# Patient Record
Sex: Female | Born: 1940
Health system: Southern US, Community
[De-identification: ages and names within clinical notes are randomized; demographics above are authoritative.]

## PROBLEM LIST (undated history)

## (undated) DIAGNOSIS — F419 Anxiety disorder, unspecified: Secondary | ICD-10-CM

## (undated) DIAGNOSIS — M8580 Other specified disorders of bone density and structure, unspecified site: Secondary | ICD-10-CM

## (undated) DIAGNOSIS — E785 Hyperlipidemia, unspecified: Secondary | ICD-10-CM

## (undated) DIAGNOSIS — G8929 Other chronic pain: Secondary | ICD-10-CM

## (undated) DIAGNOSIS — K219 Gastro-esophageal reflux disease without esophagitis: Secondary | ICD-10-CM

## (undated) DIAGNOSIS — I219 Acute myocardial infarction, unspecified: Secondary | ICD-10-CM

## (undated) DIAGNOSIS — F32A Depression, unspecified: Secondary | ICD-10-CM

## (undated) DIAGNOSIS — M549 Dorsalgia, unspecified: Secondary | ICD-10-CM

## (undated) DIAGNOSIS — M1990 Unspecified osteoarthritis, unspecified site: Secondary | ICD-10-CM

## (undated) DIAGNOSIS — I10 Essential (primary) hypertension: Secondary | ICD-10-CM

## (undated) DIAGNOSIS — F329 Major depressive disorder, single episode, unspecified: Secondary | ICD-10-CM

## (undated) DIAGNOSIS — I2510 Atherosclerotic heart disease of native coronary artery without angina pectoris: Secondary | ICD-10-CM

## (undated) HISTORY — DX: Atherosclerotic heart disease of native coronary artery without angina pectoris: I25.10

## (undated) HISTORY — DX: Other chronic pain: G89.29

## (undated) HISTORY — DX: Dorsalgia, unspecified: M54.9

## (undated) HISTORY — DX: Gastro-esophageal reflux disease without esophagitis: K21.9

## (undated) HISTORY — DX: Unspecified osteoarthritis, unspecified site: M19.90

## (undated) HISTORY — PX: ECTOPIC PREGNANCY SURGERY: SHX613

## (undated) HISTORY — DX: Essential (primary) hypertension: I10

## (undated) HISTORY — PX: OTHER SURGICAL HISTORY: SHX169

## (undated) HISTORY — DX: Acute myocardial infarction, unspecified: I21.9

## (undated) HISTORY — DX: Hyperlipidemia, unspecified: E78.5

## (undated) HISTORY — DX: Other specified disorders of bone density and structure, unspecified site: M85.80

---
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
## 1998-08-04 ENCOUNTER — Other Ambulatory Visit: Admission: RE | Admit: 1998-08-04 | Discharge: 1998-08-04 | Payer: Self-pay

## 1999-09-07 ENCOUNTER — Other Ambulatory Visit: Admission: RE | Admit: 1999-09-07 | Discharge: 1999-09-07 | Payer: Self-pay | Admitting: Family Medicine

## 2001-07-14 ENCOUNTER — Other Ambulatory Visit: Admission: RE | Admit: 2001-07-14 | Discharge: 2001-07-14 | Payer: Self-pay | Admitting: Family Medicine

## 2003-01-28 ENCOUNTER — Encounter: Payer: Self-pay | Admitting: Family Medicine

## 2003-01-28 ENCOUNTER — Ambulatory Visit (HOSPITAL_COMMUNITY): Admission: RE | Admit: 2003-01-28 | Discharge: 2003-01-28 | Payer: Self-pay | Admitting: Family Medicine

## 2006-03-20 ENCOUNTER — Other Ambulatory Visit: Admission: RE | Admit: 2006-03-20 | Discharge: 2006-03-20 | Payer: Self-pay | Admitting: Family Medicine

## 2010-06-06 ENCOUNTER — Emergency Department (HOSPITAL_COMMUNITY)
Admission: EM | Admit: 2010-06-06 | Discharge: 2010-06-06 | Disposition: A | Discharge status: Home / Self Care | Payer: Medicare Other | Attending: Emergency Medicine | Admitting: Emergency Medicine

## 2010-06-06 DIAGNOSIS — Z203 Contact with and (suspected) exposure to rabies: Secondary | ICD-10-CM | POA: Insufficient documentation

## 2010-06-06 DIAGNOSIS — Z23 Encounter for immunization: Secondary | ICD-10-CM | POA: Insufficient documentation

## 2010-06-06 DIAGNOSIS — Z79899 Other long term (current) drug therapy: Secondary | ICD-10-CM | POA: Insufficient documentation

## 2010-06-06 DIAGNOSIS — I252 Old myocardial infarction: Secondary | ICD-10-CM | POA: Insufficient documentation

## 2010-06-06 DIAGNOSIS — I10 Essential (primary) hypertension: Secondary | ICD-10-CM | POA: Insufficient documentation

## 2010-06-06 DIAGNOSIS — S51809A Unspecified open wound of unspecified forearm, initial encounter: Secondary | ICD-10-CM | POA: Insufficient documentation

## 2010-06-06 DIAGNOSIS — W540XXA Bitten by dog, initial encounter: Secondary | ICD-10-CM | POA: Insufficient documentation

## 2010-06-09 ENCOUNTER — Ambulatory Visit (HOSPITAL_COMMUNITY): Payer: PRIVATE HEALTH INSURANCE

## 2010-06-09 ENCOUNTER — Encounter (HOSPITAL_COMMUNITY): Payer: Medicare Other | Attending: Oncology

## 2010-06-09 DIAGNOSIS — Z23 Encounter for immunization: Secondary | ICD-10-CM | POA: Insufficient documentation

## 2010-06-09 DIAGNOSIS — Z203 Contact with and (suspected) exposure to rabies: Secondary | ICD-10-CM | POA: Insufficient documentation

## 2010-06-13 ENCOUNTER — Ambulatory Visit (HOSPITAL_COMMUNITY): Payer: Medicare Other

## 2010-06-20 ENCOUNTER — Ambulatory Visit (HOSPITAL_COMMUNITY): Payer: Medicare Other

## 2011-05-23 DIAGNOSIS — Z1212 Encounter for screening for malignant neoplasm of rectum: Secondary | ICD-10-CM | POA: Diagnosis not present

## 2011-06-20 DIAGNOSIS — I10 Essential (primary) hypertension: Secondary | ICD-10-CM | POA: Diagnosis not present

## 2011-06-20 DIAGNOSIS — Z5189 Encounter for other specified aftercare: Secondary | ICD-10-CM | POA: Diagnosis not present

## 2011-06-20 DIAGNOSIS — Z9889 Other specified postprocedural states: Secondary | ICD-10-CM | POA: Diagnosis not present

## 2011-06-20 DIAGNOSIS — I252 Old myocardial infarction: Secondary | ICD-10-CM | POA: Diagnosis not present

## 2011-06-20 DIAGNOSIS — I2510 Atherosclerotic heart disease of native coronary artery without angina pectoris: Secondary | ICD-10-CM | POA: Diagnosis not present

## 2011-06-20 DIAGNOSIS — E785 Hyperlipidemia, unspecified: Secondary | ICD-10-CM | POA: Diagnosis not present

## 2011-07-04 DIAGNOSIS — M76899 Other specified enthesopathies of unspecified lower limb, excluding foot: Secondary | ICD-10-CM | POA: Diagnosis not present

## 2011-07-24 DIAGNOSIS — E785 Hyperlipidemia, unspecified: Secondary | ICD-10-CM | POA: Diagnosis not present

## 2011-07-24 DIAGNOSIS — E559 Vitamin D deficiency, unspecified: Secondary | ICD-10-CM | POA: Diagnosis not present

## 2011-07-24 DIAGNOSIS — I10 Essential (primary) hypertension: Secondary | ICD-10-CM | POA: Diagnosis not present

## 2011-08-01 DIAGNOSIS — M76899 Other specified enthesopathies of unspecified lower limb, excluding foot: Secondary | ICD-10-CM | POA: Diagnosis not present

## 2011-09-12 DIAGNOSIS — M76899 Other specified enthesopathies of unspecified lower limb, excluding foot: Secondary | ICD-10-CM | POA: Diagnosis not present

## 2011-10-28 DIAGNOSIS — E785 Hyperlipidemia, unspecified: Secondary | ICD-10-CM | POA: Diagnosis not present

## 2011-10-28 DIAGNOSIS — I10 Essential (primary) hypertension: Secondary | ICD-10-CM | POA: Diagnosis not present

## 2011-10-28 DIAGNOSIS — E559 Vitamin D deficiency, unspecified: Secondary | ICD-10-CM | POA: Diagnosis not present

## 2012-01-06 DIAGNOSIS — K219 Gastro-esophageal reflux disease without esophagitis: Secondary | ICD-10-CM | POA: Diagnosis not present

## 2012-01-06 DIAGNOSIS — R17 Unspecified jaundice: Secondary | ICD-10-CM | POA: Diagnosis not present

## 2012-01-06 DIAGNOSIS — J029 Acute pharyngitis, unspecified: Secondary | ICD-10-CM | POA: Diagnosis not present

## 2012-01-22 DIAGNOSIS — J342 Deviated nasal septum: Secondary | ICD-10-CM | POA: Diagnosis not present

## 2012-01-22 DIAGNOSIS — R1310 Dysphagia, unspecified: Secondary | ICD-10-CM | POA: Diagnosis not present

## 2012-01-22 DIAGNOSIS — K219 Gastro-esophageal reflux disease without esophagitis: Secondary | ICD-10-CM | POA: Diagnosis not present

## 2012-01-22 DIAGNOSIS — R040 Epistaxis: Secondary | ICD-10-CM | POA: Diagnosis not present

## 2012-01-31 DIAGNOSIS — I10 Essential (primary) hypertension: Secondary | ICD-10-CM | POA: Diagnosis not present

## 2012-01-31 DIAGNOSIS — I2510 Atherosclerotic heart disease of native coronary artery without angina pectoris: Secondary | ICD-10-CM | POA: Diagnosis not present

## 2012-01-31 DIAGNOSIS — E785 Hyperlipidemia, unspecified: Secondary | ICD-10-CM | POA: Diagnosis not present

## 2012-01-31 DIAGNOSIS — E559 Vitamin D deficiency, unspecified: Secondary | ICD-10-CM | POA: Diagnosis not present

## 2012-05-05 DIAGNOSIS — I2510 Atherosclerotic heart disease of native coronary artery without angina pectoris: Secondary | ICD-10-CM | POA: Diagnosis not present

## 2012-05-05 DIAGNOSIS — E785 Hyperlipidemia, unspecified: Secondary | ICD-10-CM | POA: Diagnosis not present

## 2012-05-05 DIAGNOSIS — I10 Essential (primary) hypertension: Secondary | ICD-10-CM | POA: Diagnosis not present

## 2012-05-25 DIAGNOSIS — J342 Deviated nasal septum: Secondary | ICD-10-CM | POA: Diagnosis not present

## 2012-05-25 DIAGNOSIS — R040 Epistaxis: Secondary | ICD-10-CM | POA: Diagnosis not present

## 2012-05-27 DIAGNOSIS — Z5320 Procedure and treatment not carried out because of patient's decision for unspecified reasons: Secondary | ICD-10-CM | POA: Diagnosis not present

## 2012-05-27 DIAGNOSIS — R040 Epistaxis: Secondary | ICD-10-CM | POA: Diagnosis not present

## 2012-06-09 DIAGNOSIS — Z1212 Encounter for screening for malignant neoplasm of rectum: Secondary | ICD-10-CM | POA: Diagnosis not present

## 2012-07-05 ENCOUNTER — Other Ambulatory Visit: Payer: Self-pay | Admitting: *Deleted

## 2012-07-05 DIAGNOSIS — M8580 Other specified disorders of bone density and structure, unspecified site: Secondary | ICD-10-CM

## 2012-07-22 DIAGNOSIS — I498 Other specified cardiac arrhythmias: Secondary | ICD-10-CM | POA: Diagnosis not present

## 2012-07-22 DIAGNOSIS — I252 Old myocardial infarction: Secondary | ICD-10-CM | POA: Diagnosis not present

## 2012-07-22 DIAGNOSIS — E785 Hyperlipidemia, unspecified: Secondary | ICD-10-CM | POA: Diagnosis not present

## 2012-07-22 DIAGNOSIS — I2510 Atherosclerotic heart disease of native coronary artery without angina pectoris: Secondary | ICD-10-CM | POA: Diagnosis not present

## 2012-07-22 DIAGNOSIS — I119 Hypertensive heart disease without heart failure: Secondary | ICD-10-CM | POA: Diagnosis not present

## 2012-07-22 DIAGNOSIS — I10 Essential (primary) hypertension: Secondary | ICD-10-CM | POA: Diagnosis not present

## 2012-07-31 ENCOUNTER — Other Ambulatory Visit: Payer: Self-pay | Admitting: *Deleted

## 2012-07-31 MED ORDER — HYDROCODONE-ACETAMINOPHEN 5-325 MG PO TABS: ORAL_TABLET | ORAL | Status: DC

## 2012-07-31 NOTE — Telephone Encounter (Signed)
RX ready for pick up but cannot be filled till 08/07/12

## 2012-07-31 NOTE — Telephone Encounter (Signed)
PT AWARE RX UP FRONT

## 2012-07-31 NOTE — Telephone Encounter (Signed)
Patient last seen on 05-05-12. Rx last filled on 07-08-12. Please advise. If approved please print and have pt pick up. Pts number is [phone]. Thank you

## 2012-08-04 ENCOUNTER — Ambulatory Visit: Payer: Self-pay | Admitting: Nurse Practitioner

## 2012-08-10 ENCOUNTER — Encounter: Payer: Self-pay | Admitting: Nurse Practitioner

## 2012-08-10 ENCOUNTER — Ambulatory Visit (INDEPENDENT_AMBULATORY_CARE_PROVIDER_SITE_OTHER): Payer: Medicare Other | Admitting: Nurse Practitioner

## 2012-08-10 ENCOUNTER — Encounter: Payer: Self-pay | Admitting: *Deleted

## 2012-08-10 VITALS — BP 141/81 | HR 57 | Temp 98.8°F | Ht 65.0 in | Wt 165.5 lb

## 2012-08-10 DIAGNOSIS — F329 Major depressive disorder, single episode, unspecified: Secondary | ICD-10-CM | POA: Diagnosis not present

## 2012-08-10 DIAGNOSIS — E785 Hyperlipidemia, unspecified: Secondary | ICD-10-CM | POA: Diagnosis not present

## 2012-08-10 DIAGNOSIS — K219 Gastro-esophageal reflux disease without esophagitis: Secondary | ICD-10-CM | POA: Diagnosis not present

## 2012-08-10 DIAGNOSIS — I10 Essential (primary) hypertension: Secondary | ICD-10-CM

## 2012-08-10 LAB — COMPLETE METABOLIC PANEL WITH GFR
Albumin: 4 g/dL (ref 3.5–5.2)
BUN: 13 mg/dL (ref 6–23)
Calcium: 9.5 mg/dL (ref 8.4–10.5)
Chloride: 105 mEq/L (ref 96–112)
Creat: 0.76 mg/dL (ref 0.50–1.10)
GFR, Est Non African American: 80 mL/min
Glucose, Bld: 95 mg/dL (ref 70–99)
Potassium: 4.1 mEq/L (ref 3.5–5.3)

## 2012-08-10 NOTE — Progress Notes (Signed)
Subjective:    Patient ID: Teresa Holloway, female    DOB: Dec 16, 1940, 72 y.o.   MRN: [id number]  Hypertension This is a chronic problem. The current episode started more than 1 year ago. The problem is unchanged. The problem is controlled. Pertinent negatives include no blurred vision, chest pain, headaches, palpitations, peripheral edema or shortness of breath. There are no associated agents to hypertension. Risk factors for coronary artery disease include dyslipidemia, family history and post-menopausal state. Past treatments include calcium channel blockers, ACE inhibitors and beta blockers. The current treatment provides moderate improvement. Compliance problems include exercise.  Hypertensive end-organ damage includes CAD/MI (2004 and 2007).  Hyperlipidemia This is a chronic problem. The current episode started more than 1 year ago. The problem is controlled. Recent lipid tests were reviewed and are normal. Pertinent negatives include no chest pain, focal weakness, leg pain, myalgias or shortness of breath. Current antihyperlipidemic treatment includes statins. The current treatment provides moderate improvement of lipids. Compliance problems include adherence to exercise.  Risk factors for coronary artery disease include hypertension and post-menopausal.  Gastrophageal Reflux She reports no chest pain, no coughing, no early satiety, no heartburn, no nausea or no sore throat. This is a chronic problem. The current episode started more than 1 year ago. The problem occurs rarely. The problem has been resolved. The symptoms are aggravated by caffeine. Pertinent negatives include no fatigue, orthopnea or weight loss. There are no known risk factors. She has tried a PPI for the symptoms. The treatment provided no relief.  Depression Currently doing well on Paxil. Stays active and on the go all the time. Denies any crying episodes. Hx CAD/MI On plavix- denies any bleeding problems. See Cardiologist  yearly   Review of Systems  Constitutional: Negative for weight loss and fatigue.  HENT: Negative for sore throat.   Eyes: Negative for blurred vision.  Respiratory: Negative for cough and shortness of breath.   Cardiovascular: Negative for chest pain and palpitations.  Gastrointestinal: Negative for heartburn and nausea.  Musculoskeletal: Negative for myalgias.  Neurological: Negative for focal weakness and headaches.       Objective:   Physical Exam  Constitutional: She is oriented to person, place, and time. She appears well-developed and well-nourished.  HENT:  Nose: Nose normal.  Mouth/Throat: Oropharynx is clear and moist.  Eyes: EOM are normal.  Neck: Trachea normal, normal range of motion and full passive range of motion without pain. Neck supple. No JVD present. Carotid bruit is not present. No thyromegaly present.  Cardiovascular: Normal rate, regular rhythm, normal heart sounds and intact distal pulses.  Exam reveals no gallop and no friction rub.   No murmur heard. Pulmonary/Chest: Effort normal and breath sounds normal.  Abdominal: Soft. Bowel sounds are normal. She exhibits no distension and no mass. There is no tenderness.  Musculoskeletal: Normal range of motion.  Lymphadenopathy:    She has no cervical adenopathy.  Neurological: She is alert and oriented to person, place, and time. She has normal reflexes.  Skin: Skin is warm and dry.  Psychiatric: She has a normal mood and affect. Her behavior is normal. Judgment and thought content normal.   BP 141/81  Pulse 57  Temp(Src) 98.8 F (37.1 C) (Oral)  Ht 5\' 5"  (1.651 m)  Wt 165 lb 8 oz (75.07 kg)  BMI 27.54 kg/m2        Assessment & Plan:  1. Essential hypertension, benign Low NA+ diet  COMPLETE METABOLIC PANEL WITH GFR  2. Hyperlipidemia with  target LDL less than 100 Low fat diet and exercise - NMR Lipoprofile with Lipids  3. GERD (gastroesophageal reflux disease) Avoid spicy and fatty foods when  symptomatic  4. Depression Stress management discuessed  Continue all meds Will call with lab results Mary-Margaret Daphine Deutscher, FNP

## 2012-08-10 NOTE — Patient Instructions (Signed)

## 2012-08-11 LAB — NMR LIPOPROFILE WITH LIPIDS
HDL Size: 9.8 nm (ref >=9.2)
LDL (calc): 60 mg/dL (ref <100)
LDL Particle Number: 688 nmol/L (ref <1000)
LDL Size: 20.4 nm — ABNORMAL LOW (ref >20.5)
LP-IR Score: <25 (ref <=45)
Large VLDL-P: <0.8 nmol/L (ref <=2.7)
Small LDL Particle Number: 259 nmol/L (ref <=527)
VLDL Size: 44.7 nm (ref <=46.6)

## 2012-08-19 ENCOUNTER — Ambulatory Visit (INDEPENDENT_AMBULATORY_CARE_PROVIDER_SITE_OTHER): Payer: Medicare Other

## 2012-08-19 ENCOUNTER — Ambulatory Visit (INDEPENDENT_AMBULATORY_CARE_PROVIDER_SITE_OTHER): Payer: Medicare Other | Admitting: Pharmacist

## 2012-08-19 VITALS — Ht 64.5 in | Wt 169.0 lb

## 2012-08-19 DIAGNOSIS — I2510 Atherosclerotic heart disease of native coronary artery without angina pectoris: Secondary | ICD-10-CM | POA: Diagnosis not present

## 2012-08-19 DIAGNOSIS — M949 Disorder of cartilage, unspecified: Secondary | ICD-10-CM

## 2012-08-19 DIAGNOSIS — I252 Old myocardial infarction: Secondary | ICD-10-CM | POA: Diagnosis not present

## 2012-08-19 DIAGNOSIS — M899 Disorder of bone, unspecified: Secondary | ICD-10-CM | POA: Diagnosis not present

## 2012-08-19 DIAGNOSIS — M1610 Unilateral primary osteoarthritis, unspecified hip: Secondary | ICD-10-CM

## 2012-08-19 DIAGNOSIS — M170 Bilateral primary osteoarthritis of knee: Secondary | ICD-10-CM | POA: Insufficient documentation

## 2012-08-19 DIAGNOSIS — M8580 Other specified disorders of bone density and structure, unspecified site: Secondary | ICD-10-CM

## 2012-08-19 DIAGNOSIS — E785 Hyperlipidemia, unspecified: Secondary | ICD-10-CM

## 2012-08-19 NOTE — Progress Notes (Signed)
Patient ID: Teresa Holloway, female   DOB: 1941-01-30, 72 y.o.   MRN: [id number]  [facility] Current Height: Height: 5' 4.5" (163.8 cm)      Max Lifetime Height:  5\' 6"  Current Weight: Weight: 169 lb (76.658 kg)       Ethnicity:Caucasian     HPI: Does pt already have a diagnosis of:  Osteopenia?  Yes Osteoporosis?  No  Back Pain?  Yes       Kyphosis?  No Prior fracture?  Yes - wrist 2-3 years ago Med(s) for Osteoporosis/Osteopenia:  none Med(s) previously tried for Osteoporosis/Osteopenia:  actonel - patient didn't continue to take because of concern for ONJ risk.  Patient also reports difficulty swallowing her glucosamine/chondroitin and has discontinued.                                                             PMH: Age at menopause:  72 years old Hysterectomy?  No Oophorectomy?  No HRT? Yes - Former.  Type/duration: premarin - for 5 to 6 years Steroid Use?  No Thyroid med?  No History of cancer?  No History of digestive disorders (ie Crohn's)?  No Current or previous eating disorders?  Yes - takes zantac 75mg  daily for reflux Last Vitamin D Result:  44 (01/31/2012) Last GFR Result:  80 (08/10/2012)   FH/SH: Family history of osteoporosis?  Yes  - brother Parent with history of hip fracture?  No Family history of breast cancer?  No Exercise?  Yes - walking daily  Smoking?  No Alcohol?  No    Calcium Assessment Calcium Intake  # of servings/day  Calcium mg  Milk (8 oz) 1  x  300  = 300mg   Yogurt (4 oz) 1 x  200 = 200mg   Cheese (1 oz) 0 x  200 = 0  Other Calcium sources   250mg   Ca supplement none = 0   Estimated calcium intake per day 750mg     DEXA Results Date of Test T-Score for AP Spine L1-L4 T-Score for Total Left Hip T-Score for Total Right Hip  08/09/3012 -1.3 -2.4 -2.3  07/06/2008 -1.0 -2.1 -1.8       08/09/2012** Neck of L hip T-Score = -2.8     Assessment: Osteoporosis with worsening BMD and non compliance with previous recommended  therapy and also has low calcium intake.  Recommendations: 1.  Start  Prolia 60mg  SQ Q 6 months - looking into insurance coverage.  Ins determination sent today - pending 2.  recommend calcium 1200mg  daily either through supplementation   or diet.   3.  recommend weight bearing exercise - 30 minutes at least 4 days   per week.   4.  Counseled and educated about fall risk and prevention.  Recheck DEXA:  2 years  Time spent counseling patient:  30 minutes

## 2012-08-19 NOTE — Patient Instructions (Addendum)

## 2012-08-25 ENCOUNTER — Other Ambulatory Visit: Payer: Self-pay | Admitting: Pharmacist

## 2012-08-25 ENCOUNTER — Telehealth: Payer: Self-pay | Admitting: Pharmacist

## 2012-08-25 NOTE — Telephone Encounter (Signed)
Yes I will order it today and let you know when it arrives

## 2012-08-25 NOTE — Progress Notes (Signed)
Patient ID: Teresa Holloway, female   DOB: 04/27/1940, 72 y.o.   MRN: [id number]  Received Summary of Benefits from [employer] - Prolia would be covered at 100% with no out of pocket for patient.  Will notify patient, order medication and set up appointment for administration of Prolia.

## 2012-08-25 NOTE — Telephone Encounter (Signed)
Patient notified that Prolia is covered 100% by insurance per [employer].  Appt schedule for administration 09/09/12. Will get Ardine Eng to order.

## 2012-08-27 NOTE — Telephone Encounter (Signed)
Prolia has arrived and in the lab refridgerator

## 2012-09-02 ENCOUNTER — Other Ambulatory Visit: Payer: Self-pay | Admitting: Nurse Practitioner

## 2012-09-03 NOTE — Telephone Encounter (Signed)
rx ready for pickup 

## 2012-09-03 NOTE — Telephone Encounter (Signed)
Last seen 08/10/12, last filled 08/07/12. call [phone] for pickup

## 2012-09-04 ENCOUNTER — Telehealth: Payer: Self-pay | Admitting: Nurse Practitioner

## 2012-09-04 NOTE — Telephone Encounter (Signed)
Up front  - pt aware  

## 2012-09-04 NOTE — Telephone Encounter (Signed)
LMOM at [facility] and with pt number, RX here ready for pk up.

## 2012-09-09 ENCOUNTER — Ambulatory Visit (INDEPENDENT_AMBULATORY_CARE_PROVIDER_SITE_OTHER): Payer: Medicare Other | Admitting: Pharmacist

## 2012-09-09 DIAGNOSIS — E559 Vitamin D deficiency, unspecified: Secondary | ICD-10-CM

## 2012-09-09 DIAGNOSIS — M810 Age-related osteoporosis without current pathological fracture: Secondary | ICD-10-CM | POA: Diagnosis not present

## 2012-09-09 MED ORDER — DENOSUMAB 60 MG/ML ~~LOC~~ SOLN
60.0000 mg | Freq: Once | SUBCUTANEOUS | Status: AC
  Administered 2012-09-09: 60 mg via SUBCUTANEOUS

## 2012-09-09 NOTE — Progress Notes (Signed)
Patient is here for first Prolia injection.      HPI: Last DEXA showed decreased BMD which now indicated osteoporosis. H/O wrist fracture 2-3 years ago  Med(s) previously tried  actonel but patient didn't continue to take because of concern for ONJ risk. Patient also reports difficulty swallowing her glucosamine/chondroitin and has discontinued.                                                                   DEXA Results Date of Test T-Score for AP Spine L1-L4 T-Score for Total Left Hip T-Score for Total Right Hip  08/09/3012 -1.3 -2.4 -2.3  07/06/2008 -1.0 -2.1 -1.8       08/09/2012** Neck of L hip T-Score = -2.8     Assessment: Osteoporosis   Recommendations: 1.  Prolia 60mg /mL injection given SQ today 2. Continue calcium 1200mg  daily either through supplementation   or diet.   3.  recommend weight bearing exercise - 30 minutes at least 4 days   per week.   4.  Counseled and educated about fall risk and prevention.  Recheck DEXA:  2 years  Time spent counseling patient:  15 minutes

## 2012-10-01 ENCOUNTER — Other Ambulatory Visit: Payer: Self-pay | Admitting: Nurse Practitioner

## 2012-10-05 NOTE — Telephone Encounter (Signed)
Aware. 

## 2012-10-05 NOTE — Telephone Encounter (Signed)
Last seen 08/10/12, last filled 09/02/12. Call pt to pickup rx

## 2012-10-05 NOTE — Telephone Encounter (Signed)
rx ready for pickup 

## 2012-11-02 ENCOUNTER — Telehealth: Payer: Self-pay | Admitting: Nurse Practitioner

## 2012-11-02 ENCOUNTER — Other Ambulatory Visit: Payer: Self-pay | Admitting: Nurse Practitioner

## 2012-11-02 MED ORDER — HYDROCODONE-ACETAMINOPHEN 5-325 MG PO TABS: 1.0000 | ORAL_TABLET | Freq: Four times a day (QID) | ORAL | Status: DC | PRN

## 2012-11-02 NOTE — Telephone Encounter (Signed)
rx ready for pickup 

## 2012-11-02 NOTE — Telephone Encounter (Signed)
Please advise 

## 2012-11-02 NOTE — Telephone Encounter (Signed)
Pt aware - rx up front 

## 2012-11-03 ENCOUNTER — Other Ambulatory Visit: Payer: Self-pay | Admitting: Nurse Practitioner

## 2012-11-17 ENCOUNTER — Ambulatory Visit (INDEPENDENT_AMBULATORY_CARE_PROVIDER_SITE_OTHER): Payer: Medicare Other | Admitting: Nurse Practitioner

## 2012-11-17 ENCOUNTER — Encounter: Payer: Self-pay | Admitting: Nurse Practitioner

## 2012-11-17 VITALS — BP 117/71 | HR 56 | Temp 97.2°F | Ht 64.0 in | Wt 169.0 lb

## 2012-11-17 DIAGNOSIS — E559 Vitamin D deficiency, unspecified: Secondary | ICD-10-CM | POA: Diagnosis not present

## 2012-11-17 DIAGNOSIS — I10 Essential (primary) hypertension: Secondary | ICD-10-CM

## 2012-11-17 DIAGNOSIS — I2510 Atherosclerotic heart disease of native coronary artery without angina pectoris: Secondary | ICD-10-CM

## 2012-11-17 DIAGNOSIS — I252 Old myocardial infarction: Secondary | ICD-10-CM | POA: Diagnosis not present

## 2012-11-17 DIAGNOSIS — M170 Bilateral primary osteoarthritis of knee: Secondary | ICD-10-CM

## 2012-11-17 DIAGNOSIS — M810 Age-related osteoporosis without current pathological fracture: Secondary | ICD-10-CM

## 2012-11-17 DIAGNOSIS — M1610 Unilateral primary osteoarthritis, unspecified hip: Secondary | ICD-10-CM | POA: Diagnosis not present

## 2012-11-17 DIAGNOSIS — E785 Hyperlipidemia, unspecified: Secondary | ICD-10-CM

## 2012-11-17 DIAGNOSIS — K219 Gastro-esophageal reflux disease without esophagitis: Secondary | ICD-10-CM

## 2012-11-17 DIAGNOSIS — F329 Major depressive disorder, single episode, unspecified: Secondary | ICD-10-CM

## 2012-11-17 NOTE — Patient Instructions (Signed)

## 2012-11-17 NOTE — Progress Notes (Signed)
Subjective:    Patient ID: Teresa Holloway, female    DOB: [birth year], 72 y.o.   MRN: [id number]  Hypertension This is a chronic problem. The current episode started more than 1 year ago. The problem has been resolved since onset. The problem is controlled. Associated symptoms include palpitations and peripheral edema. Pertinent negatives include no blurred vision, chest pain, headaches, neck pain or shortness of breath. There are no associated agents to hypertension. Risk factors for coronary artery disease include dyslipidemia and post-menopausal state. Past treatments include calcium channel blockers, ACE inhibitors and beta blockers. The current treatment provides moderate improvement. Compliance problems include diet.  Hypertensive end-organ damage includes CAD/MI.  Hyperlipidemia This is a chronic problem. The current episode started more than 1 year ago. The problem is uncontrolled. Recent lipid tests were reviewed and are high. She has no history of diabetes, hypothyroidism, obesity or nephrotic syndrome. There are no known factors aggravating her hyperlipidemia. Pertinent negatives include no chest pain or shortness of breath. Current antihyperlipidemic treatment includes statins. The current treatment provides moderate improvement of lipids. Compliance problems include adherence to diet.  Risk factors for coronary artery disease include hypertension.  CAD/MI Currently on Imdurr - no C/O chest pain or SOB or Dyspnea- Sees Dr. Dorena Cookey sees yearly and just had follow-up in April. Depression Paxil daily  Which is working well for her- no c/o side effects Vitamin D deficiency  Vitamin D OTC- No side effects Osteoarthritis bil knees and hips- takes hydrocodone BID and says that can't walk without it due to the pain. GERD Zantac working well to keep symptoms under control.   Review of Systems  HENT: Negative for neck pain.   Eyes: Negative for blurred vision.  Respiratory: Negative for  shortness of breath.   Cardiovascular: Positive for palpitations. Negative for chest pain.  Neurological: Negative for headaches.  All other systems reviewed and are negative.       Objective:   Physical Exam  Constitutional: She is oriented to person, place, and time. She appears well-developed and well-nourished.  HENT:  Nose: Nose normal.  Mouth/Throat: Oropharynx is clear and moist.  Eyes: EOM are normal.  Neck: Trachea normal, normal range of motion and full passive range of motion without pain. Neck supple. No JVD present. Carotid bruit is not present. No thyromegaly present.  Cardiovascular: Normal rate, regular rhythm, normal heart sounds and intact distal pulses.  Exam reveals no gallop and no friction rub.   No murmur heard. Pulmonary/Chest: Effort normal and breath sounds normal.  Abdominal: Soft. Bowel sounds are normal. She exhibits no distension and no mass. There is no tenderness.  Musculoskeletal: Normal range of motion.  Lymphadenopathy:    She has no cervical adenopathy.  Neurological: She is alert and oriented to person, place, and time. She has normal reflexes.  Skin: Skin is warm and dry.  Psychiatric: She has a normal mood and affect. Her behavior is normal. Judgment and thought content normal.     BP 117/71  Pulse 56  Temp(Src) 97.2 F (36.2 C) (Oral)  Ht 5\' 4"  (1.626 m)  Wt 169 lb (76.658 kg)  BMI 28.99 kg/m2      Assessment & Plan:   1. Unspecified vitamin D deficiency   2. CAD (coronary artery disease)   3. History of MI (myocardial infarction)   4. Arthritis of hip   5. Arthritis of both knees   6. Osteoporosis   7. Hyperlipidemia with target LDL less than 100   8.  Essential hypertension, benign   9. GERD (gastroesophageal reflux disease)   10. Depression    Orders Placed This Encounter  Procedures  . CMP14+EGFR  . NMR, lipoprofile   Continue all meds Labs pending Diet and exercsie encouraged Reviewed health maintenance- refused  mammo, pneumovax and zostavax. Follow up in 3 months  Mary-Margaret Daphine Deutscher, FNP

## 2012-11-19 LAB — CMP14+EGFR
AST: 9 IU/L (ref 0–40)
Albumin/Globulin Ratio: 1.6 (ref 1.1–2.5)
Albumin: 4 g/dL (ref 3.5–4.8)
Alkaline Phosphatase: 80 IU/L (ref 39–117)
BUN/Creatinine Ratio: 20 (ref 11–26)
BUN: 14 mg/dL (ref 8–27)
CO2: 28 mmol/L (ref 18–29)
Calcium: 8.8 mg/dL (ref 8.6–10.2)
Creatinine, Ser: 0.7 mg/dL (ref 0.57–1.00)
GFR calc non Af Amer: 88 mL/min/{1.73_m2} (ref >59)
Globulin, Total: 2.5 g/dL (ref 1.5–4.5)
Sodium: 141 mmol/L (ref 134–144)

## 2012-11-19 LAB — NMR, LIPOPROFILE
Cholesterol: 135 mg/dL (ref <200)
HDL Cholesterol by NMR: 60 mg/dL (ref >=40)
HDL Particle Number: 37.3 umol/L (ref >=30.5)
LDLC SERPL CALC-MCNC: 56 mg/dL (ref <100)
Small LDL Particle Number: 398 nmol/L (ref <=527)

## 2012-12-02 ENCOUNTER — Other Ambulatory Visit: Payer: Self-pay | Admitting: Nurse Practitioner

## 2012-12-03 DIAGNOSIS — H2510 Age-related nuclear cataract, unspecified eye: Secondary | ICD-10-CM | POA: Diagnosis not present

## 2012-12-03 DIAGNOSIS — H40019 Open angle with borderline findings, low risk, unspecified eye: Secondary | ICD-10-CM | POA: Diagnosis not present

## 2012-12-03 DIAGNOSIS — H268 Other specified cataract: Secondary | ICD-10-CM | POA: Diagnosis not present

## 2012-12-03 DIAGNOSIS — H5210 Myopia, unspecified eye: Secondary | ICD-10-CM | POA: Diagnosis not present

## 2012-12-04 NOTE — Telephone Encounter (Signed)
Last seen 11/17/12, last filled 11/02/12. Will print, call pt to pickup

## 2012-12-04 NOTE — Telephone Encounter (Signed)
rx ready for pickup 

## 2012-12-04 NOTE — Telephone Encounter (Signed)
Up front 

## 2013-01-05 ENCOUNTER — Other Ambulatory Visit: Payer: Self-pay | Admitting: Nurse Practitioner

## 2013-01-06 MED ORDER — HYDROCODONE-ACETAMINOPHEN 5-325 MG PO TABS: ORAL_TABLET | ORAL | Status: DC

## 2013-01-06 NOTE — Telephone Encounter (Signed)
Up front ready to pick up  

## 2013-01-06 NOTE — Telephone Encounter (Signed)
Last seen 11/17/12, last filled 12/02/12

## 2013-01-06 NOTE — Telephone Encounter (Signed)
rx ready for pickup 

## 2013-01-15 ENCOUNTER — Encounter: Payer: Self-pay | Admitting: General Practice

## 2013-01-15 ENCOUNTER — Ambulatory Visit (INDEPENDENT_AMBULATORY_CARE_PROVIDER_SITE_OTHER): Payer: Medicare Other | Admitting: General Practice

## 2013-01-15 VITALS — BP 151/80 | HR 55 | Temp 98.7°F | Ht 64.0 in | Wt 162.0 lb

## 2013-01-15 DIAGNOSIS — N390 Urinary tract infection, site not specified: Secondary | ICD-10-CM

## 2013-01-15 DIAGNOSIS — R350 Frequency of micturition: Secondary | ICD-10-CM

## 2013-01-15 DIAGNOSIS — IMO0001 Reserved for inherently not codable concepts without codable children: Secondary | ICD-10-CM

## 2013-01-15 LAB — POCT UA - MICROSCOPIC ONLY
Bacteria, U Microscopic: NEGATIVE
Casts, Ur, LPF, POC: NEGATIVE
Crystals, Ur, HPF, POC: NEGATIVE
Yeast, UA: NEGATIVE

## 2013-01-15 LAB — POCT URINALYSIS DIPSTICK
Bilirubin, UA: NEGATIVE
Ketones, UA: NEGATIVE
Protein, UA: 100
Spec Grav, UA: 1.01
pH, UA: 7

## 2013-01-15 MED ORDER — CIPROFLOXACIN HCL 500 MG PO TABS: 500.0000 mg | ORAL_TABLET | Freq: Two times a day (BID) | ORAL | Status: DC

## 2013-01-15 NOTE — Patient Instructions (Addendum)
Urinary Tract Infection  Urinary tract infections (UTIs) can develop anywhere along your urinary tract. Your urinary tract is your body's drainage system for removing wastes and extra water. Your urinary tract includes two kidneys, two ureters, a bladder, and a urethra. Your kidneys are a pair of bean-shaped organs. Each kidney is about the size of your fist. They are located below your ribs, one on each side of your spine.  CAUSES  Infections are caused by microbes, which are microscopic organisms, including fungi, viruses, and bacteria. These organisms are so small that they can only be seen through a microscope. Bacteria are the microbes that most commonly cause UTIs.  SYMPTOMS   Symptoms of UTIs may vary by age and gender of the patient and by the location of the infection. Symptoms in young women typically include a frequent and intense urge to urinate and a painful, burning feeling in the bladder or urethra during urination. Older women and men are more likely to be tired, shaky, and weak and have muscle aches and abdominal pain. A fever may mean the infection is in your kidneys. Other symptoms of a kidney infection include pain in your back or sides below the ribs, nausea, and vomiting.  DIAGNOSIS  To diagnose a UTI, your caregiver will ask you about your symptoms. Your caregiver also will ask to provide a urine sample. The urine sample will be tested for bacteria and white blood cells. White blood cells are made by your body to help fight infection.  TREATMENT   Typically, UTIs can be treated with medication. Because most UTIs are caused by a bacterial infection, they usually can be treated with the use of antibiotics. The choice of antibiotic and length of treatment depend on your symptoms and the type of bacteria causing your infection.  HOME CARE INSTRUCTIONS   If you were prescribed antibiotics, take them exactly as your caregiver instructs you. Finish the medication even if you feel better after you  have only taken some of the medication.   Drink enough water and fluids to keep your urine clear or pale yellow.   Avoid caffeine, tea, and carbonated beverages. They tend to irritate your bladder.   Empty your bladder often. Avoid holding urine for long periods of time.   Empty your bladder before and after sexual intercourse.   After a bowel movement, women should cleanse from front to back. Use each tissue only once.  SEEK MEDICAL CARE IF:    You have back pain.   You develop a fever.   Your symptoms do not begin to resolve within 3 days.  SEEK IMMEDIATE MEDICAL CARE IF:    You have severe back pain or lower abdominal pain.   You develop chills.   You have nausea or vomiting.   You have continued burning or discomfort with urination.  MAKE SURE YOU:    Understand these instructions.   Will watch your condition.   Will get help right away if you are not doing well or get worse.  Document Released: 01/16/2005 Document Revised: 10/08/2011 Document Reviewed: 05/17/2011  [employer] Patient Information 2014 [employer]

## 2013-01-15 NOTE — Progress Notes (Signed)
Subjective:    Patient ID: Teresa Holloway, female    DOB: 1940/11/19, 72 y.o.   MRN: [id number]  Urinary Tract Infection  This is a new problem. The current episode started in the past 7 days. The problem occurs every urination. The problem has been gradually worsening. The quality of the pain is described as aching and burning. There has been no fever. She is sexually active. There is no history of pyelonephritis. Associated symptoms include flank pain, frequency and urgency. Pertinent negatives include no chills, hematuria, nausea or vomiting. She has tried nothing for the symptoms. There is no history of kidney stones or recurrent UTIs.      Review of Systems  Constitutional: Negative for fever and chills.  Cardiovascular: Negative for chest pain and palpitations.  Gastrointestinal: Positive for abdominal pain. Negative for nausea, vomiting and blood in stool.  Genitourinary: Positive for dysuria, urgency, frequency and flank pain. Negative for hematuria and difficulty urinating.  Skin: Negative for rash.  Neurological: Negative for dizziness, weakness and headaches.       Objective:   Physical Exam  Constitutional: She is oriented to person, place, and time. She appears well-developed and well-nourished.  Cardiovascular: Normal rate, regular rhythm and normal heart sounds.   Pulmonary/Chest: Effort normal and breath sounds normal.  Abdominal: Soft. Bowel sounds are normal. She exhibits no distension. There is tenderness in the suprapubic area. There is no CVA tenderness.  Neurological: She is alert and oriented to person, place, and time.  Skin: Skin is warm and dry.  Psychiatric: She has a normal mood and affect.    Results for orders placed in visit on 01/15/13  POCT URINALYSIS DIPSTICK      Result Value Range   Color, UA yellow     Clarity, UA cloudy     Glucose, UA neg     Bilirubin, UA neg     Ketones, UA neg     Spec Grav, UA 1.010     Blood, UA large     pH, UA  7.0     Protein, UA 100     Urobilinogen, UA negative     Nitrite, UA neg     Leukocytes, UA large (3+)    POCT UA - MICROSCOPIC ONLY      Result Value Range   WBC, Ur, HPF, POC 75-100     RBC, urine, microscopic 20-25     Bacteria, U Microscopic neg     Mucus, UA occ     Epithelial cells, urine per micros occ     Crystals, Ur, HPF, POC neg     Casts, Ur, LPF, POC neg     Yeast, UA neg           Assessment & Plan:  1. Frequency - POCT urinalysis dipstick - POCT UA - Microscopic Only  2. UTI (urinary tract infection) - ciprofloxacin (CIPRO) 500 MG tablet; Take 1 tablet (500 mg total) by mouth 2 (two) times daily.  Dispense: 20 tablet; Refill: 0 -Increase fluid intake AZO over the counter X2 days Frequent voiding Proper perineal hygiene RTO prn Patient verbalized understanding Coralie Keens, FNP-C

## 2013-01-17 LAB — URINE CULTURE

## 2013-01-18 ENCOUNTER — Other Ambulatory Visit: Payer: Self-pay | Admitting: Nurse Practitioner

## 2013-02-03 ENCOUNTER — Other Ambulatory Visit: Payer: Self-pay | Admitting: Nurse Practitioner

## 2013-02-04 MED ORDER — HYDROCODONE-ACETAMINOPHEN 5-325 MG PO TABS: ORAL_TABLET | ORAL | Status: DC

## 2013-02-04 NOTE — Telephone Encounter (Signed)
Last filled 01/06/13, last seen 11/17/12. Rx will print call her when ready

## 2013-02-04 NOTE — Telephone Encounter (Signed)
rx ready for pickup 

## 2013-02-05 NOTE — Telephone Encounter (Signed)
Up front patient aware 

## 2013-02-17 DIAGNOSIS — H401490 Capsular glaucoma with pseudoexfoliation of lens, unspecified eye, stage unspecified: Secondary | ICD-10-CM | POA: Diagnosis not present

## 2013-02-17 DIAGNOSIS — H409 Unspecified glaucoma: Secondary | ICD-10-CM | POA: Diagnosis not present

## 2013-02-23 ENCOUNTER — Ambulatory Visit: Payer: PRIVATE HEALTH INSURANCE | Admitting: Nurse Practitioner

## 2013-03-08 ENCOUNTER — Telehealth: Payer: Self-pay | Admitting: Nurse Practitioner

## 2013-03-08 ENCOUNTER — Telehealth: Payer: Self-pay | Admitting: Pharmacist

## 2013-03-08 MED ORDER — HYDROCODONE-ACETAMINOPHEN 5-325 MG PO TABS: ORAL_TABLET | ORAL | Status: DC

## 2013-03-08 NOTE — Telephone Encounter (Signed)
Patient aware rx ready to pick up 

## 2013-03-08 NOTE — Telephone Encounter (Signed)
rx ready for pickup 

## 2013-03-15 NOTE — Telephone Encounter (Signed)
Patient is concerned about ONJ with Prolia.  She has appt with her dentist 12/15 /14 and would like to discuss with him.  Will follow up with patient after that appt.

## 2013-04-05 ENCOUNTER — Telehealth: Payer: Self-pay | Admitting: Nurse Practitioner

## 2013-04-05 MED ORDER — HYDROCODONE-ACETAMINOPHEN 5-325 MG PO TABS: ORAL_TABLET | ORAL | Status: DC

## 2013-04-05 NOTE — Telephone Encounter (Signed)
rx ready for pickup 

## 2013-04-06 NOTE — Telephone Encounter (Signed)
Patient aware to come pick up 

## 2013-04-19 ENCOUNTER — Other Ambulatory Visit: Payer: Self-pay | Admitting: Nurse Practitioner

## 2013-05-05 ENCOUNTER — Telehealth: Payer: Self-pay | Admitting: Nurse Practitioner

## 2013-05-05 MED ORDER — HYDROCODONE-ACETAMINOPHEN 5-325 MG PO TABS: ORAL_TABLET | ORAL | Status: DC

## 2013-05-05 NOTE — Telephone Encounter (Signed)
rx ready for pickup 

## 2013-05-05 NOTE — Telephone Encounter (Signed)
Pt notified rx ready for pick up.

## 2013-05-20 DIAGNOSIS — H401490 Capsular glaucoma with pseudoexfoliation of lens, unspecified eye, stage unspecified: Secondary | ICD-10-CM | POA: Diagnosis not present

## 2013-05-20 DIAGNOSIS — H409 Unspecified glaucoma: Secondary | ICD-10-CM | POA: Diagnosis not present

## 2013-05-26 ENCOUNTER — Ambulatory Visit (INDEPENDENT_AMBULATORY_CARE_PROVIDER_SITE_OTHER): Payer: Medicare Other | Admitting: Nurse Practitioner

## 2013-05-26 ENCOUNTER — Encounter: Payer: Self-pay | Admitting: Nurse Practitioner

## 2013-05-26 VITALS — BP 126/56 | HR 55 | Temp 99.0°F | Ht 64.0 in | Wt 174.0 lb

## 2013-05-26 DIAGNOSIS — I10 Essential (primary) hypertension: Secondary | ICD-10-CM

## 2013-05-26 DIAGNOSIS — M899 Disorder of bone, unspecified: Secondary | ICD-10-CM

## 2013-05-26 DIAGNOSIS — K219 Gastro-esophageal reflux disease without esophagitis: Secondary | ICD-10-CM

## 2013-05-26 DIAGNOSIS — R350 Frequency of micturition: Secondary | ICD-10-CM

## 2013-05-26 DIAGNOSIS — I2510 Atherosclerotic heart disease of native coronary artery without angina pectoris: Secondary | ICD-10-CM

## 2013-05-26 DIAGNOSIS — E785 Hyperlipidemia, unspecified: Secondary | ICD-10-CM | POA: Diagnosis not present

## 2013-05-26 DIAGNOSIS — M8580 Other specified disorders of bone density and structure, unspecified site: Secondary | ICD-10-CM

## 2013-05-26 DIAGNOSIS — M949 Disorder of cartilage, unspecified: Secondary | ICD-10-CM

## 2013-05-26 LAB — POCT URINALYSIS DIPSTICK
BILIRUBIN UA: NEGATIVE
Glucose, UA: NEGATIVE
KETONES UA: NEGATIVE
Nitrite, UA: NEGATIVE
Spec Grav, UA: 1.01
Urobilinogen, UA: NEGATIVE
pH, UA: 8

## 2013-05-26 LAB — POCT UA - MICROSCOPIC ONLY
Casts, Ur, LPF, POC: NEGATIVE
Crystals, Ur, HPF, POC: NEGATIVE
MUCUS UA: NEGATIVE
YEAST UA: NEGATIVE

## 2013-05-26 MED ORDER — CIPROFLOXACIN HCL 500 MG PO TABS: 500.0000 mg | ORAL_TABLET | Freq: Two times a day (BID) | ORAL | Status: DC

## 2013-05-26 MED ORDER — PAROXETINE HCL 40 MG PO TABS: ORAL_TABLET | ORAL | Status: DC

## 2013-05-26 NOTE — Patient Instructions (Signed)
Health Maintenance, Female A healthy lifestyle and preventative care can promote health and wellness.  Maintain regular health, dental, and eye exams.  Eat a healthy diet. Foods like vegetables, fruits, whole grains, low-fat dairy products, and lean protein foods contain the nutrients you need without too many calories. Decrease your intake of foods high in solid fats, added sugars, and salt. Get information about a proper diet from your caregiver, if necessary.  Regular physical exercise is one of the most important things you can do for your health. Most adults should get at least 150 minutes of moderate-intensity exercise (any activity that increases your heart rate and causes you to sweat) each week. In addition, most adults need muscle-strengthening exercises on 2 or more days a week.   Maintain a healthy weight. The body mass index (BMI) is a screening tool to identify possible weight problems. It provides an estimate of body fat based on height and weight. Your caregiver can help determine your BMI, and can help you achieve or maintain a healthy weight. For adults 20 years and older:  A BMI below 18.5 is considered underweight.  A BMI of 18.5 to 24.9 is normal.  A BMI of 25 to 29.9 is considered overweight.  A BMI of 30 and above is considered obese.  Maintain normal blood lipids and cholesterol by exercising and minimizing your intake of saturated fat. Eat a balanced diet with plenty of fruits and vegetables. Blood tests for lipids and cholesterol should begin at age 20 and be repeated every 5 years. If your lipid or cholesterol levels are high, you are over 50, or you are a high risk for heart disease, you may need your cholesterol levels checked more frequently.Ongoing high lipid and cholesterol levels should be treated with medicines if diet and exercise are not effective.  If you smoke, find out from your caregiver how to quit. If you do not use tobacco, do not start.  Lung  cancer screening is recommended for adults aged 55 80 years who are at high risk for developing lung cancer because of a history of smoking. Yearly low-dose computed tomography (CT) is recommended for people who have at least a 30-pack-year history of smoking and are a current smoker or have quit within the past 15 years. A pack year of smoking is smoking an average of 1 pack of cigarettes a day for 1 year (for example: 1 pack a day for 30 years or 2 packs a day for 15 years). Yearly screening should continue until the smoker has stopped smoking for at least 15 years. Yearly screening should also be stopped for people who develop a health problem that would prevent them from having lung cancer treatment.  If you are pregnant, do not drink alcohol. If you are breastfeeding, be very cautious about drinking alcohol. If you are not pregnant and choose to drink alcohol, do not exceed 1 drink per day. One drink is considered to be 12 ounces (355 mL) of beer, 5 ounces (148 mL) of wine, or 1.5 ounces (44 mL) of liquor.  Avoid use of street drugs. Do not share needles with anyone. Ask for help if you need support or instructions about stopping the use of drugs.  High blood pressure causes heart disease and increases the risk of stroke. Blood pressure should be checked at least every 1 to 2 years. Ongoing high blood pressure should be treated with medicines, if weight loss and exercise are not effective.  If you are 55 to   72 years old, ask your caregiver if you should take aspirin to prevent strokes.  Diabetes screening involves taking a blood sample to check your fasting blood sugar level. This should be done once every 3 years, after age 76, if you are within normal weight and without risk factors for diabetes. Testing should be considered at a younger age or be carried out more frequently if you are overweight and have at least 1 risk factor for diabetes.  Breast cancer screening is essential preventative care  for women. You should practice "breast self-awareness." This means understanding the normal appearance and feel of your breasts and may include breast self-examination. Any changes detected, no matter how small, should be reported to a caregiver. Women in their 62s and 30s should have a clinical breast exam (CBE) by a caregiver as part of a regular health exam every 1 to 3 years. After age 1, women should have a CBE every year. Starting at age 60, women should consider having a mammogram (breast X-ray) every year. Women who have a family history of breast cancer should talk to their caregiver about genetic screening. Women at a high risk of breast cancer should talk to their caregiver about having an MRI and a mammogram every year.  Breast cancer gene (BRCA)-related cancer risk assessment is recommended for women who have family members with BRCA-related cancers. BRCA-related cancers include breast, ovarian, tubal, and peritoneal cancers. Having family members with these cancers may be associated with an increased risk for harmful changes (mutations) in the breast cancer genes BRCA1 and BRCA2. Results of the assessment will determine the need for genetic counseling and BRCA1 and BRCA2 testing.  The Pap test is a screening test for cervical cancer. Women should have a Pap test starting at age 3. Between ages 68 and 64, Pap tests should be repeated every 2 years. Beginning at age 6, you should have a Pap test every 3 years as long as the past 3 Pap tests have been normal. If you had a hysterectomy for a problem that was not cancer or a condition that could lead to cancer, then you no longer need Pap tests. If you are between ages 38 and 14, and you have had normal Pap tests going back 10 years, you no longer need Pap tests. If you have had past treatment for cervical cancer or a condition that could lead to cancer, you need Pap tests and screening for cancer for at least 20 years after your treatment. If Pap  tests have been discontinued, risk factors (such as a new sexual partner) need to be reassessed to determine if screening should be resumed. Some women have medical problems that increase the chance of getting cervical cancer. In these cases, your caregiver may recommend more frequent screening and Pap tests.  The human papillomavirus (HPV) test is an additional test that may be used for cervical cancer screening. The HPV test looks for the virus that can cause the cell changes on the cervix. The cells collected during the Pap test can be tested for HPV. The HPV test could be used to screen women aged 38 years and older, and should be used in women of any age who have unclear Pap test results. After the age of 32, women should have HPV testing at the same frequency as a Pap test.  Colorectal cancer can be detected and often prevented. Most routine colorectal cancer screening begins at the age of 27 and continues through age 40. However, your caregiver  may recommend screening at an earlier age if you have risk factors for colon cancer. On a yearly basis, your caregiver may provide home test kits to check for hidden blood in the stool. Use of a small camera at the end of a tube, to directly examine the colon (sigmoidoscopy or colonoscopy), can detect the earliest forms of colorectal cancer. Talk to your caregiver about this at age 57, when routine screening begins. Direct examination of the colon should be repeated every 5 to 10 years through age 78, unless early forms of pre-cancerous polyps or small growths are found.  Hepatitis C blood testing is recommended for all people born from [birth year] through 1964 and any individual with known risks for hepatitis C.  Practice safe sex. Use condoms and avoid high-risk sexual practices to reduce the spread of sexually transmitted infections (STIs). Sexually active women aged 73 and younger should be checked for Chlamydia, which is a common sexually transmitted infection.  Older women with new or multiple partners should also be tested for Chlamydia. Testing for other STIs is recommended if you are sexually active and at increased risk.  Osteoporosis is a disease in which the bones lose minerals and strength with aging. This can result in serious bone fractures. The risk of osteoporosis can be identified using a bone density scan. Women ages 62 and over and women at risk for fractures or osteoporosis should discuss screening with their caregivers. Ask your caregiver whether you should be taking a calcium supplement or vitamin D to reduce the rate of osteoporosis.  Menopause can be associated with physical symptoms and risks. Hormone replacement therapy is available to decrease symptoms and risks. You should talk to your caregiver about whether hormone replacement therapy is right for you.  Use sunscreen. Apply sunscreen liberally and repeatedly throughout the day. You should seek shade when your shadow is shorter than you. Protect yourself by wearing long sleeves, pants, a wide-brimmed hat, and sunglasses year round, whenever you are outdoors.  Notify your caregiver of new moles or changes in moles, especially if there is a change in shape or color. Also notify your caregiver if a mole is larger than the size of a pencil eraser.  Stay current with your immunizations. Document Released: 10/22/2010 Document Revised: 08/03/2012 Document Reviewed: 10/22/2010 [facility] Patient Information 2014 [employer].

## 2013-05-26 NOTE — Progress Notes (Signed)
Subjective:    Patient ID: Teresa Holloway, female    DOB: [birth year], 73 y.o.   MRN: [id number]  Patient in today for follow up- she is c/o of dysuria and frequency tat started 2 days ago. Has tried no OTC meds.  Hypertension This is a chronic problem. The current episode started more than 1 year ago. The problem is unchanged. The problem is controlled. Pertinent negatives include no blurred vision, chest pain, headaches, palpitations, peripheral edema or shortness of breath. There are no associated agents to hypertension. Risk factors for coronary artery disease include dyslipidemia, family history and post-menopausal state. Past treatments include calcium channel blockers, ACE inhibitors and beta blockers. The current treatment provides moderate improvement. Compliance problems include exercise.  Hypertensive end-organ damage includes CAD/MI (2004 and 2007).  Hyperlipidemia This is a chronic problem. The current episode started more than 1 year ago. The problem is controlled. Recent lipid tests were reviewed and are normal. Pertinent negatives include no chest pain, focal weakness, leg pain, myalgias or shortness of breath. Current antihyperlipidemic treatment includes statins. The current treatment provides moderate improvement of lipids. Compliance problems include adherence to exercise.  Risk factors for coronary artery disease include hypertension and post-menopausal.  Gastrophageal Reflux She reports no chest pain, no coughing, no early satiety, no heartburn, no nausea or no sore throat. This is a chronic problem. The current episode started more than 1 year ago. The problem occurs rarely. The problem has been resolved. The symptoms are aggravated by caffeine. Pertinent negatives include no fatigue, orthopnea or weight loss. There are no known risk factors. She has tried a PPI for the symptoms. The treatment provided no relief.  Depression Currently doing well on Paxil. Stays active and on the  go all the time. Denies any crying episodes. Hx CAD/MI On plavix- denies any bleeding problems. See Cardiologist yearly osteopenia Patient on no meds- was walking daily but hasn't been for the last 2 months.  Review of Systems  Constitutional: Negative for weight loss and fatigue.  HENT: Negative for sore throat.   Eyes: Negative for blurred vision.  Respiratory: Negative for cough and shortness of breath.   Cardiovascular: Negative for chest pain and palpitations.  Gastrointestinal: Negative for heartburn and nausea.  Musculoskeletal: Negative for myalgias.  Neurological: Negative for focal weakness and headaches.       Objective:   Physical Exam  Constitutional: She is oriented to person, place, and time. She appears well-developed and well-nourished.  HENT:  Nose: Nose normal.  Mouth/Throat: Oropharynx is clear and moist.  Eyes: EOM are normal.  Neck: Trachea normal, normal range of motion and full passive range of motion without pain. Neck supple. No JVD present. Carotid bruit is not present. No thyromegaly present.  Cardiovascular: Normal rate, regular rhythm, normal heart sounds and intact distal pulses.  Exam reveals no gallop and no friction rub.   No murmur heard. Pulmonary/Chest: Effort normal and breath sounds normal.  Abdominal: Soft. Bowel sounds are normal. She exhibits no distension and no mass. There is no tenderness.  Musculoskeletal: Normal range of motion.  Lymphadenopathy:    She has no cervical adenopathy.  Neurological: She is alert and oriented to person, place, and time. She has normal reflexes.  Skin: Skin is warm and dry.  Psychiatric: She has a normal mood and affect. Her behavior is normal. Judgment and thought content normal.   BP 126/56  Pulse 55  Temp(Src) 99 F (37.2 C) (Oral)  Ht 5\' 4"  (1.626 m)  Wt 174 lb (78.926 kg)  BMI 29.85 kg/m2   Results for orders placed in visit on 05/26/13  POCT URINALYSIS DIPSTICK      Result Value Range    Color, UA gold     Clarity, UA clear     Glucose, UA negative     Bilirubin, UA negative     Ketones, UA negative     Spec Grav, UA 1.010     Blood, UA large     pH, UA 8.0     Protein, UA 4+     Urobilinogen, UA negative     Nitrite, UA negative     Leukocytes, UA large (3+)    POCT UA - MICROSCOPIC ONLY      Result Value Range   WBC, Ur, HPF, POC tntc     RBC, urine, microscopic tntc     Bacteria, U Microscopic many     Mucus, UA negative     Epithelial cells, urine per micros few     Crystals, Ur, HPF, POC negative     Casts, Ur, LPF, POC negative     Yeast, UA negative          Assessment & Plan:   1. Frequent urination   2. Hyperlipidemia with target LDL less than 100   3. GERD (gastroesophageal reflux disease)   4. Essential hypertension, benign   5. CAD (coronary artery disease)   6. Osteopenia    Orders Placed This Encounter  Procedures  . Urine culture  . DG Bone Density    Standing Status: Future     Number of Occurrences:      Standing Expiration Date: 07/26/2014    Order Specific Question:  Reason for Exam (SYMPTOM  OR DIAGNOSIS REQUIRED)    Answer:  osteopenia    Order Specific Question:  Preferred imaging location?    Answer:  Internal  . CMP14+EGFR  . NMR, lipoprofile  . POCT urinalysis dipstick  . POCT UA - Microscopic Only   Meds ordered this encounter  Medications  . ciprofloxacin (CIPRO) 500 MG tablet    Sig: Take 1 tablet (500 mg total) by mouth 2 (two) times daily.    Dispense:  14 tablet    Refill:  0    Order Specific Question:  Supervising Provider    Answer:  Ernestina Penna [[id number]]  . PARoxetine (PAXIL) 40 MG tablet    Sig: TAKE 1 TABLET EVERY DAY    Dispense:  90 tablet    Refill:  1    Order Specific Question:  Supervising Provider    Answer:  Christell Constant, DONALD W [[id number]]   Refuses pneumovax and shingles vaccine Will schedule mammogram on way out Labs pending Health maintenance reviewed Diet and exercise encouraged Continue all  meds Follow up  In 3 months   Mary-Margaret Daphine Deutscher, FNP

## 2013-05-27 LAB — SPECIMEN STATUS REPORT

## 2013-05-28 LAB — URINE CULTURE

## 2013-05-29 LAB — SPECIMEN STATUS REPORT

## 2013-05-29 LAB — LIPID PANEL
CHOLESTEROL TOTAL: 136 mg/dL (ref 100–199)
Chol/HDL Ratio: 1.9 ratio units (ref 0.0–4.4)
HDL: 72 mg/dL (ref >39)
LDL CALC: 47 mg/dL (ref 0–99)
TRIGLYCERIDES: 87 mg/dL (ref 0–149)
VLDL CHOLESTEROL CAL: 17 mg/dL (ref 5–40)

## 2013-05-31 LAB — NMR, LIPOPROFILE

## 2013-05-31 LAB — CMP14+EGFR
ALT: 9 IU/L (ref 0–32)
AST: 11 IU/L (ref 0–40)
Albumin/Globulin Ratio: 2.2 (ref 1.1–2.5)
Albumin: 4.2 g/dL (ref 3.5–4.8)
Alkaline Phosphatase: 87 IU/L (ref 39–117)
BUN/Creatinine Ratio: 12 (ref 11–26)
BUN: 9 mg/dL (ref 8–27)
CO2: 27 mmol/L (ref 18–29)
Calcium: 9.3 mg/dL (ref 8.7–10.3)
Chloride: 102 mmol/L (ref 97–108)
Creatinine, Ser: 0.78 mg/dL (ref 0.57–1.00)
GFR calc Af Amer: 88 mL/min/1.73 (ref >59)
GFR calc non Af Amer: 77 mL/min/1.73 (ref >59)
Globulin, Total: 1.9 g/dL (ref 1.5–4.5)
Glucose: 90 mg/dL (ref 65–99)
Potassium: 4.3 mmol/L (ref 3.5–5.2)
Sodium: 143 mmol/L (ref 134–144)
Total Bilirubin: 0.5 mg/dL (ref 0.0–1.2)
Total Protein: 6.1 g/dL (ref 6.0–8.5)

## 2013-06-02 ENCOUNTER — Telehealth: Payer: Self-pay | Admitting: Pharmacist

## 2013-06-02 ENCOUNTER — Telehealth: Payer: Self-pay | Admitting: Nurse Practitioner

## 2013-06-02 MED ORDER — HYDROCODONE-ACETAMINOPHEN 5-325 MG PO TABS: ORAL_TABLET | ORAL | Status: DC

## 2013-06-02 NOTE — Telephone Encounter (Signed)
Called patient to see if she received that information on Evista that I mailed her and if she had any questions.  She is still considering Evista and has a DEXA scheduled for the end of Feb.  Will discuss then.

## 2013-06-02 NOTE — Telephone Encounter (Signed)
rx ready for pickup 

## 2013-06-02 NOTE — Telephone Encounter (Signed)
Patient aware pick up is ready

## 2013-06-09 ENCOUNTER — Other Ambulatory Visit: Payer: Self-pay | Admitting: Nurse Practitioner

## 2013-06-10 NOTE — Telephone Encounter (Signed)
We do not do refills on antibiotics

## 2013-06-23 ENCOUNTER — Ambulatory Visit (INDEPENDENT_AMBULATORY_CARE_PROVIDER_SITE_OTHER): Payer: Medicare Other | Admitting: Pharmacist

## 2013-06-23 ENCOUNTER — Encounter: Payer: Self-pay | Admitting: Pharmacist

## 2013-06-23 ENCOUNTER — Ambulatory Visit (INDEPENDENT_AMBULATORY_CARE_PROVIDER_SITE_OTHER): Payer: Medicare Other

## 2013-06-23 VITALS — BP 128/62 | HR 64 | Ht 64.0 in | Wt 170.0 lb

## 2013-06-23 DIAGNOSIS — H409 Unspecified glaucoma: Secondary | ICD-10-CM | POA: Insufficient documentation

## 2013-06-23 DIAGNOSIS — M8580 Other specified disorders of bone density and structure, unspecified site: Secondary | ICD-10-CM

## 2013-06-23 DIAGNOSIS — M810 Age-related osteoporosis without current pathological fracture: Secondary | ICD-10-CM

## 2013-06-23 DIAGNOSIS — M899 Disorder of bone, unspecified: Secondary | ICD-10-CM

## 2013-06-23 DIAGNOSIS — M949 Disorder of cartilage, unspecified: Secondary | ICD-10-CM | POA: Diagnosis not present

## 2013-06-23 NOTE — Progress Notes (Signed)
Patient ID: Garrison Columbus, female   DOB: April 10, 1941, 73 y.o.   MRN: [id number]  [facility] Current Height: Height: 5\' 4"  (162.6 cm)      Max Lifetime Height:  5\' 6"  Current Weight: Weight: 170 lb (77.111 kg)       Ethnicity:Caucasian  BP: BP: 128/62 mmHg     HR:  Pulse Rate: 64      HPI: Does pt already have a diagnosis of:  Osteopenia? No Osteoporosis?  Yes  Back Pain?  Yes       Kyphosis?  No Prior fracture?  Yes - wrist 2-3 years ago Med(s) for Osteoporosis/Osteopenia:  none Med(s) previously tried for Osteoporosis/Osteopenia:  prolia - stopped because she was concerned about side effects of ONJ.                                                               PMH: Age at menopause:  73 yo Hysterectomy?  No Oophorectomy?  No HRT? Yes - Former.  Type/duration: premarin for 5 to 6 years Steroid Use?  No Thyroid med?  No History of cancer?  No History of digestive disorders (ie Crohn's)?  Yes - takes zantac for GERD Current or previous eating disorders?  No Last Vitamin D Result:  Needed last was 44 on 01/31/2012 Last GFR Result:  77 (2015)   FH/SH: Family history of osteoporosis?  Yes - brother Parent with history of hip fracture?  No Family history of breast cancer?  No Exercise?  Yes - walking regularly Smoking?  No Alcohol?  No    Calcium Assessment Calcium Intake  # of servings/day  Calcium mg  Milk (8 oz) 1  x  300  = 300mg   Yogurt (4 oz) 1 x  200 = 200mg   Cheese (1 oz) 0 x  200 = 0  Other Calcium sources   250mg   Ca supplement 0 = 0   Estimated calcium intake per day 750mg     DEXA Results Date of Test T-Score for AP Spine L1-L4 T-Score for Total Left Hip T-Score for Total Right Hip  06/23/2013 -0.9 -2.1 -2.1  08/19/2012 -1.3 -2.4 -2.3  07/06/2008 -1.0 -2.1 -1.8       **T-Score at neck of right hip was -2.5 06/23/2013** **T-Score at neck of right hip was -2.8 08/19/2013**  Assessment: Osteoporosis - improved BMD but patient has stopped Prolia  due to fear of ONJ  Recommendations: 1.  Discussed incidence of osteonecrosis of the jaw which is why patient stopped Prolia.  She is not good candidate for Evista because of cardiac history and she reports having a blood clot while in the hospital about 10 years ago.  Gave information about how rare ONJ is.  Patient to consider benefits vs risks os Prolia and let me know if she wants to restart. 2.  recommend calcium 1200mg  daily through supplementation or diet.  3.  continue weight bearing exercise - 30 minutes at least 4 days per week.   4.  Counseled and educated about fall risk and prevention.  Recheck DEXA:  1 year  Time spent counseling patient:  20 minutes  Cherre Robins, PharmD, CPP

## 2013-07-01 ENCOUNTER — Telehealth: Payer: Self-pay | Admitting: Nurse Practitioner

## 2013-07-01 MED ORDER — HYDROCODONE-ACETAMINOPHEN 5-325 MG PO TABS: ORAL_TABLET | ORAL | Status: DC

## 2013-07-01 NOTE — Telephone Encounter (Signed)
Patient aware rx ready to be picked up 

## 2013-07-01 NOTE — Telephone Encounter (Signed)
rx ready for pickup 

## 2013-07-01 NOTE — Telephone Encounter (Signed)
Patient has a few left but will run out on sunday

## 2013-07-01 NOTE — Telephone Encounter (Signed)
Was done 06/02/13

## 2013-08-04 ENCOUNTER — Telehealth: Payer: Self-pay | Admitting: Nurse Practitioner

## 2013-08-05 MED ORDER — HYDROCODONE-ACETAMINOPHEN 5-325 MG PO TABS: ORAL_TABLET | ORAL | Status: DC

## 2013-08-05 NOTE — Telephone Encounter (Signed)
rx ready for pickup 

## 2013-08-05 NOTE — Telephone Encounter (Signed)
Left message on home voicemail that script is ready to be picked up with photo ID.

## 2013-08-18 DIAGNOSIS — I252 Old myocardial infarction: Secondary | ICD-10-CM | POA: Diagnosis not present

## 2013-08-18 DIAGNOSIS — I209 Angina pectoris, unspecified: Secondary | ICD-10-CM | POA: Diagnosis not present

## 2013-08-18 DIAGNOSIS — I498 Other specified cardiac arrhythmias: Secondary | ICD-10-CM | POA: Diagnosis not present

## 2013-08-18 DIAGNOSIS — E7800 Pure hypercholesterolemia, unspecified: Secondary | ICD-10-CM | POA: Diagnosis not present

## 2013-08-18 DIAGNOSIS — I2510 Atherosclerotic heart disease of native coronary artery without angina pectoris: Secondary | ICD-10-CM | POA: Diagnosis not present

## 2013-08-18 DIAGNOSIS — I10 Essential (primary) hypertension: Secondary | ICD-10-CM | POA: Diagnosis not present

## 2013-08-23 ENCOUNTER — Encounter: Payer: Self-pay | Admitting: Nurse Practitioner

## 2013-08-23 ENCOUNTER — Ambulatory Visit (INDEPENDENT_AMBULATORY_CARE_PROVIDER_SITE_OTHER): Payer: Medicare Other | Admitting: Nurse Practitioner

## 2013-08-23 VITALS — BP 144/81 | HR 57 | Temp 97.5°F | Ht 64.0 in | Wt 176.0 lb

## 2013-08-23 DIAGNOSIS — K219 Gastro-esophageal reflux disease without esophagitis: Secondary | ICD-10-CM

## 2013-08-23 DIAGNOSIS — E785 Hyperlipidemia, unspecified: Secondary | ICD-10-CM | POA: Diagnosis not present

## 2013-08-23 DIAGNOSIS — F3289 Other specified depressive episodes: Secondary | ICD-10-CM

## 2013-08-23 DIAGNOSIS — M810 Age-related osteoporosis without current pathological fracture: Secondary | ICD-10-CM | POA: Diagnosis not present

## 2013-08-23 DIAGNOSIS — F329 Major depressive disorder, single episode, unspecified: Secondary | ICD-10-CM | POA: Insufficient documentation

## 2013-08-23 DIAGNOSIS — M169 Osteoarthritis of hip, unspecified: Secondary | ICD-10-CM

## 2013-08-23 DIAGNOSIS — I2510 Atherosclerotic heart disease of native coronary artery without angina pectoris: Secondary | ICD-10-CM | POA: Diagnosis not present

## 2013-08-23 DIAGNOSIS — I10 Essential (primary) hypertension: Secondary | ICD-10-CM

## 2013-08-23 DIAGNOSIS — M1612 Unilateral primary osteoarthritis, left hip: Secondary | ICD-10-CM | POA: Insufficient documentation

## 2013-08-23 DIAGNOSIS — M1610 Unilateral primary osteoarthritis, unspecified hip: Secondary | ICD-10-CM

## 2013-08-23 DIAGNOSIS — F32A Depression, unspecified: Secondary | ICD-10-CM

## 2013-08-23 MED ORDER — PAROXETINE HCL 40 MG PO TABS: ORAL_TABLET | ORAL | Status: DC

## 2013-08-23 MED ORDER — HYDROCODONE-ACETAMINOPHEN 5-325 MG PO TABS: ORAL_TABLET | ORAL | Status: DC

## 2013-08-23 NOTE — Progress Notes (Signed)
Subjective:    Patient ID: Teresa Holloway, female    DOB: [birth year], 73 y.o.   MRN: [id number]  Patient here today for follow up of chronic medical problems. NO changes since last visit- no complaints today.  Hypertension This is a chronic problem. The current episode started more than 1 year ago. The problem has been resolved since onset. The problem is controlled. Associated symptoms include palpitations and peripheral edema. Pertinent negatives include no blurred vision, chest pain, headaches, neck pain or shortness of breath. There are no associated agents to hypertension. Risk factors for coronary artery disease include dyslipidemia and post-menopausal state. Past treatments include calcium channel blockers, ACE inhibitors and beta blockers. The current treatment provides moderate improvement. Compliance problems include diet.  Hypertensive end-organ damage includes CAD/MI.  Hyperlipidemia This is a chronic problem. The current episode started more than 1 year ago. The problem is uncontrolled. Recent lipid tests were reviewed and are high. She has no history of diabetes, hypothyroidism, obesity or nephrotic syndrome. There are no known factors aggravating her hyperlipidemia. Pertinent negatives include no chest pain or shortness of breath. Current antihyperlipidemic treatment includes statins. The current treatment provides moderate improvement of lipids. Compliance problems include adherence to diet.  Risk factors for coronary artery disease include hypertension.  CAD/MI Currently on Imdurr - no C/O chest pain or SOB or Dyspnea- Sees Dr. Dorena Cookey sees yearly and just had follow-up 1 week ago. Depression Paxil daily  Which is working well for her- no c/o side effects Vitamin D deficiency  Vitamin D OTC- No side effects Osteoarthritis bil knees and hips- takes hydrocodone BID and says that can't walk without it due to the pain. GERD Zantac working well to keep symptoms under  control.   Review of Systems  Eyes: Negative for blurred vision.  Respiratory: Negative for shortness of breath.   Cardiovascular: Positive for palpitations. Negative for chest pain.  Musculoskeletal: Negative for neck pain.  Neurological: Negative for headaches.  All other systems reviewed and are negative.      Objective:   Physical Exam  Constitutional: She is oriented to person, place, and time. She appears well-developed and well-nourished.  HENT:  Nose: Nose normal.  Mouth/Throat: Oropharynx is clear and moist.  Eyes: EOM are normal.  Neck: Trachea normal, normal range of motion and full passive range of motion without pain. Neck supple. No JVD present. Carotid bruit is not present. No thyromegaly present.  Cardiovascular: Normal rate, regular rhythm, normal heart sounds and intact distal pulses.  Exam reveals no gallop and no friction rub.   No murmur heard. Pulmonary/Chest: Effort normal and breath sounds normal.  Abdominal: Soft. Bowel sounds are normal. She exhibits no distension and no mass. There is no tenderness.  Musculoskeletal: Normal range of motion.  Lymphadenopathy:    She has no cervical adenopathy.  Neurological: She is alert and oriented to person, place, and time. She has normal reflexes.  Skin: Skin is warm and dry.  Psychiatric: She has a normal mood and affect. Her behavior is normal. Judgment and thought content normal.     BP 144/81  Pulse 57  Temp(Src) 97.5 F (36.4 C) (Oral)  Ht 5\' 4"  (1.626 m)  Wt 176 lb (79.833 kg)  BMI 30.20 kg/m2      Assessment & Plan:   1. Osteoporosis   2. Hyperlipidemia with target LDL less than 100   3. GERD (gastroesophageal reflux disease)   4. CAD (coronary artery disease)   5. Essential hypertension, benign  6. Osteoarthritis of left hip   7. Depression    Orders Placed This Encounter  Procedures  . CMP14+EGFR  . NMR, lipoprofile   Meds ordered this encounter  Medications  .  HYDROcodone-acetaminophen (NORCO/VICODIN) 5-325 MG per tablet    Sig: TAKE 1 TABLET BY MOUTH EVERY 6 HOURS AS NEEDED FOR PAIN    Dispense:  60 tablet    Refill:  0    DO NOT FILL TILL 09/03/13    Order Specific Question:  Supervising Provider    Answer:  Ernestina Penna [[id number]]  . PARoxetine (PAXIL) 40 MG tablet    Sig: TAKE 1 TABLET EVERY DAY    Dispense:  90 tablet    Refill:  1    Order Specific Question:  Supervising Provider    Answer:  Ernestina Penna [[id number]]  refuses immunizations hemoccult cards given to patient with directions Labs pending Health maintenance reviewed Diet and exercise encouraged Continue all meds Follow up  In 3 months   Mary-Margaret Daphine Deutscher, FNP

## 2013-08-23 NOTE — Patient Instructions (Signed)
Health Maintenance, Female A healthy lifestyle and preventative care can promote health and wellness.  Maintain regular health, dental, and eye exams.  Eat a healthy diet. Foods like vegetables, fruits, whole grains, low-fat dairy products, and lean protein foods contain the nutrients you need without too many calories. Decrease your intake of foods high in solid fats, added sugars, and salt. Get information about a proper diet from your caregiver, if necessary.  Regular physical exercise is one of the most important things you can do for your health. Most adults should get at least 150 minutes of moderate-intensity exercise (any activity that increases your heart rate and causes you to sweat) each week. In addition, most adults need muscle-strengthening exercises on 2 or more days a week.   Maintain a healthy weight. The body mass index (BMI) is a screening tool to identify possible weight problems. It provides an estimate of body fat based on height and weight. Your caregiver can help determine your BMI, and can help you achieve or maintain a healthy weight. For adults 20 years and older:  A BMI below 18.5 is considered underweight.  A BMI of 18.5 to 24.9 is normal.  A BMI of 25 to 29.9 is considered overweight.  A BMI of 30 and above is considered obese.  Maintain normal blood lipids and cholesterol by exercising and minimizing your intake of saturated fat. Eat a balanced diet with plenty of fruits and vegetables. Blood tests for lipids and cholesterol should begin at age 6 and be repeated every 5 years. If your lipid or cholesterol levels are high, you are over 50, or you are a high risk for heart disease, you may need your cholesterol levels checked more frequently.Ongoing high lipid and cholesterol levels should be treated with medicines if diet and exercise are not effective.  If you smoke, find out from your caregiver how to quit. If you do not use tobacco, do not start.  Lung  cancer screening is recommended for adults aged 49 72 years who are at high risk for developing lung cancer because of a history of smoking. Yearly low-dose computed tomography (CT) is recommended for people who have at least a 30-pack-year history of smoking and are a current smoker or have quit within the past 15 years. A pack year of smoking is smoking an average of 1 pack of cigarettes a day for 1 year (for example: 1 pack a day for 30 years or 2 packs a day for 15 years). Yearly screening should continue until the smoker has stopped smoking for at least 15 years. Yearly screening should also be stopped for people who develop a health problem that would prevent them from having lung cancer treatment.  If you are pregnant, do not drink alcohol. If you are breastfeeding, be very cautious about drinking alcohol. If you are not pregnant and choose to drink alcohol, do not exceed 1 drink per day. One drink is considered to be 12 ounces (355 mL) of beer, 5 ounces (148 mL) of wine, or 1.5 ounces (44 mL) of liquor.  Avoid use of street drugs. Do not share needles with anyone. Ask for help if you need support or instructions about stopping the use of drugs.  High blood pressure causes heart disease and increases the risk of stroke. Blood pressure should be checked at least every 1 to 2 years. Ongoing high blood pressure should be treated with medicines, if weight loss and exercise are not effective.  If you are 55 to  72 years old, ask your caregiver if you should take aspirin to prevent strokes.  Diabetes screening involves taking a blood sample to check your fasting blood sugar level. This should be done once every 3 years, after age 72, if you are within normal weight and without risk factors for diabetes. Testing should be considered at a younger age or be carried out more frequently if you are overweight and have at least 1 risk factor for diabetes.  Breast cancer screening is essential preventative care  for women. You should practice "breast self-awareness." This means understanding the normal appearance and feel of your breasts and may include breast self-examination. Any changes detected, no matter how small, should be reported to a caregiver. Women in their 72s and 30s should have a clinical breast exam (CBE) by a caregiver as part of a regular health exam every 1 to 3 years. After age 39, women should have a CBE every year. Starting at age 20, women should consider having a mammogram (breast X-ray) every year. Women who have a family history of breast cancer should talk to their caregiver about genetic screening. Women at a high risk of breast cancer should talk to their caregiver about having an MRI and a mammogram every year.  Breast cancer gene (BRCA)-related cancer risk assessment is recommended for women who have family members with BRCA-related cancers. BRCA-related cancers include breast, ovarian, tubal, and peritoneal cancers. Having family members with these cancers may be associated with an increased risk for harmful changes (mutations) in the breast cancer genes BRCA1 and BRCA2. Results of the assessment will determine the need for genetic counseling and BRCA1 and BRCA2 testing.  The Pap test is a screening test for cervical cancer. Women should have a Pap test starting at age 72. Between ages 1 and 15, Pap tests should be repeated every 2 years. Beginning at age 65, you should have a Pap test every 3 years as long as the past 3 Pap tests have been normal. If you had a hysterectomy for a problem that was not cancer or a condition that could lead to cancer, then you no longer need Pap tests. If you are between ages 44 and 45, and you have had normal Pap tests going back 10 years, you no longer need Pap tests. If you have had past treatment for cervical cancer or a condition that could lead to cancer, you need Pap tests and screening for cancer for at least 20 years after your treatment. If Pap  tests have been discontinued, risk factors (such as a new sexual partner) need to be reassessed to determine if screening should be resumed. Some women have medical problems that increase the chance of getting cervical cancer. In these cases, your caregiver may recommend more frequent screening and Pap tests.  The human papillomavirus (HPV) test is an additional test that may be used for cervical cancer screening. The HPV test looks for the virus that can cause the cell changes on the cervix. The cells collected during the Pap test can be tested for HPV. The HPV test could be used to screen women aged 68 years and older, and should be used in women of any age who have unclear Pap test results. After the age of 49, women should have HPV testing at the same frequency as a Pap test.  Colorectal cancer can be detected and often prevented. Most routine colorectal cancer screening begins at the age of 45 and continues through age 78. However, your caregiver  may recommend screening at an earlier age if you have risk factors for colon cancer. On a yearly basis, your caregiver may provide home test kits to check for hidden blood in the stool. Use of a small camera at the end of a tube, to directly examine the colon (sigmoidoscopy or colonoscopy), can detect the earliest forms of colorectal cancer. Talk to your caregiver about this at age 34, when routine screening begins. Direct examination of the colon should be repeated every 5 to 10 years through age 60, unless early forms of pre-cancerous polyps or small growths are found.  Hepatitis C blood testing is recommended for all people born from [birth year] through 1964 and any individual with known risks for hepatitis C.  Practice safe sex. Use condoms and avoid high-risk sexual practices to reduce the spread of sexually transmitted infections (STIs). Sexually active women aged 38 and younger should be checked for Chlamydia, which is a common sexually transmitted infection.  Older women with new or multiple partners should also be tested for Chlamydia. Testing for other STIs is recommended if you are sexually active and at increased risk.  Osteoporosis is a disease in which the bones lose minerals and strength with aging. This can result in serious bone fractures. The risk of osteoporosis can be identified using a bone density scan. Women ages 60 and over and women at risk for fractures or osteoporosis should discuss screening with their caregivers. Ask your caregiver whether you should be taking a calcium supplement or vitamin D to reduce the rate of osteoporosis.  Menopause can be associated with physical symptoms and risks. Hormone replacement therapy is available to decrease symptoms and risks. You should talk to your caregiver about whether hormone replacement therapy is right for you.  Use sunscreen. Apply sunscreen liberally and repeatedly throughout the day. You should seek shade when your shadow is shorter than you. Protect yourself by wearing long sleeves, pants, a wide-brimmed hat, and sunglasses year round, whenever you are outdoors.  Notify your caregiver of new moles or changes in moles, especially if there is a change in shape or color. Also notify your caregiver if a mole is larger than the size of a pencil eraser.  Stay current with your immunizations. Document Released: 10/22/2010 Document Revised: 08/03/2012 Document Reviewed: 10/22/2010 [facility] Patient Information 2014 [place].

## 2013-08-24 LAB — CMP14+EGFR
ALT: 7 IU/L (ref 0–32)
AST: 11 IU/L (ref 0–40)
Albumin/Globulin Ratio: 2.2 (ref 1.1–2.5)
Albumin: 4.1 g/dL (ref 3.5–4.8)
Alkaline Phosphatase: 88 IU/L (ref 39–117)
BILIRUBIN TOTAL: 0.4 mg/dL (ref 0.0–1.2)
BUN/Creatinine Ratio: 24 (ref 11–26)
BUN: 16 mg/dL (ref 8–27)
CHLORIDE: 104 mmol/L (ref 97–108)
CO2: 27 mmol/L (ref 18–29)
Calcium: 9.2 mg/dL (ref 8.7–10.3)
Creatinine, Ser: 0.67 mg/dL (ref 0.57–1.00)
GFR calc Af Amer: 102 mL/min/{1.73_m2} (ref >59)
GFR calc non Af Amer: 89 mL/min/{1.73_m2} (ref >59)
Globulin, Total: 1.9 g/dL (ref 1.5–4.5)
Glucose: 93 mg/dL (ref 65–99)
Potassium: 4.2 mmol/L (ref 3.5–5.2)
SODIUM: 143 mmol/L (ref 134–144)
Total Protein: 6 g/dL (ref 6.0–8.5)

## 2013-08-24 LAB — NMR, LIPOPROFILE
CHOLESTEROL: 130 mg/dL (ref <200)
HDL Cholesterol by NMR: 66 mg/dL (ref >=40)
HDL Particle Number: 35.7 umol/L (ref >=30.5)
LDL PARTICLE NUMBER: 666 nmol/L (ref <1000)
LDL SIZE: 20.4 nm (ref >20.5)
LDLC SERPL CALC-MCNC: 47 mg/dL (ref <100)
LP-IR Score: <25 (ref <=45)
Small LDL Particle Number: 255 nmol/L (ref <=527)
Triglycerides by NMR: 83 mg/dL (ref <150)

## 2013-08-26 DIAGNOSIS — H401490 Capsular glaucoma with pseudoexfoliation of lens, unspecified eye, stage unspecified: Secondary | ICD-10-CM | POA: Diagnosis not present

## 2013-08-26 DIAGNOSIS — H409 Unspecified glaucoma: Secondary | ICD-10-CM | POA: Diagnosis not present

## 2013-09-18 ENCOUNTER — Ambulatory Visit (INDEPENDENT_AMBULATORY_CARE_PROVIDER_SITE_OTHER): Payer: Medicare Other | Admitting: Family Medicine

## 2013-09-18 ENCOUNTER — Other Ambulatory Visit: Payer: Self-pay | Admitting: Family Medicine

## 2013-09-18 VITALS — BP 152/82 | HR 61 | Temp 98.7°F | Ht 64.0 in | Wt 177.0 lb

## 2013-09-18 DIAGNOSIS — R300 Dysuria: Secondary | ICD-10-CM

## 2013-09-18 DIAGNOSIS — N390 Urinary tract infection, site not specified: Secondary | ICD-10-CM | POA: Diagnosis not present

## 2013-09-18 DIAGNOSIS — I10 Essential (primary) hypertension: Secondary | ICD-10-CM

## 2013-09-18 DIAGNOSIS — I2510 Atherosclerotic heart disease of native coronary artery without angina pectoris: Secondary | ICD-10-CM | POA: Diagnosis not present

## 2013-09-18 LAB — POCT UA - MICROSCOPIC ONLY
CRYSTALS, UR, HPF, POC: NEGATIVE
Casts, Ur, LPF, POC: NEGATIVE
Mucus, UA: NEGATIVE
Yeast, UA: NEGATIVE

## 2013-09-18 LAB — POCT URINALYSIS DIPSTICK
Glucose, UA: NEGATIVE
Ketones, UA: NEGATIVE
NITRITE UA: NEGATIVE
Spec Grav, UA: 1.01
UROBILINOGEN UA: NEGATIVE
pH, UA: 8

## 2013-09-18 MED ORDER — CIPROFLOXACIN HCL 500 MG PO TABS: 500.0000 mg | ORAL_TABLET | Freq: Two times a day (BID) | ORAL | Status: DC

## 2013-09-18 MED ORDER — PHENAZOPYRIDINE HCL 200 MG PO TABS: 200.0000 mg | ORAL_TABLET | Freq: Three times a day (TID) | ORAL | Status: DC | PRN

## 2013-09-18 NOTE — Patient Instructions (Addendum)
Drink plenty of fluids Take medication as directed the pyridium will turn your water orange-colored that can help the discomfort with voiding We will call you with the results of the culture and sensitivity once those results are returned You'll need to recheck a urinalysis in 10 days. Continue to monitor blood pressures at home and bring readings to your next visit

## 2013-09-18 NOTE — Progress Notes (Signed)
Subjective:    Patient ID: Teresa Holloway, female    DOB: 08/28/1940, 73 y.o.   MRN: [id number]  Urinary Tract Infection  Associated symptoms include flank pain and urgency.   Patient complains of dysuria, lower abd pain, and back pain for 2 days. States that this feels like previous UTIs.    Review of Systems  Constitutional: Negative.   HENT: Negative.   Eyes: Negative.   Respiratory: Negative.   Cardiovascular: Negative.   Gastrointestinal: Negative.   Endocrine: Negative.   Genitourinary: Positive for dysuria, urgency and flank pain.  Musculoskeletal: Negative.   Skin: Negative.   Allergic/Immunologic: Negative.   Neurological: Negative.   Hematological: Negative.   Psychiatric/Behavioral: Negative.        Objective:   Physical Exam  Nursing note and vitals reviewed. Constitutional: She is oriented to person, place, and time. She appears well-developed and well-nourished. No distress.  HENT:  Head: Normocephalic.  Nose: Nose normal.  Eyes: Conjunctivae and EOM are normal. Pupils are equal, round, and reactive to light. Right eye exhibits no discharge. Left eye exhibits no discharge. No scleral icterus.  Neck: Normal range of motion. Neck supple. No thyromegaly present.  Cardiovascular: Normal rate, regular rhythm, normal heart sounds and intact distal pulses.   No murmur heard. At 84 per minute  Pulmonary/Chest: Effort normal and breath sounds normal. No respiratory distress. She has no wheezes. She has no rales. She exhibits no tenderness.  Abdominal: Soft. Bowel sounds are normal. She exhibits no mass. There is tenderness (suprapubic and left and right lower quadrant tenderness). There is no rebound and no guarding.  No flank tenderness  Musculoskeletal: Normal range of motion. She exhibits no edema.  Lymphadenopathy:    She has no cervical adenopathy.  Neurological: She is alert and oriented to person, place, and time.  Skin: Skin is warm and dry. No rash  noted.  Psychiatric: She has a normal mood and affect. Her behavior is normal. Judgment and thought content normal.   BP 152/82  Pulse 61  Temp(Src) 98.7 F (37.1 C) (Oral)  Ht 5\' 4"  (1.626 m)  Wt 177 lb (80.287 kg)  BMI 30.37 kg/m2 She indicates that her home blood pressures usually run between 1:30 and 140 over the 7 days.       Assessment & Plan:   1. Dysuria - POCT urinalysis dipstick - POCT UA - Microscopic Only - Urine culture - phenazopyridine (PYRIDIUM) 200 MG tablet; Take 1 tablet (200 mg total) by mouth 3 (three) times daily as needed for pain.  Dispense: 10 tablet; Refill: 0  2. CAD (coronary artery disease)  3. Essential hypertension, benign  4. UTI (urinary tract infection) - ciprofloxacin (CIPRO) 500 MG tablet; Take 1 tablet (500 mg total) by mouth 2 (two) times daily.  Dispense: 14 tablet; Refill: 0  Patient Instructions  Drink plenty of fluids Take medication as directed the pyridium will turn your water orange-colored that can help the discomfort with voiding We will call you with the results of the culture and sensitivity once those results are returned You'll need to recheck a urinalysis in 10 days. Continue to monitor blood pressures at home and bring readings to your next visit   Nyra Capes MD

## 2013-09-20 DIAGNOSIS — R300 Dysuria: Secondary | ICD-10-CM | POA: Diagnosis not present

## 2013-09-22 LAB — URINE CULTURE

## 2013-09-24 DIAGNOSIS — Z1231 Encounter for screening mammogram for malignant neoplasm of breast: Secondary | ICD-10-CM | POA: Diagnosis not present

## 2013-09-27 ENCOUNTER — Other Ambulatory Visit: Payer: Medicare Other

## 2013-09-27 DIAGNOSIS — N390 Urinary tract infection, site not specified: Secondary | ICD-10-CM | POA: Diagnosis not present

## 2013-09-27 NOTE — Progress Notes (Signed)
Patient dropped off fobt 

## 2013-09-29 LAB — URINE CULTURE

## 2013-10-01 ENCOUNTER — Other Ambulatory Visit: Payer: Self-pay | Admitting: Family Medicine

## 2013-10-01 ENCOUNTER — Telehealth: Payer: Self-pay | Admitting: Nurse Practitioner

## 2013-10-01 DIAGNOSIS — M1612 Unilateral primary osteoarthritis, left hip: Secondary | ICD-10-CM

## 2013-10-01 MED ORDER — HYDROCODONE-ACETAMINOPHEN 5-325 MG PO TABS: ORAL_TABLET | ORAL | Status: DC

## 2013-10-01 NOTE — Telephone Encounter (Signed)
PAtient aware to pick up monday

## 2013-10-01 NOTE — Telephone Encounter (Signed)
Can pick rx up on monday when I return to sign

## 2013-10-05 ENCOUNTER — Other Ambulatory Visit: Payer: Medicare Other

## 2013-10-05 DIAGNOSIS — Z1212 Encounter for screening for malignant neoplasm of rectum: Secondary | ICD-10-CM | POA: Diagnosis not present

## 2013-10-05 NOTE — Addendum Note (Signed)
Addended by: Pollyann Kennedy F on: 10/05/2013 10:36 AM   Modules accepted: Orders

## 2013-10-07 LAB — FECAL OCCULT BLOOD, IMMUNOCHEMICAL: Fecal Occult Bld: NEGATIVE

## 2013-10-29 ENCOUNTER — Telehealth: Payer: Self-pay | Admitting: Nurse Practitioner

## 2013-10-29 DIAGNOSIS — M1612 Unilateral primary osteoarthritis, left hip: Secondary | ICD-10-CM

## 2013-10-29 MED ORDER — HYDROCODONE-ACETAMINOPHEN 5-325 MG PO TABS: ORAL_TABLET | ORAL | Status: DC

## 2013-10-29 NOTE — Telephone Encounter (Signed)
Patient aware.

## 2013-10-29 NOTE — Telephone Encounter (Signed)
rx ready for pickup 

## 2013-11-08 DIAGNOSIS — D235 Other benign neoplasm of skin of trunk: Secondary | ICD-10-CM | POA: Diagnosis not present

## 2013-11-08 DIAGNOSIS — L570 Actinic keratosis: Secondary | ICD-10-CM | POA: Diagnosis not present

## 2013-11-08 DIAGNOSIS — D045 Carcinoma in situ of skin of trunk: Secondary | ICD-10-CM | POA: Diagnosis not present

## 2013-11-08 DIAGNOSIS — L821 Other seborrheic keratosis: Secondary | ICD-10-CM | POA: Diagnosis not present

## 2013-11-29 ENCOUNTER — Telehealth: Payer: Self-pay | Admitting: Nurse Practitioner

## 2013-11-29 DIAGNOSIS — M1612 Unilateral primary osteoarthritis, left hip: Secondary | ICD-10-CM

## 2013-11-29 MED ORDER — HYDROCODONE-ACETAMINOPHEN 5-325 MG PO TABS
ORAL_TABLET | ORAL | Status: DC
Start: 2013-11-29 — End: 2013-12-30

## 2013-11-29 NOTE — Telephone Encounter (Signed)
rx ready for pickup 

## 2013-11-30 NOTE — Telephone Encounter (Signed)
Patient aware to pick up 

## 2013-12-01 ENCOUNTER — Ambulatory Visit: Payer: Medicare Other | Admitting: Nurse Practitioner

## 2013-12-08 DIAGNOSIS — H409 Unspecified glaucoma: Secondary | ICD-10-CM | POA: Diagnosis not present

## 2013-12-08 DIAGNOSIS — H401490 Capsular glaucoma with pseudoexfoliation of lens, unspecified eye, stage unspecified: Secondary | ICD-10-CM | POA: Diagnosis not present

## 2013-12-30 ENCOUNTER — Ambulatory Visit (INDEPENDENT_AMBULATORY_CARE_PROVIDER_SITE_OTHER): Payer: Medicare Other | Admitting: Nurse Practitioner

## 2013-12-30 ENCOUNTER — Encounter: Payer: Self-pay | Admitting: Nurse Practitioner

## 2013-12-30 VITALS — BP 182/101 | HR 54 | Temp 98.6°F | Ht 64.0 in | Wt 177.0 lb

## 2013-12-30 DIAGNOSIS — M810 Age-related osteoporosis without current pathological fracture: Secondary | ICD-10-CM

## 2013-12-30 DIAGNOSIS — E785 Hyperlipidemia, unspecified: Secondary | ICD-10-CM | POA: Diagnosis not present

## 2013-12-30 DIAGNOSIS — I25111 Atherosclerotic heart disease of native coronary artery with angina pectoris with documented spasm: Secondary | ICD-10-CM

## 2013-12-30 DIAGNOSIS — I252 Old myocardial infarction: Secondary | ICD-10-CM | POA: Diagnosis not present

## 2013-12-30 DIAGNOSIS — Z713 Dietary counseling and surveillance: Secondary | ICD-10-CM

## 2013-12-30 DIAGNOSIS — Z2911 Encounter for prophylactic immunotherapy for respiratory syncytial virus (RSV): Secondary | ICD-10-CM

## 2013-12-30 DIAGNOSIS — K219 Gastro-esophageal reflux disease without esophagitis: Secondary | ICD-10-CM | POA: Diagnosis not present

## 2013-12-30 DIAGNOSIS — F3289 Other specified depressive episodes: Secondary | ICD-10-CM

## 2013-12-30 DIAGNOSIS — M1610 Unilateral primary osteoarthritis, unspecified hip: Secondary | ICD-10-CM

## 2013-12-30 DIAGNOSIS — F32A Depression, unspecified: Secondary | ICD-10-CM

## 2013-12-30 DIAGNOSIS — I10 Essential (primary) hypertension: Secondary | ICD-10-CM

## 2013-12-30 DIAGNOSIS — M1612 Unilateral primary osteoarthritis, left hip: Secondary | ICD-10-CM

## 2013-12-30 DIAGNOSIS — I2510 Atherosclerotic heart disease of native coronary artery without angina pectoris: Secondary | ICD-10-CM | POA: Diagnosis not present

## 2013-12-30 DIAGNOSIS — I209 Angina pectoris, unspecified: Secondary | ICD-10-CM

## 2013-12-30 DIAGNOSIS — M169 Osteoarthritis of hip, unspecified: Secondary | ICD-10-CM

## 2013-12-30 DIAGNOSIS — R300 Dysuria: Secondary | ICD-10-CM | POA: Diagnosis not present

## 2013-12-30 DIAGNOSIS — F329 Major depressive disorder, single episode, unspecified: Secondary | ICD-10-CM

## 2013-12-30 DIAGNOSIS — Z6830 Body mass index (BMI) 30.0-30.9, adult: Secondary | ICD-10-CM

## 2013-12-30 LAB — POCT URINALYSIS DIPSTICK
BILIRUBIN UA: NEGATIVE
GLUCOSE UA: NEGATIVE
KETONES UA: NEGATIVE
LEUKOCYTES UA: NEGATIVE
Nitrite, UA: NEGATIVE
Protein, UA: NEGATIVE
Spec Grav, UA: <=1.005
Urobilinogen, UA: NEGATIVE
pH, UA: 7.5

## 2013-12-30 LAB — POCT UA - MICROSCOPIC ONLY
BACTERIA, U MICROSCOPIC: NEGATIVE
CASTS, UR, LPF, POC: NEGATIVE
Crystals, Ur, HPF, POC: NEGATIVE
MUCUS UA: NEGATIVE
WBC, UR, HPF, POC: NEGATIVE

## 2013-12-30 MED ORDER — HYDROCODONE-ACETAMINOPHEN 5-325 MG PO TABS: ORAL_TABLET | ORAL | Status: DC

## 2013-12-30 MED ORDER — HYDROCHLOROTHIAZIDE 25 MG PO TABS: 25.0000 mg | ORAL_TABLET | Freq: Every day | ORAL | Status: DC

## 2013-12-30 MED ORDER — AMLODIPINE BESYLATE 10 MG PO TABS: 10.0000 mg | ORAL_TABLET | Freq: Every day | ORAL | Status: DC

## 2013-12-30 NOTE — Patient Instructions (Signed)

## 2013-12-30 NOTE — Progress Notes (Signed)
Subjective:    Patient ID: Teresa Holloway, female    DOB: 1940/08/04, 73 y.o.   MRN: [id number]  Patient here today for follow up of chronic medical problems. NO changes since last visit- no complaints today.  Hypertension This is a chronic problem. The current episode started more than 1 year ago. The problem has been resolved since onset. The problem is uncontrolled (blood pressure has been running high at home.). Associated symptoms include palpitations and peripheral edema. Pertinent negatives include no blurred vision, chest pain, headaches, neck pain or shortness of breath. There are no associated agents to hypertension. Risk factors for coronary artery disease include dyslipidemia and post-menopausal state. Past treatments include calcium channel blockers, ACE inhibitors and beta blockers. The current treatment provides moderate improvement. Compliance problems include diet.  Hypertensive end-organ damage includes CAD/MI.  Hyperlipidemia This is a chronic problem. The current episode started more than 1 year ago. The problem is uncontrolled. Recent lipid tests were reviewed and are high. She has no history of diabetes, hypothyroidism, obesity or nephrotic syndrome. There are no known factors aggravating her hyperlipidemia. Pertinent negatives include no chest pain or shortness of breath. Current antihyperlipidemic treatment includes statins. The current treatment provides moderate improvement of lipids. Compliance problems include adherence to diet.  Risk factors for coronary artery disease include hypertension.  CAD/MI Currently on Imdurr - no C/O chest pain or SOB or Dyspnea- Sees Dr. Dorena Cookey sees yearly and just had follow-up in April. Depression Paxil daily  Which is working well for her- no c/o side effects Vitamin D deficiency  Vitamin D OTC- No side effects Osteoarthritis bil knees and hips- takes hydrocodone BID and says that can't walk without it due to the pain. GERD Zantac  working well to keep symptoms under control. Osteoporosis Patient had  Bone density test in March of this year. Currently on  No meds.   Review of Systems  Eyes: Negative for blurred vision.  Respiratory: Negative for shortness of breath.   Cardiovascular: Positive for palpitations. Negative for chest pain.  Musculoskeletal: Negative for neck pain.  Neurological: Negative for headaches.  All other systems reviewed and are negative.      Objective:   Physical Exam  Constitutional: She is oriented to person, place, and time. She appears well-developed and well-nourished.  HENT:  Nose: Nose normal.  Mouth/Throat: Oropharynx is clear and moist.  Eyes: EOM are normal.  Neck: Trachea normal, normal range of motion and full passive range of motion without pain. Neck supple. No JVD present. Carotid bruit is not present. No thyromegaly present.  Cardiovascular: Normal rate, regular rhythm, normal heart sounds and intact distal pulses.  Exam reveals no gallop and no friction rub.   No murmur heard. Pulmonary/Chest: Effort normal and breath sounds normal.  Abdominal: Soft. Bowel sounds are normal. She exhibits no distension and no mass. There is no tenderness.  Musculoskeletal: Normal range of motion.  Lymphadenopathy:    She has no cervical adenopathy.  Neurological: She is alert and oriented to person, place, and time. She has normal reflexes.  Skin: Skin is warm and dry.  Psychiatric: She has a normal mood and affect. Her behavior is normal. Judgment and thought content normal.     BP 182/101  Pulse 54  Temp(Src) 98.6 F (37 C) (Oral)  Ht 5\' 4"  (1.626 m)  Wt 177 lb (80.287 kg)  BMI 30.37 kg/m2      Assessment & Plan:   1. Hyperlipidemia with target LDL less than 100  2. History of MI (myocardial infarction)   3. Gastroesophageal reflux disease without esophagitis   4. Essential hypertension, benign   5. Depression   6. Coronary artery disease involving native coronary  artery of native heart with angina pectoris with documented spasm   7. Osteoporosis   8. Osteoarthritis of left hip, unspecified osteoarthritis type   9. BMI 30.0-30.9,adult   10. Weight loss counseling, encounter for     Orders Placed This Encounter  Procedures  . CMP14+EGFR  . NMR, lipoprofile   Meds ordered this encounter  Medications  . amLODipine (NORVASC) 10 MG tablet    Sig: Take 1 tablet (10 mg total) by mouth daily.    Dispense:  90 tablet    Refill:  1    Order Specific Question:  Supervising Provider    Answer:  Ernestina Penna [[id number]]  . hydrochlorothiazide (HYDRODIURIL) 25 MG tablet    Sig: Take 1 tablet (25 mg total) by mouth daily.    Dispense:  90 tablet    Refill:  1    Order Specific Question:  Supervising Provider    Answer:  Ernestina Penna [[id number]]  . HYDROcodone-acetaminophen (NORCO/VICODIN) 5-325 MG per tablet    Sig: TAKE 1 TABLET BY MOUTH EVERY 6 HOURS AS NEEDED FOR PAIN    Dispense:  60 tablet    Refill:  0    Order Specific Question:  Supervising Provider    Answer:  Christell Constant, DONALD W [[id number]]  increased amlodipine from 5mg  to 10 mg today and added HCTZ- keep diary of blood pressure zostavax today Discussed weight management for patient with BMI> 25 Labs pending Health maintenance reviewed Diet and exercise encouraged Continue all meds Follow up  In 3months   Mary-Margaret Daphine Deutscher, FNP

## 2013-12-31 LAB — NMR, LIPOPROFILE
Cholesterol: 155 mg/dL (ref 100–199)
HDL CHOLESTEROL BY NMR: 69 mg/dL (ref >39)
HDL PARTICLE NUMBER: 37.7 umol/L (ref >=30.5)
LDL Particle Number: 554 nmol/L (ref <1000)
LDL Size: 21.9 nm (ref >20.5)
LDLC SERPL CALC-MCNC: 65 mg/dL (ref 0–99)
LP-IR Score: <25 (ref <=45)
Small LDL Particle Number: <90 nmol/L (ref <=527)
Triglycerides by NMR: 105 mg/dL (ref 0–149)

## 2013-12-31 LAB — CMP14+EGFR
ALT: 7 IU/L (ref 0–32)
AST: 11 IU/L (ref 0–40)
Albumin/Globulin Ratio: 1.8 (ref 1.1–2.5)
Albumin: 4.2 g/dL (ref 3.5–4.8)
Alkaline Phosphatase: 102 IU/L (ref 39–117)
BUN/Creatinine Ratio: 16 (ref 11–26)
BUN: 12 mg/dL (ref 8–27)
CALCIUM: 9.6 mg/dL (ref 8.7–10.3)
CO2: 24 mmol/L (ref 18–29)
CREATININE: 0.74 mg/dL (ref 0.57–1.00)
Chloride: 100 mmol/L (ref 97–108)
GFR calc Af Amer: 94 mL/min/{1.73_m2} (ref >59)
GFR calc non Af Amer: 82 mL/min/{1.73_m2} (ref >59)
GLOBULIN, TOTAL: 2.4 g/dL (ref 1.5–4.5)
Glucose: 91 mg/dL (ref 65–99)
Potassium: 4.5 mmol/L (ref 3.5–5.2)
Sodium: 141 mmol/L (ref 134–144)
Total Bilirubin: 0.4 mg/dL (ref 0.0–1.2)
Total Protein: 6.6 g/dL (ref 6.0–8.5)

## 2014-01-04 ENCOUNTER — Telehealth: Payer: Self-pay | Admitting: Nurse Practitioner

## 2014-02-01 ENCOUNTER — Telehealth: Payer: Self-pay | Admitting: Nurse Practitioner

## 2014-02-01 DIAGNOSIS — M1612 Unilateral primary osteoarthritis, left hip: Secondary | ICD-10-CM

## 2014-02-01 MED ORDER — HYDROCODONE-ACETAMINOPHEN 5-325 MG PO TABS: ORAL_TABLET | ORAL | Status: DC

## 2014-02-01 NOTE — Telephone Encounter (Signed)
rx ready for pickup 

## 2014-02-02 NOTE — Telephone Encounter (Signed)
Left message up front

## 2014-03-02 ENCOUNTER — Other Ambulatory Visit: Payer: Self-pay | Admitting: Nurse Practitioner

## 2014-03-02 DIAGNOSIS — M1612 Unilateral primary osteoarthritis, left hip: Secondary | ICD-10-CM

## 2014-03-02 MED ORDER — HYDROCODONE-ACETAMINOPHEN 5-325 MG PO TABS: ORAL_TABLET | ORAL | Status: DC

## 2014-03-02 NOTE — Telephone Encounter (Signed)
Aware ,script ready. 

## 2014-03-02 NOTE — Telephone Encounter (Signed)
Hydrocodone rx ready for pick up 

## 2014-04-01 ENCOUNTER — Encounter: Payer: Self-pay | Admitting: Nurse Practitioner

## 2014-04-01 ENCOUNTER — Encounter (INDEPENDENT_AMBULATORY_CARE_PROVIDER_SITE_OTHER): Payer: Self-pay

## 2014-04-01 ENCOUNTER — Ambulatory Visit (INDEPENDENT_AMBULATORY_CARE_PROVIDER_SITE_OTHER): Payer: Medicare Other | Admitting: Nurse Practitioner

## 2014-04-01 ENCOUNTER — Ambulatory Visit (INDEPENDENT_AMBULATORY_CARE_PROVIDER_SITE_OTHER): Payer: Medicare Other

## 2014-04-01 VITALS — BP 122/73 | HR 57 | Temp 98.6°F | Ht 64.0 in | Wt 175.0 lb

## 2014-04-01 DIAGNOSIS — F329 Major depressive disorder, single episode, unspecified: Secondary | ICD-10-CM | POA: Diagnosis not present

## 2014-04-01 DIAGNOSIS — R300 Dysuria: Secondary | ICD-10-CM | POA: Diagnosis not present

## 2014-04-01 DIAGNOSIS — E785 Hyperlipidemia, unspecified: Secondary | ICD-10-CM

## 2014-04-01 DIAGNOSIS — I10 Essential (primary) hypertension: Secondary | ICD-10-CM

## 2014-04-01 DIAGNOSIS — M1612 Unilateral primary osteoarthritis, left hip: Secondary | ICD-10-CM

## 2014-04-01 DIAGNOSIS — F32A Depression, unspecified: Secondary | ICD-10-CM

## 2014-04-01 DIAGNOSIS — I25111 Atherosclerotic heart disease of native coronary artery with angina pectoris with documented spasm: Secondary | ICD-10-CM

## 2014-04-01 DIAGNOSIS — I2510 Atherosclerotic heart disease of native coronary artery without angina pectoris: Secondary | ICD-10-CM | POA: Diagnosis not present

## 2014-04-01 DIAGNOSIS — K219 Gastro-esophageal reflux disease without esophagitis: Secondary | ICD-10-CM

## 2014-04-01 LAB — POCT UA - MICROSCOPIC ONLY
Bacteria, U Microscopic: NEGATIVE
Casts, Ur, LPF, POC: NEGATIVE
Crystals, Ur, HPF, POC: NEGATIVE
Yeast, UA: NEGATIVE

## 2014-04-01 LAB — POCT URINALYSIS DIPSTICK
BILIRUBIN UA: NEGATIVE
Glucose, UA: NEGATIVE
Ketones, UA: NEGATIVE
LEUKOCYTES UA: NEGATIVE
Nitrite, UA: NEGATIVE
PH UA: 7.5
Spec Grav, UA: <=1.005
Urobilinogen, UA: NEGATIVE

## 2014-04-01 MED ORDER — HYDROCODONE-ACETAMINOPHEN 5-325 MG PO TABS: ORAL_TABLET | ORAL | Status: DC

## 2014-04-01 NOTE — Progress Notes (Signed)
Subjective:    Patient ID: Teresa Holloway, female    DOB: [birth year], 73 y.o.   MRN: [id number]  Patient here today for follow up of chronic medical problems. NO changes since last visit- Patient c/o left mid back pain with urinary urgency and frequency.  Hypertension This is a chronic problem. The current episode started more than 1 year ago. The problem is controlled. Associated symptoms include palpitations. Pertinent negatives include no chest pain, headaches, neck pain or shortness of breath. Risk factors for coronary artery disease include dyslipidemia and post-menopausal state. Past treatments include ACE inhibitors and diuretics. The current treatment provides moderate improvement. Compliance problems include diet and exercise.   Hyperlipidemia This is a chronic problem. The current episode started more than 1 year ago. The problem is uncontrolled. Recent lipid tests were reviewed and are high. She has no history of diabetes or hypothyroidism. Pertinent negatives include no chest pain or shortness of breath. Current antihyperlipidemic treatment includes statins. The current treatment provides moderate improvement of lipids. Compliance problems include adherence to diet and adherence to exercise.  Risk factors for coronary artery disease include dyslipidemia and hypertension.  CAD/MI Currently on Imdurr - no C/O chest pain or SOB or Dyspnea- Sees Dr. Dorena Cookey sees yearly and just had follow-up in April. Depression Paxil daily  Which is working well for her- no c/o side effects Vitamin D deficiency  Vitamin D OTC- No side effects Osteoarthritis bil knees and hips- takes hydrocodone BID and says that can't walk without it due to the pain. GERD Zantac working well to keep symptoms under control. Osteoporosis Patient had  Bone density test in March of this year. Currently on  No meds.   Review of Systems  Constitutional: Negative.   HENT: Negative.   Respiratory: Negative for shortness  of breath.   Cardiovascular: Positive for palpitations. Negative for chest pain.  Genitourinary: Positive for dysuria, urgency and frequency.  Musculoskeletal: Negative for neck pain.  Neurological: Negative for headaches.  Psychiatric/Behavioral: Negative.   All other systems reviewed and are negative.      Objective:   Physical Exam  Constitutional: She is oriented to person, place, and time. She appears well-developed and well-nourished.  HENT:  Nose: Nose normal.  Mouth/Throat: Oropharynx is clear and moist.  Eyes: EOM are normal.  Neck: Trachea normal, normal range of motion and full passive range of motion without pain. Neck supple. No JVD present. Carotid bruit is not present. No thyromegaly present.  Cardiovascular: Normal rate, regular rhythm, normal heart sounds and intact distal pulses.  Exam reveals no gallop and no friction rub.   No murmur heard. Pulmonary/Chest: Effort normal and breath sounds normal.  Abdominal: Soft. Bowel sounds are normal. She exhibits no distension and no mass. There is no tenderness.  Musculoskeletal: Normal range of motion.  Lymphadenopathy:    She has no cervical adenopathy.  Neurological: She is alert and oriented to person, place, and time. She has normal reflexes.  Skin: Skin is warm and dry.  Psychiatric: She has a normal mood and affect. Her behavior is normal. Judgment and thought content normal.     BP 122/73 mmHg  Pulse 57  Temp(Src) 98.6 F (37 C) (Oral)  Ht 5\' 4"  (1.626 m)  Wt 175 lb (79.379 kg)  BMI 30.02 kg/m2 Results for orders placed or performed in visit on 04/01/14  POCT UA - Microscopic Only  Result Value Ref Range   WBC, Ur, HPF, POC RARE    RBC, urine, microscopic  10-12    Bacteria, U Microscopic NEG    Mucus, UA OCC    Epithelial cells, urine per micros OCC    Crystals, Ur, HPF, POC NEG    Casts, Ur, LPF, POC NEG    Yeast, UA NEG   POCT urinalysis dipstick  Result Value Ref Range   Color, UA YELLOW     Clarity, UA CLEAR    Glucose, UA NEG    Bilirubin, UA NEG    Ketones, UA NEG    Spec Grav, UA <=1.005    Blood, UA MOD    pH, UA 7.5    Protein, UA 3++    Urobilinogen, UA negative    Nitrite, UA NEG    Leukocytes, UA Negative    Chest x ray- no acute findings- Preliminary reading by Paulene Floor, FNP  [facility]      Assessment & Plan:   1. Dysuria Force fluids - POCT UA - Microscopic Only - POCT urinalysis dipstick  2. Osteoarthritis of left hip, unspecified osteoarthritis type - HYDROcodone-acetaminophen (NORCO/VICODIN) 5-325 MG per tablet; TAKE 1 TABLET BY MOUTH EVERY 6 HOURS AS NEEDED FOR PAIN  Dispense: 60 tablet; Refill: 0  3. Essential hypertension, benign Do not add salt to diet - CMP14+EGFR - DG Chest 2 View; Future  4. Coronary artery disease involving native coronary artery of native heart with angina pectoris with documented spasm Keep follow up appointment with cardiologist He does her EKG  5. Gastroesophageal reflux disease without esophagitis Avoid spicy and fatty foods  6. Hyperlipidemia with target LDL less than 100 Low fat diet - NMR, lipoprofile  7. Depression Stress management    Labs pending Health maintenance reviewed Diet and exercise encouraged Continue all meds Follow up  In 3 months   Mary-Margaret Daphine Deutscher, FNP

## 2014-04-01 NOTE — Patient Instructions (Signed)

## 2014-04-02 LAB — CMP14+EGFR
ALT: 11 IU/L (ref 0–32)
AST: 12 IU/L (ref 0–40)
Albumin/Globulin Ratio: 1.8 (ref 1.1–2.5)
Albumin: 4.4 g/dL (ref 3.5–4.8)
Alkaline Phosphatase: 89 IU/L (ref 39–117)
BUN/Creatinine Ratio: 20 (ref 11–26)
BUN: 16 mg/dL (ref 8–27)
CALCIUM: 9.9 mg/dL (ref 8.7–10.3)
CO2: 28 mmol/L (ref 18–29)
Chloride: 97 mmol/L (ref 97–108)
Creatinine, Ser: 0.79 mg/dL (ref 0.57–1.00)
GFR calc Af Amer: 86 mL/min/{1.73_m2} (ref >59)
GFR, EST NON AFRICAN AMERICAN: 75 mL/min/{1.73_m2} (ref >59)
Globulin, Total: 2.4 g/dL (ref 1.5–4.5)
Glucose: 95 mg/dL (ref 65–99)
Potassium: 4 mmol/L (ref 3.5–5.2)
SODIUM: 139 mmol/L (ref 134–144)
Total Bilirubin: 0.7 mg/dL (ref 0.0–1.2)
Total Protein: 6.8 g/dL (ref 6.0–8.5)

## 2014-04-02 LAB — NMR, LIPOPROFILE
Cholesterol: 159 mg/dL (ref 100–199)
HDL Cholesterol by NMR: 62 mg/dL (ref >39)
HDL PARTICLE NUMBER: 36 umol/L (ref >=30.5)
LDL Particle Number: 819 nmol/L (ref <1000)
LDL Size: 21.3 nm (ref >20.5)
LDL-C: 73 mg/dL (ref 0–99)
LP-IR Score: 30 (ref <=45)
SMALL LDL PARTICLE NUMBER: 249 nmol/L (ref <=527)
TRIGLYCERIDES BY NMR: 121 mg/dL (ref 0–149)

## 2014-04-29 ENCOUNTER — Telehealth: Payer: Self-pay | Admitting: Nurse Practitioner

## 2014-04-29 DIAGNOSIS — M1612 Unilateral primary osteoarthritis, left hip: Secondary | ICD-10-CM

## 2014-04-30 MED ORDER — HYDROCODONE-ACETAMINOPHEN 5-325 MG PO TABS: ORAL_TABLET | ORAL | Status: DC

## 2014-04-30 NOTE — Telephone Encounter (Signed)
norco rx ready for pick up  

## 2014-05-02 NOTE — Telephone Encounter (Signed)
Pt aware.

## 2014-05-30 ENCOUNTER — Other Ambulatory Visit: Payer: Self-pay | Admitting: Nurse Practitioner

## 2014-05-30 DIAGNOSIS — M1612 Unilateral primary osteoarthritis, left hip: Secondary | ICD-10-CM

## 2014-05-30 MED ORDER — HYDROCODONE-ACETAMINOPHEN 5-325 MG PO TABS: ORAL_TABLET | ORAL | Status: DC

## 2014-05-30 NOTE — Telephone Encounter (Signed)
Patient notified that rx up front ready to pick up

## 2014-05-30 NOTE — Telephone Encounter (Signed)
norco rx ready for pick up  

## 2014-06-21 ENCOUNTER — Other Ambulatory Visit: Payer: Self-pay | Admitting: Nurse Practitioner

## 2014-06-30 ENCOUNTER — Telehealth: Payer: Self-pay | Admitting: Nurse Practitioner

## 2014-06-30 DIAGNOSIS — M1612 Unilateral primary osteoarthritis, left hip: Secondary | ICD-10-CM

## 2014-06-30 MED ORDER — HYDROCODONE-ACETAMINOPHEN 5-325 MG PO TABS: ORAL_TABLET | ORAL | Status: DC

## 2014-06-30 NOTE — Telephone Encounter (Signed)
Hydrocodone rx ready for pick up 

## 2014-07-01 ENCOUNTER — Encounter: Payer: Self-pay | Admitting: Nurse Practitioner

## 2014-07-01 ENCOUNTER — Ambulatory Visit (INDEPENDENT_AMBULATORY_CARE_PROVIDER_SITE_OTHER): Payer: Medicare Other | Admitting: Nurse Practitioner

## 2014-07-01 VITALS — BP 116/66 | HR 56 | Temp 98.5°F | Ht 64.0 in | Wt 173.0 lb

## 2014-07-01 DIAGNOSIS — I25111 Atherosclerotic heart disease of native coronary artery with angina pectoris with documented spasm: Secondary | ICD-10-CM

## 2014-07-01 DIAGNOSIS — Z23 Encounter for immunization: Secondary | ICD-10-CM | POA: Diagnosis not present

## 2014-07-01 DIAGNOSIS — F32A Depression, unspecified: Secondary | ICD-10-CM

## 2014-07-01 DIAGNOSIS — I10 Essential (primary) hypertension: Secondary | ICD-10-CM | POA: Diagnosis not present

## 2014-07-01 DIAGNOSIS — F329 Major depressive disorder, single episode, unspecified: Secondary | ICD-10-CM

## 2014-07-01 DIAGNOSIS — K219 Gastro-esophageal reflux disease without esophagitis: Secondary | ICD-10-CM | POA: Diagnosis not present

## 2014-07-01 DIAGNOSIS — E785 Hyperlipidemia, unspecified: Secondary | ICD-10-CM

## 2014-07-01 MED ORDER — RANITIDINE HCL 75 MG PO TABS: 75.0000 mg | ORAL_TABLET | Freq: Every day | ORAL | Status: DC

## 2014-07-01 MED ORDER — CLOPIDOGREL BISULFATE 75 MG PO TABS: 75.0000 mg | ORAL_TABLET | Freq: Every day | ORAL | Status: DC

## 2014-07-01 MED ORDER — HYDROCHLOROTHIAZIDE 25 MG PO TABS
ORAL_TABLET | ORAL | Status: DC
Start: 2014-07-01 — End: 2015-01-02

## 2014-07-01 MED ORDER — BENAZEPRIL HCL 40 MG PO TABS: 40.0000 mg | ORAL_TABLET | Freq: Every day | ORAL | Status: DC

## 2014-07-01 MED ORDER — METOPROLOL SUCCINATE ER 100 MG PO TB24: 100.0000 mg | ORAL_TABLET | Freq: Every day | ORAL | Status: DC

## 2014-07-01 MED ORDER — ISOSORBIDE MONONITRATE ER 60 MG PO TB24
60.0000 mg | ORAL_TABLET | Freq: Every day | ORAL | Status: DC
Start: 2014-07-01 — End: 2015-01-02

## 2014-07-01 MED ORDER — AMLODIPINE BESYLATE 10 MG PO TABS: 10.0000 mg | ORAL_TABLET | Freq: Every day | ORAL | Status: DC

## 2014-07-01 MED ORDER — ATORVASTATIN CALCIUM 40 MG PO TABS: 40.0000 mg | ORAL_TABLET | Freq: Every day | ORAL | Status: DC

## 2014-07-01 MED ORDER — PAROXETINE HCL 40 MG PO TABS: ORAL_TABLET | ORAL | Status: DC

## 2014-07-01 NOTE — Addendum Note (Signed)
Addended by: Rolena Infante on: 07/01/2014 11:21 AM   Modules accepted: Orders

## 2014-07-01 NOTE — Progress Notes (Signed)
Subjective:    Patient ID: Teresa Holloway, female    DOB: 09-13-1940, 74 y.o.   MRN: [id number]  Patient here today for follow up of chronic medical problems. NO changes since last visit-   Hypertension This is a chronic problem. The current episode started more than 1 year ago. The problem is controlled. Associated symptoms include palpitations. Pertinent negatives include no chest pain, headaches, neck pain or shortness of breath. Risk factors for coronary artery disease include dyslipidemia and post-menopausal state. Past treatments include ACE inhibitors and diuretics. The current treatment provides moderate improvement. Compliance problems include diet and exercise.   Hyperlipidemia This is a chronic problem. The current episode started more than 1 year ago. The problem is uncontrolled. Recent lipid tests were reviewed and are high. She has no history of diabetes or hypothyroidism. Pertinent negatives include no chest pain or shortness of breath. Current antihyperlipidemic treatment includes statins. The current treatment provides moderate improvement of lipids. Compliance problems include adherence to diet and adherence to exercise.  Risk factors for coronary artery disease include dyslipidemia and hypertension.  CAD/MI Currently on Imdurr - no C/O chest pain or SOB or Dyspnea- Sees Dr. Dorena Cookey sees yearly and just had follow-up in April. Depression Paxil daily  Which is working well for her- no c/o side effects Vitamin D deficiency  Vitamin D OTC- No side effects Osteoarthritis bil knees and hips- takes hydrocodone BID and says that can't walk without it due to the pain. GERD Zantac working well to keep symptoms under control. Osteoporosis Patient had  Bone density test in March of this year. Currently on  No meds.   Review of Systems  Constitutional: Negative.   HENT: Negative.   Respiratory: Negative for shortness of breath.   Cardiovascular: Positive for palpitations.  Negative for chest pain.  Genitourinary: Positive for dysuria, urgency and frequency.  Musculoskeletal: Negative for neck pain.  Neurological: Negative for headaches.  Psychiatric/Behavioral: Negative.   All other systems reviewed and are negative.      Objective:   Physical Exam  Constitutional: She is oriented to person, place, and time. She appears well-developed and well-nourished.  HENT:  Nose: Nose normal.  Mouth/Throat: Oropharynx is clear and moist.  Eyes: EOM are normal.  Neck: Trachea normal, normal range of motion and full passive range of motion without pain. Neck supple. No JVD present. Carotid bruit is not present. No thyromegaly present.  Cardiovascular: Normal rate, regular rhythm, normal heart sounds and intact distal pulses.  Exam reveals no gallop and no friction rub.   No murmur heard. Pulmonary/Chest: Effort normal and breath sounds normal.  Abdominal: Soft. Bowel sounds are normal. She exhibits no distension and no mass. There is no tenderness.  Musculoskeletal: Normal range of motion.  Lymphadenopathy:    She has no cervical adenopathy.  Neurological: She is alert and oriented to person, place, and time. She has normal reflexes.  Skin: Skin is warm and dry.  Psychiatric: She has a normal mood and affect. Her behavior is normal. Judgment and thought content normal.     BP 116/66 mmHg  Pulse 56  Temp(Src) 98.5 F (36.9 C) (Oral)  Ht 5\' 4"  (1.626 m)  Wt 173 lb (78.472 kg)  BMI 29.68 kg/m2  EKG- sinus Leron Croak, FNP        Assessment & Plan:   1. Essential hypertension, benign Do not add slat to diet - EKG 12-Lead - CMP14+EGFR - amLODipine (NORVASC) 10 MG tablet; Take 1 tablet (10 mg  total) by mouth daily.  Dispense: 90 tablet; Refill: 1 - benazepril (LOTENSIN) 40 MG tablet; Take 1 tablet (40 mg total) by mouth daily.  Dispense: 90 tablet; Refill: 1 - hydrochlorothiazide (HYDRODIURIL) 25 MG tablet; TAKE 1 TABLET (25 MG  TOTAL) BY MOUTH DAILY.  Dispense: 90 tablet; Refill: 1  2. Coronary artery disease involving native coronary artery of native heart with angina pectoris with documented spasm  - clopidogrel (PLAVIX) 75 MG tablet; Take 1 tablet (75 mg total) by mouth daily.  Dispense: 90 tablet; Refill: 1 - isosorbide mononitrate (IMDUR) 60 MG 24 hr tablet; Take 1 tablet (60 mg total) by mouth daily.  Dispense: 90 tablet; Refill: 1 - metoprolol succinate (TOPROL-XL) 100 MG 24 hr tablet; Take 1 tablet (100 mg total) by mouth daily.  Dispense: 90 tablet; Refill: 1  3. Gastroesophageal reflux disease without esophagitis Avoid spicy foods Do not eat 2 hours prior to bedtime - ranitidine (ZANTAC) 75 MG tablet; Take 1 tablet (75 mg total) by mouth daily.  Dispense: 90 tablet; Refill: 1  4. Hyperlipidemia with target LDL less than 100 Low fta diet - NMR, lipoprofile - atorvastatin (LIPITOR) 40 MG tablet; Take 1 tablet (40 mg total) by mouth daily.  Dispense: 90 tablet; Refill: 1  5. Depression Stress management - PARoxetine (PAXIL) 40 MG tablet; TAKE 1 TABLET EVERY DAY  Dispense: 90 tablet; Refill: 1  prevnar 13 Labs pending Health maintenance reviewed Diet and exercise encouraged Continue all meds Follow up  In 3 months   Mary-Margaret Daphine Deutscher, FNP

## 2014-07-02 LAB — NMR, LIPOPROFILE
CHOLESTEROL: 138 mg/dL (ref 100–199)
HDL CHOLESTEROL BY NMR: 68 mg/dL (ref >39)
HDL Particle Number: 33.4 umol/L (ref >=30.5)
LDL Particle Number: 674 nmol/L (ref <1000)
LDL SIZE: 21.3 nm (ref >20.5)
LDL-C: 50 mg/dL (ref 0–99)
LP-IR Score: <25 (ref <=45)
SMALL LDL PARTICLE NUMBER: 182 nmol/L (ref <=527)
TRIGLYCERIDES BY NMR: 102 mg/dL (ref 0–149)

## 2014-07-02 LAB — CMP14+EGFR
A/G RATIO: 1.8 (ref 1.1–2.5)
ALBUMIN: 4.2 g/dL (ref 3.5–4.8)
ALT: 8 IU/L (ref 0–32)
AST: 15 IU/L (ref 0–40)
Alkaline Phosphatase: 92 IU/L (ref 39–117)
BILIRUBIN TOTAL: 0.3 mg/dL (ref 0.0–1.2)
BUN/Creatinine Ratio: 16 (ref 11–26)
BUN: 14 mg/dL (ref 8–27)
CALCIUM: 9.4 mg/dL (ref 8.7–10.3)
CO2: 29 mmol/L (ref 18–29)
CREATININE: 0.89 mg/dL (ref 0.57–1.00)
Chloride: 99 mmol/L (ref 97–108)
GFR calc Af Amer: 75 mL/min/{1.73_m2} (ref >59)
GFR calc non Af Amer: 65 mL/min/{1.73_m2} (ref >59)
GLUCOSE: 109 mg/dL — AB (ref 65–99)
Globulin, Total: 2.3 g/dL (ref 1.5–4.5)
POTASSIUM: 3.8 mmol/L (ref 3.5–5.2)
SODIUM: 142 mmol/L (ref 134–144)
TOTAL PROTEIN: 6.5 g/dL (ref 6.0–8.5)

## 2014-07-04 ENCOUNTER — Encounter: Payer: Self-pay | Admitting: Nurse Practitioner

## 2014-07-21 ENCOUNTER — Encounter: Payer: Self-pay | Admitting: *Deleted

## 2014-07-28 ENCOUNTER — Other Ambulatory Visit: Payer: Self-pay | Admitting: Nurse Practitioner

## 2014-07-28 DIAGNOSIS — M1612 Unilateral primary osteoarthritis, left hip: Secondary | ICD-10-CM

## 2014-07-28 MED ORDER — HYDROCODONE-ACETAMINOPHEN 5-325 MG PO TABS: ORAL_TABLET | ORAL | Status: DC

## 2014-07-28 NOTE — Telephone Encounter (Signed)
Hydrocodone rx ready for pick up 

## 2014-08-07 ENCOUNTER — Other Ambulatory Visit: Payer: Self-pay | Admitting: Nurse Practitioner

## 2014-08-11 NOTE — Telephone Encounter (Signed)
RX done. 

## 2014-08-15 NOTE — Telephone Encounter (Signed)
RX done. 

## 2014-08-24 DIAGNOSIS — R001 Bradycardia, unspecified: Secondary | ICD-10-CM | POA: Diagnosis not present

## 2014-08-24 DIAGNOSIS — I2510 Atherosclerotic heart disease of native coronary artery without angina pectoris: Secondary | ICD-10-CM | POA: Diagnosis not present

## 2014-08-24 DIAGNOSIS — I209 Angina pectoris, unspecified: Secondary | ICD-10-CM | POA: Diagnosis not present

## 2014-08-24 DIAGNOSIS — R079 Chest pain, unspecified: Secondary | ICD-10-CM | POA: Diagnosis not present

## 2014-08-24 DIAGNOSIS — I119 Hypertensive heart disease without heart failure: Secondary | ICD-10-CM | POA: Diagnosis not present

## 2014-08-24 DIAGNOSIS — I252 Old myocardial infarction: Secondary | ICD-10-CM | POA: Diagnosis not present

## 2014-08-25 ENCOUNTER — Other Ambulatory Visit: Payer: Self-pay | Admitting: Nurse Practitioner

## 2014-08-25 DIAGNOSIS — M1612 Unilateral primary osteoarthritis, left hip: Secondary | ICD-10-CM

## 2014-08-25 MED ORDER — HYDROCODONE-ACETAMINOPHEN 5-325 MG PO TABS: ORAL_TABLET | ORAL | Status: DC

## 2014-08-25 NOTE — Telephone Encounter (Signed)
norco rx ready for pick up  

## 2014-08-25 NOTE — Telephone Encounter (Signed)
Pt aware written Rx at front desk ready for pickup

## 2014-08-30 NOTE — Telephone Encounter (Signed)
RX done 08-15-14

## 2014-09-22 ENCOUNTER — Telehealth: Payer: Self-pay | Admitting: Nurse Practitioner

## 2014-09-22 DIAGNOSIS — M1612 Unilateral primary osteoarthritis, left hip: Secondary | ICD-10-CM

## 2014-09-22 MED ORDER — HYDROCODONE-ACETAMINOPHEN 5-325 MG PO TABS: ORAL_TABLET | ORAL | Status: DC

## 2014-09-22 NOTE — Telephone Encounter (Signed)
Pt aware.

## 2014-09-22 NOTE — Telephone Encounter (Signed)
Pain rx ready for pick up  

## 2014-10-19 ENCOUNTER — Other Ambulatory Visit: Payer: Self-pay | Admitting: Nurse Practitioner

## 2014-10-19 DIAGNOSIS — M1612 Unilateral primary osteoarthritis, left hip: Secondary | ICD-10-CM

## 2014-10-19 MED ORDER — HYDROCODONE-ACETAMINOPHEN 5-325 MG PO TABS: ORAL_TABLET | ORAL | Status: DC

## 2014-10-19 NOTE — Telephone Encounter (Signed)
Pt notified Rx is ready for pick up.

## 2014-10-19 NOTE — Telephone Encounter (Signed)
norco rx ready for pick up  

## 2014-11-18 ENCOUNTER — Telehealth: Payer: Self-pay | Admitting: Nurse Practitioner

## 2014-11-18 DIAGNOSIS — M1612 Unilateral primary osteoarthritis, left hip: Secondary | ICD-10-CM

## 2014-11-18 MED ORDER — HYDROCODONE-ACETAMINOPHEN 5-325 MG PO TABS: ORAL_TABLET | ORAL | Status: DC

## 2014-11-18 NOTE — Telephone Encounter (Signed)
lmovm that written Rx is at front desk ready for pickup 

## 2014-11-18 NOTE — Telephone Encounter (Signed)
Pian rx ready for pick up

## 2014-12-20 ENCOUNTER — Telehealth: Payer: Self-pay | Admitting: Nurse Practitioner

## 2014-12-20 DIAGNOSIS — M1612 Unilateral primary osteoarthritis, left hip: Secondary | ICD-10-CM

## 2014-12-20 MED ORDER — HYDROCODONE-ACETAMINOPHEN 5-325 MG PO TABS: ORAL_TABLET | ORAL | Status: DC

## 2014-12-20 NOTE — Telephone Encounter (Signed)
norco rx ready for pick up  

## 2015-01-02 ENCOUNTER — Encounter: Payer: Self-pay | Admitting: Nurse Practitioner

## 2015-01-02 ENCOUNTER — Ambulatory Visit (INDEPENDENT_AMBULATORY_CARE_PROVIDER_SITE_OTHER): Payer: Medicare Other | Admitting: Nurse Practitioner

## 2015-01-02 VITALS — BP 119/68 | HR 53 | Temp 97.9°F | Ht 64.0 in | Wt 176.0 lb

## 2015-01-02 DIAGNOSIS — F32A Depression, unspecified: Secondary | ICD-10-CM

## 2015-01-02 DIAGNOSIS — M810 Age-related osteoporosis without current pathological fracture: Secondary | ICD-10-CM | POA: Diagnosis not present

## 2015-01-02 DIAGNOSIS — E785 Hyperlipidemia, unspecified: Secondary | ICD-10-CM | POA: Diagnosis not present

## 2015-01-02 DIAGNOSIS — K219 Gastro-esophageal reflux disease without esophagitis: Secondary | ICD-10-CM | POA: Diagnosis not present

## 2015-01-02 DIAGNOSIS — F329 Major depressive disorder, single episode, unspecified: Secondary | ICD-10-CM

## 2015-01-02 DIAGNOSIS — R300 Dysuria: Secondary | ICD-10-CM

## 2015-01-02 DIAGNOSIS — I25111 Atherosclerotic heart disease of native coronary artery with angina pectoris with documented spasm: Secondary | ICD-10-CM | POA: Diagnosis not present

## 2015-01-02 DIAGNOSIS — Z6830 Body mass index (BMI) 30.0-30.9, adult: Secondary | ICD-10-CM | POA: Diagnosis not present

## 2015-01-02 DIAGNOSIS — Z1212 Encounter for screening for malignant neoplasm of rectum: Secondary | ICD-10-CM

## 2015-01-02 DIAGNOSIS — I10 Essential (primary) hypertension: Secondary | ICD-10-CM

## 2015-01-02 LAB — POCT UA - MICROSCOPIC ONLY
CASTS, UR, LPF, POC: NEGATIVE
Crystals, Ur, HPF, POC: NEGATIVE
MUCUS UA: NEGATIVE
YEAST UA: NEGATIVE

## 2015-01-02 LAB — POCT URINALYSIS DIPSTICK
Bilirubin, UA: NEGATIVE
GLUCOSE UA: NEGATIVE
KETONES UA: NEGATIVE
Leukocytes, UA: NEGATIVE
Nitrite, UA: NEGATIVE
PH UA: 8
Spec Grav, UA: <=1.005
Urobilinogen, UA: NEGATIVE

## 2015-01-02 MED ORDER — AMLODIPINE BESYLATE 10 MG PO TABS: 10.0000 mg | ORAL_TABLET | Freq: Every day | ORAL | Status: DC

## 2015-01-02 MED ORDER — ATORVASTATIN CALCIUM 40 MG PO TABS: 40.0000 mg | ORAL_TABLET | Freq: Every day | ORAL | Status: DC

## 2015-01-02 MED ORDER — RANITIDINE HCL 75 MG PO TABS: 75.0000 mg | ORAL_TABLET | Freq: Every day | ORAL | Status: DC

## 2015-01-02 MED ORDER — METOPROLOL SUCCINATE ER 100 MG PO TB24: 100.0000 mg | ORAL_TABLET | Freq: Every day | ORAL | Status: DC

## 2015-01-02 MED ORDER — CLOPIDOGREL BISULFATE 75 MG PO TABS: 75.0000 mg | ORAL_TABLET | Freq: Every day | ORAL | Status: DC

## 2015-01-02 MED ORDER — HYDROCHLOROTHIAZIDE 25 MG PO TABS: ORAL_TABLET | ORAL | Status: DC

## 2015-01-02 MED ORDER — BENAZEPRIL HCL 40 MG PO TABS: 40.0000 mg | ORAL_TABLET | Freq: Every day | ORAL | Status: DC

## 2015-01-02 MED ORDER — ISOSORBIDE MONONITRATE ER 60 MG PO TB24: 60.0000 mg | ORAL_TABLET | Freq: Every day | ORAL | Status: DC

## 2015-01-02 MED ORDER — PAROXETINE HCL 40 MG PO TABS: ORAL_TABLET | ORAL | Status: DC

## 2015-01-02 NOTE — Patient Instructions (Signed)

## 2015-01-02 NOTE — Progress Notes (Signed)
Subjective:    Patient ID: Teresa Holloway, female    DOB: August 12, 1940, 74 y.o.   MRN: [id number]  Patient here today for follow up of chronic medical problems. Pt reports dysuria for about 2 weeks. Symptoms have gotten worse recently, no hematuria. Mo OTC medications tried at home. No pain today just reports burning.    Hypertension This is a chronic problem. The current episode started more than 1 year ago. The problem is controlled. Pertinent negatives include no chest pain, headaches, neck pain or shortness of breath. Risk factors for coronary artery disease include dyslipidemia and post-menopausal state. Past treatments include ACE inhibitors, diuretics and beta blockers. The current treatment provides moderate improvement. Compliance problems include diet and exercise.   Hyperlipidemia This is a chronic problem. The current episode started more than 1 year ago. The problem is uncontrolled. Recent lipid tests were reviewed and are high. She has no history of diabetes or hypothyroidism. Pertinent negatives include no chest pain or shortness of breath. Current antihyperlipidemic treatment includes statins. The current treatment provides moderate improvement of lipids. Compliance problems include adherence to diet and adherence to exercise.  Risk factors for coronary artery disease include dyslipidemia and hypertension.  CAD/MI Currently on Imdurr - no C/O chest pain or SOB or Dyspnea- Reports she saw her cardiologist back in Feb and had a good report Depression Paxil daily  Which is working well for her- no c/o side effects Vitamin D deficiency  Vitamin D OTC- No side effects Osteoarthritis bil knees and hips- takes hydrocodone BID and says that can't walk without it due to the pain. GERD Zantac working well to keep symptoms under control. Osteoporosis Patient had  Bone density test in March of this year.     Review of Systems  Constitutional: Negative.   HENT: Negative.   Respiratory:  Negative for shortness of breath.   Cardiovascular: Negative for chest pain.  Genitourinary: Positive for dysuria, urgency and frequency.  Musculoskeletal: Negative for neck pain.  Neurological: Negative for headaches.  Psychiatric/Behavioral: Negative.   All other systems reviewed and are negative.      Objective:   Physical Exam  Constitutional: She is oriented to person, place, and time. She appears well-developed and well-nourished.  HENT:  Nose: Nose normal.  Mouth/Throat: Oropharynx is clear and moist.  Eyes: EOM are normal.  Neck: Trachea normal, normal range of motion and full passive range of motion without pain. Neck supple. No JVD present. Carotid bruit is not present. No thyromegaly present.  Cardiovascular: Normal rate, regular rhythm, normal heart sounds and intact distal pulses.  Exam reveals no gallop and no friction rub.   No murmur heard. Pulmonary/Chest: Effort normal and breath sounds normal.  Abdominal: Soft. Bowel sounds are normal. She exhibits no distension and no mass. There is no tenderness.  Musculoskeletal: Normal range of motion.  Lymphadenopathy:    She has no cervical adenopathy.  Neurological: She is alert and oriented to person, place, and time. She has normal reflexes.  Skin: Skin is warm and dry.  Psychiatric: She has a normal mood and affect. Her behavior is normal. Judgment and thought content normal.     BP 119/68 mmHg  Pulse 53  Temp(Src) 97.9 F (36.6 C) (Oral)  Ht 5' 4" (1.626 m)  Wt 176 lb (79.833 kg)  BMI 30.20 kg/m2   Results for orders placed or performed in visit on 01/02/15  POCT UA - Microscopic Only  Result Value Ref Range   WBC, Ur, HPF,  Subjective:    Patient ID: Teresa Holloway, female    DOB: August 12, 1940, 74 y.o.   MRN: [id number]  Patient here today for follow up of chronic medical problems. Pt reports dysuria for about 2 weeks. Symptoms have gotten worse recently, no hematuria. Mo OTC medications tried at home. No pain today just reports burning.    Hypertension This is a chronic problem. The current episode started more than 1 year ago. The problem is controlled. Pertinent negatives include no chest pain, headaches, neck pain or shortness of breath. Risk factors for coronary artery disease include dyslipidemia and post-menopausal state. Past treatments include ACE inhibitors, diuretics and beta blockers. The current treatment provides moderate improvement. Compliance problems include diet and exercise.   Hyperlipidemia This is a chronic problem. The current episode started more than 1 year ago. The problem is uncontrolled. Recent lipid tests were reviewed and are high. She has no history of diabetes or hypothyroidism. Pertinent negatives include no chest pain or shortness of breath. Current antihyperlipidemic treatment includes statins. The current treatment provides moderate improvement of lipids. Compliance problems include adherence to diet and adherence to exercise.  Risk factors for coronary artery disease include dyslipidemia and hypertension.  CAD/MI Currently on Imdurr - no C/O chest pain or SOB or Dyspnea- Reports she saw her cardiologist back in Feb and had a good report Depression Paxil daily  Which is working well for her- no c/o side effects Vitamin D deficiency  Vitamin D OTC- No side effects Osteoarthritis bil knees and hips- takes hydrocodone BID and says that can't walk without it due to the pain. GERD Zantac working well to keep symptoms under control. Osteoporosis Patient had  Bone density test in March of this year.     Review of Systems  Constitutional: Negative.   HENT: Negative.   Respiratory:  Negative for shortness of breath.   Cardiovascular: Negative for chest pain.  Genitourinary: Positive for dysuria, urgency and frequency.  Musculoskeletal: Negative for neck pain.  Neurological: Negative for headaches.  Psychiatric/Behavioral: Negative.   All other systems reviewed and are negative.      Objective:   Physical Exam  Constitutional: She is oriented to person, place, and time. She appears well-developed and well-nourished.  HENT:  Nose: Nose normal.  Mouth/Throat: Oropharynx is clear and moist.  Eyes: EOM are normal.  Neck: Trachea normal, normal range of motion and full passive range of motion without pain. Neck supple. No JVD present. Carotid bruit is not present. No thyromegaly present.  Cardiovascular: Normal rate, regular rhythm, normal heart sounds and intact distal pulses.  Exam reveals no gallop and no friction rub.   No murmur heard. Pulmonary/Chest: Effort normal and breath sounds normal.  Abdominal: Soft. Bowel sounds are normal. She exhibits no distension and no mass. There is no tenderness.  Musculoskeletal: Normal range of motion.  Lymphadenopathy:    She has no cervical adenopathy.  Neurological: She is alert and oriented to person, place, and time. She has normal reflexes.  Skin: Skin is warm and dry.  Psychiatric: She has a normal mood and affect. Her behavior is normal. Judgment and thought content normal.     BP 119/68 mmHg  Pulse 53  Temp(Src) 97.9 F (36.6 C) (Oral)  Ht 5' 4" (1.626 m)  Wt 176 lb (79.833 kg)  BMI 30.20 kg/m2   Results for orders placed or performed in visit on 01/02/15  POCT UA - Microscopic Only  Result Value Ref Range   WBC, Ur, HPF,

## 2015-01-03 LAB — CMP14+EGFR
A/G RATIO: 1.7 (ref 1.1–2.5)
ALT: 11 IU/L (ref 0–32)
AST: 9 IU/L (ref 0–40)
Albumin: 4.1 g/dL (ref 3.5–4.8)
Alkaline Phosphatase: 97 IU/L (ref 39–117)
BILIRUBIN TOTAL: 0.5 mg/dL (ref 0.0–1.2)
BUN/Creatinine Ratio: 23 (ref 11–26)
BUN: 19 mg/dL (ref 8–27)
CALCIUM: 9.4 mg/dL (ref 8.7–10.3)
CHLORIDE: 98 mmol/L (ref 97–108)
CO2: 28 mmol/L (ref 18–29)
Creatinine, Ser: 0.83 mg/dL (ref 0.57–1.00)
GFR, EST AFRICAN AMERICAN: 81 mL/min/{1.73_m2} (ref >59)
GFR, EST NON AFRICAN AMERICAN: 71 mL/min/{1.73_m2} (ref >59)
GLOBULIN, TOTAL: 2.4 g/dL (ref 1.5–4.5)
Glucose: 98 mg/dL (ref 65–99)
POTASSIUM: 3.9 mmol/L (ref 3.5–5.2)
SODIUM: 139 mmol/L (ref 134–144)
TOTAL PROTEIN: 6.5 g/dL (ref 6.0–8.5)

## 2015-01-03 LAB — LIPID PANEL
CHOL/HDL RATIO: 2.1 ratio (ref 0.0–4.4)
Cholesterol, Total: 134 mg/dL (ref 100–199)
HDL: 63 mg/dL (ref >39)
LDL Calculated: 49 mg/dL (ref 0–99)
Triglycerides: 108 mg/dL (ref 0–149)
VLDL Cholesterol Cal: 22 mg/dL (ref 5–40)

## 2015-01-12 DIAGNOSIS — L570 Actinic keratosis: Secondary | ICD-10-CM | POA: Diagnosis not present

## 2015-01-12 DIAGNOSIS — D045 Carcinoma in situ of skin of trunk: Secondary | ICD-10-CM | POA: Diagnosis not present

## 2015-01-12 DIAGNOSIS — L820 Inflamed seborrheic keratosis: Secondary | ICD-10-CM | POA: Diagnosis not present

## 2015-01-12 DIAGNOSIS — X32XXXD Exposure to sunlight, subsequent encounter: Secondary | ICD-10-CM | POA: Diagnosis not present

## 2015-01-19 ENCOUNTER — Other Ambulatory Visit: Payer: Self-pay | Admitting: Nurse Practitioner

## 2015-01-19 DIAGNOSIS — M1612 Unilateral primary osteoarthritis, left hip: Secondary | ICD-10-CM

## 2015-01-19 MED ORDER — HYDROCODONE-ACETAMINOPHEN 5-325 MG PO TABS: ORAL_TABLET | ORAL | Status: DC

## 2015-01-19 NOTE — Telephone Encounter (Signed)
#  60 tabs for 30 days prescription I am printing, pt can pick up.

## 2015-01-19 NOTE — Telephone Encounter (Signed)
RX done and put up front 11-18-14

## 2015-01-19 NOTE — Telephone Encounter (Signed)
MMM pt. Last filled 12/20/14, last seen 01/02/15. Rx will print

## 2015-02-20 ENCOUNTER — Ambulatory Visit (INDEPENDENT_AMBULATORY_CARE_PROVIDER_SITE_OTHER): Payer: Medicare Other

## 2015-02-20 ENCOUNTER — Telehealth: Payer: Self-pay | Admitting: Nurse Practitioner

## 2015-02-20 DIAGNOSIS — Z23 Encounter for immunization: Secondary | ICD-10-CM | POA: Diagnosis not present

## 2015-02-20 DIAGNOSIS — M1612 Unilateral primary osteoarthritis, left hip: Secondary | ICD-10-CM

## 2015-02-20 MED ORDER — HYDROCODONE-ACETAMINOPHEN 5-325 MG PO TABS: ORAL_TABLET | ORAL | Status: DC

## 2015-02-20 NOTE — Telephone Encounter (Signed)
Pt aware written Rx is at front desk ready for pickup  

## 2015-02-20 NOTE — Telephone Encounter (Signed)
Pain rx ready for pick up  

## 2015-03-21 ENCOUNTER — Telehealth: Payer: Self-pay | Admitting: Nurse Practitioner

## 2015-03-21 ENCOUNTER — Encounter: Payer: Self-pay | Admitting: Family

## 2015-03-21 ENCOUNTER — Ambulatory Visit (INDEPENDENT_AMBULATORY_CARE_PROVIDER_SITE_OTHER): Payer: Medicare Other | Admitting: Family

## 2015-03-21 VITALS — BP 133/77 | HR 64 | Temp 97.9°F | Ht 64.0 in | Wt 175.0 lb

## 2015-03-21 DIAGNOSIS — N3000 Acute cystitis without hematuria: Secondary | ICD-10-CM

## 2015-03-21 DIAGNOSIS — R300 Dysuria: Secondary | ICD-10-CM

## 2015-03-21 DIAGNOSIS — I25111 Atherosclerotic heart disease of native coronary artery with angina pectoris with documented spasm: Secondary | ICD-10-CM

## 2015-03-21 DIAGNOSIS — M1612 Unilateral primary osteoarthritis, left hip: Secondary | ICD-10-CM

## 2015-03-21 LAB — POCT URINALYSIS DIPSTICK
BILIRUBIN UA: NEGATIVE
GLUCOSE UA: NEGATIVE
Ketones, UA: NEGATIVE
NITRITE UA: POSITIVE
Protein, UA: NEGATIVE
Spec Grav, UA: 1.025
Urobilinogen, UA: NEGATIVE
pH, UA: 6.5

## 2015-03-21 LAB — POCT UA - MICROSCOPIC ONLY
CRYSTALS, UR, HPF, POC: NEGATIVE
Casts, Ur, LPF, POC: NEGATIVE
MUCUS UA: NEGATIVE
Yeast, UA: NEGATIVE

## 2015-03-21 MED ORDER — HYDROCODONE-ACETAMINOPHEN 5-325 MG PO TABS: ORAL_TABLET | ORAL | Status: DC

## 2015-03-21 MED ORDER — SULFAMETHOXAZOLE-TRIMETHOPRIM 800-160 MG PO TABS: 1.0000 | ORAL_TABLET | Freq: Two times a day (BID) | ORAL | Status: DC

## 2015-03-21 NOTE — Patient Instructions (Signed)

## 2015-03-21 NOTE — Progress Notes (Signed)
Subjective:    Patient ID: Teresa Holloway, female    DOB: 03/08/1941, 74 y.o.   MRN: [id number]  Dysuria  This is a new problem. The current episode started yesterday. The problem occurs intermittently. The problem has been gradually worsening. The quality of the pain is described as aching. The pain is at a severity of 6/10. The pain is mild. There has been no fever. Associated symptoms include chills, flank pain, frequency, hesitancy and urgency. Pertinent negatives include no discharge, hematuria, nausea or vomiting. She has tried increased fluids (AZO) for the symptoms. The treatment provided mild relief.      Review of Systems  Constitutional: Positive for chills.  HENT: Negative.   Eyes: Negative.   Respiratory: Negative.  Negative for shortness of breath.   Cardiovascular: Negative.  Negative for palpitations.  Gastrointestinal: Negative.  Negative for nausea and vomiting.  Endocrine: Negative.   Genitourinary: Positive for dysuria, hesitancy, urgency, frequency and flank pain. Negative for hematuria.  Musculoskeletal: Negative.   Neurological: Negative.  Negative for headaches.  Hematological: Negative.   Psychiatric/Behavioral: Negative.   All other systems reviewed and are negative.      Objective:   Physical Exam  Constitutional: She is oriented to person, place, and time. She appears well-developed and well-nourished. No distress.  HENT:  Head: Normocephalic and atraumatic.  Eyes: Pupils are equal, round, and reactive to light.  Neck: Normal range of motion. Neck supple. No thyromegaly present.  Cardiovascular: Normal rate, regular rhythm, normal heart sounds and intact distal pulses.   No murmur heard. Pulmonary/Chest: Effort normal and breath sounds normal. No respiratory distress. She has no wheezes.  Abdominal: Soft. Bowel sounds are normal. She exhibits no distension. There is no tenderness.  Musculoskeletal: Normal range of motion. She exhibits no edema or  tenderness.  Negative for CVA tenderness   Neurological: She is alert and oriented to person, place, and time. She has normal reflexes. No cranial nerve deficit.  Skin: Skin is warm and dry.  Psychiatric: She has a normal mood and affect. Her behavior is normal. Judgment and thought content normal.  Vitals reviewed.     BP 133/77 mmHg  Pulse 64  Temp(Src) 97.9 F (36.6 C) (Oral)  Ht 5\' 4"  (1.626 m)  Wt 175 lb (79.379 kg)  BMI 30.02 kg/m2     Assessment & Plan:  1. Dysuria - POCT urinalysis dipstick - POCT UA - Microscopic Only  2. Acute cystitis without hematuria -Force fluids AZO over the counter X2 days RTO prn Culture pending - sulfamethoxazole-trimethoprim (BACTRIM DS,SEPTRA DS) 800-160 MG tablet; Take 1 tablet by mouth 2 (two) times daily.  Dispense: 10 tablet; Refill: 0 - Urine culture  Jannifer Rodney, FNP

## 2015-03-21 NOTE — Telephone Encounter (Signed)
Patient aware, script placed at front desk

## 2015-03-21 NOTE — Telephone Encounter (Signed)
Pain rx ready for pick up- we are making changes to our pain policy and she will need to be seen for next refill to discuss.

## 2015-03-22 ENCOUNTER — Telehealth: Payer: Self-pay | Admitting: Nurse Practitioner

## 2015-03-23 ENCOUNTER — Other Ambulatory Visit: Payer: Self-pay | Admitting: Nurse Practitioner

## 2015-03-23 MED ORDER — CIPROFLOXACIN HCL 500 MG PO TABS: 500.0000 mg | ORAL_TABLET | Freq: Two times a day (BID) | ORAL | Status: DC

## 2015-03-23 NOTE — Telephone Encounter (Signed)
Stop bactrim rx for cipro sent to pahrmacy

## 2015-03-23 NOTE — Telephone Encounter (Signed)
Patient states that the Bactrim is making her sick to her stomach. She is eating when she takes. Please advise

## 2015-03-23 NOTE — Telephone Encounter (Signed)
Patient aware and verbalizes understanding. 

## 2015-03-24 LAB — URINE CULTURE

## 2015-04-13 ENCOUNTER — Encounter: Payer: Self-pay | Admitting: Nurse Practitioner

## 2015-04-13 ENCOUNTER — Ambulatory Visit (INDEPENDENT_AMBULATORY_CARE_PROVIDER_SITE_OTHER): Payer: Medicare Other | Admitting: Nurse Practitioner

## 2015-04-13 VITALS — BP 125/67 | HR 55 | Temp 98.5°F | Ht 64.0 in | Wt 173.0 lb

## 2015-04-13 DIAGNOSIS — I252 Old myocardial infarction: Secondary | ICD-10-CM | POA: Diagnosis not present

## 2015-04-13 DIAGNOSIS — M1612 Unilateral primary osteoarthritis, left hip: Secondary | ICD-10-CM | POA: Diagnosis not present

## 2015-04-13 DIAGNOSIS — M810 Age-related osteoporosis without current pathological fracture: Secondary | ICD-10-CM | POA: Diagnosis not present

## 2015-04-13 DIAGNOSIS — I25111 Atherosclerotic heart disease of native coronary artery with angina pectoris with documented spasm: Secondary | ICD-10-CM | POA: Diagnosis not present

## 2015-04-13 DIAGNOSIS — E785 Hyperlipidemia, unspecified: Secondary | ICD-10-CM

## 2015-04-13 DIAGNOSIS — K219 Gastro-esophageal reflux disease without esophagitis: Secondary | ICD-10-CM | POA: Diagnosis not present

## 2015-04-13 DIAGNOSIS — Z6829 Body mass index (BMI) 29.0-29.9, adult: Secondary | ICD-10-CM | POA: Diagnosis not present

## 2015-04-13 DIAGNOSIS — F329 Major depressive disorder, single episode, unspecified: Secondary | ICD-10-CM

## 2015-04-13 DIAGNOSIS — I10 Essential (primary) hypertension: Secondary | ICD-10-CM

## 2015-04-13 DIAGNOSIS — F32A Depression, unspecified: Secondary | ICD-10-CM

## 2015-04-13 MED ORDER — HYDROCODONE-ACETAMINOPHEN 5-325 MG PO TABS: 1.0000 | ORAL_TABLET | Freq: Four times a day (QID) | ORAL | Status: DC | PRN

## 2015-04-13 MED ORDER — HYDROCODONE-ACETAMINOPHEN 5-325 MG PO TABS: ORAL_TABLET | ORAL | Status: DC

## 2015-04-13 NOTE — Patient Instructions (Signed)
Bone Health Bones protect organs, store calcium, and anchor muscles. Good health habits, such as eating nutritious foods and exercising regularly, are important for maintaining healthy bones. They can also help to prevent a condition that causes bones to lose density and become weak and brittle (osteoporosis). WHY IS BONE MASS IMPORTANT? Bone mass refers to the amount of bone tissue that you have. The higher your bone mass, the stronger your bones. An important step toward having healthy bones throughout life is to have strong and dense bones during childhood. A young adult who has a high bone mass is more likely to have a high bone mass later in life. Bone mass at its greatest it is called peak bone mass. A large decline in bone mass occurs in older adults. In women, it occurs about the time of menopause. During this time, it is important to practice good health habits, because if more bone is lost than what is replaced, the bones will become less healthy and more likely to break (fracture). If you find that you have a low bone mass, you may be able to prevent osteoporosis or further bone loss by changing your diet and lifestyle. HOW CAN I FIND OUT IF MY BONE MASS IS LOW? Bone mass can be measured with an X-ray test that is called a bone mineral density (BMD) test. This test is recommended for all women who are age 65 or older. It may also be recommended for men who are age 70 or older, or for people who are more likely to develop osteoporosis due to:  Having bones that break easily.  Having a long-term disease that weakens bones, such as kidney disease or rheumatoid arthritis.  Having menopause earlier than normal.  Taking medicine that weakens bones, such as steroids, thyroid hormones, or hormone treatment for breast cancer or prostate cancer.  Smoking.  Drinking three or more alcoholic drinks each day. WHAT ARE THE NUTRITIONAL RECOMMENDATIONS FOR HEALTHY BONES? To have healthy bones, you need  to get enough of the right minerals and vitamins. Most nutrition experts recommend getting these nutrients from the foods that you eat. Nutritional recommendations vary from person to person. Ask your health care provider what is healthy for you. Here are some general guidelines. Calcium Recommendations Calcium is the most important (essential) mineral for bone health. Most people can get enough calcium from their diet, but supplements may be recommended for people who are at risk for osteoporosis. Good sources of calcium include:  Dairy products, such as low-fat or nonfat milk, cheese, and yogurt.  Dark green leafy vegetables, such as bok choy and broccoli.  Calcium-fortified foods, such as orange juice, cereal, bread, soy beverages, and tofu products.  Nuts, such as almonds. Follow these recommended amounts for daily calcium intake:  Children, age 1-3: 700 mg.  Children, age 4-8: 1,000 mg.  Children, age 9-13: 1,300 mg.  Teens, age 14-18: 1,300 mg.  Adults, age 19-50: 1,000 mg.  Adults, age 51-70:  Men: 1,000 mg.  Women: 1,200 mg.  Adults, age 71 or older: 1,200 mg.  Pregnant and breastfeeding females:  Teens: 1,300 mg.  Adults: 1,000 mg. Vitamin D Recommendations Vitamin D is the most essential vitamin for bone health. It helps the body to absorb calcium. Sunlight stimulates the skin to make vitamin D, so be sure to get enough sunlight. If you live in a cold climate or you do not get outside often, your health care provider may recommend that you take vitamin D supplements. Good   sources of vitamin D in your diet include:  Egg yolks.  Saltwater fish.  Milk and cereal fortified with vitamin D. Follow these recommended amounts for daily vitamin D intake:  Children and teens, age 1-18: 600 international units.  Adults, age 50 or younger: 400-800 international units.  Adults, age 51 or older: 800-1,000 international units. Other Nutrients Other nutrients for bone  health include:  Phosphorus. This mineral is found in meat, poultry, dairy foods, nuts, and legumes. The recommended daily intake for adult men and adult women is 700 mg.  Magnesium. This mineral is found in seeds, nuts, dark green vegetables, and legumes. The recommended daily intake for adult men is 400-420 mg. For adult women, it is 310-320 mg.  Vitamin K. This vitamin is found in green leafy vegetables. The recommended daily intake is 120 mg for adult men and 90 mg for adult women. WHAT TYPE OF PHYSICAL ACTIVITY IS BEST FOR BUILDING AND MAINTAINING HEALTHY BONES? Weight-bearing and strength-building activities are important for building and maintaining peak bone mass. Weight-bearing activities cause muscles and bones to work against gravity. Strength-building activities increases muscle strength that supports bones. Weight-bearing and muscle-building activities include:  Walking and hiking.  Jogging and running.  Dancing.  Gym exercises.  Lifting weights.  Tennis and racquetball.  Climbing stairs.  Aerobics. Adults should get at least 30 minutes of moderate physical activity on most days. Children should get at least 60 minutes of moderate physical activity on most days. Ask your health care provide what type of exercise is best for you. WHERE CAN I FIND MORE INFORMATION? For more information, check out the following websites:  National Osteoporosis Foundation: http://nof.org/learn/basics  National Institutes of Health: http://www.niams.nih.gov/Health_Info/Bone/Bone_Health/bone_health_for_life.asp   This information is not intended to replace advice given to you by your health care provider. Make sure you discuss any questions you have with your health care provider.   Document Released: 06/29/2003 Document Revised: 08/23/2014 Document Reviewed: 04/13/2014 Elsevier Interactive Patient Education 2016 [employer] Inc.  

## 2015-04-13 NOTE — Addendum Note (Signed)
Addended by: Rolena Infante on: 04/13/2015 09:19 AM   Modules accepted: Orders

## 2015-04-13 NOTE — Progress Notes (Signed)
Subjective:    Patient ID: Teresa Holloway, female    DOB: July 27, 1940, 74 y.o.   MRN: [id number]  Patient here today for follow up of chronic medical problems. NO changes since last visit-   Hypertension This is a chronic problem. The current episode started more than 1 year ago. The problem is controlled. Associated symptoms include palpitations. Pertinent negatives include no chest pain, headaches, neck pain or shortness of breath. Risk factors for coronary artery disease include dyslipidemia and post-menopausal state. Past treatments include ACE inhibitors and diuretics. The current treatment provides moderate improvement. Compliance problems include diet and exercise.   Hyperlipidemia This is a chronic problem. The current episode started more than 1 year ago. The problem is uncontrolled. Recent lipid tests were reviewed and are high. She has no history of diabetes or hypothyroidism. Pertinent negatives include no chest pain or shortness of breath. Current antihyperlipidemic treatment includes statins. The current treatment provides moderate improvement of lipids. Compliance problems include adherence to diet and adherence to exercise.  Risk factors for coronary artery disease include dyslipidemia and hypertension.  CAD/MI Currently on Imdurr - no C/O chest pain or SOB or Dyspnea- Sees Dr. Dorena Cookey sees yearly and just had follow-up in April. Depression Paxil daily  Which is working well for her- no c/o side effects Vitamin D deficiency  Vitamin D OTC- No side effects Osteoarthritis bil knees and hips- takes hydrocodone BID and says that can't walk without it due to the pain. GERD Zantac working well to keep symptoms under control. Osteoporosis Patient had  Bone density test in March of this year. Currently on  No meds.   Review of Systems  Constitutional: Negative.   HENT: Negative.   Respiratory: Negative for shortness of breath.   Cardiovascular: Positive for palpitations.  Negative for chest pain.  Genitourinary: Positive for dysuria, urgency and frequency.  Musculoskeletal: Negative for neck pain.  Neurological: Negative for headaches.  Psychiatric/Behavioral: Negative.   All other systems reviewed and are negative.      Objective:   Physical Exam  Constitutional: She is oriented to person, place, and time. She appears well-developed and well-nourished.  HENT:  Nose: Nose normal.  Mouth/Throat: Oropharynx is clear and moist.  Eyes: EOM are normal.  Neck: Trachea normal, normal range of motion and full passive range of motion without pain. Neck supple. No JVD present. Carotid bruit is not present. No thyromegaly present.  Cardiovascular: Normal rate, regular rhythm, normal heart sounds and intact distal pulses.  Exam reveals no gallop and no friction rub.   No murmur heard. Pulmonary/Chest: Effort normal and breath sounds normal.  Abdominal: Soft. Bowel sounds are normal. She exhibits no distension and no mass. There is no tenderness.  Musculoskeletal: Normal range of motion.  Lymphadenopathy:    She has no cervical adenopathy.  Neurological: She is alert and oriented to person, place, and time. She has normal reflexes.  Skin: Skin is warm and dry.  Psychiatric: She has a normal mood and affect. Her behavior is normal. Judgment and thought content normal.   BP 125/67 mmHg  Pulse 55  Temp(Src) 98.5 F (36.9 C) (Oral)  Ht 5\' 4"  (1.626 m)  Wt 173 lb (78.472 kg)  BMI 29.68 kg/m2        Assessment & Plan:  1. Essential hypertension, benign Do not add salt o diet  2. Gastroesophageal reflux disease without esophagitis Avoid spicy foods Do not eat 2 hours prior to bedtime  3. Osteoporosis Weight bearing exercises as  can tolerate  4. Primary osteoarthritis of left hip Continue pain meds Discussed new pain medicine protocol that is suppose to start later in year  5. Hyperlipidemia with target LDL less than 100 Low fat diet  6.  Depression Stress management  7. BMI 29.0-29.9,adult Discussed diet and exercise for person with BMI >25 Will recheck weight in 3-6 months  8. History of MI (myocardial infarction) Keep follow up with cardiologist  9. Osteoarthritis of left hip, unspecified osteoarthritis type - HYDROcodone-acetaminophen (NORCO/VICODIN) 5-325 MG tablet; TAKE 1 TABLET BY MOUTH EVERY 6 HOURS AS NEEDED FOR PAIN  Dispense: 60 tablet; Refill: 0 - HYDROcodone-acetaminophen (NORCO/VICODIN) 5-325 MG tablet; Take 1 tablet by mouth every 6 (six) hours as needed for moderate pain.  Dispense: 30 tablet; Refill: 0 - HYDROcodone-acetaminophen (NORCO) 5-325 MG tablet; Take 1 tablet by mouth every 6 (six) hours as needed for moderate pain.  Dispense: 30 tablet; Refill: 0    Labs pending Health maintenance reviewed Diet and exercise encouraged Continue all meds Follow up  In 3 months    Mary-Margaret Daphine Deutscher, FNP

## 2015-04-14 LAB — CMP14+EGFR
ALK PHOS: 89 IU/L (ref 39–117)
ALT: 9 IU/L (ref 0–32)
AST: 13 IU/L (ref 0–40)
Albumin/Globulin Ratio: 1.9 (ref 1.1–2.5)
Albumin: 4 g/dL (ref 3.5–4.8)
BUN/Creatinine Ratio: 20 (ref 11–26)
BUN: 15 mg/dL (ref 8–27)
Bilirubin Total: 0.4 mg/dL (ref 0.0–1.2)
CO2: 26 mmol/L (ref 18–29)
CREATININE: 0.76 mg/dL (ref 0.57–1.00)
Calcium: 9.5 mg/dL (ref 8.7–10.3)
Chloride: 99 mmol/L (ref 96–106)
GFR calc Af Amer: 90 mL/min/{1.73_m2} (ref >59)
GFR calc non Af Amer: 78 mL/min/{1.73_m2} (ref >59)
GLUCOSE: 85 mg/dL (ref 65–99)
Globulin, Total: 2.1 g/dL (ref 1.5–4.5)
Potassium: 3.8 mmol/L (ref 3.5–5.2)
Sodium: 141 mmol/L (ref 134–144)
Total Protein: 6.1 g/dL (ref 6.0–8.5)

## 2015-04-14 LAB — LIPID PANEL
CHOLESTEROL TOTAL: 127 mg/dL (ref 100–199)
Chol/HDL Ratio: 2 ratio units (ref 0.0–4.4)
HDL: 64 mg/dL (ref >39)
LDL CALC: 44 mg/dL (ref 0–99)
TRIGLYCERIDES: 93 mg/dL (ref 0–149)
VLDL CHOLESTEROL CAL: 19 mg/dL (ref 5–40)

## 2015-05-19 ENCOUNTER — Other Ambulatory Visit: Payer: Self-pay | Admitting: Nurse Practitioner

## 2015-05-19 ENCOUNTER — Telehealth: Payer: Self-pay | Admitting: Nurse Practitioner

## 2015-05-19 DIAGNOSIS — M1612 Unilateral primary osteoarthritis, left hip: Secondary | ICD-10-CM

## 2015-05-19 MED ORDER — HYDROCODONE-ACETAMINOPHEN 5-325 MG PO TABS: 1.0000 | ORAL_TABLET | Freq: Four times a day (QID) | ORAL | Status: DC | PRN

## 2015-06-05 ENCOUNTER — Other Ambulatory Visit: Payer: Self-pay | Admitting: Nurse Practitioner

## 2015-06-29 ENCOUNTER — Other Ambulatory Visit: Payer: Self-pay | Admitting: Nurse Practitioner

## 2015-07-07 ENCOUNTER — Other Ambulatory Visit: Payer: Self-pay | Admitting: Nurse Practitioner

## 2015-07-11 DIAGNOSIS — H40013 Open angle with borderline findings, low risk, bilateral: Secondary | ICD-10-CM | POA: Diagnosis not present

## 2015-07-11 DIAGNOSIS — H2513 Age-related nuclear cataract, bilateral: Secondary | ICD-10-CM | POA: Diagnosis not present

## 2015-07-14 ENCOUNTER — Encounter: Payer: Self-pay | Admitting: Nurse Practitioner

## 2015-07-14 ENCOUNTER — Ambulatory Visit (INDEPENDENT_AMBULATORY_CARE_PROVIDER_SITE_OTHER): Payer: Medicare Other | Admitting: Nurse Practitioner

## 2015-07-14 VITALS — BP 113/68 | HR 53 | Temp 97.6°F | Ht 64.0 in | Wt 178.4 lb

## 2015-07-14 DIAGNOSIS — F329 Major depressive disorder, single episode, unspecified: Secondary | ICD-10-CM

## 2015-07-14 DIAGNOSIS — K219 Gastro-esophageal reflux disease without esophagitis: Secondary | ICD-10-CM | POA: Diagnosis not present

## 2015-07-14 DIAGNOSIS — M129 Arthropathy, unspecified: Secondary | ICD-10-CM | POA: Diagnosis not present

## 2015-07-14 DIAGNOSIS — M1990 Unspecified osteoarthritis, unspecified site: Secondary | ICD-10-CM

## 2015-07-14 DIAGNOSIS — I10 Essential (primary) hypertension: Secondary | ICD-10-CM

## 2015-07-14 DIAGNOSIS — M1612 Unilateral primary osteoarthritis, left hip: Secondary | ICD-10-CM

## 2015-07-14 DIAGNOSIS — M170 Bilateral primary osteoarthritis of knee: Secondary | ICD-10-CM

## 2015-07-14 DIAGNOSIS — Z6829 Body mass index (BMI) 29.0-29.9, adult: Secondary | ICD-10-CM

## 2015-07-14 DIAGNOSIS — I25111 Atherosclerotic heart disease of native coronary artery with angina pectoris with documented spasm: Secondary | ICD-10-CM | POA: Diagnosis not present

## 2015-07-14 DIAGNOSIS — E785 Hyperlipidemia, unspecified: Secondary | ICD-10-CM | POA: Diagnosis not present

## 2015-07-14 DIAGNOSIS — Z23 Encounter for immunization: Secondary | ICD-10-CM

## 2015-07-14 DIAGNOSIS — M1610 Unilateral primary osteoarthritis, unspecified hip: Secondary | ICD-10-CM

## 2015-07-14 DIAGNOSIS — F32A Depression, unspecified: Secondary | ICD-10-CM

## 2015-07-14 DIAGNOSIS — Z1212 Encounter for screening for malignant neoplasm of rectum: Secondary | ICD-10-CM | POA: Diagnosis not present

## 2015-07-14 MED ORDER — METOPROLOL SUCCINATE ER 100 MG PO TB24: 100.0000 mg | ORAL_TABLET | Freq: Every day | ORAL | Status: DC

## 2015-07-14 MED ORDER — HYDROCODONE-ACETAMINOPHEN 5-325 MG PO TABS: ORAL_TABLET | ORAL | Status: DC

## 2015-07-14 MED ORDER — HYDROCODONE-ACETAMINOPHEN 5-325 MG PO TABS: 1.0000 | ORAL_TABLET | Freq: Four times a day (QID) | ORAL | Status: DC | PRN

## 2015-07-14 MED ORDER — HYDROCHLOROTHIAZIDE 25 MG PO TABS: ORAL_TABLET | ORAL | Status: DC

## 2015-07-14 MED ORDER — BENAZEPRIL HCL 40 MG PO TABS: 40.0000 mg | ORAL_TABLET | Freq: Every day | ORAL | Status: DC

## 2015-07-14 NOTE — Patient Instructions (Signed)
Fall Prevention in the Home  Falls can cause injuries and can affect people from all age groups. There are many simple things that you can do to make your home safe and to help prevent falls. WHAT CAN I DO ON THE OUTSIDE OF MY HOME?  Regularly repair the edges of walkways and driveways and fix any cracks.  Remove high doorway thresholds.  Trim any shrubbery on the main path into your home.  Use bright outdoor lighting.  Clear walkways of debris and clutter, including tools and rocks.  Regularly check that handrails are securely fastened and in good repair. Both sides of any steps should have handrails.  Install guardrails along the edges of any raised decks or porches.  Have leaves, snow, and ice cleared regularly.  Use sand or salt on walkways during winter months.  In the garage, clean up any spills right away, including grease or oil spills. WHAT CAN I DO IN THE BATHROOM?  Use night lights.  Install grab bars by the toilet and in the tub and shower. Do not use towel bars as grab bars.  Use non-skid mats or decals on the floor of the tub or shower.  If you need to sit down while you are in the shower, use a plastic, non-slip stool..  Keep the floor dry. Immediately clean up any water that spills on the floor.  Remove soap buildup in the tub or shower on a regular basis.  Attach bath mats securely with double-sided non-slip rug tape.  Remove throw rugs and other tripping hazards from the floor. WHAT CAN I DO IN THE BEDROOM?  Use night lights.  Make sure that a bedside light is easy to reach.  Do not use oversized bedding that drapes onto the floor.  Have a firm chair that has side arms to use for getting dressed.  Remove throw rugs and other tripping hazards from the floor. WHAT CAN I DO IN THE KITCHEN?   Clean up any spills right away.  Avoid walking on wet floors.  Place frequently used items in easy-to-reach places.  If you need to reach for something  above you, use a sturdy step stool that has a grab bar.  Keep electrical cables out of the way.  Do not use floor polish or wax that makes floors slippery. If you have to use wax, make sure that it is non-skid floor wax.  Remove throw rugs and other tripping hazards from the floor. WHAT CAN I DO IN THE STAIRWAYS?  Do not leave any items on the stairs.  Make sure that there are handrails on both sides of the stairs. Fix handrails that are broken or loose. Make sure that handrails are as long as the stairways.  Check any carpeting to make sure that it is firmly attached to the stairs. Fix any carpet that is loose or worn.  Avoid having throw rugs at the top or bottom of stairways, or secure the rugs with carpet tape to prevent them from moving.  Make sure that you have a light switch at the top of the stairs and the bottom of the stairs. If you do not have them, have them installed. WHAT ARE SOME OTHER FALL PREVENTION TIPS?  Wear closed-toe shoes that fit well and support your feet. Wear shoes that have rubber soles or low heels.  When you use a stepladder, make sure that it is completely opened and that the sides are firmly locked. Have someone hold the ladder while you   are using it. Do not climb a closed stepladder.  Add color or contrast paint or tape to grab bars and handrails in your home. Place contrasting color strips on the first and last steps.  Use mobility aids as needed, such as canes, walkers, scooters, and crutches.  Turn on lights if it is dark. Replace any light bulbs that burn out.  Set up furniture so that there are clear paths. Keep the furniture in the same spot.  Fix any uneven floor surfaces.  Choose a carpet design that does not hide the edge of steps of a stairway.  Be aware of any and all pets.  Review your medicines with your healthcare provider. Some medicines can cause dizziness or changes in blood pressure, which increase your risk of falling. Talk  with your health care provider about other ways that you can decrease your risk of falls. This may include working with a physical therapist or trainer to improve your strength, balance, and endurance.   This information is not intended to replace advice given to you by your health care provider. Make sure you discuss any questions you have with your health care provider.   Document Released: 03/29/2002 Document Revised: 08/23/2014 Document Reviewed: 05/13/2014 Elsevier Interactive Patient Education 2016 Elsevier Inc.  

## 2015-07-14 NOTE — Progress Notes (Signed)
Subjective:    Patient ID: Teresa Holloway, female    DOB: 01/19/1941, 75 y.o.   MRN: [id number]  Patient here today for follow up of chronic medical problems. No changes since her last visit on 04/13/15.   Hypertension This is a chronic problem. The current episode started more than 1 year ago. The problem is controlled. Pertinent negatives include no chest pain, headaches, neck pain or shortness of breath. Risk factors for coronary artery disease include dyslipidemia and post-menopausal state. Past treatments include ACE inhibitors and diuretics. The current treatment provides moderate improvement. Compliance problems include diet and exercise.   Hyperlipidemia This is a chronic problem. The current episode started more than 1 year ago. The problem is uncontrolled. Recent lipid tests were reviewed and are high. She has no history of diabetes or hypothyroidism. Pertinent negatives include no chest pain or shortness of breath. Current antihyperlipidemic treatment includes statins. The current treatment provides moderate improvement of lipids. Compliance problems include adherence to diet and adherence to exercise.  Risk factors for coronary artery disease include dyslipidemia and hypertension.  CAD/MI Currently on Imdurr - no C/O chest pain or SOB or Dyspnea- Sees Dr. Dorena Cookey sees yearly and her next appointment is April next month. Depression Paxil daily  Which is working well for her- no c/o side effects Vitamin D deficiency  Vitamin D OTC- No side effects Osteoarthritis bil knees and hips- takes hydrocodone BID and says she can't walk without it due to the pain, she is considering surgery as a remedy GERD Zantac working well to keep symptoms under control. Osteoporosis Patient had  Bone density test in March of this year. Currently on  No meds.   Review of Systems  Constitutional: Negative.   HENT: Negative.   Respiratory: Negative for shortness of breath.   Cardiovascular: Negative  for chest pain.  Genitourinary: Positive for urgency. Negative for dysuria and frequency.  Musculoskeletal: Negative for neck pain.  Neurological: Negative for headaches.  Psychiatric/Behavioral: Negative.   All other systems reviewed and are negative.      Objective:   Physical Exam  Constitutional: She is oriented to person, place, and time. She appears well-developed and well-nourished.  HENT:  Nose: Nose normal.  Mouth/Throat: Oropharynx is clear and moist.  Eyes: EOM are normal.  Neck: Trachea normal, normal range of motion and full passive range of motion without pain. Neck supple. No JVD present. Carotid bruit is not present. No thyromegaly present.  Cardiovascular: Normal rate, regular rhythm, normal heart sounds and intact distal pulses.  Exam reveals no gallop and no friction rub.   No murmur heard. Pulmonary/Chest: Effort normal and breath sounds normal.  Abdominal: Soft. Bowel sounds are normal. She exhibits no distension and no mass. There is no tenderness.  Musculoskeletal: Normal range of motion.  Lymphadenopathy:    She has no cervical adenopathy.  Neurological: She is alert and oriented to person, place, and time. She has normal reflexes.  Skin: Skin is warm and dry.  Psychiatric: She has a normal mood and affect. Her behavior is normal. Judgment and thought content normal.   BP 113/68 mmHg  Pulse 53  Temp(Src) 97.6 F (36.4 C) (Oral)  Ht 5\' 4"  (1.626 m)  Wt 178 lb 6.4 oz (80.922 kg)  BMI 30.61 kg/m2        Assessment & Plan:  1. Screening for malignant neoplasm of the rectum  2. Essential hypertension, benign Do not add salt to diet - benazepril (LOTENSIN) 40 MG tablet; Take  1 tablet (40 mg total) by mouth daily.  Dispense: 90 tablet; Refill: 1 - hydrochlorothiazide (HYDRODIURIL) 25 MG tablet; TAKE 1 TABLET (25 MG TOTAL) BY MOUTH DAILY.  Dispense: 90 tablet; Refill: 1 - CMP14+EGFR  3. Coronary artery disease involving native coronary artery of native  heart with angina pectoris with documented spasm (HCC) Keep app.t with cardiologist - metoprolol succinate (TOPROL-XL) 100 MG 24 hr tablet; Take 1 tablet (100 mg total) by mouth daily.  Dispense: 90 tablet; Refill: 1  4. Gastroesophageal reflux disease without esophagitis Avoid spicy foods  5. Arthritis of hip Will let us know when she is ready to see ortho  6. Arthritis of both knees  7. Primary osteoarthritis of left hip HYDROcodone-acetaminophen (NORCO/VICODIN) 5-325 MG tablet; TAKE 1 TABLET BY MOUTH EVERY 6 HOURS AS NEEDED FOR PAIN Dispense: 60 tablet; Refill: 0 - HYDROcodone-acetaminophen (NORCO/VICODIN) 5-325 MG tablet; Take 1 tablet by mouth every 6 (six) hours as needed for moderate pain. Dispense: 30 tablet; Refill: 0 - HYDROcodone-acetaminophen (NORCO) 5-325 MG tablet; Take 1 tablet by mouth every 6 (six) hours as needed for moderate pain. Dispense: 30 tablet; Refill: 0   8. Hyperlipidemia with target LDL less than 100 Low fat diet - Lipid panel  9. Depression Stress management  10. BMI 29.0-29.9,adult Discussed diet and exercise for person with BMI >25 Will recheck weight in 3-6 months  11. Osteoarthritis of left hip, unspecified osteoarthritis type Patient declined referral, will let us know when she want the referal - HYDROcodone-acetaminophen (NORCO/VICODIN) 5-325 MG tablet; Take 1 tablet by mouth every 6 (six) hours as needed for moderate pain.  Dispense: 60 tablet; Refill: 0 - HYDROcodone-acetaminophen (NORCO) 5-325 MG tablet; Take 1 tablet by mouth every 6 (six) hours as needed for moderate pain.  Dispense: 60 tablet; Refill: 0 - HYDROcodone-acetaminophen (NORCO/VICODIN) 5-325 MG tablet; TAKE 1 TABLET BY MOUTH EVERY 6 HOURS AS NEEDED FOR PAIN  Dispense: 60 tablet; Refill: 0   Stool card result pending Labs pending Health maintenance reviewed Diet and exercise encouraged Continue all meds Follow up  In 3 months   Mary-Margaret Daphine Deutscher, FNP

## 2015-07-15 LAB — CMP14+EGFR
A/G RATIO: 1.9 (ref 1.2–2.2)
ALT: 13 IU/L (ref 0–32)
AST: 10 IU/L (ref 0–40)
Albumin: 4.2 g/dL (ref 3.5–4.8)
Alkaline Phosphatase: 88 IU/L (ref 39–117)
BUN/Creatinine Ratio: 18 (ref 11–26)
BUN: 12 mg/dL (ref 8–27)
Bilirubin Total: 0.5 mg/dL (ref 0.0–1.2)
CALCIUM: 9.5 mg/dL (ref 8.7–10.3)
CO2: 28 mmol/L (ref 18–29)
CREATININE: 0.66 mg/dL (ref 0.57–1.00)
Chloride: 99 mmol/L (ref 96–106)
GFR calc Af Amer: 101 mL/min/{1.73_m2} (ref >59)
GFR, EST NON AFRICAN AMERICAN: 88 mL/min/{1.73_m2} (ref >59)
GLUCOSE: 90 mg/dL (ref 65–99)
Globulin, Total: 2.2 g/dL (ref 1.5–4.5)
POTASSIUM: 4 mmol/L (ref 3.5–5.2)
Sodium: 141 mmol/L (ref 134–144)
Total Protein: 6.4 g/dL (ref 6.0–8.5)

## 2015-07-15 LAB — LIPID PANEL
CHOL/HDL RATIO: 2.1 ratio (ref 0.0–4.4)
Cholesterol, Total: 131 mg/dL (ref 100–199)
HDL: 61 mg/dL (ref >39)
LDL Calculated: 48 mg/dL (ref 0–99)
TRIGLYCERIDES: 111 mg/dL (ref 0–149)
VLDL Cholesterol Cal: 22 mg/dL (ref 5–40)

## 2015-07-16 LAB — FECAL OCCULT BLOOD, IMMUNOCHEMICAL: FECAL OCCULT BLD: NEGATIVE

## 2015-07-20 ENCOUNTER — Telehealth: Payer: Self-pay | Admitting: Nurse Practitioner

## 2015-07-20 DIAGNOSIS — M25559 Pain in unspecified hip: Secondary | ICD-10-CM

## 2015-07-20 NOTE — Telephone Encounter (Signed)
Referral made 

## 2015-08-18 ENCOUNTER — Telehealth: Payer: Self-pay | Admitting: Nurse Practitioner

## 2015-08-18 NOTE — Telephone Encounter (Signed)
Please address

## 2015-08-21 ENCOUNTER — Other Ambulatory Visit: Payer: Self-pay | Admitting: *Deleted

## 2015-08-21 DIAGNOSIS — M1612 Unilateral primary osteoarthritis, left hip: Secondary | ICD-10-CM

## 2015-08-21 DIAGNOSIS — M170 Bilateral primary osteoarthritis of knee: Secondary | ICD-10-CM

## 2015-08-21 NOTE — Telephone Encounter (Signed)
For what?

## 2015-08-23 NOTE — Telephone Encounter (Signed)
Patient says she already has an appt with ortho but changed providers to see the one   her sister has seen.

## 2015-08-31 DIAGNOSIS — M7061 Trochanteric bursitis, right hip: Secondary | ICD-10-CM | POA: Diagnosis not present

## 2015-08-31 DIAGNOSIS — M1990 Unspecified osteoarthritis, unspecified site: Secondary | ICD-10-CM | POA: Diagnosis not present

## 2015-09-06 DIAGNOSIS — Z7982 Long term (current) use of aspirin: Secondary | ICD-10-CM | POA: Diagnosis not present

## 2015-09-06 DIAGNOSIS — Z95818 Presence of other cardiac implants and grafts: Secondary | ICD-10-CM | POA: Diagnosis not present

## 2015-09-06 DIAGNOSIS — I252 Old myocardial infarction: Secondary | ICD-10-CM | POA: Diagnosis not present

## 2015-09-06 DIAGNOSIS — I119 Hypertensive heart disease without heart failure: Secondary | ICD-10-CM | POA: Diagnosis not present

## 2015-09-06 DIAGNOSIS — Z79899 Other long term (current) drug therapy: Secondary | ICD-10-CM | POA: Diagnosis not present

## 2015-09-06 DIAGNOSIS — I200 Unstable angina: Secondary | ICD-10-CM | POA: Diagnosis not present

## 2015-09-06 DIAGNOSIS — R001 Bradycardia, unspecified: Secondary | ICD-10-CM | POA: Diagnosis not present

## 2015-09-06 DIAGNOSIS — I2510 Atherosclerotic heart disease of native coronary artery without angina pectoris: Secondary | ICD-10-CM | POA: Diagnosis not present

## 2015-09-11 ENCOUNTER — Other Ambulatory Visit: Payer: Self-pay | Admitting: Nurse Practitioner

## 2015-09-12 NOTE — Telephone Encounter (Signed)
According to chart has one more refill to be filled on 09/09/15- was given 3 rx last tinme she was seen

## 2015-09-20 NOTE — Telephone Encounter (Signed)
Aware,  she has a refill on pain medication.

## 2015-09-25 ENCOUNTER — Telehealth: Payer: Self-pay | Admitting: Nurse Practitioner

## 2015-09-25 DIAGNOSIS — M170 Bilateral primary osteoarthritis of knee: Secondary | ICD-10-CM

## 2015-09-25 DIAGNOSIS — M1612 Unilateral primary osteoarthritis, left hip: Secondary | ICD-10-CM

## 2015-09-25 NOTE — Telephone Encounter (Signed)
Patient aware that referral has been made.  

## 2015-09-28 DIAGNOSIS — M1611 Unilateral primary osteoarthritis, right hip: Secondary | ICD-10-CM | POA: Diagnosis not present

## 2015-10-02 ENCOUNTER — Other Ambulatory Visit: Payer: Self-pay | Admitting: Nurse Practitioner

## 2015-10-10 ENCOUNTER — Other Ambulatory Visit: Payer: Self-pay | Admitting: Nurse Practitioner

## 2015-10-10 DIAGNOSIS — M1612 Unilateral primary osteoarthritis, left hip: Secondary | ICD-10-CM

## 2015-10-10 MED ORDER — HYDROCODONE-ACETAMINOPHEN 5-325 MG PO TABS: 1.0000 | ORAL_TABLET | Freq: Four times a day (QID) | ORAL | Status: DC | PRN

## 2015-10-10 NOTE — Telephone Encounter (Signed)
Pt notified to pick up rx -left up front

## 2015-10-10 NOTE — Telephone Encounter (Signed)
Last filled 09/11/15, last seen 07/14/15

## 2015-10-10 NOTE — Telephone Encounter (Signed)
RX ready for pick up 

## 2015-10-12 DIAGNOSIS — M25551 Pain in right hip: Secondary | ICD-10-CM | POA: Diagnosis not present

## 2015-10-16 ENCOUNTER — Other Ambulatory Visit: Payer: Self-pay | Admitting: Nurse Practitioner

## 2015-10-30 ENCOUNTER — Encounter: Payer: Self-pay | Admitting: Nurse Practitioner

## 2015-10-30 ENCOUNTER — Ambulatory Visit (INDEPENDENT_AMBULATORY_CARE_PROVIDER_SITE_OTHER): Payer: Medicare Other | Admitting: Nurse Practitioner

## 2015-10-30 VITALS — BP 120/71 | HR 76 | Temp 97.2°F | Ht 64.0 in | Wt 178.0 lb

## 2015-10-30 DIAGNOSIS — I25111 Atherosclerotic heart disease of native coronary artery with angina pectoris with documented spasm: Secondary | ICD-10-CM | POA: Diagnosis not present

## 2015-10-30 DIAGNOSIS — Z6829 Body mass index (BMI) 29.0-29.9, adult: Secondary | ICD-10-CM | POA: Diagnosis not present

## 2015-10-30 DIAGNOSIS — I252 Old myocardial infarction: Secondary | ICD-10-CM | POA: Diagnosis not present

## 2015-10-30 DIAGNOSIS — F32A Depression, unspecified: Secondary | ICD-10-CM

## 2015-10-30 DIAGNOSIS — E785 Hyperlipidemia, unspecified: Secondary | ICD-10-CM

## 2015-10-30 DIAGNOSIS — K219 Gastro-esophageal reflux disease without esophagitis: Secondary | ICD-10-CM | POA: Diagnosis not present

## 2015-10-30 DIAGNOSIS — M810 Age-related osteoporosis without current pathological fracture: Secondary | ICD-10-CM

## 2015-10-30 DIAGNOSIS — F329 Major depressive disorder, single episode, unspecified: Secondary | ICD-10-CM

## 2015-10-30 DIAGNOSIS — I10 Essential (primary) hypertension: Secondary | ICD-10-CM | POA: Diagnosis not present

## 2015-10-30 DIAGNOSIS — M1612 Unilateral primary osteoarthritis, left hip: Secondary | ICD-10-CM | POA: Diagnosis not present

## 2015-10-30 LAB — CMP14+EGFR
A/G RATIO: 1.7 (ref 1.2–2.2)
ALBUMIN: 4.1 g/dL (ref 3.5–4.8)
ALT: 10 IU/L (ref 0–32)
AST: 14 IU/L (ref 0–40)
Alkaline Phosphatase: 87 IU/L (ref 39–117)
BILIRUBIN TOTAL: 0.5 mg/dL (ref 0.0–1.2)
BUN / CREAT RATIO: 16 (ref 12–28)
BUN: 13 mg/dL (ref 8–27)
CALCIUM: 9.7 mg/dL (ref 8.7–10.3)
CO2: 27 mmol/L (ref 18–29)
Chloride: 99 mmol/L (ref 96–106)
Creatinine, Ser: 0.81 mg/dL (ref 0.57–1.00)
GFR, EST AFRICAN AMERICAN: 83 mL/min/{1.73_m2} (ref >59)
GFR, EST NON AFRICAN AMERICAN: 72 mL/min/{1.73_m2} (ref >59)
GLOBULIN, TOTAL: 2.4 g/dL (ref 1.5–4.5)
Glucose: 99 mg/dL (ref 65–99)
POTASSIUM: 3.9 mmol/L (ref 3.5–5.2)
Sodium: 141 mmol/L (ref 134–144)
TOTAL PROTEIN: 6.5 g/dL (ref 6.0–8.5)

## 2015-10-30 LAB — LIPID PANEL
CHOL/HDL RATIO: 1.9 ratio (ref 0.0–4.4)
Cholesterol, Total: 131 mg/dL (ref 100–199)
HDL: 69 mg/dL (ref >39)
LDL Calculated: 40 mg/dL (ref 0–99)
Triglycerides: 110 mg/dL (ref 0–149)
VLDL CHOLESTEROL CAL: 22 mg/dL (ref 5–40)

## 2015-10-30 MED ORDER — METOPROLOL SUCCINATE ER 100 MG PO TB24: 100.0000 mg | ORAL_TABLET | Freq: Every day | ORAL | Status: DC

## 2015-10-30 MED ORDER — CLOPIDOGREL BISULFATE 75 MG PO TABS: 75.0000 mg | ORAL_TABLET | Freq: Every day | ORAL | Status: DC

## 2015-10-30 MED ORDER — PAROXETINE HCL 40 MG PO TABS: 40.0000 mg | ORAL_TABLET | Freq: Every day | ORAL | Status: DC

## 2015-10-30 MED ORDER — HYDROCODONE-ACETAMINOPHEN 5-325 MG PO TABS: ORAL_TABLET | ORAL | Status: DC

## 2015-10-30 MED ORDER — HYDROCODONE-ACETAMINOPHEN 5-325 MG PO TABS: 1.0000 | ORAL_TABLET | Freq: Four times a day (QID) | ORAL | Status: DC | PRN

## 2015-10-30 MED ORDER — BENAZEPRIL HCL 40 MG PO TABS: 40.0000 mg | ORAL_TABLET | Freq: Every day | ORAL | Status: DC

## 2015-10-30 MED ORDER — ATORVASTATIN CALCIUM 40 MG PO TABS: 40.0000 mg | ORAL_TABLET | Freq: Every day | ORAL | Status: DC

## 2015-10-30 MED ORDER — HYDROCHLOROTHIAZIDE 25 MG PO TABS: ORAL_TABLET | ORAL | Status: DC

## 2015-10-30 MED ORDER — AMLODIPINE BESYLATE 10 MG PO TABS: 10.0000 mg | ORAL_TABLET | Freq: Every day | ORAL | Status: DC

## 2015-10-30 NOTE — Progress Notes (Signed)
Subjective:    Patient ID: Teresa Holloway, female    DOB: Oct 18, 1940, 75 y.o.   MRN: [id number]  Patient here today for follow up of chronic medical problems.    Hypertension This is a chronic problem. The current episode started more than 1 year ago. The problem is controlled. Pertinent negatives include no chest pain, headaches, neck pain or shortness of breath. Risk factors for coronary artery disease include dyslipidemia and post-menopausal state. Past treatments include ACE inhibitors and diuretics. The current treatment provides moderate improvement. Compliance problems include diet and exercise.   Hyperlipidemia This is a chronic problem. The current episode started more than 1 year ago. The problem is uncontrolled. Recent lipid tests were reviewed and are high. She has no history of diabetes or hypothyroidism. Pertinent negatives include no chest pain or shortness of breath. Current antihyperlipidemic treatment includes statins. The current treatment provides moderate improvement of lipids. Compliance problems include adherence to diet and adherence to exercise.  Risk factors for coronary artery disease include dyslipidemia and hypertension.  CAD/MI Currently on Imdurr - no C/O chest pain or SOB or Dyspnea- Sees Teresa Holloway sees yearly and her next appointment is April next month. Depression Paxil daily  Which is working well for her- no c/o side effects Vitamin D deficiency  Vitamin D OTC- No side effects Osteoarthritis bil knees and hips- takes hydrocodone BID and says she can't walk without it due to the pain, she is considering surgery as a remedy. Takes norco daily for pain.Ortho is setting her up for physical therapy Pain assessment: Cause of pain-  Osteo arthritis Pain location- knees and hips Pain on scale of 1-10-  7-8/10 Frequency- daily What increases pain-walking and standing What makes pain Better-resting Effects on ADL - just takes her longer to do ADL's then use  to Any change in general medical condition-none  Current medications- norco Effectiveness of current meds-drops pain to 3-4/10 Adverse reactions form pain meds- none  Pill count performed-No Urine drug screen- No GERD Zantac working well to keep symptoms under control. Osteoporosis Patient had  Bone density test in March of this year. Currently on  No meds.   Review of Systems  Constitutional: Negative.   HENT: Negative.   Respiratory: Negative for shortness of breath.   Cardiovascular: Negative for chest pain.  Genitourinary: Positive for urgency. Negative for dysuria and frequency.  Musculoskeletal: Negative for neck pain.  Neurological: Negative for headaches.  Psychiatric/Behavioral: Negative.   All other systems reviewed and are negative.      Objective:   Physical Exam  Constitutional: She is oriented to person, place, and time. She appears well-developed and well-nourished.  HENT:  Nose: Nose normal.  Mouth/Throat: Oropharynx is clear and moist.  Eyes: EOM are normal.  Neck: Trachea normal, normal range of motion and full passive range of motion without pain. Neck supple. No JVD present. Carotid bruit is not present. No thyromegaly present.  Cardiovascular: Normal rate, regular rhythm, normal heart sounds and intact distal pulses.  Exam reveals no gallop and no friction rub.   No murmur heard. Pulmonary/Chest: Effort normal and breath sounds normal.  Abdominal: Soft. Bowel sounds are normal. She exhibits no distension and no mass. There is no tenderness.  Musculoskeletal: Normal range of motion.  Lymphadenopathy:    She has no cervical adenopathy.  Neurological: She is alert and oriented to person, place, and time. She has normal reflexes.  Skin: Skin is warm and dry.  Psychiatric: She has a normal mood  and affect. Her behavior is normal. Judgment and thought content normal.   BP 120/71 mmHg  Pulse 76  Temp(Src) 97.2 F (36.2 C) (Oral)  Ht 5\' 4"  (1.626 m)   Wt 178 lb (80.74 kg)  BMI 30.54 kg/m2        Assessment & Plan:  1. Essential hypertension, benign Do not add salt to deit - benazepril (LOTENSIN) 40 MG tablet; Take 1 tablet (40 mg total) by mouth daily.  Dispense: 90 tablet; Refill: 1 - hydrochlorothiazide (HYDRODIURIL) 25 MG tablet; TAKE 1 TABLET (25 MG TOTAL) BY MOUTH DAILY.  Dispense: 90 tablet; Refill: 1 - amLODipine (NORVASC) 10 MG tablet; Take 1 tablet (10 mg total) by mouth daily.  Dispense: 90 tablet; Refill: 1 - CMP14+EGFR  2. Coronary artery disease involving native coronary artery of native heart with angina pectoris with documented spasm (HCC) Keep follow up with cardiologist - metoprolol succinate (TOPROL-XL) 100 MG 24 hr tablet; Take 1 tablet (100 mg total) by mouth daily.  Dispense: 90 tablet; Refill: 1 - clopidogrel (PLAVIX) 75 MG tablet; Take 1 tablet (75 mg total) by mouth daily.  Dispense: 90 tablet; Refill: 1  3. Gastroesophageal reflux disease without esophagitis Avoid spicy foods Do not eat 2 hours prior to bedtime  4. Osteoporosis Weight bearing exercises if can tolerate  5. Primary osteoarthritis of left hip Keep follow up with ortho Patient will  Go by PT in [place] to see if can get scheduled to start  6. Hyperlipidemia with target LDL less than 100 Low fat diet - atorvastatin (LIPITOR) 40 MG tablet; Take 1 tablet (40 mg total) by mouth daily at 6 PM.  Dispense: 90 tablet; Refill: 1 - Lipid panel  7. History of MI (myocardial infarction)  8. Depression Stress management - PARoxetine (PAXIL) 40 MG tablet; Take 1 tablet (40 mg total) by mouth daily.  Dispense: 90 tablet; Refill: 1  9. BMI 29.0-29.9,adult Discussed diet and exercise for person with BMI >25 Will recheck weight in 3-6 months  10. Osteoarthritis of left hip, unspecified osteoarthritis type - HYDROcodone-acetaminophen (NORCO/VICODIN) 5-325 MG tablet; Take 1 tablet by mouth every 6 (six) hours as needed for moderate pain.  Dispense:  60 tablet; Refill: 0 - HYDROcodone-acetaminophen (NORCO) 5-325 MG tablet; Take 1 tablet by mouth every 6 (six) hours as needed for moderate pain.  Dispense: 60 tablet; Refill: 0 - HYDROcodone-acetaminophen (NORCO/VICODIN) 5-325 MG tablet; TAKE 1 TABLET BY MOUTH EVERY 6 HOURS AS NEEDED FOR PAIN  Dispense: 60 tablet; Refill: 0    Labs pending Health maintenance reviewed Diet and exercise encouraged Continue all meds Follow up  In 3 months   Mary-Margaret Daphine Deutscher, FNP

## 2015-11-04 ENCOUNTER — Other Ambulatory Visit: Payer: Self-pay | Admitting: Nurse Practitioner

## 2015-11-06 ENCOUNTER — Ambulatory Visit: Payer: Medicare Other | Attending: Orthopedic Surgery | Admitting: Physical Therapy

## 2015-11-06 DIAGNOSIS — M25551 Pain in right hip: Secondary | ICD-10-CM | POA: Diagnosis not present

## 2015-11-06 DIAGNOSIS — M6281 Muscle weakness (generalized): Secondary | ICD-10-CM

## 2015-11-06 DIAGNOSIS — R2689 Other abnormalities of gait and mobility: Secondary | ICD-10-CM | POA: Diagnosis not present

## 2015-11-06 NOTE — Therapy (Signed)
[facility] [address] [place], [place], [address] Phone: [phone]   Fax:  [phone]  Physical Therapy Evaluation  Patient Details  Name: Teresa Holloway MRN: [id number] Date of Birth: [birth year] Referring Provider: Gean Birchwood MD.  Encounter Date: 11/06/2015      PT End of Session - 11/06/15 1435    Visit Number 1   Number of Visits 16   Date for PT Re-Evaluation 01/05/16   PT Start Time 0145   PT Stop Time 0233   PT Time Calculation (min) 48 min   Activity Tolerance Patient limited by pain   Behavior During Therapy [facility] for tasks assessed/performed      Past Medical History  Diagnosis Date  . CAD (coronary artery disease)   . MI (myocardial infarction) (HCC)   . Hypertension   . Arthritis   . Osteopenia   . Hyperlipidemia   . GERD (gastroesophageal reflux disease)   . Chronic back pain     Past Surgical History  Procedure Laterality Date  . Left wrist fracture    . Left breast biospy    . Ectopic pregnancy surgery      There were no vitals filed for this visit.       Subjective Assessment - 11/06/15 1437    Subjective This hip pain has been going on her 3-4 years.  I can't walk good because of pain.   Limitations Walking   How long can you walk comfortably? Short distances.   Patient Stated Goals Get out of pain.   Currently in Pain? Yes   Pain Score 8             [facility] PT Assessment - 11/06/15 0001    Assessment   Medical Diagnosis Right hip bursitis.   Referring Provider Gean Birchwood MD.   Onset Date/Surgical Date --  3 to 4 years.   Precautions   Precautions None   Restrictions   Weight Bearing Restrictions No   Balance Screen   Has the patient fallen in the past 6 months Yes   How many times? --  1   Has the patient had a decrease in activity level because of a fear of falling?  No   Is the patient reluctant to leave their home because of a fear of falling?  No   Home Environment    Living Environment Private residence   Prior Function   Level of Independence Independent   Posture/Postural Control   Posture Comments Left knee malalignment.   ROM / Strength   AROM / PROM / Strength AROM;Strength   AROM   Overall AROM Comments Normal right LE ROM.   Strength   Overall Strength Comments Right hip abduction= 3-/5.   Palpation   Palpation comment Tender diffusely around the patient's right greater trochanter.   Ambulation/Gait   Gait Comments Trendelenburg gait pattern.                   [facility] Adult PT Treatment/Exercise - 11/06/15 0001    Modalities   Modalities Electrical Stimulation   Electrical Stimulation   Electrical Stimulation Location Right hip.   Electrical Stimulation Action IFC   Electrical Stimulation Parameters 80-150 Hz at 100% scan x 20 minutes.   Electrical Stimulation Goals Pain                  PT Short Term Goals - 11/06/15 1510    PT SHORT TERM GOAL #1   Title  Ind with a HEP.   Time 2   Period Weeks   Status New           PT Long Term Goals - 11-22-2015 1510    PT LONG TERM GOAL #1   Title Perform ADL's with pain not > 3-4/10.   Time 8   Period Weeks   Status New   PT LONG TERM GOAL #2   Title Right hip strength to 4+/5 to help normalize gait pattern.   Time 8   Period Weeks   Status New   PT LONG TERM GOAL #3   Title Average daily right hip pain-level not to exceed 4/10.   Time 8   Period Weeks   Status New               Plan - November 22, 2015 1450    Clinical Impression Statement The patient has had ongoing right hip pain over the last 3 to 4 years.  She had injections in the past but recent injections have not proven helpful.  Her pain-level today is a 7-8/10.  She is unable to sleep on her right side due to pain.  Her right hip is weak into abduction producing a Trendelenburg gait pattern.   Rehab Potential Good   PT Frequency 2x / week   PT Duration 8 weeks   PT Treatment/Interventions  ADLs/Self Care Home Management;Cryotherapy;Electrical Stimulation;Moist Heat;Therapeutic exercise;Therapeutic activities;Ultrasound;Patient/family education;Manual techniques   PT Next Visit Plan IFC to right hip; then perform (left sdly position with pillows between knees) Combo e'stim/U/S and STW/M to right greater trochanteris region.  Right hip abduction strengthening.   Consulted and Agree with Plan of Care Patient      Patient will benefit from skilled therapeutic intervention in order to improve the following deficits and impairments:  Pain, Decreased activity tolerance, Decreased strength, Abnormal gait  Visit Diagnosis: Pain in right hip - Plan: PT plan of care cert/re-cert  Muscle weakness (generalized) - Plan: PT plan of care cert/re-cert  Other abnormalities of gait and mobility - Plan: PT plan of care cert/re-cert      G-Codes - November 22, 2015 1435    Functional Assessment Tool Used FOTO...57% limitation.   Functional Limitation Mobility: Walking and moving around   Mobility: Walking and Moving Around Current Status [phone]) At least 40 percent but less than 60 percent impaired, limited or restricted   Mobility: Walking and Moving Around Goal Status [phone]) At least 20 percent but less than 40 percent impaired, limited or restricted       Problem List Patient Active Problem List   Diagnosis Date Noted  . BMI 29.0-29.9,adult 01/02/2015  . Osteoarthritis of left hip 08/23/2013  . Depression 08/23/2013  . Osteoporosis 06/23/2013  . Glaucoma of right eye 06/23/2013  . CAD (coronary artery disease) 08/19/2012  . History of MI (myocardial infarction) 08/19/2012  . Arthritis of both knees 08/19/2012  . Essential hypertension, benign 08/10/2012  . Hyperlipidemia with target LDL less than 100 08/10/2012  . GERD (gastroesophageal reflux disease) 08/10/2012    Humbert Morozov, [place] MPT 11-22-15, 3:14 PM  [facility] [address] [place], [place], [address] Phone: [phone]   Fax:  [phone]  Name: Teresa Holloway MRN: [id number] Date of Birth: [birth year]

## 2015-11-09 ENCOUNTER — Ambulatory Visit: Payer: Medicare Other | Admitting: Physical Therapy

## 2015-11-09 DIAGNOSIS — M25551 Pain in right hip: Secondary | ICD-10-CM

## 2015-11-09 DIAGNOSIS — M6281 Muscle weakness (generalized): Secondary | ICD-10-CM | POA: Diagnosis not present

## 2015-11-09 DIAGNOSIS — R2689 Other abnormalities of gait and mobility: Secondary | ICD-10-CM | POA: Diagnosis not present

## 2015-11-09 NOTE — Therapy (Signed)
[facility] [address] [place], [place], [address] Phone: [phone]   Fax:  [phone]  Physical Therapy Treatment  Patient Details  Name: Teresa Holloway MRN: [id number] Date of Birth: 1940-08-14 Referring Provider: Gean Birchwood MD.  Encounter Date: 11/09/2015      PT End of Session - 11/09/15 1832    Visit Number 2   Number of Visits 16   Date for PT Re-Evaluation 01/05/16   PT Start Time 0315   PT Stop Time 0404   PT Time Calculation (min) 49 min      Past Medical History  Diagnosis Date  . CAD (coronary artery disease)   . MI (myocardial infarction) (HCC)   . Hypertension   . Arthritis   . Osteopenia   . Hyperlipidemia   . GERD (gastroesophageal reflux disease)   . Chronic back pain     Past Surgical History  Procedure Laterality Date  . Left wrist fracture    . Left breast biospy    . Ectopic pregnancy surgery      There were no vitals filed for this visit.      Subjective Assessment - 11/09/15 1830    Subjective That treatment helped my hip a lot.   Limitations Walking   How long can you walk comfortably? Short distances.   Patient Stated Goals Get out of pain.   Currently in Pain? Yes   Pain Score 5    Pain Location Hip   Pain Orientation Left   Pain Descriptors / Indicators Aching   Pain Type Chronic pain   Pain Onset More than a month ago   Pain Frequency Constant     Treatment:  Combo E'stim/U/S at 1.50 W/CM2 x 12 minutes to affected right hip area f/b STW/M x 12 minutes (there was an area of her right TFL that was very tender) f/b HMP and IFC x 20 minutes.  Patient tolerated treatment well.                              PT Short Term Goals - 11/06/15 1510    PT SHORT TERM GOAL #1   Title Ind with a HEP.   Time 2   Period Weeks   Status New           PT Long Term Goals - 11/06/15 1510    PT LONG TERM GOAL #1   Title Perform ADL's with pain not > 3-4/10.    Time 8   Period Weeks   Status New   PT LONG TERM GOAL #2   Title Right hip strength to 4+/5 to help normalize gait pattern.   Time 8   Period Weeks   Status New   PT LONG TERM GOAL #3   Title Average daily right hip pain-level not to exceed 4/10.   Time 8   Period Weeks   Status New             Patient will benefit from skilled therapeutic intervention in order to improve the following deficits and impairments:     Visit Diagnosis: Pain in right hip  Muscle weakness (generalized)  Other abnormalities of gait and mobility     Problem List Patient Active Problem List   Diagnosis Date Noted  . BMI 29.0-29.9,adult 01/02/2015  . Osteoarthritis of left hip 08/23/2013  . Depression 08/23/2013  . Osteoporosis 06/23/2013  . Glaucoma of right eye 06/23/2013  .  CAD (coronary artery disease) 08/19/2012  . History of MI (myocardial infarction) 08/19/2012  . Arthritis of both knees 08/19/2012  . Essential hypertension, benign 08/10/2012  . Hyperlipidemia with target LDL less than 100 08/10/2012  . GERD (gastroesophageal reflux disease) 08/10/2012    [place], [place] MPT 11/09/2015, 6:41 PM  [facility] [address] [place], [place], [address] Phone: [phone]   Fax:  [phone]  Name: JOHANA FEUSTEL MRN: [id number] Date of Birth: Dec 16, 1940

## 2015-11-14 DIAGNOSIS — M1712 Unilateral primary osteoarthritis, left knee: Secondary | ICD-10-CM | POA: Diagnosis not present

## 2015-11-14 DIAGNOSIS — M7061 Trochanteric bursitis, right hip: Secondary | ICD-10-CM | POA: Diagnosis not present

## 2015-11-15 ENCOUNTER — Encounter: Payer: Self-pay | Admitting: Physical Therapy

## 2015-11-15 ENCOUNTER — Ambulatory Visit: Payer: Medicare Other | Admitting: Physical Therapy

## 2015-11-15 DIAGNOSIS — M25551 Pain in right hip: Secondary | ICD-10-CM

## 2015-11-15 DIAGNOSIS — M6281 Muscle weakness (generalized): Secondary | ICD-10-CM | POA: Diagnosis not present

## 2015-11-15 DIAGNOSIS — R2689 Other abnormalities of gait and mobility: Secondary | ICD-10-CM

## 2015-11-15 NOTE — Therapy (Signed)
[facility] [place], [place], [address] Phone: [phone]   Fax:  [phone]  Physical Therapy Treatment  Patient Details  Name: Teresa Holloway MRN: [id number] Date of Birth: Mar 27, 1941 Referring Provider: Frederik Pear MD.  Encounter Date: 11/15/2015      PT End of Session - 11/15/15 1328    Visit Number 3   Number of Visits 16   Date for PT Re-Evaluation 01/05/16   PT Start Time [id number]   PT Stop Time 1408   PT Time Calculation (min) 52 min   Activity Tolerance Patient limited by pain   Behavior During Therapy [facility] for tasks assessed/performed      Past Medical History:  Diagnosis Date  . Arthritis   . CAD (coronary artery disease)   . Chronic back pain   . GERD (gastroesophageal reflux disease)   . Hyperlipidemia   . Hypertension   . MI (myocardial infarction) ([facility])   . Osteopenia     Past Surgical History:  Procedure Laterality Date  . ECTOPIC PREGNANCY SURGERY    . left breast biospy    . left wrist fracture      There were no vitals filed for this visit.      Subjective Assessment - 11/15/15 1320    Subjective Patient reported last treatment helped, and would like to continue with same treatment   Limitations Walking   How long can you walk comfortably? Short distances.   Patient Stated Goals Get out of pain.   Currently in Pain? Yes   Pain Score 4    Pain Location Hip   Pain Orientation Left   Pain Descriptors / Indicators Aching   Pain Type Chronic pain   Pain Onset More than a month ago   Pain Frequency Constant   Aggravating Factors  walking   Pain Relieving Factors at rest                         [facility] Adult PT Treatment/Exercise - 11/15/15 0001      Modalities   Modalities Electrical Stimulation;Ultrasound;Moist Heat     Moist Heat Therapy   Number Minutes Moist Heat 15 Minutes   Moist Heat Location Hip     Electrical Stimulation   Electrical Stimulation  Location Right hip.   Electrical Stimulation Action IFC   Electrical Stimulation Parameters 1-10hz    Electrical Stimulation Goals Pain     Ultrasound   Ultrasound Location right hip   Ultrasound Parameters 1.5w/cm2/50%/60mhx x92min   Ultrasound Goals Pain     Manual Therapy   Manual Therapy Soft tissue mobilization;Myofascial release   Soft tissue mobilization manual and IASTM to right hip                  PT Short Term Goals - 11/06/15 1510      PT SHORT TERM GOAL #1   Title Ind with a HEP.   Time 2   Period Weeks   Status New           PT Long Term Goals - 11/06/15 1510      PT LONG TERM GOAL #1   Title Perform ADL's with pain not > 3-4/10.   Time 8   Period Weeks   Status New     PT LONG TERM GOAL #2   Title Right hip strength to 4+/5 to help normalize gait pattern.   Time 8   Period Weeks   Status  New     PT LONG TERM GOAL #3   Title Average daily right hip pain-level not to exceed 4/10.   Time 8   Period Weeks   Status New               Plan - 11/15/15 1333    Clinical Impression Statement Patient tolerated treatment well today and has responded to treatments well overall. Patient reports less pain today and is able to feel less pain throughout day. patient has most pain with walking or laying on right side. Goals ongoing due to pain deficits.    Rehab Potential Good   PT Frequency 2x / week   PT Duration 8 weeks   PT Treatment/Interventions ADLs/Self Care Home Management;Cryotherapy;Electrical Stimulation;Moist Heat;Therapeutic exercise;Therapeutic activities;Ultrasound;Patient/family education;Manual techniques   PT Next Visit Plan IFC to right hip; then perform (left sdly position with pillows between knees) Combo e'stim/U/S and STW/M to right greater trochanteris region.  Right hip abduction strengthening.   Consulted and Agree with Plan of Care Patient      Patient will benefit from skilled therapeutic intervention in order to  improve the following deficits and impairments:  Pain, Decreased activity tolerance, Decreased strength, Abnormal gait  Visit Diagnosis: Pain in right hip  Muscle weakness (generalized)  Other abnormalities of gait and mobility     Problem List Patient Active Problem List   Diagnosis Date Noted  . BMI 29.0-29.9,adult 01/02/2015  . Osteoarthritis of left hip 08/23/2013  . Depression 08/23/2013  . Osteoporosis 06/23/2013  . Glaucoma of right eye 06/23/2013  . CAD (coronary artery disease) 08/19/2012  . History of MI (myocardial infarction) 08/19/2012  . Arthritis of both knees 08/19/2012  . Essential hypertension, benign 08/10/2012  . Hyperlipidemia with target LDL less than 100 08/10/2012  . GERD (gastroesophageal reflux disease) 08/10/2012    Etoile Looman P, PTA 11/15/2015, 2:14 PM  [facility] [place], [place], [address] Phone: [phone]   Fax:  [phone]  Name: ALYXANDREA OGRODNIK MRN: [id number] Date of Birth: Jul 06, 1940

## 2015-11-22 ENCOUNTER — Ambulatory Visit: Payer: Medicare Other | Attending: Orthopedic Surgery | Admitting: Physical Therapy

## 2015-11-22 ENCOUNTER — Encounter: Payer: Self-pay | Admitting: Physical Therapy

## 2015-11-22 DIAGNOSIS — M6281 Muscle weakness (generalized): Secondary | ICD-10-CM | POA: Diagnosis not present

## 2015-11-22 DIAGNOSIS — M25551 Pain in right hip: Secondary | ICD-10-CM | POA: Insufficient documentation

## 2015-11-22 DIAGNOSIS — R2689 Other abnormalities of gait and mobility: Secondary | ICD-10-CM | POA: Diagnosis not present

## 2015-11-22 NOTE — Therapy (Signed)
[facility] [place], [place], [address] Phone: [phone]   Fax:  [phone]  Physical Therapy Treatment  Patient Details  Name: Teresa Holloway MRN: [id number] Date of Birth: 1940-06-21 Referring Provider: Frederik Pear MD.  Encounter Date: 11/22/2015      PT End of Session - 11/22/15 1325    Visit Number 4   Number of Visits 16   Date for PT Re-Evaluation 01/05/16   PT Start Time [id number]   PT Stop Time 1403   PT Time Calculation (min) 50 min   Activity Tolerance Patient tolerated treatment well   Behavior During Therapy [facility] for tasks assessed/performed      Past Medical History:  Diagnosis Date  . Arthritis   . CAD (coronary artery disease)   . Chronic back pain   . GERD (gastroesophageal reflux disease)   . Hyperlipidemia   . Hypertension   . MI (myocardial infarction) ([place])   . Osteopenia     Past Surgical History:  Procedure Laterality Date  . ECTOPIC PREGNANCY SURGERY    . left breast biospy    . left wrist fracture      There were no vitals filed for this visit.      Subjective Assessment - 11/22/15 1318    Subjective Patient reported last treatment helped, and would like to continue with same treatment, some soreness afer walking dog today   Limitations Walking   How long can you walk comfortably? Short distances.   Patient Stated Goals Get out of pain.   Currently in Pain? Yes   Pain Score 5    Pain Location Hip   Pain Orientation Left   Pain Descriptors / Indicators Aching   Pain Type Chronic pain   Pain Onset More than a month ago   Pain Frequency Constant   Aggravating Factors  walking   Pain Relieving Factors at rest                         [facility] Adult PT Treatment/Exercise - 11/22/15 0001      Moist Heat Therapy   Number Minutes Moist Heat 15 Minutes   Moist Heat Location Hip     Electrical Stimulation   Electrical Stimulation Location Right hip.   Electrical Stimulation Action IFC   Electrical Stimulation Parameters 1-10hz    Electrical Stimulation Goals Pain     Ultrasound   Ultrasound Location right right hip   Ultrasound Parameters 1.5w/cm2/50%/70mhz x28min   Ultrasound Goals Pain     Manual Therapy   Manual Therapy Soft tissue mobilization;Myofascial release   Soft tissue mobilization manual and IASTM to right hip                  PT Short Term Goals - 11/06/15 1510      PT SHORT TERM GOAL #1   Title Ind with a HEP.   Time 2   Period Weeks   Status New           PT Long Term Goals - 11/06/15 1510      PT LONG TERM GOAL #1   Title Perform ADL's with pain not > 3-4/10.   Time 8   Period Weeks   Status New     PT LONG TERM GOAL #2   Title Right hip strength to 4+/5 to help normalize gait pattern.   Time 8   Period Weeks   Status New  PT LONG TERM GOAL #3   Title Average daily right hip pain-level not to exceed 4/10.   Time 8   Period Weeks   Status New               Plan - 11/22/15 1347    Clinical Impression Statement Patient tolerated treatment well today and continues to respond with relief after visits. Patient had some increased pain after walking her dog earlier today. Patient has reported increased pain with any prolong walking. Patient goals ongoing due to pain defifits.   Rehab Potential Good   PT Frequency 2x / week   PT Duration 8 weeks   PT Treatment/Interventions ADLs/Self Care Home Management;Cryotherapy;Electrical Stimulation;Moist Heat;Therapeutic exercise;Therapeutic activities;Ultrasound;Patient/family education;Manual techniques   PT Next Visit Plan IFC to right hip; then perform (left sdly position with pillows between knees) Combo e'stim/U/S and STW/M to right greater trochanteris region.  Right hip abduction strengthening.   Consulted and Agree with Plan of Care Patient      Patient will benefit from skilled therapeutic intervention in order to improve the  following deficits and impairments:  Pain, Decreased activity tolerance, Decreased strength, Abnormal gait  Visit Diagnosis: Pain in right hip  Muscle weakness (generalized)  Other abnormalities of gait and mobility     Problem List Patient Active Problem List   Diagnosis Date Noted  . BMI 29.0-29.9,adult 01/02/2015  . Osteoarthritis of left hip 08/23/2013  . Depression 08/23/2013  . Osteoporosis 06/23/2013  . Glaucoma of right eye 06/23/2013  . CAD (coronary artery disease) 08/19/2012  . History of MI (myocardial infarction) 08/19/2012  . Arthritis of both knees 08/19/2012  . Essential hypertension, benign 08/10/2012  . Hyperlipidemia with target LDL less than 100 08/10/2012  . GERD (gastroesophageal reflux disease) 08/10/2012    Woods Gangemi P, PTA 11/22/2015, 2:06 PM  [facility] [place], [place], [address] Phone: [phone]   Fax:  [phone]  Name: Teresa Holloway MRN: [id number] Date of Birth: Nov 19, 1940

## 2015-11-29 ENCOUNTER — Ambulatory Visit: Payer: Medicare Other

## 2015-11-30 ENCOUNTER — Encounter: Payer: Self-pay | Admitting: Physical Therapy

## 2015-11-30 ENCOUNTER — Ambulatory Visit: Payer: Medicare Other | Admitting: Physical Therapy

## 2015-11-30 DIAGNOSIS — R2689 Other abnormalities of gait and mobility: Secondary | ICD-10-CM

## 2015-11-30 DIAGNOSIS — M6281 Muscle weakness (generalized): Secondary | ICD-10-CM

## 2015-11-30 DIAGNOSIS — M25551 Pain in right hip: Secondary | ICD-10-CM | POA: Diagnosis not present

## 2015-11-30 NOTE — Patient Instructions (Signed)
  Piriformis (Supine)   Cross legs, right on top. Gently pull other knee toward chest until stretch is felt in buttock/hip of top leg. Hold _20___ seconds. Repeat _3-5___ times per set. Do __1-2__ sets per session. Do _2-3___ sessions per day.  http://orth.exer.us/676   Copyright  VHI. All rights reserved.  Stretching: Tensor   Cross right leg over the other, then lean to same side until stretch is felt on other hip. Hold __20__ seconds. Repeat _2-5___ times per set. Do __1-2__ sets per session. Do _2-3___ sessions per day.   ABDUCTION: Standing (Active)   Stand, feet flat. Lift right leg out to side. Use _0__ lbs. Complete __10-30_ repetitions. Perform __2_ sessions per day.

## 2015-11-30 NOTE — Therapy (Signed)
PT LONG TERM GOAL #1   Title Perform ADL's with pain not > 3-4/10.   Time 8   Period Weeks   Status On-going  up to 10/10 11/30/15     PT LONG TERM GOAL #2   Title Right hip strength to 4+/5 to help normalize gait pattern.   Time 8   Period Weeks   Status On-going     PT LONG TERM GOAL #3   Title Average daily right hip pain-level not to exceed 4/10.   Time 8   Period Weeks   Status On-going  8/10 11/30/15               Plan - 11/30/15 1418    Clinical Impression Statement Patient continues to progress and respond well to treatments. Patient has reported 50% improvement overall. Patient has continued increase of pain with prolong standing or ADL's. Patient able to sleep and lay on side with decreased pain and has noted less palpable pain. Patient was given HEP today for gentle stretching and strengthening. Patient met STG #1 and LTG's are ongoing due to pain deficits.    Rehab Potential Good    PT Frequency 2x / week   PT Duration 8 weeks   PT Treatment/Interventions ADLs/Self Care Home Management;Cryotherapy;Electrical Stimulation;Moist Heat;Therapeutic exercise;Therapeutic activities;Ultrasound;Patient/family education;Manual techniques   PT Next Visit Plan cont with POC and cont per MD (MD. Frederik Pear 12/05/15)   Consulted and Agree with Plan of Care Patient      Patient will benefit from skilled therapeutic intervention in order to improve the following deficits and impairments:  Pain, Decreased activity tolerance, Decreased strength, Abnormal gait  Visit Diagnosis: Pain in right hip  Muscle weakness (generalized)  Other abnormalities of gait and mobility     Problem List Patient Active Problem List   Diagnosis Date Noted  . BMI 29.0-29.9,adult 01/02/2015  . Osteoarthritis of left hip 08/23/2013  . Depression 08/23/2013  . Osteoporosis 06/23/2013  . Glaucoma of right eye 06/23/2013  . CAD (coronary artery disease) 08/19/2012  . History of MI (myocardial infarction) 08/19/2012  . Arthritis of both knees 08/19/2012  . Essential hypertension, benign 08/10/2012  . Hyperlipidemia with target LDL less than 100 08/10/2012  . GERD (gastroesophageal reflux disease) 08/10/2012    Thorsten Climer P, PTA 11/30/2015, 2:37 PM   Ladean Raya, PTA 11/30/15 2:37 PM   Laureen Abrahams, PT, DPT 11/30/15 2:45 PM   [facility] [address] [place], [place], [address] Phone: [phone]   Fax:  [phone]  Name: Teresa Holloway MRN: [id number] Date of Birth: April 15, 1941  [facility] [place], [place], [address] Phone: [phone]   Fax:  [phone]  Physical Therapy Treatment  Patient Details  Name: Teresa Holloway MRN: [id number] Date of Birth: 08-Apr-1941 Referring Provider: Frederik Pear MD.  Encounter Date: 11/30/2015      PT End of Session - 11/30/15 1357    Visit Number 5   Number of Visits 16   Date for PT Re-Evaluation 01/05/16   PT Start Time 2500   PT Stop Time 3704   PT Time Calculation (min) 41 min   Activity Tolerance Patient tolerated treatment well   Behavior During Therapy [facility] for tasks assessed/performed      Past Medical History:  Diagnosis Date  . Arthritis   . CAD (coronary artery disease)   . Chronic back pain   . GERD (gastroesophageal reflux disease)   . Hyperlipidemia   . Hypertension   . MI (myocardial infarction) ([place])   . Osteopenia     Past Surgical History:  Procedure Laterality Date  . ECTOPIC PREGNANCY SURGERY    . left breast biospy    . left wrist fracture      There were no vitals filed for this visit.      Subjective Assessment - 11/30/15 1352    Subjective Patient reported 50% improvement overall   Limitations Walking   How long can you walk comfortably? Short distances.   Patient Stated Goals Get out of pain.   Currently in Pain? Yes   Pain Score 5    Pain Location Hip   Pain Orientation Left   Pain Descriptors / Indicators Aching   Pain Type Chronic pain   Pain Onset More than a month ago   Pain Frequency Constant   Aggravating Factors  prolong walking   Pain Relieving Factors at rest                         [facility] Adult PT Treatment/Exercise - 11/30/15 0001      Exercises   Exercises Knee/Hip     Knee/Hip Exercises: Stretches   ITB Stretch Right;3 reps;30 seconds   Piriformis Stretch Right;30 seconds;3 reps     Knee/Hip Exercises: Standing   Hip ADduction Right;10 reps;Strengthening;AROM     Moist Heat Therapy   Number Minutes Moist Heat 15 Minutes   Moist Heat Location Hip     Electrical Stimulation   Electrical Stimulation Location Right hip.   Electrical Stimulation Action IFC   Electrical Stimulation Parameters 1-'10hz'    Electrical Stimulation Goals Pain     Ultrasound   Ultrasound Location right hip   Ultrasound Parameters 1.5w/cm2/50%/54mz x13m   Ultrasound Goals Pain     Manual Therapy   Manual Therapy Soft tissue mobilization;Myofascial release   Soft tissue mobilization manual and IASTM to right hip                PT Education - 11/30/15 1403    Education provided Yes   Education Details HEP   Person(s) Educated Patient   Methods Explanation;Demonstration;Handout   Comprehension Verbalized understanding;Returned demonstration          PT Short Term Goals - 11/30/15 1400      PT SHORT TERM GOAL #1   Title Ind with a HEP.   Time 2   Period Weeks   Status Achieved  11/30/15           PT Long Term Goals - 11/30/15 1401

## 2015-12-05 DIAGNOSIS — M1712 Unilateral primary osteoarthritis, left knee: Secondary | ICD-10-CM | POA: Diagnosis not present

## 2015-12-06 ENCOUNTER — Encounter: Payer: Self-pay | Admitting: Physical Therapy

## 2015-12-06 ENCOUNTER — Ambulatory Visit: Payer: Medicare Other | Admitting: Physical Therapy

## 2015-12-06 DIAGNOSIS — M6281 Muscle weakness (generalized): Secondary | ICD-10-CM

## 2015-12-06 DIAGNOSIS — R2689 Other abnormalities of gait and mobility: Secondary | ICD-10-CM | POA: Diagnosis not present

## 2015-12-06 DIAGNOSIS — M25551 Pain in right hip: Secondary | ICD-10-CM

## 2015-12-06 NOTE — Therapy (Signed)
[facility] [address] [place], [place], [address] Phone: [phone]   Fax:  [phone]  Physical Therapy Treatment  Patient Details  Name: Teresa Holloway MRN: [id number] Date of Birth: [birth year] Referring Provider: Gean Birchwood MD.  Encounter Date: 12/06/2015      PT End of Session - 12/06/15 1328    Visit Number 6   Number of Visits 16   Date for PT Re-Evaluation 01/05/16   PT Start Time 1316   PT Stop Time 1358   PT Time Calculation (min) 42 min   Activity Tolerance Patient tolerated treatment well   Behavior During Therapy [facility] for tasks assessed/performed      Past Medical History:  Diagnosis Date  . Arthritis   . CAD (coronary artery disease)   . Chronic back pain   . GERD (gastroesophageal reflux disease)   . Hyperlipidemia   . Hypertension   . MI (myocardial infarction) ([facility])   . Osteopenia     Past Surgical History:  Procedure Laterality Date  . ECTOPIC PREGNANCY SURGERY    . left breast biospy    . left wrist fracture      There were no vitals filed for this visit.      Subjective Assessment - 12/06/15 1320    Subjective Patient has reported less pain overall/patient went to MD and is to continue therapy as needed or until goals met, she is also having a series of left knee injections to help pain in her knee   Limitations Walking   How long can you walk comfortably? Short distances.   Patient Stated Goals Get out of pain.   Currently in Pain? Yes   Pain Score 4    Pain Location Hip   Pain Orientation Left   Pain Descriptors / Indicators Aching   Pain Type Chronic pain   Pain Onset More than a month ago   Pain Frequency Constant   Aggravating Factors  prolong walking   Pain Relieving Factors at rest                         [facility] Adult PT Treatment/Exercise - 12/06/15 0001      Moist Heat Therapy   Number Minutes Moist Heat 15 Minutes   Moist Heat Location Hip     Electrical Stimulation   Electrical Stimulation Location Right hip.   Electrical Stimulation Action IFC   Electrical Stimulation Parameters 1-10hz    Electrical Stimulation Goals Pain     Ultrasound   Ultrasound Location right hip   Ultrasound Parameters 1.5w/cm2/50%/76mhz x65min   Ultrasound Goals Pain     Manual Therapy   Manual Therapy Soft tissue mobilization;Myofascial release   Soft tissue mobilization manual and IASTM to right hip                  PT Short Term Goals - 11/30/15 1400      PT SHORT TERM GOAL #1   Title Ind with a HEP.   Time 2   Period Weeks   Status Achieved  11/30/15           PT Long Term Goals - 12/06/15 1329      PT LONG TERM GOAL #1   Title Perform ADL's with pain not > 3-4/10.   Period Weeks   Status Achieved  3-4/10 pain with all ADL's 12/06/15     PT LONG TERM GOAL #2   Title Right hip strength to  4+/5 to help normalize gait pattern.   Time 8   Period Weeks   Status On-going     PT LONG TERM GOAL #3   Title Average daily right hip pain-level not to exceed 4/10.   Time 8   Period Weeks   Status On-going  5-6/10 pain 12/06/15               Plan - 12/06/15 1331    Clinical Impression Statement Patient progressing overall with decreased pain reports today and decreased pain with ADL's. Patient able to complete all ADL's with pain no more than 3-4/10pain. Patient has more pain with prolong standing and at end of day. Patient met LTG#1 today and others ongoing due to pain deficits.   Rehab Potential Good   PT Frequency 2x / week   PT Duration 8 weeks   PT Treatment/Interventions ADLs/Self Care Home Management;Cryotherapy;Electrical Stimulation;Moist Heat;Therapeutic exercise;Therapeutic activities;Ultrasound;Patient/family education;Manual techniques   PT Next Visit Plan cont with POC and cont per MD    Consulted and Agree with Plan of Care Patient      Patient will benefit from skilled therapeutic intervention in  order to improve the following deficits and impairments:  Pain, Decreased activity tolerance, Decreased strength, Abnormal gait  Visit Diagnosis: Pain in right hip  Muscle weakness (generalized)  Other abnormalities of gait and mobility     Problem List Patient Active Problem List   Diagnosis Date Noted  . BMI 29.0-29.9,adult 01/02/2015  . Osteoarthritis of left hip 08/23/2013  . Depression 08/23/2013  . Osteoporosis 06/23/2013  . Glaucoma of right eye 06/23/2013  . CAD (coronary artery disease) 08/19/2012  . History of MI (myocardial infarction) 08/19/2012  . Arthritis of both knees 08/19/2012  . Essential hypertension, benign 08/10/2012  . Hyperlipidemia with target LDL less than 100 08/10/2012  . GERD (gastroesophageal reflux disease) 08/10/2012    Graycen Sadlon P, PTA 12/06/2015, 1:58 PM  [facility] [address] [place], [place], [address] Phone: [phone]   Fax:  [phone]  Name: Teresa Holloway MRN: [id number] Date of Birth: 1940/05/14

## 2015-12-12 DIAGNOSIS — M1712 Unilateral primary osteoarthritis, left knee: Secondary | ICD-10-CM | POA: Diagnosis not present

## 2015-12-14 ENCOUNTER — Ambulatory Visit: Payer: Medicare Other | Admitting: *Deleted

## 2015-12-14 DIAGNOSIS — M25551 Pain in right hip: Secondary | ICD-10-CM | POA: Diagnosis not present

## 2015-12-14 DIAGNOSIS — M6281 Muscle weakness (generalized): Secondary | ICD-10-CM | POA: Diagnosis not present

## 2015-12-14 DIAGNOSIS — R2689 Other abnormalities of gait and mobility: Secondary | ICD-10-CM

## 2015-12-14 NOTE — Therapy (Addendum)
Teresa Holloway, [place], [address] Phone: [phone]   Fax:  [phone]  Physical Therapy Treatment  Patient Details  Name: Teresa Holloway MRN: [id number] Date of Birth: 12-Dec-1940 Referring Provider: Frederik Pear MD.  Encounter Date: 12/14/2015      PT End of Session - 12/14/15 1431    Visit Number 7   Number of Visits 16   Date for PT Re-Evaluation 01/05/16   PT Start Time 1324   PT Stop Time 1435   PT Time Calculation (min) 50 min   Activity Tolerance Patient tolerated treatment well   Behavior During Therapy [facility] for tasks assessed/performed      Past Medical History:  Diagnosis Date  . Arthritis   . CAD (coronary artery disease)   . Chronic back pain   . GERD (gastroesophageal reflux disease)   . Hyperlipidemia   . Hypertension   . MI (myocardial infarction) ([place])   . Osteopenia     Past Surgical History:  Procedure Laterality Date  . ECTOPIC PREGNANCY SURGERY    . left breast biospy    . left wrist fracture      There were no vitals filed for this visit.      Subjective Assessment - 12/14/15 1403    Subjective Patient has reported less pain overall/patient went to MD and is to continue therapy as needed or until goals met, she is also having a series of left knee injections to help pain in her knee   Limitations Walking   How long can you walk comfortably? Short distances.   Patient Stated Goals Get out of pain.   Currently in Pain? Yes   Pain Score 5    Pain Location Hip   Pain Orientation Right   Pain Descriptors / Indicators Aching   Pain Type Chronic pain   Pain Onset More than a month ago   Pain Frequency Constant                         [facility] Adult PT Treatment/Exercise - 12/14/15 0001      Moist Heat Therapy   Number Minutes Moist Heat 15 Minutes   Moist Heat Location Hip     Electrical Stimulation   Electrical Stimulation Location Right hip.   Electrical Stimulation Action IFC   Electrical Stimulation Parameters 1-_0  x 15 ins   Electrical Stimulation Goals Pain     Ultrasound   Ultrasound Location Rt hip   Ultrasound Parameters 1.5 w/cm2 x 10 mins LT sidelying   Ultrasound Goals Pain     Manual Therapy   Manual Therapy Soft tissue mobilization;Myofascial release   Soft tissue mobilization manual and IASTM around  right hip and into piriformis LT sidelying                  PT Short Term Goals - 11/30/15 1400      PT SHORT TERM GOAL #1   Title Ind with a HEP.   Time 2   Period Weeks   Status Achieved  11/30/15           PT Long Term Goals - 12/06/15 1329      PT LONG TERM GOAL #1   Title Perform ADL's with pain not > 3-4/10.   Period Weeks   Status Achieved  3-4/10 pain with all ADL's 12/06/15     PT LONG TERM GOAL #2   Title Right hip strength to  4+/5 to help normalize gait pattern.   Time 8   Period Weeks   Status On-going     PT LONG TERM GOAL #3   Title Average daily right hip pain-level not to exceed 4/10.   Time 8   Period Weeks   Status On-going  5-6/10 pain 12/06/15               Plan - 12/14/15 1440    Clinical Impression Statement Pt not doing as well today due to increased pain in RT hip. She was experiencing more pain during WB today. She did have increased tightness and soreness in RT glute and piriformis today found during STW Goals ongoing due to pain   Rehab Potential Good   PT Frequency 2x / week   PT Duration 8 weeks   PT Treatment/Interventions ADLs/Self Care Home Management;Cryotherapy;Electrical Stimulation;Moist Heat;Therapeutic exercise;Therapeutic activities;Ultrasound;Patient/family education;Manual techniques   PT Next Visit Plan cont with POC and cont per MD    Consulted and Agree with Plan of Care Patient      Patient will benefit from skilled therapeutic intervention in order to improve the following deficits and impairments:  Pain, Decreased activity  tolerance, Decreased strength, Abnormal gait  Visit Diagnosis: Pain in right hip  Muscle weakness (generalized)  Other abnormalities of gait and mobility     Problem List Patient Active Problem List   Diagnosis Date Noted  . BMI 29.0-29.9,adult 01/02/2015  . Osteoarthritis of left hip 08/23/2013  . Depression 08/23/2013  . Osteoporosis 06/23/2013  . Glaucoma of right eye 06/23/2013  . CAD (coronary artery disease) 08/19/2012  . History of MI (myocardial infarction) 08/19/2012  . Arthritis of both knees 08/19/2012  . Essential hypertension, benign 08/10/2012  . Hyperlipidemia with target LDL less than 100 08/10/2012  . GERD (gastroesophageal reflux disease) 08/10/2012    Emma-Lee Oddo,CHRIS, PTA 12/14/2015, 2:57 PM  [facility] [place], [place], [address] Phone: [phone]   Fax:  [phone]  Name: LOREA Holloway MRN: [id number] Date of Birth: 1941-02-22  PHYSICAL THERAPY DISCHARGE SUMMARY  Visits from Start of Care: 7.  Current functional level related to goals / functional outcomes: See above.   Remaining deficits: Continued right hip pain.   Education / Equipment: HEP. Plan: Patient agrees to discharge.  Patient goals were partially met. Patient is being discharged due to not returning since the last visit.  ?????         Mali Applegate MPT

## 2015-12-19 DIAGNOSIS — M1712 Unilateral primary osteoarthritis, left knee: Secondary | ICD-10-CM | POA: Diagnosis not present

## 2015-12-21 ENCOUNTER — Encounter: Payer: PRIVATE HEALTH INSURANCE | Admitting: *Deleted

## 2015-12-22 DIAGNOSIS — H40013 Open angle with borderline findings, low risk, bilateral: Secondary | ICD-10-CM | POA: Diagnosis not present

## 2015-12-22 DIAGNOSIS — H2513 Age-related nuclear cataract, bilateral: Secondary | ICD-10-CM | POA: Diagnosis not present

## 2015-12-22 DIAGNOSIS — H2589 Other age-related cataract: Secondary | ICD-10-CM | POA: Diagnosis not present

## 2015-12-22 DIAGNOSIS — H268 Other specified cataract: Secondary | ICD-10-CM | POA: Diagnosis not present

## 2015-12-26 DIAGNOSIS — M1712 Unilateral primary osteoarthritis, left knee: Secondary | ICD-10-CM | POA: Diagnosis not present

## 2016-01-02 DIAGNOSIS — M1712 Unilateral primary osteoarthritis, left knee: Secondary | ICD-10-CM | POA: Diagnosis not present

## 2016-01-14 ENCOUNTER — Other Ambulatory Visit: Payer: Self-pay | Admitting: Nurse Practitioner

## 2016-01-15 ENCOUNTER — Encounter: Payer: Medicare Other | Admitting: *Deleted

## 2016-01-23 ENCOUNTER — Telehealth: Payer: Self-pay | Admitting: Nurse Practitioner

## 2016-01-23 ENCOUNTER — Other Ambulatory Visit: Payer: Self-pay | Admitting: Nurse Practitioner

## 2016-01-23 DIAGNOSIS — I10 Essential (primary) hypertension: Secondary | ICD-10-CM

## 2016-01-23 DIAGNOSIS — M1612 Unilateral primary osteoarthritis, left hip: Secondary | ICD-10-CM

## 2016-01-23 MED ORDER — HYDROCHLOROTHIAZIDE 25 MG PO TABS: ORAL_TABLET | ORAL | 1 refills | Status: DC

## 2016-01-23 MED ORDER — HYDROCODONE-ACETAMINOPHEN 5-325 MG PO TABS: 1.0000 | ORAL_TABLET | Freq: Four times a day (QID) | ORAL | 0 refills | Status: DC | PRN

## 2016-01-23 NOTE — Telephone Encounter (Signed)
Patient aware, prescription placed at front desk

## 2016-01-23 NOTE — Telephone Encounter (Signed)
Hydrocodone rx ready for pick up- have to be een every 3 months to get pain meds- ccnnot call in in future to have filled.

## 2016-02-01 DIAGNOSIS — M1712 Unilateral primary osteoarthritis, left knee: Secondary | ICD-10-CM | POA: Diagnosis not present

## 2016-02-15 ENCOUNTER — Encounter: Payer: Self-pay | Admitting: Nurse Practitioner

## 2016-02-15 ENCOUNTER — Ambulatory Visit (INDEPENDENT_AMBULATORY_CARE_PROVIDER_SITE_OTHER): Payer: Medicare Other | Admitting: Nurse Practitioner

## 2016-02-15 VITALS — BP 111/64 | HR 58 | Temp 97.6°F | Ht 64.0 in | Wt 180.0 lb

## 2016-02-15 DIAGNOSIS — M1612 Unilateral primary osteoarthritis, left hip: Secondary | ICD-10-CM

## 2016-02-15 DIAGNOSIS — I25111 Atherosclerotic heart disease of native coronary artery with angina pectoris with documented spasm: Secondary | ICD-10-CM | POA: Diagnosis not present

## 2016-02-15 DIAGNOSIS — I10 Essential (primary) hypertension: Secondary | ICD-10-CM | POA: Diagnosis not present

## 2016-02-15 DIAGNOSIS — Z6830 Body mass index (BMI) 30.0-30.9, adult: Secondary | ICD-10-CM | POA: Diagnosis not present

## 2016-02-15 DIAGNOSIS — K219 Gastro-esophageal reflux disease without esophagitis: Secondary | ICD-10-CM | POA: Diagnosis not present

## 2016-02-15 DIAGNOSIS — M170 Bilateral primary osteoarthritis of knee: Secondary | ICD-10-CM

## 2016-02-15 DIAGNOSIS — F3342 Major depressive disorder, recurrent, in full remission: Secondary | ICD-10-CM

## 2016-02-15 DIAGNOSIS — E785 Hyperlipidemia, unspecified: Secondary | ICD-10-CM | POA: Diagnosis not present

## 2016-02-15 MED ORDER — HYDROCODONE-ACETAMINOPHEN 5-325 MG PO TABS: 1.0000 | ORAL_TABLET | Freq: Four times a day (QID) | ORAL | 0 refills | Status: DC | PRN

## 2016-02-15 NOTE — Patient Instructions (Signed)

## 2016-02-15 NOTE — Progress Notes (Signed)
Subjective:    Patient ID: Teresa Holloway, female    DOB: [birth year], 75 y.o.   MRN: [id number]  Patient here today for follow up of chronic medical problems. NP changes since last visit. No complaints today.  Outpatient Encounter Prescriptions as of 02/15/2016  Medication Sig  . amLODipine (NORVASC) 10 MG tablet Take 1 tablet (10 mg total) by mouth daily.  Marland Kitchen aspirin 81 MG tablet Take 81 mg by mouth daily.  Marland Kitchen atorvastatin (LIPITOR) 40 MG tablet Take 1 tablet (40 mg total) by mouth daily at 6 PM.  . benazepril (LOTENSIN) 40 MG tablet Take 1 tablet (40 mg total) by mouth daily.  . cholecalciferol (VITAMIN D) 1000 UNITS tablet Take 1,000 Units by mouth daily.  . clopidogrel (PLAVIX) 75 MG tablet Take 1 tablet (75 mg total) by mouth daily.  . [facility] RANITIDINE 75 MG tablet TAKE 1 TABLET (75 MG TOTAL) BY MOUTH DAILY.  . hydrochlorothiazide (HYDRODIURIL) 25 MG tablet TAKE 1 TABLET (25 MG TOTAL) BY MOUTH DAILY.  Marland Kitchen HYDROcodone-acetaminophen (NORCO) 5-325 MG tablet Take 1 tablet by mouth every 6 (six) hours as needed for moderate pain.  Marland Kitchen HYDROcodone-acetaminophen (NORCO/VICODIN) 5-325 MG tablet TAKE 1 TABLET BY MOUTH EVERY 6 HOURS AS NEEDED FOR PAIN  . HYDROcodone-acetaminophen (NORCO/VICODIN) 5-325 MG tablet Take 1 tablet by mouth every 6 (six) hours as needed for moderate pain.  . isosorbide mononitrate (IMDUR) 60 MG 24 hr tablet TAKE 1 TABLET (60 MG TOTAL) BY MOUTH DAILY.  Marland Kitchen latanoprost (XALATAN) 0.005 % ophthalmic solution Place 1 drop into both eyes at bedtime.  Marland Kitchen LUMIGAN 0.01 % SOLN USE 1 DROP AT BEDTIME INTO BOTH EYES  . metoprolol succinate (TOPROL-XL) 100 MG 24 hr tablet Take 1 tablet (100 mg total) by mouth daily.  Marland Kitchen NITROSTAT 0.4 MG SL tablet Place 0.4 mg under the tongue every 5 (five) minutes as needed for chest pain.   Marland Kitchen PARoxetine (PAXIL) 40 MG tablet Take 1 tablet (40 mg total) by mouth daily.    Hypertension  This is a chronic problem. The current episode started more than 1 year  ago. The problem is controlled. Pertinent negatives include no chest pain, headaches, neck pain or shortness of breath. Risk factors for coronary artery disease include dyslipidemia and post-menopausal state. Past treatments include ACE inhibitors and diuretics. The current treatment provides moderate improvement. Compliance problems include diet and exercise.   Hyperlipidemia  This is a chronic problem. The current episode started more than 1 year ago. The problem is uncontrolled. Recent lipid tests were reviewed and are high. She has no history of diabetes or hypothyroidism. Pertinent negatives include no chest pain or shortness of breath. Current antihyperlipidemic treatment includes statins. The current treatment provides moderate improvement of lipids. Compliance problems include adherence to diet and adherence to exercise.  Risk factors for coronary artery disease include dyslipidemia and hypertension.  CAD/MI Currently on Imdurr - no C/O chest pain or SOB or Dyspnea- Sees Dr. Dorena Cookey sees yearly and her next appointment is April next month. Depression Paxil daily  Which is working well for her- no c/o side effects Vitamin D deficiency  Vitamin D OTC- No side effects Osteoarthritis bil knees and hips- takes hydrocodone BID and says she can't walk without it due to the pain, she is considering surgery as a remedy. Takes norco daily for pain.Ortho is setting her up for physical therapy Pain assessment: Cause of pain-  Osteo arthritis Pain location- knees and hips Pain on scale of 1-10-  7-8/10 Frequency- daily What increases pain-walking and standing What makes pain Better-resting Effects on ADL - just takes her longer to do ADL's then use to Any change in general medical condition-none  Current medications- norco Effectiveness of current meds-drops pain to 3-4/10 Adverse reactions form pain meds- none  Pill count performed-No Urine drug screen- No GERD Zantac working well to keep  symptoms under control. Osteoporosis Patient had  Bone density test in March of this year. Currently on  No meds.   Review of Systems  Constitutional: Negative.   HENT: Negative.   Respiratory: Negative for shortness of breath.   Cardiovascular: Negative for chest pain.  Genitourinary: Positive for urgency. Negative for dysuria and frequency.  Musculoskeletal: Negative for neck pain.  Neurological: Negative for headaches.  Psychiatric/Behavioral: Negative.   All other systems reviewed and are negative.      Objective:   Physical Exam  Constitutional: She is oriented to person, place, and time. She appears well-developed and well-nourished.  HENT:  Nose: Nose normal.  Mouth/Throat: Oropharynx is clear and moist.  Eyes: EOM are normal.  Neck: Trachea normal, normal range of motion and full passive range of motion without pain. Neck supple. No JVD present. Carotid bruit is not present. No thyromegaly present.  Cardiovascular: Normal rate, regular rhythm, normal heart sounds and intact distal pulses.  Exam reveals no gallop and no friction rub.   No murmur heard. Pulmonary/Chest: Effort normal and breath sounds normal.  Abdominal: Soft. Bowel sounds are normal. She exhibits no distension and no mass. There is no tenderness.  Musculoskeletal: Normal range of motion.  Lymphadenopathy:    She has no cervical adenopathy.  Neurological: She is alert and oriented to person, place, and time. She has normal reflexes.  Skin: Skin is warm and dry.  Psychiatric: She has a normal mood and affect. Her behavior is normal. Judgment and thought content normal.   BP 111/64   Pulse (!) 58   Temp 97.6 F (36.4 C) (Oral)   Ht 5\' 4"  (1.626 m)   Wt 180 lb (81.6 kg)   BMI 30.90 kg/m         Assessment & Plan:  1. Coronary artery disease involving native coronary artery of native heart with angina pectoris with documented spasm (HCC)  2. Essential hypertension, benign Do not add salt to  diet - CMP14+EGFR  3. Gastroesophageal reflux disease without esophagitis Avoid spicy foods Do not eat 2 hours prior to bedtime  4. Arthritis of both knees  5. Primary osteoarthritis of left hip  6. BMI 30.0-30.9,adult Discussed diet and exercise for person with BMI >25 Will recheck weight in 3-6 months  7. Recurrent major depressive disorder, in full remission (HCC) Stress management  8. Hyperlipidemia with target LDL less than 100 Low fat diet - Lipid panel  9. Osteoarthritis of left hip, unspecified osteoarthritis type Discussed pain management protocol at office Patient will RTO for pain management within the next month - HYDROcodone-acetaminophen (NORCO/VICODIN) 5-325 MG tablet; Take 1 tablet by mouth every 6 (six) hours as needed for moderate pain.  Dispense: 60 tablet; Refill: 0  * pateint wanted rx for phentermine to stop weight gain- told her I did noy feel comfortable rx this due to age and medical history. Patient understood but says that her sister is getting it and she has the same peoblems. Again I told patient that I did not feel it was safe for her to take this medication.  Labs pending Health maintenance reviewed Diet  and exercise encouraged Continue all meds Follow up  In 3 months   Mary-Margaret Daphine Deutscher, FNP

## 2016-02-16 LAB — LIPID PANEL
CHOLESTEROL TOTAL: 132 mg/dL (ref 100–199)
Chol/HDL Ratio: 2.2 ratio units (ref 0.0–4.4)
HDL: 61 mg/dL (ref >39)
LDL Calculated: 50 mg/dL (ref 0–99)
Triglycerides: 104 mg/dL (ref 0–149)
VLDL CHOLESTEROL CAL: 21 mg/dL (ref 5–40)

## 2016-02-16 LAB — CMP14+EGFR
A/G RATIO: 1.9 (ref 1.2–2.2)
ALK PHOS: 88 IU/L (ref 39–117)
ALT: 10 IU/L (ref 0–32)
AST: 13 IU/L (ref 0–40)
Albumin: 4.2 g/dL (ref 3.5–4.8)
BILIRUBIN TOTAL: 0.5 mg/dL (ref 0.0–1.2)
BUN/Creatinine Ratio: 19 (ref 12–28)
BUN: 16 mg/dL (ref 8–27)
CHLORIDE: 101 mmol/L (ref 96–106)
CO2: 29 mmol/L (ref 18–29)
Calcium: 10.1 mg/dL (ref 8.7–10.3)
Creatinine, Ser: 0.86 mg/dL (ref 0.57–1.00)
GFR calc Af Amer: 77 mL/min/{1.73_m2} (ref >59)
GFR calc non Af Amer: 67 mL/min/{1.73_m2} (ref >59)
GLUCOSE: 93 mg/dL (ref 65–99)
Globulin, Total: 2.2 g/dL (ref 1.5–4.5)
POTASSIUM: 4.1 mmol/L (ref 3.5–5.2)
Sodium: 141 mmol/L (ref 134–144)
TOTAL PROTEIN: 6.4 g/dL (ref 6.0–8.5)

## 2016-02-22 ENCOUNTER — Ambulatory Visit (INDEPENDENT_AMBULATORY_CARE_PROVIDER_SITE_OTHER): Payer: Medicare Other | Admitting: Nurse Practitioner

## 2016-02-22 ENCOUNTER — Encounter: Payer: Self-pay | Admitting: Nurse Practitioner

## 2016-02-22 VITALS — BP 102/58 | HR 59 | Temp 97.6°F | Ht 64.0 in | Wt 179.0 lb

## 2016-02-22 DIAGNOSIS — I25111 Atherosclerotic heart disease of native coronary artery with angina pectoris with documented spasm: Secondary | ICD-10-CM

## 2016-02-22 DIAGNOSIS — Z79891 Long term (current) use of opiate analgesic: Secondary | ICD-10-CM | POA: Diagnosis not present

## 2016-02-22 DIAGNOSIS — M1612 Unilateral primary osteoarthritis, left hip: Secondary | ICD-10-CM | POA: Diagnosis not present

## 2016-02-22 DIAGNOSIS — Z0289 Encounter for other administrative examinations: Secondary | ICD-10-CM | POA: Diagnosis not present

## 2016-02-22 DIAGNOSIS — Z23 Encounter for immunization: Secondary | ICD-10-CM | POA: Diagnosis not present

## 2016-02-22 DIAGNOSIS — F1120 Opioid dependence, uncomplicated: Secondary | ICD-10-CM | POA: Diagnosis not present

## 2016-02-22 MED ORDER — HYDROCODONE-ACETAMINOPHEN 5-325 MG PO TABS: 1.0000 | ORAL_TABLET | Freq: Four times a day (QID) | ORAL | 0 refills | Status: DC | PRN

## 2016-02-22 MED ORDER — HYDROCODONE-ACETAMINOPHEN 5-325 MG PO TABS: ORAL_TABLET | ORAL | 0 refills | Status: DC

## 2016-02-22 NOTE — Progress Notes (Signed)
Subjective:    Patient ID: Teresa Holloway, female    DOB: [birth year], 75 y.o.   MRN: [id number]  HPI Patient comes in today for pain management and initial assessment. [place] Controlled Substance Abuse database reviewed- Yes If yes- were their any concerning findings : no suspicious activity Depression screen [facility] 2/9 02/22/2016 02/15/2016 10/30/2015 07/14/2015 04/13/2015  Decreased Interest 0 0 0 0 0  Down, Depressed, Hopeless 0 0 0 0 0  PHQ - 2 Score 0 0 0 0 0    GAD 7 : Generalized Anxiety Score 02/22/2016  Nervous, Anxious, on Edge 0  Control/stop worrying 0  Worry too much - different things 0  Trouble relaxing 0  Restless 0  Easily annoyed or irritable 0  Afraid - awful might happen 0  Total GAD 7 Score 0   [place] drug screen performed- Yes  SOAPP  0= never  1= seldom  2=sometimes  3= often  4= very often  How often do you have mood swings? 0 How often do you smoke a cigarette within an hour after waling up? 0 How often have you taken medication other than the way that it was prescribed?0 How often have you used illegal drugs in the past 5 years? 0 How often, in your lifetime, have you had legal problems or been arrested? 0  Score 0  Alcohol Audit - How often during the last year have found that you: 0-Never   1- Less than monthly   2- Monthly     3-Weekly     4-daily or almost daily  - found that you were not able to stop drinking once you started- 0 -failed to do what was normally expected of you because of drinking- 0 -needed a first drink in the morning- 0 -had a feeling of guilt or remorse after drinking- 0 -are/were unable to remember what happened the night before because of your drinking- 0  0- NO   2- yes but not in last year  4- yes during last year -Have you or someone else been injured because of your drinking- 0 - Has anyone been concerned about your drinking or suggested you cut down- 0        TOTAL- 0  ( 0-7- alcohol education, 8-15-  simple advice, 16-19 simple advice plus counseling, 20-40 referral for evaluation and treatment 0   Designated Pharmacy- [facility]  Pain assessment: Cause of pain- osteoarthritis Pain location- right hip and bil knees Pain on scale of 1-10- hip 3/10 currentlly  Knees 5/10 currently Frequency- daily What increases pain- walking and standing increases pain- What makes pain Better- sitting or laying help some with pain Effects on ADL - some days hurts so much that she is not able to do much  Prior treatments tried and failed- gets cortisone shots every 3 months- needs TKR Current treatments- norco BID Morphine mg equivalent- 10  Pain management agreement reviewed and signed- Yes     Review of Systems  Constitutional: Negative.   HENT: Negative.   Respiratory: Negative.   Cardiovascular: Negative.   Gastrointestinal: Negative.   Genitourinary: Negative.   Musculoskeletal: Negative.   Neurological: Negative.   Psychiatric/Behavioral: Negative.   All other systems reviewed and are negative.      Objective:   Physical Exam  No physical assessment done today      Assessment & Plan:  1. Pain management contract agreement  2. Opioid type dependence, continuous (HCC)  3. Long-term current use of  opiate analgesic - ToxASSURE Select 13 (MW), Urine  4. Osteoarthritis of left hip, unspecified osteoarthritis type Reviewed pain contract - HYDROcodone-acetaminophen (NORCO) 5-325 MG tablet; Take 1 tablet by mouth every 6 (six) hours as needed for moderate pain.  Dispense: 60 tablet; Refill: 0 - HYDROcodone-acetaminophen (NORCO/VICODIN) 5-325 MG tablet; TAKE 1 TABLET BY MOUTH EVERY 6 HOURS AS NEEDED FOR PAIN  Dispense: 60 tablet; Refill: 0 - HYDROcodone-acetaminophen (NORCO/VICODIN) 5-325 MG tablet; Take 1 tablet by mouth every 6 (six) hours as needed for moderate pain.  Dispense: 60 tablet; Refill: 0  Mary-Margaret Daphine Deutscher, FNP

## 2016-02-29 LAB — TOXASSURE SELECT 13 (MW), URINE

## 2016-03-19 DIAGNOSIS — M1712 Unilateral primary osteoarthritis, left knee: Secondary | ICD-10-CM | POA: Diagnosis not present

## 2016-04-03 ENCOUNTER — Other Ambulatory Visit: Payer: Self-pay | Admitting: Nurse Practitioner

## 2016-04-20 ENCOUNTER — Other Ambulatory Visit: Payer: Self-pay | Admitting: Nurse Practitioner

## 2016-04-20 DIAGNOSIS — E785 Hyperlipidemia, unspecified: Secondary | ICD-10-CM

## 2016-05-20 ENCOUNTER — Ambulatory Visit (INDEPENDENT_AMBULATORY_CARE_PROVIDER_SITE_OTHER): Payer: Medicare Other | Admitting: Nurse Practitioner

## 2016-05-20 ENCOUNTER — Encounter: Payer: Self-pay | Admitting: Nurse Practitioner

## 2016-05-20 VITALS — BP 112/65 | HR 56 | Temp 98.4°F | Ht 64.0 in | Wt 171.0 lb

## 2016-05-20 DIAGNOSIS — F1120 Opioid dependence, uncomplicated: Secondary | ICD-10-CM

## 2016-05-20 DIAGNOSIS — Z6830 Body mass index (BMI) 30.0-30.9, adult: Secondary | ICD-10-CM

## 2016-05-20 DIAGNOSIS — I25111 Atherosclerotic heart disease of native coronary artery with angina pectoris with documented spasm: Secondary | ICD-10-CM | POA: Diagnosis not present

## 2016-05-20 DIAGNOSIS — M1612 Unilateral primary osteoarthritis, left hip: Secondary | ICD-10-CM

## 2016-05-20 DIAGNOSIS — E785 Hyperlipidemia, unspecified: Secondary | ICD-10-CM

## 2016-05-20 DIAGNOSIS — I10 Essential (primary) hypertension: Secondary | ICD-10-CM

## 2016-05-20 DIAGNOSIS — K219 Gastro-esophageal reflux disease without esophagitis: Secondary | ICD-10-CM | POA: Diagnosis not present

## 2016-05-20 DIAGNOSIS — F3342 Major depressive disorder, recurrent, in full remission: Secondary | ICD-10-CM

## 2016-05-20 DIAGNOSIS — L989 Disorder of the skin and subcutaneous tissue, unspecified: Secondary | ICD-10-CM

## 2016-05-20 MED ORDER — PAROXETINE HCL 40 MG PO TABS: 40.0000 mg | ORAL_TABLET | Freq: Every day | ORAL | 1 refills | Status: DC

## 2016-05-20 MED ORDER — ISOSORBIDE MONONITRATE ER 60 MG PO TB24: ORAL_TABLET | ORAL | 1 refills | Status: DC

## 2016-05-20 MED ORDER — HYDROCODONE-ACETAMINOPHEN 5-325 MG PO TABS: 1.0000 | ORAL_TABLET | Freq: Four times a day (QID) | ORAL | 0 refills | Status: DC | PRN

## 2016-05-20 MED ORDER — ATORVASTATIN CALCIUM 40 MG PO TABS: 40.0000 mg | ORAL_TABLET | Freq: Every day | ORAL | 0 refills | Status: DC

## 2016-05-20 MED ORDER — AMLODIPINE BESYLATE 10 MG PO TABS: 10.0000 mg | ORAL_TABLET | Freq: Every day | ORAL | 1 refills | Status: DC

## 2016-05-20 MED ORDER — BENAZEPRIL HCL 40 MG PO TABS: 40.0000 mg | ORAL_TABLET | Freq: Every day | ORAL | 1 refills | Status: DC

## 2016-05-20 MED ORDER — HYDROCHLOROTHIAZIDE 25 MG PO TABS: ORAL_TABLET | ORAL | 1 refills | Status: DC

## 2016-05-20 MED ORDER — HYDROCODONE-ACETAMINOPHEN 5-325 MG PO TABS: ORAL_TABLET | ORAL | 0 refills | Status: DC

## 2016-05-20 MED ORDER — CLOPIDOGREL BISULFATE 75 MG PO TABS: 75.0000 mg | ORAL_TABLET | Freq: Every day | ORAL | 1 refills | Status: DC

## 2016-05-20 MED ORDER — METOPROLOL SUCCINATE ER 100 MG PO TB24: 100.0000 mg | ORAL_TABLET | Freq: Every day | ORAL | 1 refills | Status: DC

## 2016-05-20 NOTE — Progress Notes (Signed)
Subjective:    Patient ID: Teresa Holloway, female    DOB: 1940-08-29, 76 y.o.   MRN: [id number]  Patient here today for follow up of chronic medical problems.NO changes since last visit.  Outpatient Encounter Prescriptions as of 05/20/2016  Medication Sig  . amLODipine (NORVASC) 10 MG tablet Take 1 tablet (10 mg total) by mouth daily.  Marland Kitchen aspirin 81 MG tablet Take 81 mg by mouth daily.  Marland Kitchen atorvastatin (LIPITOR) 40 MG tablet TAKE 1 TABLET (40 MG TOTAL) BY MOUTH DAILY AT 6 PM.  . benazepril (LOTENSIN) 40 MG tablet Take 1 tablet (40 mg total) by mouth daily.  . cholecalciferol (VITAMIN D) 1000 UNITS tablet Take 1,000 Units by mouth daily.  . clopidogrel (PLAVIX) 75 MG tablet Take 1 tablet (75 mg total) by mouth daily.  . [facility] RANITIDINE 75 MG tablet TAKE 1 TABLET (75 MG TOTAL) BY MOUTH DAILY.  . hydrochlorothiazide (HYDRODIURIL) 25 MG tablet TAKE 1 TABLET (25 MG TOTAL) BY MOUTH DAILY.  Marland Kitchen HYDROcodone-acetaminophen (NORCO) 5-325 MG tablet Take 1 tablet by mouth every 6 (six) hours as needed for moderate pain.  Marland Kitchen HYDROcodone-acetaminophen (NORCO/VICODIN) 5-325 MG tablet TAKE 1 TABLET BY MOUTH EVERY 6 HOURS AS NEEDED FOR PAIN  . HYDROcodone-acetaminophen (NORCO/VICODIN) 5-325 MG tablet Take 1 tablet by mouth every 6 (six) hours as needed for moderate pain.  . isosorbide mononitrate (IMDUR) 60 MG 24 hr tablet TAKE 1 TABLET (60 MG TOTAL) BY MOUTH DAILY.  Marland Kitchen latanoprost (XALATAN) 0.005 % ophthalmic solution Place 1 drop into both eyes at bedtime.  Marland Kitchen LUMIGAN 0.01 % SOLN USE 1 DROP AT BEDTIME INTO BOTH EYES  . metoprolol succinate (TOPROL-XL) 100 MG 24 hr tablet Take 1 tablet (100 mg total) by mouth daily.  Marland Kitchen NITROSTAT 0.4 MG SL tablet Place 0.4 mg under the tongue every 5 (five) minutes as needed for chest pain.   Marland Kitchen PARoxetine (PAXIL) 40 MG tablet Take 1 tablet (40 mg total) by mouth daily.   No facility-administered encounter medications on file as of 05/20/2016.       Hypertension  This is a  chronic problem. The current episode started more than 1 year ago. The problem is controlled. Pertinent negatives include no chest pain, headaches, neck pain or shortness of breath. Risk factors for coronary artery disease include dyslipidemia and post-menopausal state. Past treatments include ACE inhibitors and diuretics. The current treatment provides moderate improvement. Compliance problems include diet and exercise.   Hyperlipidemia  This is a chronic problem. The current episode started more than 1 year ago. The problem is uncontrolled. Recent lipid tests were reviewed and are high. She has no history of diabetes or hypothyroidism. Pertinent negatives include no chest pain or shortness of breath. Current antihyperlipidemic treatment includes statins. The current treatment provides moderate improvement of lipids. Compliance problems include adherence to diet and adherence to exercise.  Risk factors for coronary artery disease include dyslipidemia and hypertension.  CAD/MI Currently on Imdurr - no C/O chest pain or SOB or Dyspnea- Sees Dr. Dorena Cookey sees yearly and her next appointment is April next month. Depression Paxil daily  Which is working well for her- no c/o side effects Vitamin D deficiency  Vitamin D OTC- No side effects Osteoarthritis bil knees and hips- takes hydrocodone BID and says she can't walk without it due to the pain, she is considering surgery as a remedy. Takes norco daily for pain.Ortho is setting her up for physical therapy Pain assessment: Cause of pain-  Osteo arthritis Pain  location- knees and hips Pain on scale of 1-10-  7-8/10 Frequency- daily What increases pain-walking and standing What makes pain Better-resting Effects on ADL - just takes her longer to do ADL's then use to Any change in general medical condition-none  Current medications- norco Effectiveness of current meds-drops pain to 3-4/10 Adverse reactions form pain meds- none  Pill count  performed-No Urine drug screen- No GERD Zantac working well to keep symptoms under control. Osteoporosis Patient had  Bone density test in March of this year. Currently on  No meds.   Review of Systems  Constitutional: Negative.   HENT: Negative.   Respiratory: Negative for shortness of breath.   Cardiovascular: Negative for chest pain.  Genitourinary: Positive for urgency. Negative for dysuria and frequency.  Musculoskeletal: Negative for neck pain.  Neurological: Negative for headaches.  Psychiatric/Behavioral: Negative.   All other systems reviewed and are negative.      Objective:   Physical Exam  Constitutional: She is oriented to person, place, and time. She appears well-developed and well-nourished.  HENT:  Nose: Nose normal.  Mouth/Throat: Oropharynx is clear and moist.  Eyes: EOM are normal.  Neck: Trachea normal, normal range of motion and full passive range of motion without pain. Neck supple. No JVD present. Carotid bruit is not present. No thyromegaly present.  Cardiovascular: Normal rate, regular rhythm, normal heart sounds and intact distal pulses.  Exam reveals no gallop and no friction rub.   No murmur heard. Pulmonary/Chest: Effort normal and breath sounds normal.  Abdominal: Soft. Bowel sounds are normal. She exhibits no distension and no mass. There is no tenderness.  Musculoskeletal: Normal range of motion.  Lymphadenopathy:    She has no cervical adenopathy.  Neurological: She is alert and oriented to person, place, and time. She has normal reflexes.  Skin: Skin is warm and dry.  2cm raised erythematous hard skin lesion right shin   Psychiatric: She has a normal mood and affect. Her behavior is normal. Judgment and thought content normal.   BP 112/65   Pulse (!) 56   Temp 98.4 F (36.9 C) (Oral)   Ht 5\' 4"  (1.626 m)   Wt 171 lb (77.6 kg)   BMI 29.35 kg/m      Assessment & Plan:  1. Coronary artery disease involving native coronary artery of  native heart with angina pectoris with documented spasm (HCC) - clopidogrel (PLAVIX) 75 MG tablet; Take 1 tablet (75 mg total) by mouth daily.  Dispense: 90 tablet; Refill: 1 - metoprolol succinate (TOPROL-XL) 100 MG 24 hr tablet; Take 1 tablet (100 mg total) by mouth daily.  Dispense: 90 tablet; Refill: 1  2. Essential hypertension, benign Low sodium diet - benazepril (LOTENSIN) 40 MG tablet; Take 1 tablet (40 mg total) by mouth daily.  Dispense: 90 tablet; Refill: 1 - amLODipine (NORVASC) 10 MG tablet; Take 1 tablet (10 mg total) by mouth daily.  Dispense: 90 tablet; Refill: 1 - hydrochlorothiazide (HYDRODIURIL) 25 MG tablet; TAKE 1 TABLET (25 MG TOTAL) BY MOUTH DAILY.  Dispense: 90 tablet; Refill: 1 - isosorbide mononitrate (IMDUR) 60 MG 24 hr tablet; TAKE 1 TABLET (60 MG TOTAL) BY MOUTH DAILY.  Dispense: 90 tablet; Refill: 1  3. Gastroesophageal reflux disease without esophagitis Avoid spicy foods Do not eat 2 hours prior to bedtime  4. BMI 30.0-30.9,adult Discussed diet and exercise for person with BMI >25 Will recheck weight in 3-6 months  5. Recurrent major depressive disorder, in full remission (HCC) Stress management - PARoxetine (  PAXIL) 40 MG tablet; Take 1 tablet (40 mg total) by mouth daily.  Dispense: 90 tablet; Refill: 1  6. Hyperlipidemia with target LDL less than 100 Low fat diet - atorvastatin (LIPITOR) 40 MG tablet; Take 1 tablet (40 mg total) by mouth daily at 6 PM.  Dispense: 90 tablet; Refill: 0  7. Opioid type dependence, continuous (HCC)  8. Osteoarthritis of left hip, unspecified osteoarthritis type - HYDROcodone-acetaminophen (NORCO/VICODIN) 5-325 MG tablet; Take 1 tablet by mouth every 6 (six) hours as needed for moderate pain.  Dispense: 60 tablet; Refill: 0 - HYDROcodone-acetaminophen (NORCO) 5-325 MG tablet; Take 1 tablet by mouth every 6 (six) hours as needed for moderate pain.  Dispense: 60 tablet; Refill: 0 - HYDROcodone-acetaminophen (NORCO/VICODIN)  5-325 MG tablet; TAKE 1 TABLET BY MOUTH EVERY 6 HOURS AS NEEDED FOR PAIN  Dispense: 60 tablet; Refill: 0  9. Skin lesion of right lower limb Patient will make appointment for skin lesion removal    Labs pending Health maintenance reviewed Diet and exercise encouraged Continue all meds Follow up  In 3 months    Mary-Margaret Daphine Deutscher, FNP

## 2016-05-20 NOTE — Patient Instructions (Signed)
Stress and Stress Management Stress is a normal reaction to life events. It is what you feel when life demands more than you are used to or more than you can handle. Some stress can be useful. For example, the stress reaction can help you catch the last bus of the day, study for a test, or meet a deadline at work. But stress that occurs too often or for too long can cause problems. It can affect your emotional health and interfere with relationships and normal daily activities. Too much stress can weaken your immune system and increase your risk for physical illness. If you already have a medical problem, stress can make it worse. What are the causes? All sorts of life events may cause stress. An event that causes stress for one person may not be stressful for another person. Major life events commonly cause stress. These may be positive or negative. Examples include losing your job, moving into a new home, getting married, having a baby, or losing a loved one. Less obvious life events may also cause stress, especially if they occur day after day or in combination. Examples include working long hours, driving in traffic, caring for children, being in debt, or being in a difficult relationship. What are the signs or symptoms? Stress may cause emotional symptoms including, the following:  Anxiety. This is feeling worried, afraid, on edge, overwhelmed, or out of control.  Anger. This is feeling irritated or impatient.  Depression. This is feeling sad, down, helpless, or guilty.  Difficulty focusing, remembering, or making decisions. Stress may cause physical symptoms, including the following:  Aches and pains. These may affect your head, neck, back, stomach, or other areas of your body.  Tight muscles or clenched jaw.  Low energy or trouble sleeping. Stress may cause unhealthy behaviors, including the following:  Eating to feel better (overeating) or skipping meals.  Sleeping too little, too  much, or both.  Working too much or putting off tasks (procrastination).  Smoking, drinking alcohol, or using drugs to feel better. How is this diagnosed? Stress is diagnosed through an assessment by your health care provider. Your health care provider will ask questions about your symptoms and any stressful life events.Your health care provider will also ask about your medical history and may order blood tests or other tests. Certain medical conditions and medicine can cause physical symptoms similar to stress. Mental illness can cause emotional symptoms and unhealthy behaviors similar to stress. Your health care provider may refer you to a mental health professional for further evaluation. How is this treated? Stress management is the recommended treatment for stress.The goals of stress management are reducing stressful life events and coping with stress in healthy ways. Techniques for reducing stressful life events include the following:  Stress identification. Self-monitor for stress and identify what causes stress for you. These skills may help you to avoid some stressful events.  Time management. Set your priorities, keep a calendar of events, and learn to say "no." These tools can help you avoid making too many commitments. Techniques for coping with stress include the following:  Rethinking the problem. Try to think realistically about stressful events rather than ignoring them or overreacting. Try to find the positives in a stressful situation rather than focusing on the negatives.  Exercise. Physical exercise can release both physical and emotional tension. The key is to find a form of exercise you enjoy and do it regularly.  Relaxation techniques. These relax the body and mind. Examples include yoga,  meditation, tai chi, biofeedback, deep breathing, progressive muscle relaxation, listening to music, being out in nature, journaling, and other hobbies. Again, the key is to find one or  more that you enjoy and can do regularly.  Healthy lifestyle. Eat a balanced diet, get plenty of sleep, and do not smoke. Avoid using alcohol or drugs to relax.  Strong support network. Spend time with family, friends, or other people you enjoy being around.Express your feelings and talk things over with someone you trust. Counseling or talktherapy with a mental health professional may be helpful if you are having difficulty managing stress on your own. Medicine is typically not recommended for the treatment of stress.Talk to your health care provider if you think you need medicine for symptoms of stress. Follow these instructions at home:  Keep all follow-up visits as directed by your health care provider.  Take all medicines as directed by your health care provider. Contact a health care provider if:  Your symptoms get worse or you start having new symptoms.  You feel overwhelmed by your problems and can no longer manage them on your own. Get help right away if:  You feel like hurting yourself or someone else. This information is not intended to replace advice given to you by your health care provider. Make sure you discuss any questions you have with your health care provider. Document Released: 10/02/2000 Document Revised: 09/14/2015 Document Reviewed: 12/01/2012 Elsevier Interactive Patient Education  2017 [employer].

## 2016-05-20 NOTE — Addendum Note (Signed)
Addended by: Chevis Pretty on: 05/20/2016 01:55 PM   Modules accepted: Orders

## 2016-05-21 LAB — CMP14+EGFR
ALBUMIN: 4.2 g/dL (ref 3.5–4.8)
ALT: 8 IU/L (ref 0–32)
AST: 10 IU/L (ref 0–40)
Albumin/Globulin Ratio: 2.2 (ref 1.2–2.2)
Alkaline Phosphatase: 78 IU/L (ref 39–117)
BUN / CREAT RATIO: 22 (ref 12–28)
BUN: 16 mg/dL (ref 8–27)
Bilirubin Total: 0.4 mg/dL (ref 0.0–1.2)
CALCIUM: 9.5 mg/dL (ref 8.7–10.3)
CO2: 27 mmol/L (ref 18–29)
CREATININE: 0.73 mg/dL (ref 0.57–1.00)
Chloride: 97 mmol/L (ref 96–106)
GFR calc Af Amer: 94 mL/min/{1.73_m2} (ref >59)
GFR, EST NON AFRICAN AMERICAN: 81 mL/min/{1.73_m2} (ref >59)
GLOBULIN, TOTAL: 1.9 g/dL (ref 1.5–4.5)
Glucose: 103 mg/dL — ABNORMAL HIGH (ref 65–99)
POTASSIUM: 3.6 mmol/L (ref 3.5–5.2)
SODIUM: 141 mmol/L (ref 134–144)
Total Protein: 6.1 g/dL (ref 6.0–8.5)

## 2016-05-21 LAB — LIPID PANEL
CHOL/HDL RATIO: 1.9 ratio (ref 0.0–4.4)
Cholesterol, Total: 129 mg/dL (ref 100–199)
HDL: 68 mg/dL (ref >39)
LDL CALC: 44 mg/dL (ref 0–99)
TRIGLYCERIDES: 85 mg/dL (ref 0–149)
VLDL Cholesterol Cal: 17 mg/dL (ref 5–40)

## 2016-05-30 ENCOUNTER — Encounter: Payer: Self-pay | Admitting: Nurse Practitioner

## 2016-05-30 ENCOUNTER — Ambulatory Visit (INDEPENDENT_AMBULATORY_CARE_PROVIDER_SITE_OTHER): Payer: Medicare Other | Admitting: Nurse Practitioner

## 2016-05-30 VITALS — BP 112/64 | HR 56 | Temp 97.2°F | Ht 64.0 in | Wt 171.0 lb

## 2016-05-30 DIAGNOSIS — L989 Disorder of the skin and subcutaneous tissue, unspecified: Secondary | ICD-10-CM

## 2016-05-30 DIAGNOSIS — I25111 Atherosclerotic heart disease of native coronary artery with angina pectoris with documented spasm: Secondary | ICD-10-CM

## 2016-05-30 DIAGNOSIS — C44729 Squamous cell carcinoma of skin of left lower limb, including hip: Secondary | ICD-10-CM | POA: Diagnosis not present

## 2016-05-30 NOTE — Patient Instructions (Signed)
Skin Biopsy, Care After Refer to this sheet in the next few weeks. These instructions provide you with information about caring for yourself after your procedure. Your health care provider may also give you more specific instructions. Your treatment has been planned according to current medical practices, but problems sometimes occur. Call your health care provider if you have any problems or questions after your procedure. What can I expect after the procedure? After the procedure, it is common to have:  Soreness.  Bruising.  Itching. Follow these instructions at home:  Rest and then return to your normal activities as told by your health care provider.  Take over-the-counter and prescription medicines only as told by your health care provider.  Follow instructions from your health care provider about how to take care of your biopsy site.Make sure you:  Wash your hands with soap and water before you change your bandage (dressing). If soap and water are not available, use hand sanitizer.  Change your dressing as told by your health care provider.  Leave stitches (sutures), skin glue, or adhesive strips in place. These skin closures may need to stay in place for 2 weeks or longer. If adhesive strip edges start to loosen and curl up, you may trim the loose edges. Do not remove adhesive strips completely unless your health care provider tells you to do that. If the biopsy area bleeds, apply gentle pressure for 10 minutes.  Check your biopsy site every day for signs of infection. Check for:  More redness, swelling, or pain.  More fluid or blood.  Warmth.  Pus or a bad smell.  Keep all follow-up visits as told by your health care provider. This is important. Contact a health care provider if:  You have more redness, swelling, or pain around your biopsy site.  You have more fluid or blood coming from your biopsy site.  Your biopsy site feels warm to the touch.  You have pus or a  bad smell coming from your biopsy site.  You have a fever. Get help right away if:  You have bleeding that does not stop with pressure or a dressing. This information is not intended to replace advice given to you by your health care provider. Make sure you discuss any questions you have with your health care provider. Document Released: 05/05/2015 Document Revised: 12/03/2015 Document Reviewed: 07/06/2014 Elsevier Interactive Patient Education  2017 [employer]  

## 2016-05-30 NOTE — Progress Notes (Signed)
Subjective:    Patient ID: [name], female    DOB: 23-Jan-1941, 76 y.o.   MRN: [id number]  HPI Patient comes in today to remove skin lesion from left shin    Review of Systems  Constitutional: Negative.   HENT: Negative.   Respiratory: Negative.   Cardiovascular: Negative.   Genitourinary: Negative.   Neurological: Negative.   Psychiatric/Behavioral: Negative.   All other systems reviewed and are negative.      Objective:   Physical Exam  Constitutional: She appears well-developed and well-nourished. No distress.  Cardiovascular: Normal rate.   Pulmonary/Chest: Effort normal.  Skin: Skin is warm.  2cm raised annular flesh colored skin lesion left shin    Procedure: Lidocaine 2% with epi 3cc local  Cleaned with betadine  Punch remval of sin lesion  2 horizontal mattress stitches  cleaned with betadine  Antibiotic ointment and dressing applied      Assessment & Plan:  Encounter for removal of skin lesion - Plan: Pathology  Keep wound clean and dry Watch for signs of infection ARTO in 10 days for stitch removal  Mary-Margaret Daphine Deutscher, FNP

## 2016-06-04 LAB — PATHOLOGY

## 2016-06-05 ENCOUNTER — Telehealth: Payer: Self-pay | Admitting: Pediatrics

## 2016-06-10 ENCOUNTER — Ambulatory Visit (INDEPENDENT_AMBULATORY_CARE_PROVIDER_SITE_OTHER): Payer: Medicare Other | Admitting: Nurse Practitioner

## 2016-06-10 ENCOUNTER — Encounter: Payer: Self-pay | Admitting: Nurse Practitioner

## 2016-06-10 VITALS — BP 107/67 | HR 56 | Temp 98.3°F | Ht 64.0 in | Wt 171.0 lb

## 2016-06-10 DIAGNOSIS — Z4802 Encounter for removal of sutures: Secondary | ICD-10-CM

## 2016-06-10 NOTE — Patient Instructions (Signed)
Squamous Cell Carcinoma  Squamous cell carcinoma is a common form of skin cancer. It begins in the squamous cells in the outer layer of the skin (epidermis). It occurs most often in parts of the body that are frequently exposed to the sun, such as the face, lips, neck, arms, legs, and hands. However, this condition can occur anywhere on the body, including the inside of the mouth, sites of long-term (chronic) scarring, and the anus.  If squamous cell carcinoma is treated soon enough, it rarely spreads to other areas of the body (metastasizes). If it is not treated, it can destroy nearby tissues. In rare cases, it can spread to other areas.  What are the causes?  This condition is usually caused by exposure to ultraviolet (UV) light. UV light may come from the sun or from tanning beds. Other causes include:   Exposure to arsenic.   Exposure to radiation.   Exposure to toxic tars and oils.   Certain genetic conditions, such as xeroderma pigmentosum.  What increases the risk?  This condition is more likely to develop in:   People who are older than 75 years of age.   People who have fair skin (light complexion).   People who have blonde or red hair.   People who have blue, green, or gray eyes.   People who have childhood freckling.   People who have had sun exposure over long periods of time, especially during childhood.   People who have had repeated sunburns.   People who have a weakened immune system.   People who have been exposed to certain chemicals, such as tar, soot, and arsenic.   People who have chronic inflammatory conditions.   People who have chronic infections.   People who have an HPV (human papillomavirus) infection.   People who have conditions that cause chronic scarring. These can include burn scars, chronic ulcers, heat (thermal) injuries, and radiation.   People who have had psoralen and ultraviolet A (PUVA) treatments.   People who smoke.   People who use tanning beds.  What  are the signs or symptoms?  This condition often starts as small pink or brown growths on the skin. The growths have a rough surface that may feel like sandpaper. In some cases, the growths are easier to feel than to see. The growths may develop into a sore that does not heal.  How is this diagnosed?  This condition may be diagnosed with:   A physical exam.   Removal of a tissue sample to be examined under a microscope (biopsy).  How is this treated?  Treatment for this condition involves removing the cancerous tissue. The method that is used for this depends on the size and location of the tumors, as well as your overall health. Possible treatments include:   Mohs surgery. In this procedure, the cancerous skin cells are removed layer by layer until all of the tumor has been removed.   Surgical removal (excision) of the tumor. This involves removing the entire tumor and a small amount of normal skin that surrounds it.   Cryosurgery. This involves freezing the tumor with liquid nitrogen.   Plastic surgery. The tumor is removed, and healthy skin from another part of the body is used to cover the wound. This may be done for large tumors that are in areas where it is not possible to stretch the nearby skin to sew the edges of the wound together.   Radiation. This may be used for tumors   current to control bleeding. Follow these instructions at home:  Avoid unprotected sun exposure.  Do self-exams as told by your health care provider. Look for new spots or changes in your skin.  Keep all follow-up visits as told by your health care provider. This is important.  Do not use tobacco products, including cigarettes, chewing tobacco, or e-cigarettes. If you need help  quitting, ask your health care provider. How is this prevented?  Avoid the sun when it is the strongest. This is usually between 10:00 a.m. and 4:00 p.m.  When you are out in the sun, use a sunscreen that has a sun protection factor (SPF) of at least 17.  Apply sunscreen at least 30 minutes before exposure to the sun.  Reapply sunscreen every 2-4 hours while you are outside. Also reapply it after swimming and after excessive sweating.  Always wear hats, protective clothing, and UV-blocking sunglasses when you are outdoors.  Do not use tanning beds. Contact a health care provider if:  You notice any new growths or any changes in your skin.  You have had a squamous cell carcinoma tumor removed and you notice a new growth in the same location. This information is not intended to replace advice given to you by your health care provider. Make sure you discuss any questions you have with your health care provider. Document Released: 10/13/2002 Document Revised: 12/05/2015 Document Reviewed: 08/01/2014 Elsevier Interactive Patient Education  2017 [employer].

## 2016-06-10 NOTE — Progress Notes (Signed)
Subjective:    Patient ID: Teresa Holloway, female    DOB: [birth year], 76 y.o.   MRN: [id number]  HPI Patient was seen on 05/30/16 to have skin lesion removed from left shin. She is here today for stitch removal. Pathology report showed squamous cell carcinoma but had clear margins.    Review of Systems  All other systems reviewed and are negative.      Objective:   Physical Exam  Skin:  Wound edges to left shin well approximated- no erythem or edema- no drianage- 2 sutures removed-cleaned and bandaid applied          Assessment & Plan:  1. Visit for suture removal Keep clean and dry Watch for signs of infection until completely healed Discussed pathology report with patient Follow up prn  Mary-Margaret Daphine Deutscher, FNP

## 2016-08-22 ENCOUNTER — Ambulatory Visit (INDEPENDENT_AMBULATORY_CARE_PROVIDER_SITE_OTHER): Payer: Medicare Other | Admitting: Nurse Practitioner

## 2016-08-22 ENCOUNTER — Encounter: Payer: Self-pay | Admitting: Nurse Practitioner

## 2016-08-22 VITALS — BP 95/57 | HR 58 | Temp 97.7°F | Ht 64.0 in | Wt 162.0 lb

## 2016-08-22 DIAGNOSIS — Z6830 Body mass index (BMI) 30.0-30.9, adult: Secondary | ICD-10-CM | POA: Diagnosis not present

## 2016-08-22 DIAGNOSIS — I10 Essential (primary) hypertension: Secondary | ICD-10-CM

## 2016-08-22 DIAGNOSIS — K219 Gastro-esophageal reflux disease without esophagitis: Secondary | ICD-10-CM

## 2016-08-22 DIAGNOSIS — F1120 Opioid dependence, uncomplicated: Secondary | ICD-10-CM

## 2016-08-22 DIAGNOSIS — E785 Hyperlipidemia, unspecified: Secondary | ICD-10-CM

## 2016-08-22 DIAGNOSIS — Z1231 Encounter for screening mammogram for malignant neoplasm of breast: Secondary | ICD-10-CM | POA: Diagnosis not present

## 2016-08-22 DIAGNOSIS — M810 Age-related osteoporosis without current pathological fracture: Secondary | ICD-10-CM

## 2016-08-22 DIAGNOSIS — M1612 Unilateral primary osteoarthritis, left hip: Secondary | ICD-10-CM | POA: Diagnosis not present

## 2016-08-22 DIAGNOSIS — F3342 Major depressive disorder, recurrent, in full remission: Secondary | ICD-10-CM | POA: Diagnosis not present

## 2016-08-22 DIAGNOSIS — Z0289 Encounter for other administrative examinations: Secondary | ICD-10-CM

## 2016-08-22 DIAGNOSIS — I25111 Atherosclerotic heart disease of native coronary artery with angina pectoris with documented spasm: Secondary | ICD-10-CM | POA: Diagnosis not present

## 2016-08-22 MED ORDER — HYDROCODONE-ACETAMINOPHEN 5-325 MG PO TABS: ORAL_TABLET | ORAL | 0 refills | Status: DC

## 2016-08-22 MED ORDER — ATORVASTATIN CALCIUM 40 MG PO TABS: 40.0000 mg | ORAL_TABLET | Freq: Every day | ORAL | 0 refills | Status: DC

## 2016-08-22 MED ORDER — PAROXETINE HCL 40 MG PO TABS: 40.0000 mg | ORAL_TABLET | Freq: Every day | ORAL | 1 refills | Status: DC

## 2016-08-22 MED ORDER — HYDROCODONE-ACETAMINOPHEN 5-325 MG PO TABS: 1.0000 | ORAL_TABLET | Freq: Four times a day (QID) | ORAL | 0 refills | Status: DC | PRN

## 2016-08-22 NOTE — Progress Notes (Signed)
Subjective:    Patient ID: Teresa Holloway, female    DOB: [birth year], 76 y.o.   MRN: [id number]  HPI   Teresa Holloway is here today for follow up of chronic medical problem.  Outpatient Encounter Prescriptions as of 08/22/2016  Medication Sig  . amLODipine (NORVASC) 10 MG tablet Take 1 tablet (10 mg total) by mouth daily.  Marland Kitchen aspirin 81 MG tablet Take 81 mg by mouth daily.  Marland Kitchen atorvastatin (LIPITOR) 40 MG tablet Take 1 tablet (40 mg total) by mouth daily at 6 PM.  . benazepril (LOTENSIN) 40 MG tablet Take 1 tablet (40 mg total) by mouth daily.  . cholecalciferol (VITAMIN D) 1000 UNITS tablet Take 1,000 Units by mouth daily.  . clopidogrel (PLAVIX) 75 MG tablet Take 1 tablet (75 mg total) by mouth daily.  . [facility] RANITIDINE 75 MG tablet TAKE 1 TABLET (75 MG TOTAL) BY MOUTH DAILY.  . hydrochlorothiazide (HYDRODIURIL) 25 MG tablet TAKE 1 TABLET (25 MG TOTAL) BY MOUTH DAILY.  Marland Kitchen HYDROcodone-acetaminophen (NORCO) 5-325 MG tablet Take 1 tablet by mouth every 6 (six) hours as needed for moderate pain.  Marland Kitchen HYDROcodone-acetaminophen (NORCO/VICODIN) 5-325 MG tablet Take 1 tablet by mouth every 6 (six) hours as needed for moderate pain.  Marland Kitchen HYDROcodone-acetaminophen (NORCO/VICODIN) 5-325 MG tablet TAKE 1 TABLET BY MOUTH EVERY 6 HOURS AS NEEDED FOR PAIN  . isosorbide mononitrate (IMDUR) 60 MG 24 hr tablet TAKE 1 TABLET (60 MG TOTAL) BY MOUTH DAILY.  Marland Kitchen latanoprost (XALATAN) 0.005 % ophthalmic solution Place 1 drop into both eyes at bedtime.  Marland Kitchen LUMIGAN 0.01 % SOLN USE 1 DROP AT BEDTIME INTO BOTH EYES  . metoprolol succinate (TOPROL-XL) 100 MG 24 hr tablet Take 1 tablet (100 mg total) by mouth daily.  Marland Kitchen NITROSTAT 0.4 MG SL tablet Place 0.4 mg under the tongue every 5 (five) minutes as needed for chest pain.   Marland Kitchen PARoxetine (PAXIL) 40 MG tablet Take 1 tablet (40 mg total) by mouth daily.   No facility-administered encounter medications on file as of 08/22/2016.     1. Coronary artery disease involving  native coronary artery of native heart with angina pectoris with documented spasm [facility])   sees cardioloist 1x a year- no problems that she is awareof  2. Essential hypertension, benign  No c/o chest pain,SOB or HA- does not check blood pressure at home  3. Gastroesophageal reflux disease without esophagitis   4. Age related osteoporosis, unspecified pathological fracture presence Does not do much exercising due to hip and knee pain   5. BMI 30.0-30.9,adult   no recent weight gain or weight loss  6. Recurrent major depressive disorder, in full remission [facility])   is on paxil and has been for several years and has no sie effects Depression screen [facility] 2/9 06/10/2016 05/30/2016 05/20/2016 02/22/2016 02/15/2016  Decreased Interest 0 0 0 0 0  Down, Depressed, Hopeless 0 0 0 0 0  PHQ - 2 Score 0 0 0 0 0     7. Hyperlipidemia with target LDL less than 100  Does try to eat low fat diet  8. Opioid type dependence, continuous (HCC)  Takes pain meds daily- cannot get around well without it  9. Pain management contract agreement   10. Primary osteoarthritis of left hip  Hip hurts al the time Pain assessment: Cause of pain- osteo arthritis Pain location- hips and knees Pain on scale of 1-10- 5/10 Frequency- daily What increases pain-lots of walking or standing What makes pain  Better-pain meds help Effects on ADL - is able to get ADL's done Any change in general medical condition-none  Current medications- norco 5/325 every 6 hours Effectiveness of current meds-does ot completely relieve pain Adverse reactions form pain meds-none Morphine equivalent>20  Pill count performed-No Urine drug screen- No Was the [facility] reviewed- yes  If yes were their any concerning findings? - no suspicians     New complaints: None today    Review of Systems  Constitutional: Negative for diaphoresis.  Eyes: Negative for pain.  Respiratory: Negative for shortness of breath.   Cardiovascular: Negative for  chest pain, palpitations and leg swelling.  Gastrointestinal: Negative for abdominal pain.  Endocrine: Negative for polydipsia.  Skin: Negative for rash.  Neurological: Negative for dizziness, weakness and headaches.  Hematological: Does not bruise/bleed easily.       Objective:   Physical Exam  Constitutional: She is oriented to person, place, and time. She appears well-developed and well-nourished.  HENT:  Nose: Nose normal.  Mouth/Throat: Oropharynx is clear and moist.  Eyes: EOM are normal.  Neck: Trachea normal, normal range of motion and full passive range of motion without pain. Neck supple. No JVD present. Carotid bruit is not present. No thyromegaly present.  Cardiovascular: Normal rate, regular rhythm, normal heart sounds and intact distal pulses.  Exam reveals no gallop and no friction rub.   No murmur heard. Pulmonary/Chest: Effort normal and breath sounds normal.  Abdominal: Soft. Bowel sounds are normal. She exhibits no distension and no mass. There is no tenderness.  Musculoskeletal: Normal range of motion.  Lymphadenopathy:    She has no cervical adenopathy.  Neurological: She is alert and oriented to person, place, and time. She has normal reflexes.  Skin: Skin is warm and dry.  Psychiatric: She has a normal mood and affect. Her behavior is normal. Judgment and thought content normal.    BP (!) 95/57   Pulse (!) 58   Temp 97.7 F (36.5 C) (Oral)   Ht 5\' 4"  (1.626 m)   Wt 162 lb (73.5 kg)   BMI 27.81 kg/m       Assessment & Plan:  1. Coronary artery disease involving native coronary artery of native heart with angina pectoris with documented spasm (HCC) Low fat diet.  Follow prescribed medication regimen.  2. Essential hypertension, benign Low salt diet. - CMP14+EGFR  3. Gastroesophageal reflux disease without esophagitis Avoid spicy foods Do not eat 2 hours prior to bedtime   4. Age related osteoporosis, unspecified pathological fracture  presence Calcium supplement and weight-bearing exercise.  5. BMI 30.0-30.9,adult Discussed diet and exercise for person with BMI >25 Will recheck weight in 3-6 months   6. Recurrent major depressive disorder, in full remission (HCC) - PARoxetine (PAXIL) 40 MG tablet; Take 1 tablet (40 mg total) by mouth daily.  Dispense: 90 tablet; Refill: 1  7. Hyperlipidemia with target LDL less than 100 Low fat diet. - atorvastatin (LIPITOR) 40 MG tablet; Take 1 tablet (40 mg total) by mouth daily at 6 PM.  Dispense: 90 tablet; Refill: 0 - Lipid panel  8. Opioid type dependence, continuous (HCC) Patient pain management plan discussed.    9. Pain management contract agreement Discussed with patient.  10. Primary osteoarthritis of left hip   11. Osteoarthritis of left hip, unspecified osteoarthritis type - HYDROcodone-acetaminophen (NORCO/VICODIN) 5-325 MG tablet; Take 1 tablet by mouth every 6 (six) hours as needed for moderate pain.  Dispense: 60 tablet; Refill: 0 - HYDROcodone-acetaminophen (NORCO) 5-325 MG  tablet; Take 1 tablet by mouth every 6 (six) hours as needed for moderate pain.  Dispense: 60 tablet; Refill: 0 - HYDROcodone-acetaminophen (NORCO/VICODIN) 5-325 MG tablet; TAKE 1 TABLET BY MOUTH EVERY 6 HOURS AS NEEDED FOR PAIN  Dispense: 60 tablet; Refill: 0  12. 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 - MM Digital Screening; Future    Labs pending Health maintenance reviewed Diet and exercise encouraged Continue all meds Follow up  In 3 months.    Marissa Hite, SNP Mary-Margaret Daphine Deutscher, FNP

## 2016-08-22 NOTE — Patient Instructions (Addendum)

## 2016-08-22 NOTE — Progress Notes (Signed)
Subjective:    Patient ID: Teresa Holloway, female    DOB: 1940-08-20, 76 y.o.   MRN: [id number]  HPI    Review of Systems     Objective:   Physical Exam        Assessment & Plan:

## 2016-08-23 LAB — CMP14+EGFR
A/G RATIO: 2.2 (ref 1.2–2.2)
ALBUMIN: 4.2 g/dL (ref 3.5–4.8)
ALT: 9 IU/L (ref 0–32)
AST: 11 IU/L (ref 0–40)
Alkaline Phosphatase: 85 IU/L (ref 39–117)
BILIRUBIN TOTAL: 0.4 mg/dL (ref 0.0–1.2)
BUN/Creatinine Ratio: 23 (ref 12–28)
BUN: 20 mg/dL (ref 8–27)
CHLORIDE: 98 mmol/L (ref 96–106)
CO2: 30 mmol/L — ABNORMAL HIGH (ref 18–29)
Calcium: 9.7 mg/dL (ref 8.7–10.3)
Creatinine, Ser: 0.88 mg/dL (ref 0.57–1.00)
GFR calc non Af Amer: 65 mL/min/{1.73_m2} (ref >59)
GFR, EST AFRICAN AMERICAN: 75 mL/min/{1.73_m2} (ref >59)
GLOBULIN, TOTAL: 1.9 g/dL (ref 1.5–4.5)
GLUCOSE: 91 mg/dL (ref 65–99)
Potassium: 3.9 mmol/L (ref 3.5–5.2)
SODIUM: 141 mmol/L (ref 134–144)
TOTAL PROTEIN: 6.1 g/dL (ref 6.0–8.5)

## 2016-08-23 LAB — LIPID PANEL
Chol/HDL Ratio: 2.1 ratio (ref 0.0–4.4)
Cholesterol, Total: 124 mg/dL (ref 100–199)
HDL: 58 mg/dL (ref >39)
LDL Calculated: 46 mg/dL (ref 0–99)
Triglycerides: 99 mg/dL (ref 0–149)
VLDL CHOLESTEROL CAL: 20 mg/dL (ref 5–40)

## 2016-09-11 DIAGNOSIS — R001 Bradycardia, unspecified: Secondary | ICD-10-CM | POA: Diagnosis not present

## 2016-09-11 DIAGNOSIS — Z79899 Other long term (current) drug therapy: Secondary | ICD-10-CM | POA: Diagnosis not present

## 2016-09-11 DIAGNOSIS — I119 Hypertensive heart disease without heart failure: Secondary | ICD-10-CM | POA: Diagnosis not present

## 2016-09-11 DIAGNOSIS — Z7982 Long term (current) use of aspirin: Secondary | ICD-10-CM | POA: Diagnosis not present

## 2016-09-11 DIAGNOSIS — I252 Old myocardial infarction: Secondary | ICD-10-CM | POA: Diagnosis not present

## 2016-09-11 DIAGNOSIS — I493 Ventricular premature depolarization: Secondary | ICD-10-CM | POA: Diagnosis not present

## 2016-09-11 DIAGNOSIS — I214 Non-ST elevation (NSTEMI) myocardial infarction: Secondary | ICD-10-CM | POA: Diagnosis not present

## 2016-09-11 DIAGNOSIS — I25118 Atherosclerotic heart disease of native coronary artery with other forms of angina pectoris: Secondary | ICD-10-CM | POA: Diagnosis not present

## 2016-09-11 DIAGNOSIS — I10 Essential (primary) hypertension: Secondary | ICD-10-CM | POA: Diagnosis not present

## 2016-09-11 DIAGNOSIS — Z7901 Long term (current) use of anticoagulants: Secondary | ICD-10-CM | POA: Diagnosis not present

## 2016-09-11 DIAGNOSIS — I25119 Atherosclerotic heart disease of native coronary artery with unspecified angina pectoris: Secondary | ICD-10-CM | POA: Diagnosis not present

## 2016-10-08 ENCOUNTER — Other Ambulatory Visit: Payer: Self-pay | Admitting: Nurse Practitioner

## 2016-11-05 DIAGNOSIS — L821 Other seborrheic keratosis: Secondary | ICD-10-CM | POA: Diagnosis not present

## 2016-11-05 DIAGNOSIS — B078 Other viral warts: Secondary | ICD-10-CM | POA: Diagnosis not present

## 2016-11-29 DIAGNOSIS — H2513 Age-related nuclear cataract, bilateral: Secondary | ICD-10-CM | POA: Diagnosis not present

## 2016-11-29 DIAGNOSIS — H40013 Open angle with borderline findings, low risk, bilateral: Secondary | ICD-10-CM | POA: Diagnosis not present

## 2016-12-04 ENCOUNTER — Encounter: Payer: Self-pay | Admitting: Nurse Practitioner

## 2016-12-04 ENCOUNTER — Ambulatory Visit (INDEPENDENT_AMBULATORY_CARE_PROVIDER_SITE_OTHER): Payer: Medicare Other | Admitting: Nurse Practitioner

## 2016-12-04 VITALS — BP 105/61 | HR 54 | Temp 98.3°F | Ht 64.0 in | Wt 165.0 lb

## 2016-12-04 DIAGNOSIS — I25111 Atherosclerotic heart disease of native coronary artery with angina pectoris with documented spasm: Secondary | ICD-10-CM | POA: Diagnosis not present

## 2016-12-04 DIAGNOSIS — F3342 Major depressive disorder, recurrent, in full remission: Secondary | ICD-10-CM

## 2016-12-04 DIAGNOSIS — I10 Essential (primary) hypertension: Secondary | ICD-10-CM | POA: Diagnosis not present

## 2016-12-04 DIAGNOSIS — K219 Gastro-esophageal reflux disease without esophagitis: Secondary | ICD-10-CM | POA: Diagnosis not present

## 2016-12-04 DIAGNOSIS — F1120 Opioid dependence, uncomplicated: Secondary | ICD-10-CM

## 2016-12-04 DIAGNOSIS — Z6830 Body mass index (BMI) 30.0-30.9, adult: Secondary | ICD-10-CM

## 2016-12-04 DIAGNOSIS — E785 Hyperlipidemia, unspecified: Secondary | ICD-10-CM | POA: Diagnosis not present

## 2016-12-04 DIAGNOSIS — M1612 Unilateral primary osteoarthritis, left hip: Secondary | ICD-10-CM

## 2016-12-04 LAB — CMP14+EGFR
ALBUMIN: 4.4 g/dL (ref 3.5–4.8)
ALK PHOS: 90 IU/L (ref 39–117)
ALT: 8 IU/L (ref 0–32)
AST: 15 IU/L (ref 0–40)
Albumin/Globulin Ratio: 2.2 (ref 1.2–2.2)
BUN / CREAT RATIO: 17 (ref 12–28)
BUN: 15 mg/dL (ref 8–27)
Bilirubin Total: 0.4 mg/dL (ref 0.0–1.2)
CO2: 29 mmol/L (ref 20–29)
CREATININE: 0.89 mg/dL (ref 0.57–1.00)
Calcium: 10 mg/dL (ref 8.7–10.3)
Chloride: 99 mmol/L (ref 96–106)
GFR calc Af Amer: 74 mL/min/{1.73_m2} (ref >59)
GFR calc non Af Amer: 64 mL/min/{1.73_m2} (ref >59)
GLUCOSE: 89 mg/dL (ref 65–99)
Globulin, Total: 2 g/dL (ref 1.5–4.5)
Potassium: 3.9 mmol/L (ref 3.5–5.2)
Sodium: 142 mmol/L (ref 134–144)
Total Protein: 6.4 g/dL (ref 6.0–8.5)

## 2016-12-04 LAB — LIPID PANEL
CHOLESTEROL TOTAL: 127 mg/dL (ref 100–199)
Chol/HDL Ratio: 2 ratio (ref 0.0–4.4)
HDL: 64 mg/dL (ref >39)
LDL CALC: 38 mg/dL (ref 0–99)
Triglycerides: 125 mg/dL (ref 0–149)
VLDL Cholesterol Cal: 25 mg/dL (ref 5–40)

## 2016-12-04 MED ORDER — ATORVASTATIN CALCIUM 40 MG PO TABS: 40.0000 mg | ORAL_TABLET | Freq: Every day | ORAL | 0 refills | Status: DC

## 2016-12-04 MED ORDER — BENAZEPRIL HCL 40 MG PO TABS: 40.0000 mg | ORAL_TABLET | Freq: Every day | ORAL | 1 refills | Status: DC

## 2016-12-04 MED ORDER — HYDROCODONE-ACETAMINOPHEN 5-325 MG PO TABS: 1.0000 | ORAL_TABLET | Freq: Four times a day (QID) | ORAL | 0 refills | Status: DC | PRN

## 2016-12-04 MED ORDER — CLOPIDOGREL BISULFATE 75 MG PO TABS: 75.0000 mg | ORAL_TABLET | Freq: Every day | ORAL | 1 refills | Status: DC

## 2016-12-04 MED ORDER — AMLODIPINE BESYLATE 10 MG PO TABS: 10.0000 mg | ORAL_TABLET | Freq: Every day | ORAL | 1 refills | Status: DC

## 2016-12-04 MED ORDER — HYDROCHLOROTHIAZIDE 25 MG PO TABS: ORAL_TABLET | ORAL | 1 refills | Status: DC

## 2016-12-04 MED ORDER — PAROXETINE HCL 40 MG PO TABS: 40.0000 mg | ORAL_TABLET | Freq: Every day | ORAL | 1 refills | Status: DC

## 2016-12-04 MED ORDER — HYDROCODONE-ACETAMINOPHEN 5-325 MG PO TABS: ORAL_TABLET | ORAL | 0 refills | Status: DC

## 2016-12-04 NOTE — Progress Notes (Signed)
Subjective:    Patient ID: Teresa Holloway, female    DOB: 01-19-1941, 76 y.o.   MRN: [id number]  HPI   QUANTELLA QUAM is here today for follow up of chronic medical problem.  Outpatient Encounter Prescriptions as of 12/04/2016  Medication Sig  . amLODipine (NORVASC) 10 MG tablet Take 1 tablet (10 mg total) by mouth daily.  Marland Kitchen aspirin 81 MG tablet Take 81 mg by mouth daily.  Marland Kitchen atorvastatin (LIPITOR) 40 MG tablet Take 1 tablet (40 mg total) by mouth daily at 6 PM.  . benazepril (LOTENSIN) 40 MG tablet Take 1 tablet (40 mg total) by mouth daily.  . cholecalciferol (VITAMIN D) 1000 UNITS tablet Take 1,000 Units by mouth daily.  . clopidogrel (PLAVIX) 75 MG tablet Take 1 tablet (75 mg total) by mouth daily.  . [facility] RANITIDINE 75 MG tablet TAKE 1 TABLET (75 MG TOTAL) BY MOUTH DAILY.  . hydrochlorothiazide (HYDRODIURIL) 25 MG tablet TAKE 1 TABLET (25 MG TOTAL) BY MOUTH DAILY.  Marland Kitchen HYDROcodone-acetaminophen (NORCO) 5-325 MG tablet Take 1 tablet by mouth every 6 (six) hours as needed for moderate pain.  Marland Kitchen HYDROcodone-acetaminophen (NORCO/VICODIN) 5-325 MG tablet Take 1 tablet by mouth every 6 (six) hours as needed for moderate pain.  Marland Kitchen HYDROcodone-acetaminophen (NORCO/VICODIN) 5-325 MG tablet TAKE 1 TABLET BY MOUTH EVERY 6 HOURS AS NEEDED FOR PAIN  . isosorbide mononitrate (IMDUR) 60 MG 24 hr tablet TAKE 1 TABLET (60 MG TOTAL) BY MOUTH DAILY.  Marland Kitchen latanoprost (XALATAN) 0.005 % ophthalmic solution Place 1 drop into both eyes at bedtime.  Marland Kitchen LUMIGAN 0.01 % SOLN USE 1 DROP AT BEDTIME INTO BOTH EYES  . metoprolol succinate (TOPROL-XL) 100 MG 24 hr tablet Take 1/2 tablet daily  . NITROSTAT 0.4 MG SL tablet Place 0.4 mg under the tongue every 5 (five) minutes as needed for chest pain.   Marland Kitchen PARoxetine (PAXIL) 40 MG tablet Take 1 tablet (40 mg total) by mouth daily.     1. Essential hypertension, benign  No c/o chest pain,SOB or headache. Has no way of checking blood pressure at home  2. Coronary artery  disease involving native coronary artery of native heart with angina pectoris with documented spasm [facility]) sees cardiology yearly. Last appointmnet was 09/11/16.  3. Gastroesophageal reflux disease without esophagitis  Currently only taking OTC meds as needed  4. Opioid type dependence, continuous (HCC)  Patient has been on pain medication for several years Pain assessment: Cause of pain- osteoarthritia Pain location- right hip and left knee Pain on scale of 1-10- 7-8/10 pain  meds will bring down to 4-5/10 Frequency- daily What increases pain-movemnt and walking What makes pain Better- pain meds and aspercreme Effects on ADL - cannot do ADL's without pain  meds Any change in general medical condition-no  Current medications- norco 5/325 BID Effectiveness of current meds-do not completely relieve pain Adverse reactions form pain meds-none Morphine equivalent <20  Pill count performed-No Urine drug screen- No Was the NCCSR reviewed- yes  If yes were their any concerning findings? - no issues   5. Hyperlipidemia with target LDL less than 100  Does not eat fried foods except on occasion  6. Recurrent major depressive disorder, in full remission (HCC)  Takes paxil  Depression screen [facility] 2/9 12/04/2016 08/22/2016 06/10/2016 05/30/2016 05/20/2016  Decreased Interest 0 0 0 0 0  Down, Depressed, Hopeless 0 0 0 0 0  PHQ - 2 Score 0 0 0 0 0     7. BMI  30.0-30.9,adult  No recent weight change    New complaints: No complaint  Social history: Husband still living   Review of Systems  Constitutional: Negative for activity change and appetite change.  HENT: Negative.   Eyes: Negative for pain.  Respiratory: Negative for shortness of breath.   Cardiovascular: Negative for chest pain, palpitations and leg swelling.  Gastrointestinal: Negative for abdominal pain.  Endocrine: Negative for polydipsia.  Genitourinary: Negative.   Skin: Negative for rash.  Neurological: Negative for  dizziness, weakness and headaches.  Hematological: Does not bruise/bleed easily.  Psychiatric/Behavioral: Negative.   All other systems reviewed and are negative.      Objective:   Physical Exam  Constitutional: She is oriented to person, place, and time. She appears well-developed and well-nourished.  HENT:  Nose: Nose normal.  Mouth/Throat: Oropharynx is clear and moist.  Eyes: EOM are normal.  Neck: Trachea normal, normal range of motion and full passive range of motion without pain. Neck supple. No JVD present. Carotid bruit is not present. No thyromegaly present.  Cardiovascular: Normal rate, regular rhythm, normal heart sounds and intact distal pulses.  Exam reveals no gallop and no friction rub.   No murmur heard. Pulmonary/Chest: Effort normal and breath sounds normal.  Abdominal: Soft. Bowel sounds are normal. She exhibits no distension and no mass. There is no tenderness.  Musculoskeletal: Normal range of motion.  Pain on flexion and extension of left knee- no effusion Pain right hip with abduction  Lymphadenopathy:    She has no cervical adenopathy.  Neurological: She is alert and oriented to person, place, and time. She has normal reflexes.  Skin: Skin is warm and dry.  Psychiatric: She has a normal mood and affect. Her behavior is normal. Judgment and thought content normal.   BP 105/61   Pulse (!) 54   Temp 98.3 F (36.8 C) (Oral)   Ht 5\' 4"  (1.626 m)   Wt 165 lb (74.8 kg)   BMI 28.32 kg/m         Assessment & Plan:  1. Essential hypertension, benign Low sodium diet - CMP14+EGFR - benazepril (LOTENSIN) 40 MG tablet; Take 1 tablet (40 mg total) by mouth daily.  Dispense: 90 tablet; Refill: 1 - amLODipine (NORVASC) 10 MG tablet; Take 1 tablet (10 mg total) by mouth daily.  Dispense: 90 tablet; Refill: 1 - hydrochlorothiazide (HYDRODIURIL) 25 MG tablet; TAKE 1 TABLET (25 MG TOTAL) BY MOUTH DAILY.  Dispense: 90 tablet; Refill: 1  2. Coronary artery disease  involving native coronary artery of native heart with angina pectoris with documented spasm (HCC) Watch for bleeding Keep follow up appointments with cardiology - clopidogrel (PLAVIX) 75 MG tablet; Take 1 tablet (75 mg total) by mouth daily.  Dispense: 90 tablet; Refill: 1  3. Gastroesophageal reflux disease without esophagitis Avoid spicy foods Do not eat 2 hours prior to bedtime  4. Opioid type dependence, continuous (HCC)   5. Hyperlipidemia with target LDL less than 100 Low fat diet - Lipid panel - atorvastatin (LIPITOR) 40 MG tablet; Take 1 tablet (40 mg total) by mouth daily at 6 PM.  Dispense: 90 tablet; Refill: 0  6. Recurrent major depressive disorder, in full remission (HCC) Stress management - PARoxetine (PAXIL) 40 MG tablet; Take 1 tablet (40 mg total) by mouth daily.  Dispense: 90 tablet; Refill: 1  7. BMI 30.0-30.9,adult Discussed diet and exercise for person with BMI >25 Will recheck weight in 3-6 months  8. Osteoarthritis of left hip, unspecified osteoarthritis type  Daily exercise as can tolerate - HYDROcodone-acetaminophen (NORCO/VICODIN) 5-325 MG tablet; Take 1 tablet by mouth every 6 (six) hours as needed for moderate pain.  Dispense: 60 tablet; Refill: 0 - HYDROcodone-acetaminophen (NORCO) 5-325 MG tablet; Take 1 tablet by mouth every 6 (six) hours as needed for moderate pain.  Dispense: 60 tablet; Refill: 0 - HYDROcodone-acetaminophen (NORCO/VICODIN) 5-325 MG tablet; TAKE 1 TABLET BY MOUTH EVERY 6 HOURS AS NEEDED FOR PAIN  Dispense: 60 tablet; Refill: 0   Keep mammogram appointment Labs pending Health maintenance reviewed Diet and exercise encouraged Continue all meds Follow up  In 3 months   Mary-Margaret Daphine Deutscher, FNP

## 2016-12-04 NOTE — Patient Instructions (Signed)
Stress and Stress Management Stress is a normal reaction to life events. It is what you feel when life demands more than you are used to or more than you can handle. Some stress can be useful. For example, the stress reaction can help you catch the last bus of the day, study for a test, or meet a deadline at work. But stress that occurs too often or for too long can cause problems. It can affect your emotional health and interfere with relationships and normal daily activities. Too much stress can weaken your immune system and increase your risk for physical illness. If you already have a medical problem, stress can make it worse. What are the causes? All sorts of life events may cause stress. An event that causes stress for one person may not be stressful for another person. Major life events commonly cause stress. These may be positive or negative. Examples include losing your job, moving into a new home, getting married, having a baby, or losing a loved one. Less obvious life events may also cause stress, especially if they occur day after day or in combination. Examples include working long hours, driving in traffic, caring for children, being in debt, or being in a difficult relationship. What are the signs or symptoms? Stress may cause emotional symptoms including, the following:  Anxiety. This is feeling worried, afraid, on edge, overwhelmed, or out of control.  Anger. This is feeling irritated or impatient.  Depression. This is feeling sad, down, helpless, or guilty.  Difficulty focusing, remembering, or making decisions.  Stress may cause physical symptoms, including the following:  Aches and pains. These may affect your head, neck, back, stomach, or other areas of your body.  Tight muscles or clenched jaw.  Low energy or trouble sleeping.  Stress may cause unhealthy behaviors, including the following:  Eating to feel better (overeating) or skipping meals.  Sleeping too little,  too much, or both.  Working too much or putting off tasks (procrastination).  Smoking, drinking alcohol, or using drugs to feel better.  How is this diagnosed? Stress is diagnosed through an assessment by your health care provider. Your health care provider will ask questions about your symptoms and any stressful life events.Your health care provider will also ask about your medical history and may order blood tests or other tests. Certain medical conditions and medicine can cause physical symptoms similar to stress. Mental illness can cause emotional symptoms and unhealthy behaviors similar to stress. Your health care provider may refer you to a mental health professional for further evaluation. How is this treated? Stress management is the recommended treatment for stress.The goals of stress management are reducing stressful life events and coping with stress in healthy ways. Techniques for reducing stressful life events include the following:  Stress identification. Self-monitor for stress and identify what causes stress for you. These skills may help you to avoid some stressful events.  Time management. Set your priorities, keep a calendar of events, and learn to say "no." These tools can help you avoid making too many commitments.  Techniques for coping with stress include the following:  Rethinking the problem. Try to think realistically about stressful events rather than ignoring them or overreacting. Try to find the positives in a stressful situation rather than focusing on the negatives.  Exercise. Physical exercise can release both physical and emotional tension. The key is to find a form of exercise you enjoy and do it regularly.  Relaxation techniques. These relax the body and  mind. Examples include yoga, meditation, tai chi, biofeedback, deep breathing, progressive muscle relaxation, listening to music, being out in nature, journaling, and other hobbies. Again, the key is to find  one or more that you enjoy and can do regularly.  Healthy lifestyle. Eat a balanced diet, get plenty of sleep, and do not smoke. Avoid using alcohol or drugs to relax.  Strong support network. Spend time with family, friends, or other people you enjoy being around.Express your feelings and talk things over with someone you trust.  Counseling or talktherapy with a mental health professional may be helpful if you are having difficulty managing stress on your own. Medicine is typically not recommended for the treatment of stress.Talk to your health care provider if you think you need medicine for symptoms of stress. Follow these instructions at home:  Keep all follow-up visits as directed by your health care provider.  Take all medicines as directed by your health care provider. Contact a health care provider if:  Your symptoms get worse or you start having new symptoms.  You feel overwhelmed by your problems and can no longer manage them on your own. Get help right away if:  You feel like hurting yourself or someone else. This information is not intended to replace advice given to you by your health care provider. Make sure you discuss any questions you have with your health care provider. Document Released: 10/02/2000 Document Revised: 09/14/2015 Document Reviewed: 12/01/2012 Elsevier Interactive Patient Education  2017 [employer].

## 2016-12-30 DIAGNOSIS — H268 Other specified cataract: Secondary | ICD-10-CM | POA: Diagnosis not present

## 2016-12-30 DIAGNOSIS — H40013 Open angle with borderline findings, low risk, bilateral: Secondary | ICD-10-CM | POA: Diagnosis not present

## 2016-12-30 DIAGNOSIS — H2589 Other age-related cataract: Secondary | ICD-10-CM | POA: Diagnosis not present

## 2016-12-30 DIAGNOSIS — H2513 Age-related nuclear cataract, bilateral: Secondary | ICD-10-CM | POA: Diagnosis not present

## 2017-01-08 ENCOUNTER — Other Ambulatory Visit: Payer: Self-pay | Admitting: Nurse Practitioner

## 2017-03-07 ENCOUNTER — Encounter: Payer: Self-pay | Admitting: Nurse Practitioner

## 2017-03-07 ENCOUNTER — Ambulatory Visit (INDEPENDENT_AMBULATORY_CARE_PROVIDER_SITE_OTHER): Payer: Medicare Other | Admitting: Nurse Practitioner

## 2017-03-07 VITALS — BP 118/68 | HR 52 | Temp 97.6°F | Ht 64.0 in | Wt 166.0 lb

## 2017-03-07 DIAGNOSIS — F3342 Major depressive disorder, recurrent, in full remission: Secondary | ICD-10-CM

## 2017-03-07 DIAGNOSIS — Z6830 Body mass index (BMI) 30.0-30.9, adult: Secondary | ICD-10-CM | POA: Diagnosis not present

## 2017-03-07 DIAGNOSIS — I10 Essential (primary) hypertension: Secondary | ICD-10-CM

## 2017-03-07 DIAGNOSIS — R300 Dysuria: Secondary | ICD-10-CM

## 2017-03-07 DIAGNOSIS — I25111 Atherosclerotic heart disease of native coronary artery with angina pectoris with documented spasm: Secondary | ICD-10-CM | POA: Diagnosis not present

## 2017-03-07 DIAGNOSIS — K219 Gastro-esophageal reflux disease without esophagitis: Secondary | ICD-10-CM

## 2017-03-07 DIAGNOSIS — M1612 Unilateral primary osteoarthritis, left hip: Secondary | ICD-10-CM

## 2017-03-07 DIAGNOSIS — Z23 Encounter for immunization: Secondary | ICD-10-CM

## 2017-03-07 DIAGNOSIS — E785 Hyperlipidemia, unspecified: Secondary | ICD-10-CM

## 2017-03-07 LAB — CMP14+EGFR
A/G RATIO: 1.8 (ref 1.2–2.2)
ALT: 9 IU/L (ref 0–32)
AST: 9 IU/L (ref 0–40)
Albumin: 4.4 g/dL (ref 3.5–4.8)
Alkaline Phosphatase: 91 IU/L (ref 39–117)
BUN/Creatinine Ratio: 16 (ref 12–28)
BUN: 16 mg/dL (ref 8–27)
Bilirubin Total: 0.5 mg/dL (ref 0.0–1.2)
CALCIUM: 9.8 mg/dL (ref 8.7–10.3)
CO2: 30 mmol/L — AB (ref 20–29)
CREATININE: 0.98 mg/dL (ref 0.57–1.00)
Chloride: 98 mmol/L (ref 96–106)
GFR calc Af Amer: 65 mL/min/{1.73_m2} (ref >59)
GFR, EST NON AFRICAN AMERICAN: 57 mL/min/{1.73_m2} — AB (ref >59)
GLUCOSE: 86 mg/dL (ref 65–99)
Globulin, Total: 2.4 g/dL (ref 1.5–4.5)
POTASSIUM: 3.8 mmol/L (ref 3.5–5.2)
Sodium: 139 mmol/L (ref 134–144)
Total Protein: 6.8 g/dL (ref 6.0–8.5)

## 2017-03-07 LAB — URINALYSIS, COMPLETE
Bilirubin, UA: NEGATIVE
GLUCOSE, UA: NEGATIVE
Leukocytes, UA: NEGATIVE
NITRITE UA: NEGATIVE
Protein, UA: NEGATIVE
Specific Gravity, UA: 1.015 (ref 1.005–1.030)
Urobilinogen, Ur: 0.2 mg/dL (ref 0.2–1.0)
pH, UA: 6 (ref 5.0–7.5)

## 2017-03-07 LAB — MICROSCOPIC EXAMINATION

## 2017-03-07 LAB — LIPID PANEL
CHOL/HDL RATIO: 2 ratio (ref 0.0–4.4)
Cholesterol, Total: 127 mg/dL (ref 100–199)
HDL: 65 mg/dL (ref >39)
LDL Calculated: 45 mg/dL (ref 0–99)
TRIGLYCERIDES: 86 mg/dL (ref 0–149)
VLDL Cholesterol Cal: 17 mg/dL (ref 5–40)

## 2017-03-07 MED ORDER — HYDROCODONE-ACETAMINOPHEN 5-325 MG PO TABS: ORAL_TABLET | ORAL | 0 refills | Status: DC

## 2017-03-07 MED ORDER — ISOSORBIDE MONONITRATE ER 60 MG PO TB24: 60.0000 mg | ORAL_TABLET | Freq: Every day | ORAL | 1 refills | Status: DC

## 2017-03-07 MED ORDER — HYDROCODONE-ACETAMINOPHEN 5-325 MG PO TABS: 1.0000 | ORAL_TABLET | Freq: Four times a day (QID) | ORAL | 0 refills | Status: DC | PRN

## 2017-03-07 MED ORDER — METOPROLOL SUCCINATE ER 100 MG PO TB24: 100.0000 mg | ORAL_TABLET | Freq: Every day | ORAL | 1 refills | Status: DC

## 2017-03-07 NOTE — Progress Notes (Signed)
Subjective:    Patient ID: Teresa Holloway, female    DOB: December 25, 1940, 76 y.o.   MRN: [id number]  HPI  Teresa Holloway is here today for follow up of chronic medical problem.  Outpatient Encounter Medications as of 03/07/2017  Medication Sig  . amLODipine (NORVASC) 10 MG tablet Take 1 tablet (10 mg total) by mouth daily.  Marland Kitchen aspirin 81 MG tablet Take 81 mg by mouth daily.  Marland Kitchen atorvastatin (LIPITOR) 40 MG tablet Take 1 tablet (40 mg total) by mouth daily at 6 PM.  . benazepril (LOTENSIN) 40 MG tablet Take 1 tablet (40 mg total) by mouth daily.  . cholecalciferol (VITAMIN D) 1000 UNITS tablet Take 1,000 Units by mouth daily.  . clopidogrel (PLAVIX) 75 MG tablet Take 1 tablet (75 mg total) by mouth daily.  . [facility] RANITIDINE 75 MG tablet TAKE 1 TABLET (75 MG TOTAL) BY MOUTH DAILY.  . hydrochlorothiazide (HYDRODIURIL) 25 MG tablet TAKE 1 TABLET (25 MG TOTAL) BY MOUTH DAILY.  Marland Kitchen HYDROcodone-acetaminophen (NORCO) 5-325 MG tablet Take 1 tablet by mouth every 6 (six) hours as needed for moderate pain.  Marland Kitchen HYDROcodone-acetaminophen (NORCO/VICODIN) 5-325 MG tablet Take 1 tablet by mouth every 6 (six) hours as needed for moderate pain.  Marland Kitchen HYDROcodone-acetaminophen (NORCO/VICODIN) 5-325 MG tablet TAKE 1 TABLET BY MOUTH EVERY 6 HOURS AS NEEDED FOR PAIN  . isosorbide mononitrate (IMDUR) 60 MG 24 hr tablet TAKE 1 TABLET BY MOUTH EVERY DAY  . latanoprost (XALATAN) 0.005 % ophthalmic solution Place 1 drop into both eyes at bedtime.  Marland Kitchen LUMIGAN 0.01 % SOLN USE 1 DROP AT BEDTIME INTO BOTH EYES  . metoprolol succinate (TOPROL-XL) 100 MG 24 hr tablet Take 1/2 tablet daily  . NITROSTAT 0.4 MG SL tablet Place 0.4 mg under the tongue every 5 (five) minutes as needed for chest pain.   Marland Kitchen PARoxetine (PAXIL) 40 MG tablet Take 1 tablet (40 mg total) by mouth daily.   No facility-administered encounter medications on file as of 03/07/2017.     1. Essential hypertension, benign  No c/o chest pain, sob or headache.  Does not check blood pressure at home. BP Readings from Last 3 Encounters:  12/04/16 105/61  08/22/16 (!) 95/57  06/10/16 107/67     2. Coronary artery disease involving native coronary artery of native heart with angina pectoris with documented spasm [facility]) had follow up with cardiology in may 2018. No changes were made at that time according to chart.  3. Gastroesophageal reflux disease without esophagitis  Takes ranitidine as needed OTC.  4. Hyperlipidemia with target LDL less than 100  Does not watch diet  5. Recurrent major depressive disorder, in full remission [facility])  Patient has been on paxil for many years- she says it is working well for her. Depression screen [facility] 2/9 12/04/2016 08/22/2016 06/10/2016  Decreased Interest 0 0 0  Down, Depressed, Hopeless 0 0 0  PHQ - 2 Score 0 0 0     6. BMI 30.0-30.9,adult  No recent weight changes  7. Primary osteoarthritis of left hip  Patient has chronic hip pain. Pain assessment: Cause of pain- hip pain and knee pain Pain location- osteoarthritis Pain on scale of 1-10- knee pain 9/10 currently Frequency- daily What increases pain-walking and standing for long periods of time What makes pain Better-rest Effects on ADL - rest and pain meds Any change in general medical condition-none  Current medications- norco 5/325 BID Effectiveness of current meds- brings pain down to about 2-3/10  Adverse reactions form pain meds-none Morphine equivalent 20  Pill count performed-No Urine drug screen- No Was the NCCSR reviewed- yes  If yes were their any concerning findings? - none  Pain contract signed on: 03/07/17     New complaints: Right knee pain is worsening and sheis thinking about having total knee replacement  Social history: Lives with husband    Review of Systems  Constitutional: Negative for activity change and appetite change.  HENT: Negative.   Eyes: Negative for pain.  Respiratory: Negative for shortness of breath.     Cardiovascular: Negative for chest pain, palpitations and leg swelling.  Gastrointestinal: Negative for abdominal pain.  Endocrine: Negative for polydipsia.  Genitourinary: Negative.   Musculoskeletal: Positive for arthralgias (right hip and knee).  Skin: Negative for rash.  Neurological: Negative for dizziness, weakness and headaches.  Hematological: Does not bruise/bleed easily.  Psychiatric/Behavioral: Negative.   All other systems reviewed and are negative.      Objective:   Physical Exam  Constitutional: She is oriented to person, place, and time. She appears well-developed and well-nourished.  HENT:  Nose: Nose normal.  Mouth/Throat: Oropharynx is clear and moist.  Eyes: EOM are normal.  Neck: Trachea normal, normal range of motion and full passive range of motion without pain. Neck supple. No JVD present. Carotid bruit is not present. No thyromegaly present.  Cardiovascular: Normal rate, regular rhythm, normal heart sounds and intact distal pulses. Exam reveals no gallop and no friction rub.  No murmur heard. Pulmonary/Chest: Effort normal and breath sounds normal.  Abdominal: Soft. Bowel sounds are normal. She exhibits no distension and no mass. There is no tenderness.  Musculoskeletal: Normal range of motion.  From of right knee with crepitus on flexion and extension No effusion  Lymphadenopathy:    She has no cervical adenopathy.  Neurological: She is alert and oriented to person, place, and time. She has normal reflexes.  Skin: Skin is warm and dry.  Psychiatric: She has a normal mood and affect. Her behavior is normal. Judgment and thought content normal.   BP 118/68   Pulse (!) 52   Temp 97.6 F (36.4 C) (Oral)   Ht 5\' 4"  (1.626 m)   Wt 166 lb (75.3 kg)   BMI 28.49 kg/m   Urine clear    Assessment & Plan:  1. Essential hypertension, benign Low sodium diet  2. Coronary artery disease involving native coronary artery of native heart with angina pectoris  with documented spasm (HCC) Keep follow up with cardiology - isosorbide mononitrate (IMDUR) 60 MG 24 hr tablet; Take 1 tablet (60 mg total) daily by mouth.  Dispense: 90 tablet; Refill: 1 - metoprolol succinate (TOPROL-XL) 100 MG 24 hr tablet; Take 1 tablet (100 mg total) daily by mouth. Take 1/2 tablet daily  Dispense: 90 tablet; Refill: 1  3. Gastroesophageal reflux disease without esophagitis Avoid spicy foods Do not eat 2 hours prior to bedtime  4. Hyperlipidemia with target LDL less than 100 Low fat diet  5. Recurrent major depressive disorder, in full remission [facility]) Stress management  6. BMI 30.0-30.9,adult Discussed diet and exercise for person with BMI >25 Will recheck weight in 3-6 months  7. Primary osteoarthritis of left hip  8. Dysuria Force fluids - Urinalysis, Complete  9. Osteoarthritis of left hip, unspecified osteoarthritis type Ice Rest  Follow up with ortho - HYDROcodone-acetaminophen (NORCO/VICODIN) 5-325 MG tablet; Take 1 tablet every 6 (six) hours as needed by mouth for moderate pain.  Dispense: 60 tablet;  Refill: 0 - HYDROcodone-acetaminophen (NORCO) 5-325 MG tablet; Take 1 tablet every 6 (six) hours as needed by mouth for moderate pain.  Dispense: 60 tablet; Refill: 0 - HYDROcodone-acetaminophen (NORCO/VICODIN) 5-325 MG tablet; TAKE 1 TABLET BY MOUTH EVERY 6 HOURS AS NEEDED FOR PAIN  Dispense: 60 tablet; Refill: 0    Labs pending Health maintenance reviewed Diet and exercise encouraged Continue all meds Follow up  In 3 months  Mary-Margaret Daphine Deutscher, FNP

## 2017-03-07 NOTE — Patient Instructions (Signed)

## 2017-03-07 NOTE — Addendum Note (Signed)
Addended by: Rolena Infante on: 03/07/2017 02:56 PM   Modules accepted: Orders

## 2017-03-07 NOTE — Addendum Note (Signed)
Addended by: Chevis Pretty on: 03/07/2017 11:43 AM   Modules accepted: Orders

## 2017-03-11 ENCOUNTER — Ambulatory Visit: Payer: Medicare Other | Admitting: Nurse Practitioner

## 2017-05-10 ENCOUNTER — Other Ambulatory Visit: Payer: Self-pay | Admitting: Nurse Practitioner

## 2017-05-10 DIAGNOSIS — E785 Hyperlipidemia, unspecified: Secondary | ICD-10-CM

## 2017-05-14 ENCOUNTER — Other Ambulatory Visit: Payer: Self-pay | Admitting: Nurse Practitioner

## 2017-05-14 DIAGNOSIS — E785 Hyperlipidemia, unspecified: Secondary | ICD-10-CM

## 2017-06-09 ENCOUNTER — Ambulatory Visit (INDEPENDENT_AMBULATORY_CARE_PROVIDER_SITE_OTHER): Payer: Medicare Other | Admitting: Nurse Practitioner

## 2017-06-09 ENCOUNTER — Encounter: Payer: Self-pay | Admitting: Nurse Practitioner

## 2017-06-09 VITALS — BP 99/55 | HR 54 | Temp 97.3°F | Ht 64.0 in | Wt 165.0 lb

## 2017-06-09 DIAGNOSIS — Z6830 Body mass index (BMI) 30.0-30.9, adult: Secondary | ICD-10-CM

## 2017-06-09 DIAGNOSIS — E785 Hyperlipidemia, unspecified: Secondary | ICD-10-CM

## 2017-06-09 DIAGNOSIS — M810 Age-related osteoporosis without current pathological fracture: Secondary | ICD-10-CM | POA: Diagnosis not present

## 2017-06-09 DIAGNOSIS — I25111 Atherosclerotic heart disease of native coronary artery with angina pectoris with documented spasm: Secondary | ICD-10-CM | POA: Diagnosis not present

## 2017-06-09 DIAGNOSIS — I10 Essential (primary) hypertension: Secondary | ICD-10-CM

## 2017-06-09 DIAGNOSIS — K219 Gastro-esophageal reflux disease without esophagitis: Secondary | ICD-10-CM | POA: Diagnosis not present

## 2017-06-09 DIAGNOSIS — M1612 Unilateral primary osteoarthritis, left hip: Secondary | ICD-10-CM

## 2017-06-09 DIAGNOSIS — M170 Bilateral primary osteoarthritis of knee: Secondary | ICD-10-CM

## 2017-06-09 DIAGNOSIS — F3342 Major depressive disorder, recurrent, in full remission: Secondary | ICD-10-CM

## 2017-06-09 DIAGNOSIS — F1120 Opioid dependence, uncomplicated: Secondary | ICD-10-CM

## 2017-06-09 MED ORDER — BENAZEPRIL HCL 40 MG PO TABS: 40.0000 mg | ORAL_TABLET | Freq: Every day | ORAL | 1 refills | Status: DC

## 2017-06-09 MED ORDER — AMLODIPINE BESYLATE 10 MG PO TABS: 10.0000 mg | ORAL_TABLET | Freq: Every day | ORAL | 1 refills | Status: DC

## 2017-06-09 MED ORDER — CLOPIDOGREL BISULFATE 75 MG PO TABS: 75.0000 mg | ORAL_TABLET | Freq: Every day | ORAL | 1 refills | Status: DC

## 2017-06-09 MED ORDER — ATORVASTATIN CALCIUM 40 MG PO TABS: 40.0000 mg | ORAL_TABLET | Freq: Every day | ORAL | 0 refills | Status: DC

## 2017-06-09 MED ORDER — PAROXETINE HCL 40 MG PO TABS: 40.0000 mg | ORAL_TABLET | Freq: Every day | ORAL | 1 refills | Status: DC

## 2017-06-09 MED ORDER — HYDROCHLOROTHIAZIDE 25 MG PO TABS: ORAL_TABLET | ORAL | 1 refills | Status: DC

## 2017-06-09 MED ORDER — HYDROCODONE-ACETAMINOPHEN 5-325 MG PO TABS: ORAL_TABLET | ORAL | 0 refills | Status: DC

## 2017-06-09 MED ORDER — HYDROCODONE-ACETAMINOPHEN 5-325 MG PO TABS: 1.0000 | ORAL_TABLET | Freq: Four times a day (QID) | ORAL | 0 refills | Status: DC | PRN

## 2017-06-09 MED ORDER — ISOSORBIDE MONONITRATE ER 60 MG PO TB24: 60.0000 mg | ORAL_TABLET | Freq: Every day | ORAL | 1 refills | Status: DC

## 2017-06-09 MED ORDER — METOPROLOL SUCCINATE ER 100 MG PO TB24: 100.0000 mg | ORAL_TABLET | Freq: Every day | ORAL | 1 refills | Status: DC

## 2017-06-09 NOTE — Addendum Note (Signed)
Addended by: Chevis Pretty on: 06/09/2017 08:51 AM   Modules accepted: Orders

## 2017-06-09 NOTE — Patient Instructions (Signed)

## 2017-06-09 NOTE — Progress Notes (Signed)
Subjective:    Patient ID: Teresa Holloway, female    DOB: 13-Jan-1941, 77 y.o.   MRN: [id number]  HPI  Teresa Holloway is here today for follow up of chronic medical problem.  Outpatient Encounter Medications as of 06/09/2017  Medication Sig  . amLODipine (NORVASC) 10 MG tablet Take 1 tablet (10 mg total) by mouth daily.  Marland Kitchen aspirin 81 MG tablet Take 81 mg by mouth daily.  Marland Kitchen atorvastatin (LIPITOR) 40 MG tablet TAKE 1 TABLET (40 MG TOTAL) BY MOUTH DAILY AT 6 PM.  . benazepril (LOTENSIN) 40 MG tablet Take 1 tablet (40 mg total) by mouth daily.  . cholecalciferol (VITAMIN D) 1000 UNITS tablet Take 1,000 Units by mouth daily.  . clopidogrel (PLAVIX) 75 MG tablet Take 1 tablet (75 mg total) by mouth daily.  . [facility] RANITIDINE 75 MG tablet TAKE 1 TABLET (75 MG TOTAL) BY MOUTH DAILY.  . hydrochlorothiazide (HYDRODIURIL) 25 MG tablet TAKE 1 TABLET (25 MG TOTAL) BY MOUTH DAILY.  Marland Kitchen HYDROcodone-acetaminophen (NORCO) 5-325 MG tablet Take 1 tablet every 6 (six) hours as needed by mouth for moderate pain.  Marland Kitchen HYDROcodone-acetaminophen (NORCO/VICODIN) 5-325 MG tablet Take 1 tablet every 6 (six) hours as needed by mouth for moderate pain.  Marland Kitchen HYDROcodone-acetaminophen (NORCO/VICODIN) 5-325 MG tablet TAKE 1 TABLET BY MOUTH EVERY 6 HOURS AS NEEDED FOR PAIN  . isosorbide mononitrate (IMDUR) 60 MG 24 hr tablet Take 1 tablet (60 mg total) daily by mouth.  . latanoprost (XALATAN) 0.005 % ophthalmic solution Place 1 drop into both eyes at bedtime.  Marland Kitchen LUMIGAN 0.01 % SOLN USE 1 DROP AT BEDTIME INTO BOTH EYES  . metoprolol succinate (TOPROL-XL) 100 MG 24 hr tablet Take 1 tablet (100 mg total) daily by mouth. Take 1/2 tablet daily  . NITROSTAT 0.4 MG SL tablet Place 0.4 mg under the tongue every 5 (five) minutes as needed for chest pain.   Marland Kitchen PARoxetine (PAXIL) 40 MG tablet Take 1 tablet (40 mg total) by mouth daily.     1. Coronary artery disease involving native coronary artery of native heart with angina  pectoris with documented spasm [facility]) patient says she is doing well. Just saw cardiology in May of 2018, at which time no changes were made to care  2. Essential hypertension, benign  No c/oi chest pain, sob or headache. She does not check her blood pressure at home. BP Readings from Last 3 Encounters:  03/07/17 118/68  12/04/16 105/61  08/22/16 (!) 95/57     3. Gastroesophageal reflux disease without esophagitis  Currently only using OTC meds as needed.  4. Age related osteoporosis, unspecified pathological fracture presence  has chronic back pain and takes pain norco 5/325 bid. Which seems to help with pain.  5. Primary osteoarthritis of left hip  Chronic pain from arthritis  6. Arthritis of both knees  Are painful but doing ok.  7. BMI 30.0-30.9,adult  No recent weight changes  8. Recurrent major depressive disorder, in full remission (HCC)  Currently doing well on paxil. No medication side effects Depression screen [facility] 2/9 06/09/2017 12/04/2016 08/22/2016  Decreased Interest 0 0 0  Down, Depressed, Hopeless 0 0 0  PHQ - 2 Score 0 0 0     9. Hyperlipidemia with target LDL less than 100  Admits to not really watching diet  10. Opioid type dependence, continuous (HCC)  Currently on norco 5/325 bid. Registry no suspicious activity    New complaints: None today  Social history: Lives  with husband of is in pretty good health.    Review of Systems  Constitutional: Negative for activity change and appetite change.  HENT: Negative.   Eyes: Negative for pain.  Respiratory: Negative for shortness of breath.   Cardiovascular: Negative for chest pain, palpitations and leg swelling.  Gastrointestinal: Negative for abdominal pain.  Endocrine: Negative for polydipsia.  Genitourinary: Negative.   Skin: Negative for rash.  Neurological: Negative for dizziness, weakness and headaches.  Hematological: Does not bruise/bleed easily.  Psychiatric/Behavioral: Negative.   All other  systems reviewed and are negative.      Objective:   Physical Exam  Constitutional: She is oriented to person, place, and time. She appears well-developed and well-nourished.  HENT:  Nose: Nose normal.  Mouth/Throat: Oropharynx is clear and moist.  Eyes: EOM are normal.  Neck: Trachea normal, normal range of motion and full passive range of motion without pain. Neck supple. No JVD present. Carotid bruit is not present. No thyromegaly present.  Cardiovascular: Normal rate, regular rhythm, normal heart sounds and intact distal pulses. Exam reveals no gallop and no friction rub.  No murmur heard. Pulmonary/Chest: Effort normal and breath sounds normal.  Abdominal: Soft. Bowel sounds are normal. She exhibits no distension and no mass. There is no tenderness.  Musculoskeletal: Normal range of motion.  Mild bil knee effusion with crpitus on flexion and extension bil.  Left knee will not fully extend  Lymphadenopathy:    She has no cervical adenopathy.  Neurological: She is alert and oriented to person, place, and time. She has normal reflexes.  Skin: Skin is warm and dry.  Psychiatric: She has a normal mood and affect. Her behavior is normal. Judgment and thought content normal.    BP (!) 99/55   Pulse (!) 54   Temp (!) 97.3 F (36.3 C) (Oral)   Ht 5\' 4"  (1.626 m)   Wt 165 lb (74.8 kg)   BMI 28.32 kg/m       Assessment & Plan:  1. Coronary artery disease involving native coronary artery of native heart with angina pectoris with documented spasm (HCC) Keep follow up appointments with cardiology - isosorbide mononitrate (IMDUR) 60 MG 24 hr tablet; Take 1 tablet (60 mg total) by mouth daily.  Dispense: 90 tablet; Refill: 1 - metoprolol succinate (TOPROL-XL) 100 MG 24 hr tablet; Take 1 tablet (100 mg total) by mouth daily. Take 1/2 tablet daily  Dispense: 90 tablet; Refill: 1 - clopidogrel (PLAVIX) 75 MG tablet; Take 1 tablet (75 mg total) by mouth daily.  Dispense: 90 tablet; Refill:  1  2. Essential hypertension, benign Low sodium diet - benazepril (LOTENSIN) 40 MG tablet; Take 1 tablet (40 mg total) by mouth daily.  Dispense: 90 tablet; Refill: 1 - amLODipine (NORVASC) 10 MG tablet; Take 1 tablet (10 mg total) by mouth daily.  Dispense: 90 tablet; Refill: 1 - hydrochlorothiazide (HYDRODIURIL) 25 MG tablet; TAKE 1 TABLET (25 MG TOTAL) BY MOUTH DAILY.  Dispense: 90 tablet; Refill: 1  3. Gastroesophageal reflux disease without esophagitis Avoid spicy foods Do not eat 2 hours prior to bedtime  4. Age related osteoporosis, unspecified pathological fracture presence   5. Primary osteoarthritis of left hip  6. Arthritis of both knees   7. BMI 30.0-30.9,adult Discussed diet and exercise for person with BMI >25 Will recheck weight in 3-6 months  8. Recurrent major depressive disorder, in full remission (HCC) Stress management discussed - PARoxetine (PAXIL) 40 MG tablet; Take 1 tablet (40 mg total) by  mouth daily.  Dispense: 90 tablet; Refill: 1  9. Hyperlipidemia with target LDL less than 100 Low fat diet - atorvastatin (LIPITOR) 40 MG tablet; Take 1 tablet (40 mg total) by mouth daily at 6 PM.  Dispense: 90 tablet; Refill: 0  10. Opioid type dependence, continuous (HCC)  11. Osteoarthritis of left hip, unspecified osteoarthritis type - HYDROcodone-acetaminophen (NORCO/VICODIN) 5-325 MG tablet; Take 1 tablet by mouth every 6 (six) hours as needed for moderate pain.  Dispense: 60 tablet; Refill: 0 - HYDROcodone-acetaminophen (NORCO) 5-325 MG tablet; Take 1 tablet by mouth every 6 (six) hours as needed for moderate pain.  Dispense: 60 tablet; Refill: 0 - HYDROcodone-acetaminophen (NORCO/VICODIN) 5-325 MG tablet; TAKE 1 TABLET BY MOUTH EVERY 6 HOURS AS NEEDED FOR PAIN  Dispense: 60 tablet; Refill: 0    Labs pending Health maintenance reviewed Diet and exercise encouraged Continue all meds Follow up  In 3 months   Mary-Margaret Daphine Deutscher, FNP

## 2017-06-10 LAB — CMP14+EGFR
ALBUMIN: 4.2 g/dL (ref 3.5–4.8)
ALT: 13 IU/L (ref 0–32)
AST: 10 IU/L (ref 0–40)
Albumin/Globulin Ratio: 2 (ref 1.2–2.2)
Alkaline Phosphatase: 93 IU/L (ref 39–117)
BUN / CREAT RATIO: 22 (ref 12–28)
BUN: 21 mg/dL (ref 8–27)
Bilirubin Total: 0.5 mg/dL (ref 0.0–1.2)
CALCIUM: 9.8 mg/dL (ref 8.7–10.3)
CO2: 28 mmol/L (ref 20–29)
CREATININE: 0.97 mg/dL (ref 0.57–1.00)
Chloride: 100 mmol/L (ref 96–106)
GFR calc Af Amer: 66 mL/min/{1.73_m2} (ref >59)
GFR calc non Af Amer: 57 mL/min/{1.73_m2} — ABNORMAL LOW (ref >59)
GLOBULIN, TOTAL: 2.1 g/dL (ref 1.5–4.5)
GLUCOSE: 78 mg/dL (ref 65–99)
Potassium: 4.1 mmol/L (ref 3.5–5.2)
SODIUM: 143 mmol/L (ref 134–144)
Total Protein: 6.3 g/dL (ref 6.0–8.5)

## 2017-06-10 LAB — LIPID PANEL
CHOL/HDL RATIO: 2.2 ratio (ref 0.0–4.4)
Cholesterol, Total: 122 mg/dL (ref 100–199)
HDL: 55 mg/dL (ref >39)
LDL CALC: 45 mg/dL (ref 0–99)
TRIGLYCERIDES: 110 mg/dL (ref 0–149)
VLDL CHOLESTEROL CAL: 22 mg/dL (ref 5–40)

## 2017-06-11 ENCOUNTER — Encounter: Payer: Self-pay | Admitting: *Deleted

## 2017-08-11 ENCOUNTER — Telehealth: Payer: Self-pay | Admitting: Nurse Practitioner

## 2017-08-11 NOTE — Telephone Encounter (Signed)
Sent to [facility], pt aware that it would be 08/12/17 before she sees it

## 2017-08-12 NOTE — Telephone Encounter (Signed)
Patient aware that she should have enough refills at pharmacy and would need to be seen for any further refills.  Patient states that pharmacy states there was only 2 norco rxs sent to pharmacy, but will call pharmacy back to check

## 2017-08-12 NOTE — Telephone Encounter (Signed)
Patient was seen in feb and she got 3 rx with 60 each- ntbs for refill

## 2017-09-10 ENCOUNTER — Ambulatory Visit (INDEPENDENT_AMBULATORY_CARE_PROVIDER_SITE_OTHER): Payer: Medicare Other | Admitting: Nurse Practitioner

## 2017-09-10 ENCOUNTER — Encounter: Payer: Self-pay | Admitting: Nurse Practitioner

## 2017-09-10 VITALS — BP 112/67 | HR 52 | Temp 96.9°F | Ht 64.0 in | Wt 168.0 lb

## 2017-09-10 DIAGNOSIS — M1612 Unilateral primary osteoarthritis, left hip: Secondary | ICD-10-CM

## 2017-09-10 DIAGNOSIS — M810 Age-related osteoporosis without current pathological fracture: Secondary | ICD-10-CM | POA: Diagnosis not present

## 2017-09-10 DIAGNOSIS — M170 Bilateral primary osteoarthritis of knee: Secondary | ICD-10-CM

## 2017-09-10 DIAGNOSIS — K219 Gastro-esophageal reflux disease without esophagitis: Secondary | ICD-10-CM | POA: Diagnosis not present

## 2017-09-10 DIAGNOSIS — I10 Essential (primary) hypertension: Secondary | ICD-10-CM | POA: Diagnosis not present

## 2017-09-10 DIAGNOSIS — F3342 Major depressive disorder, recurrent, in full remission: Secondary | ICD-10-CM

## 2017-09-10 DIAGNOSIS — E785 Hyperlipidemia, unspecified: Secondary | ICD-10-CM

## 2017-09-10 DIAGNOSIS — I25111 Atherosclerotic heart disease of native coronary artery with angina pectoris with documented spasm: Secondary | ICD-10-CM

## 2017-09-10 DIAGNOSIS — Z6830 Body mass index (BMI) 30.0-30.9, adult: Secondary | ICD-10-CM | POA: Diagnosis not present

## 2017-09-10 DIAGNOSIS — F1120 Opioid dependence, uncomplicated: Secondary | ICD-10-CM

## 2017-09-10 LAB — CMP14+EGFR
A/G RATIO: 2 (ref 1.2–2.2)
ALBUMIN: 4.2 g/dL (ref 3.5–4.8)
ALT: 7 IU/L (ref 0–32)
AST: 9 IU/L (ref 0–40)
Alkaline Phosphatase: 100 IU/L (ref 39–117)
BILIRUBIN TOTAL: 0.5 mg/dL (ref 0.0–1.2)
BUN / CREAT RATIO: 18 (ref 12–28)
BUN: 17 mg/dL (ref 8–27)
CALCIUM: 9.7 mg/dL (ref 8.7–10.3)
CHLORIDE: 99 mmol/L (ref 96–106)
CO2: 28 mmol/L (ref 20–29)
Creatinine, Ser: 0.93 mg/dL (ref 0.57–1.00)
GFR, EST AFRICAN AMERICAN: 70 mL/min/{1.73_m2} (ref >59)
GFR, EST NON AFRICAN AMERICAN: 60 mL/min/{1.73_m2} (ref >59)
GLOBULIN, TOTAL: 2.1 g/dL (ref 1.5–4.5)
Glucose: 91 mg/dL (ref 65–99)
POTASSIUM: 3.9 mmol/L (ref 3.5–5.2)
SODIUM: 142 mmol/L (ref 134–144)
TOTAL PROTEIN: 6.3 g/dL (ref 6.0–8.5)

## 2017-09-10 LAB — LIPID PANEL
CHOL/HDL RATIO: 2.1 ratio (ref 0.0–4.4)
Cholesterol, Total: 125 mg/dL (ref 100–199)
HDL: 60 mg/dL (ref >39)
LDL Calculated: 43 mg/dL (ref 0–99)
Triglycerides: 109 mg/dL (ref 0–149)
VLDL Cholesterol Cal: 22 mg/dL (ref 5–40)

## 2017-09-10 MED ORDER — HYDROCODONE-ACETAMINOPHEN 5-325 MG PO TABS: ORAL_TABLET | ORAL | 0 refills | Status: DC

## 2017-09-10 MED ORDER — HYDROCODONE-ACETAMINOPHEN 5-325 MG PO TABS: 1.0000 | ORAL_TABLET | Freq: Four times a day (QID) | ORAL | 0 refills | Status: DC | PRN

## 2017-09-10 MED ORDER — HYDROCODONE-ACETAMINOPHEN 5-325 MG PO TABS
1.0000 | ORAL_TABLET | Freq: Four times a day (QID) | ORAL | 0 refills | Status: DC | PRN
Start: 2017-11-09 — End: 2017-12-09

## 2017-09-10 MED ORDER — ALENDRONATE SODIUM 70 MG PO TABS: 70.0000 mg | ORAL_TABLET | ORAL | 11 refills | Status: DC

## 2017-09-10 NOTE — Progress Notes (Signed)
 Subjective:    Patient ID: Teresa Holloway, female    DOB: 09/07/1940, 77 y.o.   MRN: [id number]   Chief Complaint: Medical Management of Chronic Issues   HPI:  1. Essential hypertension, benign  No c/o cest pain, sob or headaches. Does not check blood pressure at home. BP Readings from Last 3 Encounters:  09/10/17 112/67  06/09/17 (!) 99/55  03/07/17 118/68     2. Gastroesophageal reflux disease without esophagitis  Takes OTC zantac when needed. Has to take a couple times a week  3. Age related osteoporosis, unspecified pathological fracture presence  last dexa scan was in 2015 - patient refuses to have anymore dexascan  4. Arthritis of both knees  Both knees bother her daily. She is on norco 5/325 she takes BID. Does not completely take care of all of her pain but helps. Rate pain today 5/10.  5. Opioid type dependence, continuous (HCC)  She has been on pain meds for several years and cannot walk without taking due to knee pain.  6. Hyperlipidemia with target LDL less than 100  Does not watch diet  7. Recurrent major depressive disorder, in full remission (HCC)  Is on paxil daily which helps with her stress levels Depression screen PHQ 2/9 09/10/2017 06/09/2017 12/04/2016  Decreased Interest 0 0 0  Down, Depressed, Hopeless 0 0 0  PHQ - 2 Score 0 0 0     8. BMI 30.0-30.9,adult  No recent weight changes     Outpatient Encounter Medications as of 09/10/2017  Medication Sig  . amLODipine (NORVASC) 10 MG tablet Take 1 tablet (10 mg total) by mouth daily.  . aspirin 81 MG tablet Take 81 mg by mouth daily.  . atorvastatin (LIPITOR) 40 MG tablet Take 1 tablet (40 mg total) by mouth daily at 6 PM.  . benazepril (LOTENSIN) 40 MG tablet Take 1 tablet (40 mg total) by mouth daily.  . cholecalciferol (VITAMIN D) 1000 UNITS tablet Take 1,000 Units by mouth daily.  . clopidogrel (PLAVIX) 75 MG tablet Take 1 tablet (75 mg total) by mouth daily.  . [facility] RANITIDINE 75 MG tablet TAKE  1 TABLET (75 MG TOTAL) BY MOUTH DAILY.  . hydrochlorothiazide (HYDRODIURIL) 25 MG tablet TAKE 1 TABLET (25 MG TOTAL) BY MOUTH DAILY.  . HYDROcodone-acetaminophen (NORCO) 5-325 MG tablet Take 1 tablet by mouth every 6 (six) hours as needed for moderate pain.  . HYDROcodone-acetaminophen (NORCO/VICODIN) 5-325 MG tablet Take 1 tablet by mouth every 6 (six) hours as needed for moderate pain.  . HYDROcodone-acetaminophen (NORCO/VICODIN) 5-325 MG tablet TAKE 1 TABLET BY MOUTH EVERY 6 HOURS AS NEEDED FOR PAIN  . isosorbide mononitrate (IMDUR) 60 MG 24 hr tablet Take 1 tablet (60 mg total) by mouth daily.  . latanoprost (XALATAN) 0.005 % ophthalmic solution Place 1 drop into both eyes at bedtime.  . LUMIGAN 0.01 % SOLN USE 1 DROP AT BEDTIME INTO BOTH EYES  . metoprolol succinate (TOPROL-XL) 100 MG 24 hr tablet Take 1 tablet (100 mg total) by mouth daily. Take 1/2 tablet daily  . NITROSTAT 0.4 MG SL tablet Place 0.4 mg under the tongue every 5 (five) minutes as needed for chest pain.   . PARoxetine (PAXIL) 40 MG tablet Take 1 tablet (40 mg total) by mouth daily.     New complaints: No new complaints today  Social history: Husband is in hospital with kidney stones and is in a lot of pain   Review of Systems  Constitutional: Negative    Subjective:    Patient ID: Teresa Holloway, female    DOB: 09/07/1940, 77 y.o.   MRN: [id number]   Chief Complaint: Medical Management of Chronic Issues   HPI:  1. Essential hypertension, benign  No c/o cest pain, sob or headaches. Does not check blood pressure at home. BP Readings from Last 3 Encounters:  09/10/17 112/67  06/09/17 (!) 99/55  03/07/17 118/68     2. Gastroesophageal reflux disease without esophagitis  Takes OTC zantac when needed. Has to take a couple times a week  3. Age related osteoporosis, unspecified pathological fracture presence  last dexa scan was in 2015 - patient refuses to have anymore dexascan  4. Arthritis of both knees  Both knees bother her daily. She is on norco 5/325 she takes BID. Does not completely take care of all of her pain but helps. Rate pain today 5/10.  5. Opioid type dependence, continuous (HCC)  She has been on pain meds for several years and cannot walk without taking due to knee pain.  6. Hyperlipidemia with target LDL less than 100  Does not watch diet  7. Recurrent major depressive disorder, in full remission (HCC)  Is on paxil daily which helps with her stress levels Depression screen PHQ 2/9 09/10/2017 06/09/2017 12/04/2016  Decreased Interest 0 0 0  Down, Depressed, Hopeless 0 0 0  PHQ - 2 Score 0 0 0     8. BMI 30.0-30.9,adult  No recent weight changes     Outpatient Encounter Medications as of 09/10/2017  Medication Sig  . amLODipine (NORVASC) 10 MG tablet Take 1 tablet (10 mg total) by mouth daily.  . aspirin 81 MG tablet Take 81 mg by mouth daily.  . atorvastatin (LIPITOR) 40 MG tablet Take 1 tablet (40 mg total) by mouth daily at 6 PM.  . benazepril (LOTENSIN) 40 MG tablet Take 1 tablet (40 mg total) by mouth daily.  . cholecalciferol (VITAMIN D) 1000 UNITS tablet Take 1,000 Units by mouth daily.  . clopidogrel (PLAVIX) 75 MG tablet Take 1 tablet (75 mg total) by mouth daily.  . [facility] RANITIDINE 75 MG tablet TAKE  1 TABLET (75 MG TOTAL) BY MOUTH DAILY.  . hydrochlorothiazide (HYDRODIURIL) 25 MG tablet TAKE 1 TABLET (25 MG TOTAL) BY MOUTH DAILY.  . HYDROcodone-acetaminophen (NORCO) 5-325 MG tablet Take 1 tablet by mouth every 6 (six) hours as needed for moderate pain.  . HYDROcodone-acetaminophen (NORCO/VICODIN) 5-325 MG tablet Take 1 tablet by mouth every 6 (six) hours as needed for moderate pain.  . HYDROcodone-acetaminophen (NORCO/VICODIN) 5-325 MG tablet TAKE 1 TABLET BY MOUTH EVERY 6 HOURS AS NEEDED FOR PAIN  . isosorbide mononitrate (IMDUR) 60 MG 24 hr tablet Take 1 tablet (60 mg total) by mouth daily.  . latanoprost (XALATAN) 0.005 % ophthalmic solution Place 1 drop into both eyes at bedtime.  . LUMIGAN 0.01 % SOLN USE 1 DROP AT BEDTIME INTO BOTH EYES  . metoprolol succinate (TOPROL-XL) 100 MG 24 hr tablet Take 1 tablet (100 mg total) by mouth daily. Take 1/2 tablet daily  . NITROSTAT 0.4 MG SL tablet Place 0.4 mg under the tongue every 5 (five) minutes as needed for chest pain.   . PARoxetine (PAXIL) 40 MG tablet Take 1 tablet (40 mg total) by mouth daily.     New complaints: No new complaints today  Social history: Husband is in hospital with kidney stones and is in a lot of pain   Review of Systems  Constitutional: Negative

## 2017-09-10 NOTE — Patient Instructions (Signed)
Bone Health Bones protect organs, store calcium, and anchor muscles. Good health habits, such as eating nutritious foods and exercising regularly, are important for maintaining healthy bones. They can also help to prevent a condition that causes bones to lose density and become weak and brittle (osteoporosis). Why is bone mass important? Bone mass refers to the amount of bone tissue that you have. The higher your bone mass, the stronger your bones. An important step toward having healthy bones throughout life is to have strong and dense bones during childhood. A young adult who has a high bone mass is more likely to have a high bone mass later in life. Bone mass at its greatest it is called peak bone mass. A large decline in bone mass occurs in older adults. In women, it occurs about the time of menopause. During this time, it is important to practice good health habits, because if more bone is lost than what is replaced, the bones will become less healthy and more likely to break (fracture). If you find that you have a low bone mass, you may be able to prevent osteoporosis or further bone loss by changing your diet and lifestyle. How can I find out if my bone mass is low? Bone mass can be measured with an X-ray test that is called a bone mineral density (BMD) test. This test is recommended for all women who are age 65 or older. It may also be recommended for men who are age 70 or older, or for people who are more likely to develop osteoporosis due to:  Having bones that break easily.  Having a long-term disease that weakens bones, such as kidney disease or rheumatoid arthritis.  Having menopause earlier than normal.  Taking medicine that weakens bones, such as steroids, thyroid hormones, or hormone treatment for breast cancer or prostate cancer.  Smoking.  Drinking three or more alcoholic drinks each day.  What are the nutritional recommendations for healthy bones? To have healthy bones, you  need to get enough of the right minerals and vitamins. Most nutrition experts recommend getting these nutrients from the foods that you eat. Nutritional recommendations vary from person to person. Ask your health care provider what is healthy for you. Here are some general guidelines. Calcium Recommendations Calcium is the most important (essential) mineral for bone health. Most people can get enough calcium from their diet, but supplements may be recommended for people who are at risk for osteoporosis. Good sources of calcium include:  Dairy products, such as low-fat or nonfat milk, cheese, and yogurt.  Dark green leafy vegetables, such as bok choy and broccoli.  Calcium-fortified foods, such as orange juice, cereal, bread, soy beverages, and tofu products.  Nuts, such as almonds.  Follow these recommended amounts for daily calcium intake:  Children, age 1?3: 700 mg.  Children, age 4?8: 1,000 mg.  Children, age 9?13: 1,300 mg.  Teens, age 14?18: 1,300 mg.  Adults, age 19?50: 1,000 mg.  Adults, age 51?70: ? Men: 1,000 mg. ? Women: 1,200 mg.  Adults, age 71 or older: 1,200 mg.  Pregnant and breastfeeding females: ? Teens: 1,300 mg. ? Adults: 1,000 mg.  Vitamin D Recommendations Vitamin D is the most essential vitamin for bone health. It helps the body to absorb calcium. Sunlight stimulates the skin to make vitamin D, so be sure to get enough sunlight. If you live in a cold climate or you do not get outside often, your health care provider may recommend that you take vitamin   D supplements. Good sources of vitamin D in your diet include:  Egg yolks.  Saltwater fish.  Milk and cereal fortified with vitamin D.  Follow these recommended amounts for daily vitamin D intake:  Children and teens, age 1?18: 600 international units.  Adults, age 50 or younger: 400-800 international units.  Adults, age 51 or older: 800-1,000 international units.  Other Nutrients Other nutrients  for bone health include:  Phosphorus. This mineral is found in meat, poultry, dairy foods, nuts, and legumes. The recommended daily intake for adult men and adult women is 700 mg.  Magnesium. This mineral is found in seeds, nuts, dark green vegetables, and legumes. The recommended daily intake for adult men is 400?420 mg. For adult women, it is 310?320 mg.  Vitamin K. This vitamin is found in green leafy vegetables. The recommended daily intake is 120 mg for adult men and 90 mg for adult women.  What type of physical activity is best for building and maintaining healthy bones? Weight-bearing and strength-building activities are important for building and maintaining peak bone mass. Weight-bearing activities cause muscles and bones to work against gravity. Strength-building activities increases muscle strength that supports bones. Weight-bearing and muscle-building activities include:  Walking and hiking.  Jogging and running.  Dancing.  Gym exercises.  Lifting weights.  Tennis and racquetball.  Climbing stairs.  Aerobics.  Adults should get at least 30 minutes of moderate physical activity on most days. Children should get at least 60 minutes of moderate physical activity on most days. Ask your health care provide what type of exercise is best for you. Where can I find more information? For more information, check out the following websites:  National Osteoporosis Foundation: http://nof.org/learn/basics  National Institutes of Health: http://www.niams.nih.gov/Health_Info/Bone/Bone_Health/bone_health_for_life.asp  This information is not intended to replace advice given to you by your health care provider. Make sure you discuss any questions you have with your health care provider. Document Released: 06/29/2003 Document Revised: 10/27/2015 Document Reviewed: 04/13/2014 Elsevier Interactive Patient Education  2018 Elsevier Inc.  

## 2017-09-17 DIAGNOSIS — R001 Bradycardia, unspecified: Secondary | ICD-10-CM | POA: Diagnosis not present

## 2017-09-17 DIAGNOSIS — Z7901 Long term (current) use of anticoagulants: Secondary | ICD-10-CM | POA: Diagnosis not present

## 2017-09-17 DIAGNOSIS — I25119 Atherosclerotic heart disease of native coronary artery with unspecified angina pectoris: Secondary | ICD-10-CM | POA: Diagnosis not present

## 2017-09-17 DIAGNOSIS — I252 Old myocardial infarction: Secondary | ICD-10-CM | POA: Diagnosis not present

## 2017-09-17 DIAGNOSIS — Z7902 Long term (current) use of antithrombotics/antiplatelets: Secondary | ICD-10-CM | POA: Diagnosis not present

## 2017-09-17 DIAGNOSIS — R9431 Abnormal electrocardiogram [ECG] [EKG]: Secondary | ICD-10-CM | POA: Diagnosis not present

## 2017-09-17 DIAGNOSIS — Z7982 Long term (current) use of aspirin: Secondary | ICD-10-CM | POA: Diagnosis not present

## 2017-09-17 DIAGNOSIS — I25118 Atherosclerotic heart disease of native coronary artery with other forms of angina pectoris: Secondary | ICD-10-CM | POA: Diagnosis not present

## 2017-10-30 ENCOUNTER — Ambulatory Visit (INDEPENDENT_AMBULATORY_CARE_PROVIDER_SITE_OTHER): Payer: Medicare Other | Admitting: Nurse Practitioner

## 2017-10-30 ENCOUNTER — Encounter: Payer: Self-pay | Admitting: Nurse Practitioner

## 2017-10-30 VITALS — BP 104/59 | HR 61 | Temp 97.7°F | Ht 64.0 in | Wt 160.0 lb

## 2017-10-30 DIAGNOSIS — I25111 Atherosclerotic heart disease of native coronary artery with angina pectoris with documented spasm: Secondary | ICD-10-CM

## 2017-10-30 DIAGNOSIS — R300 Dysuria: Secondary | ICD-10-CM | POA: Diagnosis not present

## 2017-10-30 DIAGNOSIS — R490 Dysphonia: Secondary | ICD-10-CM | POA: Diagnosis not present

## 2017-10-30 DIAGNOSIS — N3000 Acute cystitis without hematuria: Secondary | ICD-10-CM | POA: Diagnosis not present

## 2017-10-30 LAB — URINALYSIS, COMPLETE
Nitrite, UA: POSITIVE — AB
pH, UA: 5 (ref 5.0–7.5)

## 2017-10-30 LAB — MICROSCOPIC EXAMINATION: RENAL EPITHEL UA: NONE SEEN /HPF

## 2017-10-30 MED ORDER — CIPROFLOXACIN HCL 500 MG PO TABS
500.0000 mg | ORAL_TABLET | Freq: Two times a day (BID) | ORAL | 0 refills | Status: DC
Start: 2017-10-30 — End: 2017-12-11

## 2017-10-30 NOTE — Progress Notes (Signed)
Subjective:    Patient ID: Teresa Holloway, female    DOB: July 16, 1940, 77 y.o.   MRN: [id number]   Chief Complaint: Dysuria and Nasal Congestion   HPI - patient comes in today c/o dysuria and frequency that started yesterday. She is voiding small amounts when she goes and des not feel like she is emptying her bladder. She started AZO which has helped with symptoms. - was seen several weeks ago with sinusitis. She say she is still very hoarse.   Review of Systems  Constitutional: Negative for chills and fever.  HENT: Positive for voice change. Negative for congestion, rhinorrhea, sinus pressure, sinus pain, sore throat and trouble swallowing.   Respiratory: Negative for cough.   Cardiovascular: Negative.   Gastrointestinal: Negative.   Genitourinary: Positive for dysuria, frequency and urgency. Negative for pelvic pain.  All other systems reviewed and are negative.      Objective:   Physical Exam  Constitutional: She is oriented to person, place, and time. She appears well-developed and well-nourished. No distress.  HENT:  Right Ear: External ear normal.  Left Ear: External ear normal.  Nose: Nose normal.  Mouth/Throat: Oropharynx is clear and moist. No oropharyngeal exudate.  Voice hoarse   Eyes: Pupils are equal, round, and reactive to light.  Neck: Normal range of motion. Neck supple.  Cardiovascular: Normal rate.  Pulmonary/Chest: Effort normal.  Abdominal: Soft. Bowel sounds are normal. There is no tenderness.  Genitourinary:  Genitourinary Comments: No CVA tenderness  Lymphadenopathy:    She has no cervical adenopathy.  Neurological: She is alert and oriented to person, place, and time.  Skin: Skin is warm and dry.  Psychiatric: She has a normal mood and affect. Her behavior is normal. Thought content normal.    BP (!) 104/59   Pulse 61   Temp 97.7 F (36.5 C) (Oral)   Ht 5\' 4"  (1.626 m)   Wt 160 lb (72.6 kg)   BMI 27.46 kg/m   Urine  Pos nitrites  3+  leuks   >30 WBC      Assessment & Plan:  Teresa Holloway in today with chief complaint of Dysuria and Nasal Congestion   1. Dysuria - Urinalysis, Complete  2. Acute cystitis without hematuria Take medication as prescribe Cotton underwear Take shower not bath Cranberry juice, yogurt Force fluids AZO over the counter X2 days Culture pending RTO prn  - ciprofloxacin (CIPRO) 500 MG tablet; Take 1 tablet (500 mg total) by mouth 2 (two) times daily.  Dispense: 10 tablet; Refill: 0 - Urine Culture  3. Hoarse flonase or claritin OTC   Mary-Margaret Daphine Deutscher, FNP

## 2017-10-30 NOTE — Patient Instructions (Signed)
Take medication as prescribe Cotton underwear Take shower not bath Cranberry juice, yogurt Force fluids AZO over the counter X2 days Culture pending RTO prn  

## 2017-11-01 LAB — URINE CULTURE

## 2017-11-02 ENCOUNTER — Other Ambulatory Visit: Payer: Self-pay | Admitting: Nurse Practitioner

## 2017-11-02 DIAGNOSIS — E785 Hyperlipidemia, unspecified: Secondary | ICD-10-CM

## 2017-12-06 ENCOUNTER — Other Ambulatory Visit: Payer: Self-pay | Admitting: Nurse Practitioner

## 2017-12-06 DIAGNOSIS — I10 Essential (primary) hypertension: Secondary | ICD-10-CM

## 2017-12-11 ENCOUNTER — Telehealth: Payer: Self-pay | Admitting: Nurse Practitioner

## 2017-12-11 ENCOUNTER — Encounter: Payer: Self-pay | Admitting: Nurse Practitioner

## 2017-12-11 ENCOUNTER — Ambulatory Visit (INDEPENDENT_AMBULATORY_CARE_PROVIDER_SITE_OTHER): Payer: Medicare Other | Admitting: Nurse Practitioner

## 2017-12-11 VITALS — BP 104/60 | HR 54 | Temp 97.6°F | Ht 64.0 in | Wt 160.5 lb

## 2017-12-11 DIAGNOSIS — Z0289 Encounter for other administrative examinations: Secondary | ICD-10-CM

## 2017-12-11 DIAGNOSIS — I10 Essential (primary) hypertension: Secondary | ICD-10-CM

## 2017-12-11 DIAGNOSIS — F3342 Major depressive disorder, recurrent, in full remission: Secondary | ICD-10-CM

## 2017-12-11 DIAGNOSIS — M810 Age-related osteoporosis without current pathological fracture: Secondary | ICD-10-CM

## 2017-12-11 DIAGNOSIS — K219 Gastro-esophageal reflux disease without esophagitis: Secondary | ICD-10-CM

## 2017-12-11 DIAGNOSIS — F1120 Opioid dependence, uncomplicated: Secondary | ICD-10-CM

## 2017-12-11 DIAGNOSIS — I25111 Atherosclerotic heart disease of native coronary artery with angina pectoris with documented spasm: Secondary | ICD-10-CM | POA: Diagnosis not present

## 2017-12-11 DIAGNOSIS — Z6830 Body mass index (BMI) 30.0-30.9, adult: Secondary | ICD-10-CM

## 2017-12-11 DIAGNOSIS — E785 Hyperlipidemia, unspecified: Secondary | ICD-10-CM

## 2017-12-11 DIAGNOSIS — M1612 Unilateral primary osteoarthritis, left hip: Secondary | ICD-10-CM

## 2017-12-11 MED ORDER — ISOSORBIDE MONONITRATE ER 60 MG PO TB24: 60.0000 mg | ORAL_TABLET | Freq: Every day | ORAL | 1 refills | Status: DC

## 2017-12-11 MED ORDER — AMLODIPINE BESYLATE 10 MG PO TABS: 10.0000 mg | ORAL_TABLET | Freq: Every day | ORAL | 1 refills | Status: DC

## 2017-12-11 MED ORDER — HYDROCODONE-ACETAMINOPHEN 5-325 MG PO TABS: 1.0000 | ORAL_TABLET | Freq: Two times a day (BID) | ORAL | 0 refills | Status: DC

## 2017-12-11 MED ORDER — HYDROCHLOROTHIAZIDE 25 MG PO TABS: ORAL_TABLET | ORAL | 0 refills | Status: DC

## 2017-12-11 MED ORDER — PAROXETINE HCL 40 MG PO TABS: 40.0000 mg | ORAL_TABLET | Freq: Every day | ORAL | 1 refills | Status: DC

## 2017-12-11 MED ORDER — CLOPIDOGREL BISULFATE 75 MG PO TABS: 75.0000 mg | ORAL_TABLET | Freq: Every day | ORAL | 1 refills | Status: DC

## 2017-12-11 MED ORDER — ATORVASTATIN CALCIUM 40 MG PO TABS: 40.0000 mg | ORAL_TABLET | Freq: Every day | ORAL | 0 refills | Status: DC

## 2017-12-11 MED ORDER — BENAZEPRIL HCL 40 MG PO TABS: 40.0000 mg | ORAL_TABLET | Freq: Every day | ORAL | 1 refills | Status: DC

## 2017-12-11 MED ORDER — METOPROLOL SUCCINATE ER 100 MG PO TB24: 100.0000 mg | ORAL_TABLET | Freq: Every day | ORAL | 1 refills | Status: DC

## 2017-12-11 NOTE — Patient Instructions (Signed)

## 2017-12-11 NOTE — Progress Notes (Signed)
Subjective:    Patient ID: Teresa Holloway, female    DOB: 29-Aug-1940, 77 y.o.   MRN: [id number]   Chief Complaint: Medical Management of Chronic Issues   HPI:  1. Essential hypertension, benign  No c/o chest pain, sob or headache. Does not check blood pressure at home. BP Readings from Last 3 Encounters:  12/11/17 104/60  10/30/17 (!) 104/59  09/10/17 112/67     2. Coronary artery disease involving native coronary artery of native heart with angina pectoris with documented spasm [facility]) saw cardiology on 09/17/17. According to office note, no changes were made to plan of care..  3. Gastroesophageal reflux disease without esophagitis / Is currently on no meds for this. Just controls with diet.  4. Age related osteoporosis, unspecified pathological fracture presence  last BMD test was done on 06/23/13 with t score of -2.5. She takes fosamax weekly but does not weight bearing exercise due to arthritis pain.  5. Pain management contract agreement - 03/20/17  6. Opioid type dependence, continuous (HCC)  Pain assessment: Cause of pain- osteoarthritis Pain location- worse in left knee right now Pain on scale of 1-10- 7-9/10 Frequency- daily What increases pain-lots of standing or walking What makes pain Better-sitting Effects on ADL - some days hurts so bad it is hard to get things done Any change in general medical condition-none  Current medications- norco 5/325 bid Effectiveness of current meds-helps but never completely relieves pain Adverse reactions form pain meds-none Morphine equivalent 15  Pill count performed-No Urine drug screen- No Was the NCCSR reviewed- yes  If yes were their any concerning findings? - no  Pain contract signed on:03/20/17   7. Hyperlipidemia with target LDL less than 100  Low fat diet  8. Recurrent major depressive disorder, in full remission (HCC)  Takes paxil daily which works well for her.  Depression screen [facility] 2/9 12/11/2017 10/30/2017  09/10/2017  Decreased Interest 0 0 0  Down, Depressed, Hopeless 0 0 0  PHQ - 2 Score 0 0 0     9. BMI 27.0-27.9,adult  No recent weight changes  10. Primary osteoarthritis of left hip  Not bothering her as much as her knee is right now.    Outpatient Encounter Medications as of 12/11/2017  Medication Sig  . alendronate (FOSAMAX) 70 MG tablet Take 1 tablet (70 mg total) by mouth every 7 (seven) days. Take with a full glass of water on an empty stomach.  Marland Kitchen amLODipine (NORVASC) 10 MG tablet Take 1 tablet (10 mg total) by mouth daily.  Marland Kitchen aspirin 81 MG tablet Take 81 mg by mouth daily.  Marland Kitchen atorvastatin (LIPITOR) 40 MG tablet TAKE 1 TABLET (40 MG TOTAL) BY MOUTH DAILY AT 6 PM.  . benazepril (LOTENSIN) 40 MG tablet Take 1 tablet (40 mg total) by mouth daily.  . cholecalciferol (VITAMIN D) 1000 UNITS tablet Take 1,000 Units by mouth daily.  . ciprofloxacin (CIPRO) 500 MG tablet Take 1 tablet (500 mg total) by mouth 2 (two) times daily.  . clopidogrel (PLAVIX) 75 MG tablet Take 1 tablet (75 mg total) by mouth daily.  . [facility] RANITIDINE 75 MG tablet TAKE 1 TABLET (75 MG TOTAL) BY MOUTH DAILY.  . hydrochlorothiazide (HYDRODIURIL) 25 MG tablet TAKE 1 TABLET BY MOUTH EVERY DAY  . isosorbide mononitrate (IMDUR) 60 MG 24 hr tablet Take 1 tablet (60 mg total) by mouth daily.  Marland Kitchen latanoprost (XALATAN) 0.005 % ophthalmic solution Place 1 drop into both eyes at bedtime.  Marland Kitchen  LUMIGAN 0.01 % SOLN USE 1 DROP AT BEDTIME INTO BOTH EYES  . metoprolol succinate (TOPROL-XL) 100 MG 24 hr tablet Take 1 tablet (100 mg total) by mouth daily. Take 1/2 tablet daily  . NITROSTAT 0.4 MG SL tablet Place 0.4 mg under the tongue every 5 (five) minutes as needed for chest pain.   Marland Kitchen PARoxetine (PAXIL) 40 MG tablet Take 1 tablet (40 mg total) by mouth daily.     New complaints: None today  Social history: Needs total kne replacement but is scared to do so.   Review of Systems  Constitutional: Negative for activity change  and appetite change.  HENT: Negative.   Eyes: Negative for pain.  Respiratory: Negative for shortness of breath.   Cardiovascular: Negative for chest pain, palpitations and leg swelling.  Gastrointestinal: Negative for abdominal pain.  Endocrine: Negative for polydipsia.  Genitourinary: Negative.   Skin: Negative for rash.  Neurological: Negative for dizziness, weakness and headaches.  Hematological: Does not bruise/bleed easily.  Psychiatric/Behavioral: Negative.   All other systems reviewed and are negative.      Objective:   Physical Exam  Constitutional: She is oriented to person, place, and time. She appears well-developed and well-nourished.  HENT:  Head: Normocephalic.  Nose: Nose normal.  Mouth/Throat: Oropharynx is clear and moist.  Eyes: Pupils are equal, round, and reactive to light. EOM are normal.  Neck: Normal range of motion. Neck supple. No JVD present. Carotid bruit is not present.  Cardiovascular: Normal rate, regular rhythm, normal heart sounds and intact distal pulses.  Pulmonary/Chest: Effort normal and breath sounds normal. No respiratory distress. She has no wheezes. She has no rales. She exhibits no tenderness.  Abdominal: Soft. Normal appearance, normal aorta and bowel sounds are normal. She exhibits no distension, no abdominal bruit, no pulsatile midline mass and no mass. There is no splenomegaly or hepatomegaly. There is no tenderness.  Musculoskeletal: Normal range of motion. She exhibits no edema.  Lymphadenopathy:    She has no cervical adenopathy.  Neurological: She is alert and oriented to person, place, and time. She has normal reflexes.  Skin: Skin is warm and dry.  Psychiatric: She has a normal mood and affect. Her behavior is normal. Judgment and thought content normal.   BP 104/60   Pulse (!) 54   Temp 97.6 F (36.4 C) (Oral)   Ht 5\' 4"  (1.626 m)   Wt 160 lb 8 oz (72.8 kg)   BMI 27.55 kg/m          Assessment & Plan:  Teresa Holloway comes in today with chief complaint of Medical Management of Chronic Issues   Diagnosis and orders addressed:  1. Essential hypertension, benign Low sodium diet - hydrochlorothiazide (HYDRODIURIL) 25 MG tablet; TAKE 1 TABLET BY MOUTH EVERY DAY  Dispense: 90 tablet; Refill: 0 - benazepril (LOTENSIN) 40 MG tablet; Take 1 tablet (40 mg total) by mouth daily.  Dispense: 90 tablet; Refill: 1 - amLODipine (NORVASC) 10 MG tablet; Take 1 tablet (10 mg total) by mouth daily.  Dispense: 90 tablet; Refill: 1 - CMP14+EGFR  2. Coronary artery disease involving native coronary artery of native heart with angina pectoris with documented spasm (HCC) Keep yearly follow up with cardiology - clopidogrel (PLAVIX) 75 MG tablet; Take 1 tablet (75 mg total) by mouth daily.  Dispense: 90 tablet; Refill: 1 - isosorbide mononitrate (IMDUR) 60 MG 24 hr tablet; Take 1 tablet (60 mg total) by mouth daily.  Dispense: 90 tablet; Refill: 1 -  metoprolol succinate (TOPROL-XL) 100 MG 24 hr tablet; Take 1 tablet (100 mg total) by mouth daily. Take 1/2 tablet daily  Dispense: 90 tablet; Refill: 1  3. Gastroesophageal reflux disease without esophagitis Avoid spicy foods Do not eat 2 hours prior to bedtime  4. Age related osteoporosis, unspecified pathological fracture presence Weight bearing exercise encouraged Refuses repeat dexascan today  5. Pain management contract agreement  6. Opioid type dependence, continuous (HCC) - HYDROcodone-acetaminophen (NORCO) 5-325 MG tablet; Take 1 tablet by mouth 2 (two) times daily.  Dispense: 60 tablet; Refill: 0 - HYDROcodone-acetaminophen (NORCO) 5-325 MG tablet; Take 1 tablet by mouth 2 (two) times daily.  Dispense: 60 tablet; Refill: 0 - HYDROcodone-acetaminophen (NORCO) 5-325 MG tablet; Take 1 tablet by mouth 2 (two) times daily.  Dispense: 60 tablet; Refill: 0  7. Hyperlipidemia with target LDL less than 100 Low fat diet - atorvastatin (LIPITOR) 40 MG tablet; Take 1  tablet (40 mg total) by mouth daily at 6 PM.  Dispense: 90 tablet; Refill: 0 - Lipid panel  8. Recurrent major depressive disorder, in full remission (HCC) stres management - PARoxetine (PAXIL) 40 MG tablet; Take 1 tablet (40 mg total) by mouth daily.  Dispense: 90 tablet; Refill: 1  9. BMI 27.0-27.9,adult Discussed diet and exercise for person with BMI >25 Will recheck weight in 3-6 months  10. Primary osteoarthritis of left hip/knee See ortho Encouraged to have total knee replacement so can try to wean off pain meds   Labs pending Health Maintenance reviewed Diet and exercise encouraged  Follow up plan: 3 months   Mary-Margaret Daphine Deutscher, FNP

## 2017-12-12 LAB — LIPID PANEL
CHOL/HDL RATIO: 1.9 ratio (ref 0.0–4.4)
Cholesterol, Total: 119 mg/dL (ref 100–199)
HDL: 64 mg/dL (ref >39)
LDL Calculated: 34 mg/dL (ref 0–99)
Triglycerides: 106 mg/dL (ref 0–149)
VLDL CHOLESTEROL CAL: 21 mg/dL (ref 5–40)

## 2017-12-12 LAB — CMP14+EGFR
A/G RATIO: 1.8 (ref 1.2–2.2)
ALBUMIN: 4.1 g/dL (ref 3.5–4.8)
ALT: 8 IU/L (ref 0–32)
AST: 9 IU/L (ref 0–40)
Alkaline Phosphatase: 82 IU/L (ref 39–117)
BILIRUBIN TOTAL: 0.5 mg/dL (ref 0.0–1.2)
BUN / CREAT RATIO: 18 (ref 12–28)
BUN: 18 mg/dL (ref 8–27)
CALCIUM: 9.7 mg/dL (ref 8.7–10.3)
CO2: 30 mmol/L — ABNORMAL HIGH (ref 20–29)
Chloride: 99 mmol/L (ref 96–106)
Creatinine, Ser: 0.98 mg/dL (ref 0.57–1.00)
GFR calc Af Amer: 65 mL/min/{1.73_m2} (ref >59)
GFR, EST NON AFRICAN AMERICAN: 57 mL/min/{1.73_m2} — AB (ref >59)
Globulin, Total: 2.3 g/dL (ref 1.5–4.5)
Glucose: 83 mg/dL (ref 65–99)
POTASSIUM: 3.9 mmol/L (ref 3.5–5.2)
Sodium: 141 mmol/L (ref 134–144)
TOTAL PROTEIN: 6.4 g/dL (ref 6.0–8.5)

## 2017-12-12 MED ORDER — METOPROLOL SUCCINATE ER 100 MG PO TB24: ORAL_TABLET | ORAL | 1 refills | Status: DC

## 2017-12-12 NOTE — Telephone Encounter (Signed)
Heart doctor has her on 1/2 tablet daily- patient say sshe did not call so must have been pharmacy

## 2017-12-12 NOTE — Telephone Encounter (Signed)
[facility] is to be taken 1/2 tablet daily

## 2017-12-16 ENCOUNTER — Other Ambulatory Visit: Payer: Self-pay | Admitting: Nurse Practitioner

## 2017-12-16 DIAGNOSIS — F1120 Opioid dependence, uncomplicated: Secondary | ICD-10-CM

## 2017-12-16 MED ORDER — HYDROCODONE-ACETAMINOPHEN 5-325 MG PO TABS: 1.0000 | ORAL_TABLET | Freq: Two times a day (BID) | ORAL | 0 refills | Status: DC

## 2018-01-28 ENCOUNTER — Ambulatory Visit: Payer: Medicare Other | Admitting: Physician Assistant

## 2018-01-29 DIAGNOSIS — M1611 Unilateral primary osteoarthritis, right hip: Secondary | ICD-10-CM | POA: Diagnosis not present

## 2018-01-29 DIAGNOSIS — M1712 Unilateral primary osteoarthritis, left knee: Secondary | ICD-10-CM | POA: Diagnosis not present

## 2018-01-29 DIAGNOSIS — M1612 Unilateral primary osteoarthritis, left hip: Secondary | ICD-10-CM | POA: Diagnosis not present

## 2018-01-31 ENCOUNTER — Ambulatory Visit: Payer: Medicare Other | Admitting: Nurse Practitioner

## 2018-02-03 DIAGNOSIS — D0471 Carcinoma in situ of skin of right lower limb, including hip: Secondary | ICD-10-CM | POA: Diagnosis not present

## 2018-02-03 DIAGNOSIS — C44722 Squamous cell carcinoma of skin of right lower limb, including hip: Secondary | ICD-10-CM | POA: Diagnosis not present

## 2018-02-06 ENCOUNTER — Telehealth: Payer: Self-pay | Admitting: Nurse Practitioner

## 2018-02-06 NOTE — Telephone Encounter (Signed)
Patient aware that she should have [facility].  Attempted to contact pharmacy, no answer at this time

## 2018-02-09 ENCOUNTER — Telehealth: Payer: Self-pay | Admitting: Nurse Practitioner

## 2018-02-09 NOTE — Telephone Encounter (Signed)
Called and spoke with [facility].  Hydrocodone Rx at pharmacy.  Patient aware.

## 2018-03-13 ENCOUNTER — Encounter (INDEPENDENT_AMBULATORY_CARE_PROVIDER_SITE_OTHER): Payer: Self-pay

## 2018-03-13 ENCOUNTER — Encounter: Payer: Self-pay | Admitting: Nurse Practitioner

## 2018-03-13 ENCOUNTER — Ambulatory Visit (INDEPENDENT_AMBULATORY_CARE_PROVIDER_SITE_OTHER): Payer: Medicare Other | Admitting: Nurse Practitioner

## 2018-03-13 VITALS — BP 108/62 | HR 56 | Temp 97.8°F | Ht 64.0 in | Wt 166.0 lb

## 2018-03-13 DIAGNOSIS — K219 Gastro-esophageal reflux disease without esophagitis: Secondary | ICD-10-CM

## 2018-03-13 DIAGNOSIS — I25111 Atherosclerotic heart disease of native coronary artery with angina pectoris with documented spasm: Secondary | ICD-10-CM

## 2018-03-13 DIAGNOSIS — Z6830 Body mass index (BMI) 30.0-30.9, adult: Secondary | ICD-10-CM

## 2018-03-13 DIAGNOSIS — F1120 Opioid dependence, uncomplicated: Secondary | ICD-10-CM

## 2018-03-13 DIAGNOSIS — E785 Hyperlipidemia, unspecified: Secondary | ICD-10-CM | POA: Diagnosis not present

## 2018-03-13 DIAGNOSIS — F3342 Major depressive disorder, recurrent, in full remission: Secondary | ICD-10-CM

## 2018-03-13 DIAGNOSIS — I10 Essential (primary) hypertension: Secondary | ICD-10-CM

## 2018-03-13 DIAGNOSIS — M1612 Unilateral primary osteoarthritis, left hip: Secondary | ICD-10-CM

## 2018-03-13 DIAGNOSIS — Z0289 Encounter for other administrative examinations: Secondary | ICD-10-CM

## 2018-03-13 DIAGNOSIS — Z23 Encounter for immunization: Secondary | ICD-10-CM

## 2018-03-13 DIAGNOSIS — M810 Age-related osteoporosis without current pathological fracture: Secondary | ICD-10-CM

## 2018-03-13 MED ORDER — HYDROCODONE-ACETAMINOPHEN 5-325 MG PO TABS: 1.0000 | ORAL_TABLET | Freq: Four times a day (QID) | ORAL | 0 refills | Status: DC | PRN

## 2018-03-13 MED ORDER — HYDROCODONE-ACETAMINOPHEN 5-325 MG PO TABS: 1.0000 | ORAL_TABLET | Freq: Two times a day (BID) | ORAL | 0 refills | Status: DC

## 2018-03-13 MED ORDER — ISOSORBIDE MONONITRATE ER 60 MG PO TB24: 60.0000 mg | ORAL_TABLET | Freq: Every day | ORAL | 1 refills | Status: DC

## 2018-03-13 MED ORDER — METOPROLOL SUCCINATE ER 100 MG PO TB24: ORAL_TABLET | ORAL | 1 refills | Status: DC

## 2018-03-13 NOTE — Progress Notes (Signed)
Subjective:    Patient ID: Teresa Holloway, female    DOB: March 20, 1941, 77 y.o.   MRN: [id number]   Chief Complaint: medical management of chronic issues  HPI:  1. Coronary artery disease involving native coronary artery of native heart with angina pectoris with documented spasm [facility])  Last cardiology appointment was 09/17/17. According to note no change was made to plan of care and she is to follow up in 1 year.  2. Essential hypertension, benign  No c/o chest pain, sob or headache. Does not check blood pressure at home. BP Readings from Last 3 Encounters:  12/11/17 104/60  10/30/17 (!) 104/59  09/10/17 112/67     3. Gastroesophageal reflux disease without esophagitis  Takes ranitidine OTC and works well to keep symptoms under control.  4. BMI 30.0-30.9,adult  No weight changes  5. Recurrent major depressive disorder, in full remission [facility])  Patient has been on paxil for years . Has been working well for her. Depression screen [facility] 2/9 03/13/2018 12/11/2017 10/30/2017  Decreased Interest 0 0 0  Down, Depressed, Hopeless 0 0 0  PHQ - 2 Score 0 0 0     6. Hyperlipidemia with target LDL less than 100  Does not watch fats in diet and does very little exercise  7. Opioid type dependence, continuous (HCC)  Has been on norco BID for awhile.  8. Pain management contract agreement  Signed new agreement today  9. Primary osteoarthritis of left hip  Pain assessment: Cause of pain- osteoarthritis Pain location- mainly in hips Pain on scale of 1-10- 5/10 currently Frequency- daily What increases pain-standing and walking for long periods of time What makes pain Better-rest Effects on ADL - able  To get done what she need sto. Any change in general medical condition-none  Current medications- norco 5/325 bid Effectiveness of current meds-helps but never completely relieves pain. Adverse reactions form pain meds-none Morphine equivalent- 10  Pill count performed-No Urine drug  screen- No Was the NCCSR reviewed- yes  If yes were their any concerning findings? - none  Pain contract signed on: 03/13/18   10. Age related osteoporosis, unspecified pathological fracture presence Last dexascan was done  In 2015.    Outpatient Encounter Medications as of 03/13/2018  Medication Sig  . alendronate (FOSAMAX) 70 MG tablet Take 1 tablet (70 mg total) by mouth every 7 (seven) days. Take with a full glass of water on an empty stomach.  Marland Kitchen amLODipine (NORVASC) 10 MG tablet Take 1 tablet (10 mg total) by mouth daily.  Marland Kitchen aspirin 81 MG tablet Take 81 mg by mouth daily.  Marland Kitchen atorvastatin (LIPITOR) 40 MG tablet Take 1 tablet (40 mg total) by mouth daily at 6 PM.  . benazepril (LOTENSIN) 40 MG tablet Take 1 tablet (40 mg total) by mouth daily.  . cholecalciferol (VITAMIN D) 1000 UNITS tablet Take 1,000 Units by mouth daily.  . clopidogrel (PLAVIX) 75 MG tablet Take 1 tablet (75 mg total) by mouth daily.  . [facility] RANITIDINE 75 MG tablet TAKE 1 TABLET (75 MG TOTAL) BY MOUTH DAILY.  . hydrochlorothiazide (HYDRODIURIL) 25 MG tablet TAKE 1 TABLET BY MOUTH EVERY DAY  . isosorbide mononitrate (IMDUR) 60 MG 24 hr tablet Take 1 tablet (60 mg total) by mouth daily.  Marland Kitchen latanoprost (XALATAN) 0.005 % ophthalmic solution Place 1 drop into both eyes at bedtime.  Marland Kitchen LUMIGAN 0.01 % SOLN USE 1 DROP AT BEDTIME INTO BOTH EYES  . metoprolol succinate (TOPROL-XL) 100 MG 24  hr tablet Take 1/2 tablet daily  . NITROSTAT 0.4 MG SL tablet Place 0.4 mg under the tongue every 5 (five) minutes as needed for chest pain.   Marland Kitchen PARoxetine (PAXIL) 40 MG tablet Take 1 tablet (40 mg total) by mouth daily.      New complaints: None today  Social history: Lives with husband - he is doing well   Review of Systems  Constitutional: Negative for activity change and appetite change.  HENT: Negative.   Eyes: Negative for pain.  Respiratory: Negative for shortness of breath.   Cardiovascular: Negative for chest pain,  palpitations and leg swelling.  Gastrointestinal: Negative for abdominal pain.  Endocrine: Negative for polydipsia.  Genitourinary: Negative.   Skin: Negative for rash.  Neurological: Negative for dizziness, weakness and headaches.  Hematological: Does not bruise/bleed easily.  Psychiatric/Behavioral: Negative.   All other systems reviewed and are negative.      Objective:   Physical Exam  Constitutional: She is oriented to person, place, and time. She appears well-developed and well-nourished. No distress.  HENT:  Head: Normocephalic.  Nose: Nose normal.  Mouth/Throat: Oropharynx is clear and moist.  Eyes: Pupils are equal, round, and reactive to light. EOM are normal.  Neck: Normal range of motion. Neck supple. No JVD present. Carotid bruit is not present.  Cardiovascular: Normal rate, regular rhythm, normal heart sounds and intact distal pulses.  Pulmonary/Chest: Effort normal and breath sounds normal. No respiratory distress. She has no wheezes. She has no rales. She exhibits no tenderness.  Abdominal: Soft. Normal appearance, normal aorta and bowel sounds are normal. She exhibits no distension, no abdominal bruit, no pulsatile midline mass and no mass. There is no splenomegaly or hepatomegaly. There is no tenderness.  Musculoskeletal: Normal range of motion. She exhibits no edema.  Pain in bil hips with ambulation Gait slow and staedy  Lymphadenopathy:    She has no cervical adenopathy.  Neurological: She is alert and oriented to person, place, and time. She has normal reflexes.  Skin: Skin is warm and dry.  Psychiatric: She has a normal mood and affect. Her behavior is normal. Judgment and thought content normal.  Nursing note and vitals reviewed.  BP 108/62   Pulse (!) 56   Temp 97.8 F (36.6 C) (Oral)   Ht 5\' 4"  (1.626 m)   Wt 166 lb (75.3 kg)   BMI 28.49 kg/m       Assessment & Plan:  Teresa Holloway comes in today with chief complaint of Medical Management of  Chronic Issues   Diagnosis and orders addressed:  1. Coronary artery disease involving native coronary artery of native heart with angina pectoris with documented spasm (HCC) Keep follow up appointment with cardiology - metoprolol succinate (TOPROL-XL) 100 MG 24 hr tablet; Take 1/2 tablet daily  Dispense: 45 tablet; Refill: 1 - isosorbide mononitrate (IMDUR) 60 MG 24 hr tablet; Take 1 tablet (60 mg total) by mouth daily.  Dispense: 90 tablet; Refill: 1  2. Essential hypertension, benign Low sodium diet  3. Gastroesophageal reflux disease without esophagitis Avoid spicy foods Do not eat 2 hours prior to bedtime  4. BMI 30.0-30.9,adult Discussed diet and exercise for person with BMI >25 Will recheck weight in 3-6 months  5. Recurrent major depressive disorder, in full remission (HCC) stress management  6. Hyperlipidemia with target LDL less than 100 Low fat diet  7. Opioid type dependence, continuous (HCC) - HYDROcodone-acetaminophen (NORCO) 5-325 MG tablet; Take 1 tablet by mouth 2 (two) times  daily.  Dispense: 60 tablet; Refill: 0  8. Pain management contract agreement New contract signed today  9. Primary osteoarthritis of left hip  10. Age related osteoporosis, unspecified pathological fracture presence Refuses dexascan   Labs pending Health Maintenance reviewed Diet and exercise encouraged  Follow up plan: 3 months   Mary-Margaret Daphine Deutscher, FNP

## 2018-03-13 NOTE — Addendum Note (Signed)
Addended by: Chevis Pretty on: 03/13/2018 02:37 PM   Modules accepted: Orders

## 2018-03-13 NOTE — Patient Instructions (Signed)

## 2018-03-14 LAB — CMP14+EGFR
A/G RATIO: 1.9 (ref 1.2–2.2)
ALT: 5 IU/L (ref 0–32)
AST: 10 IU/L (ref 0–40)
Albumin: 4.2 g/dL (ref 3.5–4.8)
Alkaline Phosphatase: 90 IU/L (ref 39–117)
BILIRUBIN TOTAL: 0.3 mg/dL (ref 0.0–1.2)
BUN/Creatinine Ratio: 17 (ref 12–28)
BUN: 16 mg/dL (ref 8–27)
CALCIUM: 9.6 mg/dL (ref 8.7–10.3)
CHLORIDE: 99 mmol/L (ref 96–106)
CO2: 29 mmol/L (ref 20–29)
Creatinine, Ser: 0.95 mg/dL (ref 0.57–1.00)
GFR calc Af Amer: 67 mL/min/{1.73_m2} (ref >59)
GFR calc non Af Amer: 58 mL/min/{1.73_m2} — ABNORMAL LOW (ref >59)
GLOBULIN, TOTAL: 2.2 g/dL (ref 1.5–4.5)
Glucose: 95 mg/dL (ref 65–99)
Potassium: 3.8 mmol/L (ref 3.5–5.2)
Sodium: 142 mmol/L (ref 134–144)
TOTAL PROTEIN: 6.4 g/dL (ref 6.0–8.5)

## 2018-03-14 LAB — LIPID PANEL
CHOLESTEROL TOTAL: 125 mg/dL (ref 100–199)
Chol/HDL Ratio: 2 ratio (ref 0.0–4.4)
HDL: 63 mg/dL (ref >39)
LDL CALC: 42 mg/dL (ref 0–99)
TRIGLYCERIDES: 102 mg/dL (ref 0–149)
VLDL CHOLESTEROL CAL: 20 mg/dL (ref 5–40)

## 2018-03-16 ENCOUNTER — Ambulatory Visit: Payer: Medicare Other | Admitting: Nurse Practitioner

## 2018-04-03 ENCOUNTER — Other Ambulatory Visit: Payer: Self-pay | Admitting: Nurse Practitioner

## 2018-04-03 DIAGNOSIS — I25111 Atherosclerotic heart disease of native coronary artery with angina pectoris with documented spasm: Secondary | ICD-10-CM

## 2018-04-06 ENCOUNTER — Telehealth: Payer: Self-pay | Admitting: Nurse Practitioner

## 2018-04-06 DIAGNOSIS — I25111 Atherosclerotic heart disease of native coronary artery with angina pectoris with documented spasm: Secondary | ICD-10-CM

## 2018-04-06 MED ORDER — METOPROLOL SUCCINATE ER 100 MG PO TB24: ORAL_TABLET | ORAL | 1 refills | Status: DC

## 2018-04-06 NOTE — Telephone Encounter (Signed)
Pharmacy notified.

## 2018-04-06 NOTE — Telephone Encounter (Signed)
Is pt to take 1 tablet or half tablet? Please advise

## 2018-04-06 NOTE — Telephone Encounter (Signed)
I think it I susppose to be 1/2tablet daily

## 2018-04-28 DIAGNOSIS — M1712 Unilateral primary osteoarthritis, left knee: Secondary | ICD-10-CM | POA: Diagnosis not present

## 2018-05-09 ENCOUNTER — Other Ambulatory Visit: Payer: Self-pay | Admitting: Nurse Practitioner

## 2018-05-09 DIAGNOSIS — E785 Hyperlipidemia, unspecified: Secondary | ICD-10-CM

## 2018-05-09 NOTE — Telephone Encounter (Signed)
Pt to contact pharmacy about pain medication Cholesterol medication was her other refill, this was done in Rx pool

## 2018-05-12 DIAGNOSIS — M1712 Unilateral primary osteoarthritis, left knee: Secondary | ICD-10-CM | POA: Diagnosis not present

## 2018-05-19 DIAGNOSIS — M1712 Unilateral primary osteoarthritis, left knee: Secondary | ICD-10-CM | POA: Diagnosis not present

## 2018-05-26 DIAGNOSIS — M1712 Unilateral primary osteoarthritis, left knee: Secondary | ICD-10-CM | POA: Diagnosis not present

## 2018-06-05 ENCOUNTER — Other Ambulatory Visit: Payer: Self-pay | Admitting: Nurse Practitioner

## 2018-06-05 DIAGNOSIS — I10 Essential (primary) hypertension: Secondary | ICD-10-CM

## 2018-06-16 ENCOUNTER — Encounter: Payer: Self-pay | Admitting: Nurse Practitioner

## 2018-06-16 ENCOUNTER — Ambulatory Visit (INDEPENDENT_AMBULATORY_CARE_PROVIDER_SITE_OTHER): Payer: Medicare Other | Admitting: Nurse Practitioner

## 2018-06-16 VITALS — BP 118/63 | HR 53 | Temp 98.2°F | Ht 64.0 in | Wt 168.0 lb

## 2018-06-16 DIAGNOSIS — E785 Hyperlipidemia, unspecified: Secondary | ICD-10-CM | POA: Diagnosis not present

## 2018-06-16 DIAGNOSIS — M1612 Unilateral primary osteoarthritis, left hip: Secondary | ICD-10-CM

## 2018-06-16 DIAGNOSIS — I10 Essential (primary) hypertension: Secondary | ICD-10-CM | POA: Diagnosis not present

## 2018-06-16 DIAGNOSIS — M810 Age-related osteoporosis without current pathological fracture: Secondary | ICD-10-CM | POA: Diagnosis not present

## 2018-06-16 DIAGNOSIS — I25111 Atherosclerotic heart disease of native coronary artery with angina pectoris with documented spasm: Secondary | ICD-10-CM

## 2018-06-16 DIAGNOSIS — F3342 Major depressive disorder, recurrent, in full remission: Secondary | ICD-10-CM

## 2018-06-16 DIAGNOSIS — F1120 Opioid dependence, uncomplicated: Secondary | ICD-10-CM

## 2018-06-16 MED ORDER — ISOSORBIDE MONONITRATE ER 60 MG PO TB24: 60.0000 mg | ORAL_TABLET | Freq: Every day | ORAL | 1 refills | Status: DC

## 2018-06-16 MED ORDER — ATORVASTATIN CALCIUM 40 MG PO TABS: 40.0000 mg | ORAL_TABLET | Freq: Every day | ORAL | 1 refills | Status: DC

## 2018-06-16 MED ORDER — HYDROCODONE-ACETAMINOPHEN 5-325 MG PO TABS: 1.0000 | ORAL_TABLET | Freq: Four times a day (QID) | ORAL | 0 refills | Status: DC | PRN

## 2018-06-16 MED ORDER — HYDROCODONE-ACETAMINOPHEN 5-325 MG PO TABS: 1.0000 | ORAL_TABLET | Freq: Two times a day (BID) | ORAL | 0 refills | Status: DC

## 2018-06-16 MED ORDER — METOPROLOL SUCCINATE ER 100 MG PO TB24: ORAL_TABLET | ORAL | 1 refills | Status: DC

## 2018-06-16 MED ORDER — HYDROCHLOROTHIAZIDE 25 MG PO TABS: 25.0000 mg | ORAL_TABLET | Freq: Every day | ORAL | 1 refills | Status: DC

## 2018-06-16 MED ORDER — BENAZEPRIL HCL 40 MG PO TABS: 40.0000 mg | ORAL_TABLET | Freq: Every day | ORAL | 1 refills | Status: DC

## 2018-06-16 MED ORDER — CLOPIDOGREL BISULFATE 75 MG PO TABS: 75.0000 mg | ORAL_TABLET | Freq: Every day | ORAL | 1 refills | Status: DC

## 2018-06-16 MED ORDER — AMLODIPINE BESYLATE 10 MG PO TABS: 10.0000 mg | ORAL_TABLET | Freq: Every day | ORAL | 1 refills | Status: DC

## 2018-06-16 MED ORDER — PAROXETINE HCL 40 MG PO TABS: 40.0000 mg | ORAL_TABLET | Freq: Every day | ORAL | 1 refills | Status: DC

## 2018-06-16 NOTE — Addendum Note (Signed)
Addended by: Chevis Pretty on: 06/16/2018 02:32 PM   Modules accepted: Orders

## 2018-06-16 NOTE — Progress Notes (Signed)
Subjective:    Patient ID: Teresa Holloway, female    DOB: [birth year], 78 y.o.   MRN: [id number]   Chief Complaint: Medical Management of Chronic Issues   HPI:  1. Essential hypertension, benign  No c/o chest pain, sob or headache. Does not check blood pressure at home. BP Readings from Last 3 Encounters:  03/13/18 108/62  12/11/17 104/60  10/30/17 (!) 104/59     2. Coronary artery disease involving native coronary artery of native heart with angina pectoris with documented spasm [facility])  Last saw cardiology on 09/17/17. No changes were made to plan of care and she is to follow up yearly. Again deneis chest pain and no palpitations.  3. Age related osteoporosis, unspecified pathological fracture presence Last dexascan was done  06/23/13- needs to be repeated  4. Hyperlipidemia with target LDL less than 100  Does not watch diet and does no exercise  5. Recurrent major depressive disorder, in full remission [facility])  patient has en on paxil for greater then 10 years. Says she is oding well. Depression screen [facility] 2/9 06/16/2018 03/13/2018 12/11/2017  Decreased Interest 0 0 0  Down, Depressed, Hopeless 0 0 0  PHQ - 2 Score 0 0 0     Pain assessment: Cause of pain- arthritis  Pain location- bil knees and right hip Pain on scale of 1-10- 10/10- she has been out of her pain meds Frequency- daily What increases pain-walking and standing makes it worse What makes pain Better-sitting Effects on ADL - pushes through and does what she neds to do Any change in general medical condition-none  Current medications- lortab 5/325 BID Effectiveness of current meds-never completely relievespain Adverse reactions form pain meds-none Morphine equivalent-  Pill count performed-No Urine drug screen- No Was the NCCSR reviewed- yes  If yes were their any concerning findings? - none  Pain contract signed on: 03/25/18   Outpatient Encounter Medications as of 06/16/2018  Medication Sig  .  alendronate (FOSAMAX) 70 MG tablet Take 1 tablet (70 mg total) by mouth every 7 (seven) days. Take with a full glass of water on an empty stomach.  Marland Kitchen amLODipine (NORVASC) 10 MG tablet TAKE 1 TABLET BY MOUTH EVERY DAY  . aspirin 81 MG tablet Take 81 mg by mouth daily.  Marland Kitchen atorvastatin (LIPITOR) 40 MG tablet TAKE 1 TABLET (40 MG TOTAL) BY MOUTH DAILY AT 6 PM.  . benazepril (LOTENSIN) 40 MG tablet Take 1 tablet (40 mg total) by mouth daily.  . cholecalciferol (VITAMIN D) 1000 UNITS tablet Take 1,000 Units by mouth daily.  . clopidogrel (PLAVIX) 75 MG tablet Take 1 tablet (75 mg total) by mouth daily.  . [facility] RANITIDINE 75 MG tablet TAKE 1 TABLET (75 MG TOTAL) BY MOUTH DAILY.  . hydrochlorothiazide (HYDRODIURIL) 25 MG tablet TAKE 1 TABLET BY MOUTH EVERY DAY  . isosorbide mononitrate (IMDUR) 60 MG 24 hr tablet Take 1 tablet (60 mg total) by mouth daily.  Marland Kitchen latanoprost (XALATAN) 0.005 % ophthalmic solution Place 1 drop into both eyes at bedtime.  Marland Kitchen LUMIGAN 0.01 % SOLN USE 1 DROP AT BEDTIME INTO BOTH EYES  . metoprolol succinate (TOPROL-XL) 100 MG 24 hr tablet TAKE 1/2 TABLET DAILY  . NITROSTAT 0.4 MG SL tablet Place 0.4 mg under the tongue every 5 (five) minutes as needed for chest pain.   Marland Kitchen PARoxetine (PAXIL) 40 MG tablet Take 1 tablet (40 mg total) by mouth daily.  Marland Kitchen HYDROcodone-acetaminophen (NORCO) 5-325 MG tablet Take 1 tablet  by mouth 2 (two) times daily.  Marland Kitchen HYDROcodone-acetaminophen (NORCO) 5-325 MG tablet Take 1 tablet by mouth every 6 (six) hours as needed for moderate pain.  Marland Kitchen HYDROcodone-acetaminophen (NORCO) 5-325 MG tablet Take 1 tablet by mouth every 6 (six) hours as needed for moderate pain.       New complaints: None today  Social history: husband still living and is doing well.    Review of Systems  Constitutional: Negative for activity change and appetite change.  HENT: Negative.   Eyes: Negative for pain.  Respiratory: Negative for shortness of breath.   Cardiovascular:  Negative for chest pain, palpitations and leg swelling.  Gastrointestinal: Negative for abdominal pain.  Endocrine: Negative for polydipsia.  Genitourinary: Negative.   Skin: Negative for rash.  Neurological: Negative for dizziness, weakness and headaches.  Hematological: Does not bruise/bleed easily.  Psychiatric/Behavioral: Negative.   All other systems reviewed and are negative.      Objective:   Physical Exam Vitals signs and nursing note reviewed.  Constitutional:      General: She is not in acute distress.    Appearance: Normal appearance. She is well-developed.  HENT:     Head: Normocephalic.     Nose: Nose normal.  Eyes:     Pupils: Pupils are equal, round, and reactive to light.  Neck:     Musculoskeletal: Normal range of motion and neck supple.     Vascular: No carotid bruit or JVD.  Cardiovascular:     Rate and Rhythm: Normal rate and regular rhythm.     Heart sounds: Normal heart sounds.  Pulmonary:     Effort: Pulmonary effort is normal. No respiratory distress.     Breath sounds: Normal breath sounds. No wheezing or rales.  Chest:     Chest wall: No tenderness.  Abdominal:     General: Bowel sounds are normal. There is no distension or abdominal bruit.     Palpations: Abdomen is soft. There is no hepatomegaly, splenomegaly, mass or pulsatile mass.     Tenderness: There is no abdominal tenderness.  Musculoskeletal: Normal range of motion.     Comments: Walks with slight limp on right Pain with ambulation of hip and knees Left knee limited extension crepitius bil knees on flexion and extension FROM of right ip without pain.  Lymphadenopathy:     Cervical: No cervical adenopathy.  Skin:    General: Skin is warm and dry.  Neurological:     Mental Status: She is alert and oriented to person, place, and time.     Deep Tendon Reflexes: Reflexes are normal and symmetric.  Psychiatric:        Behavior: Behavior normal.        Thought Content: Thought content  normal.        Judgment: Judgment normal.    BP 118/63   Pulse (!) 53   Temp 98.2 F (36.8 C) (Oral)   Ht 5\' 4"  (1.626 m)   Wt 168 lb (76.2 kg)   BMI 28.84 kg/m         Assessment & Plan:  Teresa Holloway comes in today with chief complaint of Medical Management of Chronic Issues   Diagnosis and orders addressed:  1. Essential hypertension, benign Low salt diet - hydrochlorothiazide (HYDRODIURIL) 25 MG tablet; Take 1 tablet (25 mg total) by mouth daily.  Dispense: 90 tablet; Refill: 1 - amLODipine (NORVASC) 10 MG tablet; Take 1 tablet (10 mg total) by mouth daily.  Dispense: 90 tablet;  Refill: 1 - benazepril (LOTENSIN) 40 MG tablet; Take 1 tablet (40 mg total) by mouth daily.  Dispense: 90 tablet; Refill: 1  2. Coronary artery disease involving native coronary artery of native heart with angina pectoris with documented spasm (HCC) Keep yearly follow up with cardiology - metoprolol succinate (TOPROL-XL) 100 MG 24 hr tablet; TAKE 1/2 TABLET DAILY  Dispense: 90 tablet; Refill: 1 - isosorbide mononitrate (IMDUR) 60 MG 24 hr tablet; Take 1 tablet (60 mg total) by mouth daily.  Dispense: 90 tablet; Refill: 1 - clopidogrel (PLAVIX) 75 MG tablet; Take 1 tablet (75 mg total) by mouth daily.  Dispense: 90 tablet; Refill: 1  3. Age related osteoporosis, unspecified pathological fracture presence Weight bearing exercises if can tolerate Refuses dexascan today  4. Hyperlipidemia with target LDL less than 100 Low fat diet - atorvastatin (LIPITOR) 40 MG tablet; Take 1 tablet (40 mg total) by mouth daily at 6 PM.  Dispense: 90 tablet; Refill: 1  5. Recurrent major depressive disorder, in full remission (HCC) stres management - PARoxetine (PAXIL) 40 MG tablet; Take 1 tablet (40 mg total) by mouth daily.  Dispense: 90 tablet; Refill: 1  6. Primary osteoarthritis of left hip Keep follow up with ortho  7. Opioid type dependence, continuous (HCC) - HYDROcodone-acetaminophen (NORCO)  5-325 MG tablet; Take 1 tablet by mouth 2 (two) times daily for 30 days.  Dispense: 60 tablet; Refill: 0   Labs pending Health Maintenance reviewed Diet and exercise encouraged  Follow up plan: 3 months   Mary-Margaret Daphine Deutscher, FNP

## 2018-06-16 NOTE — Patient Instructions (Signed)

## 2018-06-17 LAB — CMP14+EGFR
ALK PHOS: 89 IU/L (ref 39–117)
ALT: 7 IU/L (ref 0–32)
AST: 10 IU/L (ref 0–40)
Albumin/Globulin Ratio: 2.2 (ref 1.2–2.2)
Albumin: 4.3 g/dL (ref 3.7–4.7)
BUN/Creatinine Ratio: 18 (ref 12–28)
BUN: 17 mg/dL (ref 8–27)
Bilirubin Total: 0.3 mg/dL (ref 0.0–1.2)
CALCIUM: 9.6 mg/dL (ref 8.7–10.3)
CO2: 29 mmol/L (ref 20–29)
Chloride: 101 mmol/L (ref 96–106)
Creatinine, Ser: 0.94 mg/dL (ref 0.57–1.00)
GFR calc Af Amer: 68 mL/min/{1.73_m2} (ref >59)
GFR, EST NON AFRICAN AMERICAN: 59 mL/min/{1.73_m2} — AB (ref >59)
Globulin, Total: 2 g/dL (ref 1.5–4.5)
Glucose: 92 mg/dL (ref 65–99)
Potassium: 4.1 mmol/L (ref 3.5–5.2)
Sodium: 143 mmol/L (ref 134–144)
Total Protein: 6.3 g/dL (ref 6.0–8.5)

## 2018-06-17 LAB — LIPID PANEL
Chol/HDL Ratio: 2 ratio (ref 0.0–4.4)
Cholesterol, Total: 140 mg/dL (ref 100–199)
HDL: 71 mg/dL (ref >39)
LDL Calculated: 50 mg/dL (ref 0–99)
Triglycerides: 94 mg/dL (ref 0–149)
VLDL Cholesterol Cal: 19 mg/dL (ref 5–40)

## 2018-06-30 ENCOUNTER — Ambulatory Visit (INDEPENDENT_AMBULATORY_CARE_PROVIDER_SITE_OTHER): Payer: Medicare Other | Admitting: Nurse Practitioner

## 2018-06-30 ENCOUNTER — Encounter: Payer: Self-pay | Admitting: Nurse Practitioner

## 2018-06-30 VITALS — BP 116/66 | HR 55 | Temp 97.1°F | Ht 64.0 in | Wt 168.0 lb

## 2018-06-30 DIAGNOSIS — S161XXA Strain of muscle, fascia and tendon at neck level, initial encounter: Secondary | ICD-10-CM | POA: Diagnosis not present

## 2018-06-30 DIAGNOSIS — I25111 Atherosclerotic heart disease of native coronary artery with angina pectoris with documented spasm: Secondary | ICD-10-CM

## 2018-06-30 MED ORDER — DICLOFENAC SODIUM 1 % TD GEL: 2.0000 g | Freq: Four times a day (QID) | TRANSDERMAL | 1 refills | Status: DC

## 2018-06-30 MED ORDER — METHOCARBAMOL 500 MG PO TABS: 500.0000 mg | ORAL_TABLET | Freq: Four times a day (QID) | ORAL | 1 refills | Status: DC

## 2018-06-30 NOTE — Progress Notes (Signed)
Subjective:    Patient ID: Teresa Holloway, female    DOB: 10/03/1940, 78 y.o.   MRN: [id number]   Chief Complaint:  Neck pain  HPI Patient come sin c/o of right neck pain. Hurts to turn it to the right. Has been putting heat on it. Started about 1 week ago. Has gotten worse. Heating pad make sit better and turning head and flexes head downward makes it worse. Rates pain 10/10 when turning head. denies new pillow or injury of any kind.    Review of Systems  Respiratory: Negative.   Cardiovascular: Negative.   Genitourinary: Negative.   Musculoskeletal: Positive for neck pain.  Neurological: Negative.   Psychiatric/Behavioral: Negative.   All other systems reviewed and are negative.      Objective:   Physical Exam Vitals signs and nursing note reviewed.  Constitutional:      Appearance: Normal appearance.  Cardiovascular:     Rate and Rhythm: Normal rate and regular rhythm.     Heart sounds: Normal heart sounds.  Pulmonary:     Effort: Pulmonary effort is normal.     Breath sounds: Normal breath sounds.  Musculoskeletal:     Comments: Decreased ROM of cervical spine with pain on rotation to left and flexion.  No point tenderness of neck Grips equal bil  Skin:    General: Skin is warm.  Neurological:     General: No focal deficit present.     Mental Status: She is alert and oriented to person, place, and time.  Psychiatric:        Mood and Affect: Mood normal.        Behavior: Behavior normal.    BP 116/66   Pulse (!) 55   Temp (!) 97.1 F (36.2 C) (Oral)   Ht 5\' 4"  (1.626 m)   Wt 168 lb (76.2 kg)   BMI 28.84 kg/m      Assessment & Plan:  Teresa Holloway in today with chief complaint of Neck Pain (For 1 week)   1. Strain of neck muscle, initial encounter Moist heat Rest No heavy lifting RTO prn - methocarbamol (ROBAXIN) 500 MG tablet; Take 1 tablet (500 mg total) by mouth 4 (four) times daily.  Dispense: 30 tablet; Refill: 1 - diclofenac sodium  (VOLTAREN) 1 % GEL; Apply 2 g topically 4 (four) times daily.  Dispense: 500 g; Refill: 1  Mary-Margaret Daphine Deutscher, FNP

## 2018-06-30 NOTE — Patient Instructions (Signed)
Acute Torticollis, Adult  Torticollis is a condition in which the muscles of the neck tighten (contract) abnormally, causing the neck to twist and the head to move into an unnatural position. Torticollis that develops suddenly is called acute torticollis. People with acute torticollis may have trouble turning their head. The condition can be painful and may range from mild to severe.  What are the causes?  This condition may be caused by:   Sleeping in an awkward position (common).   Extending or twisting the neck muscles beyond their normal position.   An injury to the neck muscles.   An infection.   A tumor.   Certain medicines.   Long-lasting spasms of the neck muscles.  In some cases, the cause may not be known.  What increases the risk?  You are more likely to develop this condition if:   You have a condition associated with loose ligaments, such as Down syndrome.   You have a brain condition that affects vision, such as strabismus.  What are the signs or symptoms?  The main symptom of this condition is tilting of the head to one side. Other symptoms include:   Pain in the neck.   Trouble turning the head from side to side or up and down.  How is this diagnosed?  This condition may be diagnosed based on:   A physical exam.   Your medical history.   Imaging tests, such as:  ? An X-ray.  ? An ultrasound.  ? A CT scan.  ? An MRI.  How is this treated?  Treatment for this condition depends on what is causing the condition. Mild cases may go away without treatment. Treatment for more serious cases may include:   Medicines or shots to relax the muscles.   Other medicines, such as antibiotics to treat the underlying cause.   Wearing a soft neck collar.   Physical therapy and stretching to improve neck strength and flexibility.   Neck massage.  In severe cases, surgery may be needed to repair dislocated or broken bones or to treat nerves in the neck.  Follow these instructions at home:     Take  over-the-counter and prescription medicines only as told by your health care provider.   Do stretching exercises and massage your neck as told by your health care provider.   If directed, apply heat to the affected area as often as told by your health care provider. Use the heat source that your health care provider recommends, such as a moist heat pack or a heating pad.  ? Place a towel between your skin and the heat source.  ? Leave the heat on for 20-30 minutes.  ? Remove the heat if your skin turns bright red. This is especially important if you are unable to feel pain, heat, or cold. You may have a greater risk of getting burned.   If you wake up with torticollis after sleeping, check your bed or sleeping area. Look for lumpy pillows or unusual objects. Make sure your bed and sleeping area are comfortable.   Keep all follow-up visits as told by your health care provider. This is important.  Contact a health care provider if:   You have a fever.   Your symptoms do not improve or they get worse.  Get help right away if:   You have trouble breathing.   You develop noisy breathing (stridor).   You start to drool.   You have trouble swallowing or pain   when swallowing.   You develop numbness or weakness in your hands or feet.   You have changes in your speech, understanding, or vision.   You are in severe pain.   You cannot move your head or neck.  Summary   Torticollis is a condition in which the muscles of the neck tighten (contract) abnormally, causing the neck to twist and the head to move into an unnatural position. Torticollis that develops suddenly is called acute torticollis.   Treatment for this condition depends on what is causing the condition. Mild cases may go away without treatment.   Do stretching exercises and massage your neck as told by your health care provider. You may also be instructed to apply heat to the area.   Contact your health care provider if your symptoms do not  improve or they get worse.  This information is not intended to replace advice given to you by your health care provider. Make sure you discuss any questions you have with your health care provider.  Document Released: 04/05/2000 Document Revised: 06/06/2016 Document Reviewed: 06/06/2016  Elsevier Interactive Patient Education  2019 [employer]

## 2018-07-21 DIAGNOSIS — M1712 Unilateral primary osteoarthritis, left knee: Secondary | ICD-10-CM | POA: Diagnosis not present

## 2018-08-17 ENCOUNTER — Other Ambulatory Visit: Payer: Self-pay | Admitting: Nurse Practitioner

## 2018-08-17 DIAGNOSIS — S161XXA Strain of muscle, fascia and tendon at neck level, initial encounter: Secondary | ICD-10-CM

## 2018-08-18 DIAGNOSIS — M1712 Unilateral primary osteoarthritis, left knee: Secondary | ICD-10-CM | POA: Diagnosis not present

## 2018-08-18 NOTE — Telephone Encounter (Signed)
Pharmacy comment:  Alternative Requested:NOT PREFERRED

## 2018-08-21 ENCOUNTER — Other Ambulatory Visit: Payer: Self-pay | Admitting: Nurse Practitioner

## 2018-08-21 DIAGNOSIS — S161XXA Strain of muscle, fascia and tendon at neck level, initial encounter: Secondary | ICD-10-CM

## 2018-08-24 ENCOUNTER — Other Ambulatory Visit: Payer: Self-pay | Admitting: Nurse Practitioner

## 2018-08-24 DIAGNOSIS — M810 Age-related osteoporosis without current pathological fracture: Secondary | ICD-10-CM

## 2018-08-24 NOTE — Telephone Encounter (Signed)
Last office visit 06/30/2018 for neck muscle strain

## 2018-09-07 ENCOUNTER — Other Ambulatory Visit: Payer: Self-pay

## 2018-09-08 ENCOUNTER — Other Ambulatory Visit: Payer: Self-pay

## 2018-09-08 ENCOUNTER — Ambulatory Visit: Payer: Medicare Other | Admitting: Nurse Practitioner

## 2018-09-09 ENCOUNTER — Ambulatory Visit (INDEPENDENT_AMBULATORY_CARE_PROVIDER_SITE_OTHER): Payer: Medicare Other | Admitting: Nurse Practitioner

## 2018-09-09 ENCOUNTER — Encounter: Payer: Self-pay | Admitting: Nurse Practitioner

## 2018-09-09 ENCOUNTER — Other Ambulatory Visit: Payer: Self-pay

## 2018-09-09 VITALS — BP 135/69 | HR 53 | Temp 97.1°F | Ht 64.0 in | Wt 166.0 lb

## 2018-09-09 DIAGNOSIS — F3342 Major depressive disorder, recurrent, in full remission: Secondary | ICD-10-CM

## 2018-09-09 DIAGNOSIS — I10 Essential (primary) hypertension: Secondary | ICD-10-CM | POA: Diagnosis not present

## 2018-09-09 DIAGNOSIS — E785 Hyperlipidemia, unspecified: Secondary | ICD-10-CM | POA: Diagnosis not present

## 2018-09-09 DIAGNOSIS — K219 Gastro-esophageal reflux disease without esophagitis: Secondary | ICD-10-CM | POA: Diagnosis not present

## 2018-09-09 DIAGNOSIS — M170 Bilateral primary osteoarthritis of knee: Secondary | ICD-10-CM

## 2018-09-09 DIAGNOSIS — Z6830 Body mass index (BMI) 30.0-30.9, adult: Secondary | ICD-10-CM

## 2018-09-09 DIAGNOSIS — F1120 Opioid dependence, uncomplicated: Secondary | ICD-10-CM

## 2018-09-09 DIAGNOSIS — I25111 Atherosclerotic heart disease of native coronary artery with angina pectoris with documented spasm: Secondary | ICD-10-CM

## 2018-09-09 DIAGNOSIS — M1612 Unilateral primary osteoarthritis, left hip: Secondary | ICD-10-CM

## 2018-09-09 DIAGNOSIS — M810 Age-related osteoporosis without current pathological fracture: Secondary | ICD-10-CM

## 2018-09-09 MED ORDER — HYDROCODONE-ACETAMINOPHEN 5-325 MG PO TABS: 1.0000 | ORAL_TABLET | Freq: Four times a day (QID) | ORAL | 0 refills | Status: DC | PRN

## 2018-09-09 MED ORDER — HYDROCODONE-ACETAMINOPHEN 5-325 MG PO TABS: 1.0000 | ORAL_TABLET | Freq: Two times a day (BID) | ORAL | 0 refills | Status: DC

## 2018-09-09 NOTE — Progress Notes (Signed)
Subjective:    Patient ID: Teresa Holloway, female    DOB: Jul 08, 1940, 78 y.o.   MRN: [id number]   Chief Complaint: medical management of chronic issues   HPI:  1. Essential hypertension, benign  no c/o chest pain, sob or headache. Does not ceck blood pressure at home. BP Readings from Last 3 Encounters:  06/30/18 116/66  06/16/18 118/63  03/13/18 108/62     2. Coronary artery disease involving native coronary artery of native heart with angina pectoris with documented spasm [facility]) Last saw cardiology in May of 2019. According to note no changes were made to plan of care. Had telephone visit on 08/26/18 for surgical clearance.  3. Hyperlipidemia with target LDL less than 100 Does not watch diet and does very little exercise  4. Gastroesophageal reflux disease without esophagitis Takes ranitidine daily and it works well to keep her symptoms under control  5. Recurrent major depressive disorder, in full remission (HCC) Is currently on paxil and is doing well. She deneis any depression right now.  6. Arthritis of both knees Has constant pain in both knees- rates 8/10 currently. Has been walking with cane for several months for reassurance she will not fall. She is going to have a total knee replacement once surgeries resume.  7. Primary osteoarthritis of left hip Left hip hurts all the time. Pain assessment: Cause of pain- arthritis Pain location- bil knees and left hip Pain on scale of 1-10- 8/10 Frequency- daily What increases pain-lots of activity What makes pain Better-rest Effects on ADL - pushes through and gets done what she needs to Any change in general medical condition-none  Current opioids rx- norco 10/325 BID # meds rx- 60 Effectiveness of current meds-helps Adverse reactions form pain meds-none Morphine equivalent- 15 MEDD  Pill count performed-No Last drug screen - not recently ( high risk q74m, moderate risk q27m, low risk yearly ) Urine drug screen  today- Yes Was the NCCSR reviewed- yes  If yes were their any concerning findings? - none    Pain contract signed on: 03/18/18   8. Age related osteoporosis, unspecified pathological fracture presence Last dexascan was in 2015 with t score of -2.5. she is currently on fosamax and vitamin d   9. BMI 30.0-30.9,adult No recent weight changes    Outpatient Encounter Medications as of 09/09/2018  Medication Sig  . alendronate (FOSAMAX) 70 MG tablet TAKE 1 TABLET (70 MG TOTAL) BY MOUTH EVERY 7 (SEVEN) DAYS. TAKE WITH A FULL GLASS OF WATER ON AN EMPTY STOMACH.  Marland Kitchen amLODipine (NORVASC) 10 MG tablet Take 1 tablet (10 mg total) by mouth daily.  Marland Kitchen aspirin 81 MG tablet Take 81 mg by mouth daily.  Marland Kitchen atorvastatin (LIPITOR) 40 MG tablet Take 1 tablet (40 mg total) by mouth daily at 6 PM.  . benazepril (LOTENSIN) 40 MG tablet Take 1 tablet (40 mg total) by mouth daily.  . cholecalciferol (VITAMIN D) 1000 UNITS tablet Take 1,000 Units by mouth daily.  . clopidogrel (PLAVIX) 75 MG tablet Take 1 tablet (75 mg total) by mouth daily.  . [facility] RANITIDINE 75 MG tablet TAKE 1 TABLET (75 MG TOTAL) BY MOUTH DAILY.  Marland Kitchen diclofenac sodium (VOLTAREN) 1 % GEL Apply 2 g topically 4 (four) times daily.  . hydrochlorothiazide (HYDRODIURIL) 25 MG tablet Take 1 tablet (25 mg total) by mouth daily.  Marland Kitchen HYDROcodone-acetaminophen (NORCO) 5-325 MG tablet Take 1 tablet by mouth every 6 (six) hours as needed for up to 30 days for  moderate pain.  Marland Kitchen HYDROcodone-acetaminophen (NORCO) 5-325 MG tablet Take 1 tablet by mouth 2 (two) times daily for 30 days.  Marland Kitchen HYDROcodone-acetaminophen (NORCO) 5-325 MG tablet Take 1 tablet by mouth every 6 (six) hours as needed for up to 30 days for moderate pain.  . isosorbide mononitrate (IMDUR) 60 MG 24 hr tablet Take 1 tablet (60 mg total) by mouth daily.  Marland Kitchen latanoprost (XALATAN) 0.005 % ophthalmic solution Place 1 drop into both eyes at bedtime.  Marland Kitchen LUMIGAN 0.01 % SOLN USE 1 DROP AT BEDTIME INTO  BOTH EYES  . methocarbamol (ROBAXIN) 500 MG tablet TAKE 1 TABLET (500 MG TOTAL) BY MOUTH 4 (FOUR) TIMES DAILY.  . metoprolol succinate (TOPROL-XL) 100 MG 24 hr tablet TAKE 1/2 TABLET DAILY  . NITROSTAT 0.4 MG SL tablet Place 0.4 mg under the tongue every 5 (five) minutes as needed for chest pain.   Marland Kitchen PARoxetine (PAXIL) 40 MG tablet Take 1 tablet (40 mg total) by mouth daily.     New complaints: None today  Social history: Lives with her husband and 3 dogs     Review of Systems  Constitutional: Negative for activity change and appetite change.  HENT: Negative.   Eyes: Negative for pain.  Respiratory: Negative for shortness of breath.   Cardiovascular: Negative for chest pain, palpitations and leg swelling.  Gastrointestinal: Negative for abdominal pain.  Endocrine: Negative for polydipsia.  Genitourinary: Negative.   Skin: Negative for rash.  Neurological: Negative for dizziness, weakness and headaches.  Hematological: Does not bruise/bleed easily.  Psychiatric/Behavioral: Negative.   All other systems reviewed and are negative.      Objective:   Physical Exam Vitals signs and nursing note reviewed.  Constitutional:      General: She is not in acute distress.    Appearance: Normal appearance. She is well-developed.  HENT:     Head: Normocephalic.     Nose: Nose normal.  Eyes:     Pupils: Pupils are equal, round, and reactive to light.  Neck:     Musculoskeletal: Normal range of motion and neck supple.     Vascular: No carotid bruit or JVD.  Cardiovascular:     Rate and Rhythm: Normal rate and regular rhythm.     Heart sounds: Normal heart sounds.  Pulmonary:     Effort: Pulmonary effort is normal. No respiratory distress.     Breath sounds: Normal breath sounds. No wheezing or rales.  Chest:     Chest wall: No tenderness.  Abdominal:     General: Bowel sounds are normal. There is no distension or abdominal bruit.     Palpations: Abdomen is soft. There is no  hepatomegaly, splenomegaly, mass or pulsatile mass.     Tenderness: There is no abdominal tenderness.  Musculoskeletal: Normal range of motion.  Lymphadenopathy:     Cervical: No cervical adenopathy.  Skin:    General: Skin is warm and dry.  Neurological:     Mental Status: She is alert and oriented to person, place, and time.     Deep Tendon Reflexes: Reflexes are normal and symmetric.  Psychiatric:        Behavior: Behavior normal.        Thought Content: Thought content normal.        Judgment: Judgment normal.    BP 135/69   Pulse (!) 53   Temp (!) 97.1 F (36.2 C) (Oral)   Ht 5\' 4"  (1.626 m)   Wt 166 lb (75.3 kg)  BMI 28.49 kg/m         Assessment & Plan:  Teresa Holloway comes in today with chief complaint of Medical Management of Chronic Issues   Diagnosis and orders addressed:  1. Essential hypertension, benign Low sodium diet - CMP14+EGFR  2. Coronary artery disease involving native coronary artery of native heart with angina pectoris with documented spasm [facility]) Keep follow up with cardiology  3. Hyperlipidemia with target LDL less than 100 Low fat diet - Lipid panel  4. Gastroesophageal reflux disease without esophagitis Avoid spicy foods Do not eat 2 hours prior to bedtime  5. Recurrent major depressive disorder, in full remission [facility]) Stress management  6. Arthritis of both knees Keep plan for knee replacemnet  7. Primary osteoarthritis of left hip  8. Age related osteoporosis, unspecified pathological fracture presence .weight bearing exercise  9. BMI 30.0-30.9,adult Discussed diet and exercise for person with BMI >25 Will recheck weight in 3-6 months  10. Opioid type dependence, continuous (HCC) - HYDROcodone-acetaminophen (NORCO) 5-325 MG tablet; Take 1 tablet by mouth every 6 (six) hours as needed for up to 30 days for moderate pain.  Dispense: 60 tablet; Refill: 0 - HYDROcodone-acetaminophen (NORCO) 5-325 MG tablet; Take 1 tablet  by mouth every 6 (six) hours as needed for up to 30 days for moderate pain.  Dispense: 60 tablet; Refill: 0 - HYDROcodone-acetaminophen (NORCO) 5-325 MG tablet; Take 1 tablet by mouth 2 (two) times daily for 30 days.  Dispense: 60 tablet; Refill: 0   Labs pending Health Maintenance reviewed Diet and exercise encouraged  Follow up plan: 3 months   Mary-Margaret Daphine Deutscher, FNP

## 2018-09-09 NOTE — Patient Instructions (Signed)
Fall Prevention in the Home, Adult  Falls can cause injuries. They can happen to people of all ages. There are many things you can do to make your home safe and to help prevent falls. Ask for help when making these changes, if needed.  What actions can I take to prevent falls?  General Instructions  · Use good lighting in all rooms. Replace any light bulbs that burn out.  · Turn on the lights when you go into a dark area. Use night-lights.  · Keep items that you use often in easy-to-reach places. Lower the shelves around your home if necessary.  · Set up your furniture so you have a clear path. Avoid moving your furniture around.  · Do not have throw rugs and other things on the floor that can make you trip.  · Avoid walking on wet floors.  · If any of your floors are uneven, fix them.  · Add color or contrast paint or tape to clearly mark and help you see:  ? Any grab bars or handrails.  ? First and last steps of stairways.  ? Where the edge of each step is.  · If you use a stepladder:  ? Make sure that it is fully opened. Do not climb a closed stepladder.  ? Make sure that both sides of the stepladder are locked into place.  ? Ask someone to hold the stepladder for you while you use it.  · If there are any pets around you, be aware of where they are.  What can I do in the bathroom?         · Keep the floor dry. Clean up any water that spills onto the floor as soon as it happens.  · Remove soap buildup in the tub or shower regularly.  · Use non-skid mats or decals on the floor of the tub or shower.  · Attach bath mats securely with double-sided, non-slip rug tape.  · If you need to sit down in the shower, use a plastic, non-slip stool.  · Install grab bars by the toilet and in the tub and shower. Do not use towel bars as grab bars.  What can I do in the bedroom?  · Make sure that you have a light by your bed that is easy to reach.  · Do not use any sheets or blankets that are too big for your bed. They should  not hang down onto the floor.  · Have a firm chair that has side arms. You can use this for support while you get dressed.  What can I do in the kitchen?  · Clean up any spills right away.  · If you need to reach something above you, use a strong step stool that has a grab bar.  · Keep electrical cords out of the way.  · Do not use floor polish or wax that makes floors slippery. If you must use wax, use non-skid floor wax.  What can I do with my stairs?  · Do not leave any items on the stairs.  · Make sure that you have a light switch at the top of the stairs and the bottom of the stairs. If you do not have them, ask someone to add them for you.  · Make sure that there are handrails on both sides of the stairs, and use them. Fix handrails that are broken or loose. Make sure that handrails are as long as the stairways.  ·   Install non-slip stair treads on all stairs in your home.  · Avoid having throw rugs at the top or bottom of the stairs. If you do have throw rugs, attach them to the floor with carpet tape.  · Choose a carpet that does not hide the edge of the steps on the stairway.  · Check any carpeting to make sure that it is firmly attached to the stairs. Fix any carpet that is loose or worn.  What can I do on the outside of my home?  · Use bright outdoor lighting.  · Regularly fix the edges of walkways and driveways and fix any cracks.  · Remove anything that might make you trip as you walk through a door, such as a raised step or threshold.  · Trim any bushes or trees on the path to your home.  · Regularly check to see if handrails are loose or broken. Make sure that both sides of any steps have handrails.  · Install guardrails along the edges of any raised decks and porches.  · Clear walking paths of anything that might make someone trip, such as tools or rocks.  · Have any leaves, snow, or ice cleared regularly.  · Use sand or salt on walking paths during winter.  · Clean up any spills in your garage right  away. This includes grease or oil spills.  What other actions can I take?  · Wear shoes that:  ? Have a low heel. Do not wear high heels.  ? Have rubber bottoms.  ? Are comfortable and fit you well.  ? Are closed at the toe. Do not wear open-toe sandals.  · Use tools that help you move around (mobility aids) if they are needed. These include:  ? Canes.  ? Walkers.  ? Scooters.  ? Crutches.  · Review your medicines with your doctor. Some medicines can make you feel dizzy. This can increase your chance of falling.  Ask your doctor what other things you can do to help prevent falls.  Where to find more information  · Centers for Disease Control and Prevention, STEADI: https://cdc.gov  · National Institute on Aging: https://go4life.nia.nih.gov  Contact a doctor if:  · You are afraid of falling at home.  · You feel weak, drowsy, or dizzy at home.  · You fall at home.  Summary  · There are many simple things that you can do to make your home safe and to help prevent falls.  · Ways to make your home safe include removing tripping hazards and installing grab bars in the bathroom.  · Ask for help when making these changes in your home.  This information is not intended to replace advice given to you by your health care provider. Make sure you discuss any questions you have with your health care provider.  Document Released: 02/02/2009 Document Revised: 11/21/2016 Document Reviewed: 11/21/2016  Elsevier Interactive Patient Education © 2019 Elsevier Inc.

## 2018-09-10 LAB — CMP14+EGFR
ALT: 10 IU/L (ref 0–32)
AST: 10 IU/L (ref 0–40)
Albumin/Globulin Ratio: 2.1 (ref 1.2–2.2)
Albumin: 4.4 g/dL (ref 3.7–4.7)
Alkaline Phosphatase: 84 IU/L (ref 39–117)
BUN/Creatinine Ratio: 18 (ref 12–28)
BUN: 17 mg/dL (ref 8–27)
Bilirubin Total: 0.4 mg/dL (ref 0.0–1.2)
CO2: 28 mmol/L (ref 20–29)
Calcium: 9.6 mg/dL (ref 8.7–10.3)
Chloride: 100 mmol/L (ref 96–106)
Creatinine, Ser: 0.93 mg/dL (ref 0.57–1.00)
GFR calc Af Amer: 69 mL/min/{1.73_m2} (ref >59)
GFR calc non Af Amer: 60 mL/min/{1.73_m2} (ref >59)
Globulin, Total: 2.1 g/dL (ref 1.5–4.5)
Glucose: 86 mg/dL (ref 65–99)
Potassium: 3.9 mmol/L (ref 3.5–5.2)
Sodium: 142 mmol/L (ref 134–144)
Total Protein: 6.5 g/dL (ref 6.0–8.5)

## 2018-09-10 LAB — LIPID PANEL
Chol/HDL Ratio: 1.9 ratio (ref 0.0–4.4)
Cholesterol, Total: 131 mg/dL (ref 100–199)
HDL: 69 mg/dL (ref >39)
LDL Calculated: 44 mg/dL (ref 0–99)
Triglycerides: 92 mg/dL (ref 0–149)
VLDL Cholesterol Cal: 18 mg/dL (ref 5–40)

## 2018-09-18 ENCOUNTER — Ambulatory Visit (INDEPENDENT_AMBULATORY_CARE_PROVIDER_SITE_OTHER): Payer: Medicare Other | Admitting: *Deleted

## 2018-09-18 ENCOUNTER — Other Ambulatory Visit: Payer: Self-pay

## 2018-09-18 DIAGNOSIS — Z0000 Encounter for general adult medical examination without abnormal findings: Secondary | ICD-10-CM | POA: Diagnosis not present

## 2018-09-18 NOTE — Patient Instructions (Signed)
Preventive Care 77 Years and Older, Female Preventive care refers to lifestyle choices and visits with your health care provider that can promote health and wellness. What does preventive care include?  A yearly physical exam. This is also called an annual well check.  Dental exams once or twice a year.  Routine eye exams. Ask your health care provider how often you should have your eyes checked.  Personal lifestyle choices, including: ? Daily care of your teeth and gums. ? Regular physical activity. ? Eating a healthy diet. ? Avoiding tobacco and drug use. ? Limiting alcohol use. ? Practicing safe sex. ? Taking low-dose aspirin every day. ? Taking vitamin and mineral supplements as recommended by your health care provider. What happens during an annual well check? The services and screenings done by your health care provider during your annual well check will depend on your age, overall health, lifestyle risk factors, and family history of disease. Counseling Your health care provider may ask you questions about your:  Alcohol use.  Tobacco use.  Drug use.  Emotional well-being.  Home and relationship well-being.  Sexual activity.  Eating habits.  History of falls.  Memory and ability to understand (cognition).  Work and work Statistician.  Reproductive health.  Screening You may have the following tests or measurements:  Height, weight, and BMI.  Blood pressure.  Lipid and cholesterol levels. These may be checked every 5 years, or more frequently if you are over 30 years old.  Skin check.  Lung cancer screening. You may have this screening every year starting at age 27 if you have a 30-pack-year history of smoking and currently smoke or have quit within the past 15 years.  Colorectal cancer screening. All adults should have this screening starting at age 33 and continuing until age 46. You will have tests every 1-10 years, depending on your results and the  type of screening test. People at increased risk should start screening at an earlier age. Screening tests may include: ? Guaiac-based fecal occult blood testing. ? Fecal immunochemical test (FIT). ? Stool DNA test. ? Virtual colonoscopy. ? Sigmoidoscopy. During this test, a flexible tube with a tiny camera (sigmoidoscope) is used to examine your rectum and lower colon. The sigmoidoscope is inserted through your anus into your rectum and lower colon. ? Colonoscopy. During this test, a long, thin, flexible tube with a tiny camera (colonoscope) is used to examine your entire colon and rectum.  Hepatitis C blood test.  Hepatitis B blood test.  Sexually transmitted disease (STD) testing.  Diabetes screening. This is done by checking your blood sugar (glucose) after you have not eaten for a while (fasting). You may have this done every 1-3 years.  Bone density scan. This is done to screen for osteoporosis. You may have this done starting at age 37.  Mammogram. This may be done every 1-2 years. Talk to your health care provider about how often you should have regular mammograms. Talk with your health care provider about your test results, treatment options, and if necessary, the need for more tests. Vaccines Your health care provider may recommend certain vaccines, such as:  Influenza vaccine. This is recommended every year.  Tetanus, diphtheria, and acellular pertussis (Tdap, Td) vaccine. You may need a Td booster every 10 years.  Varicella vaccine. You may need this if you have not been vaccinated.  Zoster vaccine. You may need this after age 38.  Measles, mumps, and rubella (MMR) vaccine. You may need at least  one dose of MMR if you were born in 1957 or later. You may also need a second dose.  Pneumococcal 13-valent conjugate (PCV13) vaccine. One dose is recommended after age 24.  Pneumococcal polysaccharide (PPSV23) vaccine. One dose is recommended after age 24.  Meningococcal  vaccine. You may need this if you have certain conditions.  Hepatitis A vaccine. You may need this if you have certain conditions or if you travel or work in places where you may be exposed to hepatitis A.  Hepatitis B vaccine. You may need this if you have certain conditions or if you travel or work in places where you may be exposed to hepatitis B.  Haemophilus influenzae type b (Hib) vaccine. You may need this if you have certain conditions. Talk to your health care provider about which screenings and vaccines you need and how often you need them. This information is not intended to replace advice given to you by your health care provider. Make sure you discuss any questions you have with your health care provider. Document Released: 05/05/2015 Document Revised: 05/29/2017 Document Reviewed: 02/07/2015 Elsevier Interactive Patient Education  2019 [employer].

## 2018-09-18 NOTE — Progress Notes (Signed)
MEDICARE ANNUAL WELLNESS VISIT  09/18/2018  Telephone Visit Disclaimer This Medicare AWV was conducted by telephone due to national recommendations for restrictions regarding the COVID-19 Pandemic (e.g. social distancing).  I verified, using two identifiers, that I am speaking with Teresa Holloway or their authorized healthcare agent. I discussed the limitations, risks, security, and privacy concerns of performing an evaluation and management service by telephone and the potential availability of an in-person appointment in the future. The patient expressed understanding and agreed to proceed.   Subjective:  Teresa Holloway is a 78 y.o. female patient of Teresa Holloway, Teresa Holloway who had a Medicare Annual Wellness Visit today via telephone. Teresa Holloway is Retired and lives with their spouse. she has 1 child. she reports that she is socially active and does interact with friends/family regularly. she is minimally physically active and enjoys watching TV and walking her dogs.  Patient Care Team: Teresa Pretty, FNP as PCP - General (Nurse Practitioner)  Advanced Directives 09/18/2018 11/06/2015  Does Patient Have a Medical Advance Directive? No No  Would patient like information on creating a medical advance directive? No - Patient declined [facility] Utilization Over the Past 12 Months: # of hospitalizations or ER visits: 0 # of surgeries: 0  Review of Systems    Patient reports that her overall health is worse due to her knee pain. She is waiting to have knee replacement. compared to last year.  Patient Reported Readings (BP, Pulse, CBG, Weight, etc) none  Review of Systems: No complaints  All other systems negative.  Pain Assessment Pain : No/denies pain     Current Medications & Allergies (verified) Allergies as of 09/18/2018      Reactions   Atarax [hydroxyzine]    Bactrim [sulfamethoxazole-trimethoprim] Nausea Only   Coreg [carvedilol]       Medication  List       Accurate as of Sep 18, 2018  3:47 PM. If you have any questions, ask your nurse or doctor.        alendronate 70 MG tablet Commonly known as:  FOSAMAX TAKE 1 TABLET (70 MG TOTAL) BY MOUTH EVERY 7 (SEVEN) DAYS. TAKE WITH A FULL GLASS OF WATER ON AN EMPTY STOMACH.   amLODipine 10 MG tablet Commonly known as:  NORVASC Take 1 tablet (10 mg total) by mouth daily.   aspirin 81 MG tablet Take 81 mg by mouth daily.   atorvastatin 40 MG tablet Commonly known as:  LIPITOR Take 1 tablet (40 mg total) by mouth daily at 6 PM.   benazepril 40 MG tablet Commonly known as:  LOTENSIN Take 1 tablet (40 mg total) by mouth daily.   cholecalciferol 1000 units tablet Commonly known as:  VITAMIN D Take 1,000 Units by mouth daily.   clopidogrel 75 MG tablet Commonly known as:  PLAVIX Take 1 tablet (75 mg total) by mouth daily.   [facility] raNITIdine 75 MG tablet Generic drug:  ranitidine TAKE 1 TABLET (75 MG TOTAL) BY MOUTH DAILY.   diclofenac sodium 1 % Gel Commonly known as:  VOLTAREN Apply 2 g topically 4 (four) times daily.   hydrochlorothiazide 25 MG tablet Commonly known as:  HYDRODIURIL Take 1 tablet (25 mg total) by mouth daily.   HYDROcodone-acetaminophen 5-325 MG tablet Commonly known as:  Norco Take 1 tablet by mouth 2 (two) times daily for 30 days.   HYDROcodone-acetaminophen 5-325 MG tablet Commonly known as:  Norco Take 1 tablet by mouth every 6 (six)  hours as needed for up to 30 days for moderate pain. Start taking on:  October 13, 2018   HYDROcodone-acetaminophen 5-325 MG tablet Commonly known as:  Norco Take 1 tablet by mouth every 6 (six) hours as needed for up to 30 days for moderate pain. Start taking on:  December 12, 2018   isosorbide mononitrate 60 MG 24 hr tablet Commonly known as:  IMDUR Take 1 tablet (60 mg total) by mouth daily.   latanoprost 0.005 % ophthalmic solution Commonly known as:  XALATAN Place 1 drop into both eyes at bedtime.   Lumigan  0.01 % Soln Generic drug:  bimatoprost USE 1 DROP AT BEDTIME INTO BOTH EYES   methocarbamol 500 MG tablet Commonly known as:  ROBAXIN TAKE 1 TABLET (500 MG TOTAL) BY MOUTH 4 (FOUR) TIMES DAILY.   metoprolol succinate 100 MG 24 hr tablet Commonly known as:  TOPROL-XL TAKE 1/2 TABLET DAILY   Nitrostat 0.4 MG SL tablet Generic drug:  nitroGLYCERIN Place 0.4 mg under the tongue every 5 (five) minutes as needed for chest pain.   PARoxetine 40 MG tablet Commonly known as:  PAXIL Take 1 tablet (40 mg total) by mouth daily.       History (reviewed): Past Medical History:  Diagnosis Date  . Arthritis   . CAD (coronary artery disease)   . Chronic back pain   . GERD (gastroesophageal reflux disease)   . Hyperlipidemia   . Hypertension   . MI (myocardial infarction) ([place])   . Osteopenia    Past Surgical History:  Procedure Laterality Date  . ECTOPIC PREGNANCY SURGERY    . left breast biospy    . left wrist fracture     Family History  Problem Relation Age of Onset  . Asthma Father   . Diabetes Sister   . Diabetes Brother    Social History   Socioeconomic History  . Marital status: Married    Spouse name: Teresa Holloway  . Number of children: 1  . Years of education: Not on file  . Highest education level: 10th grade  Occupational History  . Occupation: Retired  Scientific laboratory technician  . Financial resource strain: Not on file  . Food insecurity:    Worry: Never true    Inability: Never true  . Transportation needs:    Medical: No    Non-medical: No  Tobacco Use  . Smoking status: Never Smoker  . Smokeless tobacco: Never Used  Substance and Sexual Activity  . Alcohol use: No  . Drug use: No  . Sexual activity: Not Currently    Birth control/protection: Post-menopausal  Lifestyle  . Physical activity:    Days per week: 4 days    Minutes per session: 30 min  . Stress: Not at all  Relationships  . Social connections:    Talks on phone: More than three times a week    Gets  together: Twice a week    Attends religious service: Never    Active member of club or organization: No    Attends meetings of clubs or organizations: Never    Relationship status: Married  Other Topics Concern  . Not on file  Social History Narrative  . Not on file    Activities of Daily Living In your present state of health, do you have any difficulty performing the following activities: 09/18/2018  Hearing? N  Vision? N  Difficulty concentrating or making decisions? N  Walking or climbing stairs? Y  Comment due to left knee  pain  Dressing or bathing? N  Doing errands, shopping? N  Preparing Food and eating ? N  Using the Toilet? N  In the past six months, have you accidently leaked urine? Y  Comment wears a pad  Do you have problems with loss of bowel control? N  Managing your Medications? N  Managing your Finances? N  Housekeeping or managing your Housekeeping? N  Some recent data might be hidden    Patient Literacy How often do you need to have someone help you when you read instructions, pamphlets, or other written materials from your doctor or pharmacy?: 1 - Never What is the last grade level you completed in school?: 10th grade  Exercise Current Exercise Habits: Home exercise routine, Type of exercise: walking, Time (Minutes): 30, Frequency (Times/Week): 4, Weekly Exercise (Minutes/Week): 120, Intensity: Mild, Exercise limited by: orthopedic condition(s)  Diet Patient reports consuming 2 meals a day and 1 snack(s) a day Patient reports that her primary diet is: Regular Patient reports that she does have regular access to food.   Depression Screen PHQ 2/9 Scores 09/18/2018 09/09/2018 06/30/2018 06/16/2018 03/13/2018 12/11/2017 10/30/2017  PHQ - 2 Score 0 0 0 0 0 0 0     Fall Risk Fall Risk  09/18/2018 09/09/2018 06/30/2018 06/16/2018 03/13/2018  Falls in the past year? 0 0 0 0 0  Number falls in past yr: - - - - -  Injury with Fall? - - - - -     Objective:   Teresa Holloway seemed alert and oriented and she participated appropriately during our telephone visit.  Blood Pressure Weight BMI  BP Readings from Last 3 Encounters:  09/09/18 135/69  06/30/18 116/66  06/16/18 118/63   Wt Readings from Last 3 Encounters:  09/09/18 166 lb (75.3 kg)  06/30/18 168 lb (76.2 kg)  06/16/18 168 lb (76.2 kg)   BMI Readings from Last 1 Encounters:  09/09/18 28.49 kg/m    *Unable to obtain current vital signs, weight, and BMI due to telephone visit type  Hearing/Vision  . Delight did not seem to have difficulty with hearing/understanding during the telephone conversation . Reports that she has not had a formal eye exam by an eye care professional within the past year . Reports that she has not had a formal hearing evaluation within the past year *Unable to fully assess hearing and vision during telephone visit type  Cognitive Function: 6CIT Screen 09/18/2018  What Year? 0 points  What month? 0 points  What time? 0 points  Count back from 20 0 points  Months in reverse 2 points  Repeat phrase 4 points  Total Score 6    Normal Cognitive Function Screening: Yes (Normal:0-7, Significant for Dysfunction: >8)  Immunization & Health Maintenance Record Immunization History  Administered Date(s) Administered  . Influenza, High Dose Seasonal PF 02/22/2016, 03/07/2017, 03/13/2018  . Influenza,inj,Quad PF,6+ Mos 02/20/2015  . Pneumococcal Conjugate-13 07/01/2014  . Pneumococcal Polysaccharide-23 07/14/2015  . Zoster 12/30/2013    Health Maintenance  Topic Date Due  . TETANUS/TDAP  06/20/2017  . MAMMOGRAM  06/17/2019 (Originally 09/25/2015)  . INFLUENZA VACCINE  11/21/2018  . DEXA SCAN  Completed  . PNA vac Low Risk Adult  Completed       Assessment  This is a routine wellness examination for Teresa Holloway.  Health Maintenance: Due or Overdue Health Maintenance Due  Topic Date Due  . TETANUS/TDAP  06/20/2017    Teresa Holloway does  not need a referral  for Community Assistance: Care Management:   no Social Work:    no Prescription Assistance:  no Nutrition/Diabetes Education:  no   Plan:  Personalized Goals Goals Addressed            This Visit's Progress   . DIET - INCREASE WATER INTAKE        Personalized Health Maintenance & Screening Recommendations  Td vaccine  Lung Cancer Screening Recommended: no (Low Dose CT Chest recommended if Age 73-80 years, 30 pack-year currently smoking OR have quit w/in past 15 years) Hepatitis C Screening recommended: no HIV Screening recommended: no  Advanced Directives: Written information was not prepared per patient's request.  Referrals & Orders No orders of the defined types were placed in this encounter.   Follow-up Plan . Follow-up with Teresa Pretty, FNP as planned . Consider updating TDAP at next visit with PCP.    I have personally reviewed and noted the following in the patient's chart:   . Medical and social history . Use of alcohol, tobacco or illicit drugs  . Current medications and supplements . Functional ability and status . Nutritional status . Physical activity . Advanced directives . List of other physicians . Hospitalizations, surgeries, and ER visits in previous 12 months . Vitals . Screenings to include cognitive, depression, and falls . Referrals and appointments  In addition, I have reviewed and discussed with Teresa Holloway certain preventive protocols, quality metrics, and best practice recommendations. A written personalized care plan for preventive services as well as general preventive health recommendations is available and can be mailed to the patient at her request.      Marylin Crosby  09/18/2018

## 2018-09-23 ENCOUNTER — Other Ambulatory Visit: Payer: Self-pay | Admitting: Nurse Practitioner

## 2018-09-23 DIAGNOSIS — Z7902 Long term (current) use of antithrombotics/antiplatelets: Secondary | ICD-10-CM | POA: Diagnosis not present

## 2018-09-23 DIAGNOSIS — R0789 Other chest pain: Secondary | ICD-10-CM | POA: Diagnosis not present

## 2018-09-23 DIAGNOSIS — R001 Bradycardia, unspecified: Secondary | ICD-10-CM | POA: Diagnosis not present

## 2018-09-23 DIAGNOSIS — I259 Chronic ischemic heart disease, unspecified: Secondary | ICD-10-CM | POA: Diagnosis not present

## 2018-09-23 DIAGNOSIS — Z7982 Long term (current) use of aspirin: Secondary | ICD-10-CM | POA: Diagnosis not present

## 2018-09-23 DIAGNOSIS — Z01810 Encounter for preprocedural cardiovascular examination: Secondary | ICD-10-CM | POA: Diagnosis not present

## 2018-09-23 DIAGNOSIS — I2510 Atherosclerotic heart disease of native coronary artery without angina pectoris: Secondary | ICD-10-CM | POA: Diagnosis not present

## 2018-09-23 DIAGNOSIS — I252 Old myocardial infarction: Secondary | ICD-10-CM | POA: Diagnosis not present

## 2018-09-23 DIAGNOSIS — Z79899 Other long term (current) drug therapy: Secondary | ICD-10-CM | POA: Diagnosis not present

## 2018-09-23 NOTE — Telephone Encounter (Signed)
°  What is the name of the medication? Nitroglycerin   Have you contacted your pharmacy to request a refill? yes  Which pharmacy would you like this sent to? [facility]   Patient notified that their request is being sent to the clinical staff for review and that they should receive a call once it is complete. If they do not receive a call within 24 hours they can check with their pharmacy or our office.

## 2018-09-24 MED ORDER — NITROGLYCERIN 0.4 MG SL SUBL: 0.4000 mg | SUBLINGUAL_TABLET | SUBLINGUAL | 1 refills | Status: AC | PRN

## 2018-10-13 DIAGNOSIS — H5213 Myopia, bilateral: Secondary | ICD-10-CM | POA: Diagnosis not present

## 2018-10-13 DIAGNOSIS — H259 Unspecified age-related cataract: Secondary | ICD-10-CM | POA: Diagnosis not present

## 2018-10-13 DIAGNOSIS — H40023 Open angle with borderline findings, high risk, bilateral: Secondary | ICD-10-CM | POA: Diagnosis not present

## 2018-11-08 ENCOUNTER — Other Ambulatory Visit: Payer: Self-pay | Admitting: Nurse Practitioner

## 2018-11-08 DIAGNOSIS — S161XXA Strain of muscle, fascia and tendon at neck level, initial encounter: Secondary | ICD-10-CM

## 2018-11-09 ENCOUNTER — Other Ambulatory Visit: Payer: Self-pay | Admitting: Nurse Practitioner

## 2018-11-09 DIAGNOSIS — S161XXA Strain of muscle, fascia and tendon at neck level, initial encounter: Secondary | ICD-10-CM

## 2018-11-09 NOTE — Telephone Encounter (Signed)
Pharmacy comment:  Alternative Requested: PLAN PREFERRES TIZANIDINE

## 2018-11-10 ENCOUNTER — Telehealth: Payer: Self-pay | Admitting: Nurse Practitioner

## 2018-11-10 ENCOUNTER — Other Ambulatory Visit: Payer: Self-pay | Admitting: Nurse Practitioner

## 2018-11-10 DIAGNOSIS — F1120 Opioid dependence, uncomplicated: Secondary | ICD-10-CM

## 2018-11-10 MED ORDER — HYDROCODONE-ACETAMINOPHEN 5-325 MG PO TABS: 1.0000 | ORAL_TABLET | Freq: Four times a day (QID) | ORAL | 0 refills | Status: DC | PRN

## 2018-11-10 NOTE — Telephone Encounter (Signed)
Dates corrected on the one rx and sent to pharmacy

## 2018-11-10 NOTE — Telephone Encounter (Signed)
What is the name of the medication? HYDROcodone-acetaminophen (NORCO) 5-325 MG tablet  Have you contacted your pharmacy to request a refill? Yes, pt states that she was suppose to have enough refills until next apt.   Which pharmacy would you [facility] sent to? [facility]   Patient notified that their request is being sent to the clinical staff for review and that they should receive a call once it is complete. If they do not receive a call within 24 hours they can check with their pharmacy or our office.

## 2018-11-12 ENCOUNTER — Other Ambulatory Visit: Payer: Self-pay | Admitting: Nurse Practitioner

## 2018-11-12 ENCOUNTER — Telehealth: Payer: Self-pay | Admitting: *Deleted

## 2018-11-12 DIAGNOSIS — F1120 Opioid dependence, uncomplicated: Secondary | ICD-10-CM

## 2018-11-12 DIAGNOSIS — S161XXA Strain of muscle, fascia and tendon at neck level, initial encounter: Secondary | ICD-10-CM

## 2018-11-12 MED ORDER — HYDROCODONE-ACETAMINOPHEN 5-325 MG PO TABS: 1.0000 | ORAL_TABLET | Freq: Four times a day (QID) | ORAL | 0 refills | Status: DC | PRN

## 2018-11-12 NOTE — Telephone Encounter (Signed)
Took care of

## 2018-11-12 NOTE — Telephone Encounter (Signed)
Fax from [facility] Please clarify sig

## 2018-11-15 ENCOUNTER — Other Ambulatory Visit: Payer: Self-pay | Admitting: Nurse Practitioner

## 2018-11-15 DIAGNOSIS — S161XXA Strain of muscle, fascia and tendon at neck level, initial encounter: Secondary | ICD-10-CM

## 2018-11-17 ENCOUNTER — Other Ambulatory Visit: Payer: Self-pay | Admitting: Nurse Practitioner

## 2018-11-17 DIAGNOSIS — S161XXA Strain of muscle, fascia and tendon at neck level, initial encounter: Secondary | ICD-10-CM

## 2018-11-17 MED ORDER — CYCLOBENZAPRINE HCL 5 MG PO TABS: ORAL_TABLET | ORAL | 1 refills | Status: DC

## 2018-11-19 ENCOUNTER — Other Ambulatory Visit: Payer: Self-pay | Admitting: Nurse Practitioner

## 2018-11-19 DIAGNOSIS — M810 Age-related osteoporosis without current pathological fracture: Secondary | ICD-10-CM

## 2018-11-20 DIAGNOSIS — H401412 Capsular glaucoma with pseudoexfoliation of lens, right eye, moderate stage: Secondary | ICD-10-CM | POA: Diagnosis not present

## 2018-11-20 DIAGNOSIS — H25812 Combined forms of age-related cataract, left eye: Secondary | ICD-10-CM | POA: Diagnosis not present

## 2018-11-20 DIAGNOSIS — H401421 Capsular glaucoma with pseudoexfoliation of lens, left eye, mild stage: Secondary | ICD-10-CM | POA: Diagnosis not present

## 2018-11-20 DIAGNOSIS — H25811 Combined forms of age-related cataract, right eye: Secondary | ICD-10-CM | POA: Diagnosis not present

## 2018-11-23 DIAGNOSIS — R0789 Other chest pain: Secondary | ICD-10-CM | POA: Diagnosis not present

## 2018-11-23 DIAGNOSIS — M25562 Pain in left knee: Secondary | ICD-10-CM | POA: Diagnosis not present

## 2018-11-23 DIAGNOSIS — Z01810 Encounter for preprocedural cardiovascular examination: Secondary | ICD-10-CM | POA: Diagnosis not present

## 2018-11-23 DIAGNOSIS — I259 Chronic ischemic heart disease, unspecified: Secondary | ICD-10-CM | POA: Diagnosis not present

## 2018-11-26 DIAGNOSIS — Z7901 Long term (current) use of anticoagulants: Secondary | ICD-10-CM | POA: Diagnosis not present

## 2018-11-26 DIAGNOSIS — S0990XA Unspecified injury of head, initial encounter: Secondary | ICD-10-CM | POA: Diagnosis not present

## 2018-11-26 DIAGNOSIS — S0083XA Contusion of other part of head, initial encounter: Secondary | ICD-10-CM | POA: Diagnosis not present

## 2018-11-26 DIAGNOSIS — M542 Cervicalgia: Secondary | ICD-10-CM | POA: Diagnosis not present

## 2018-11-26 DIAGNOSIS — Z7982 Long term (current) use of aspirin: Secondary | ICD-10-CM | POA: Diagnosis not present

## 2018-11-26 DIAGNOSIS — I2510 Atherosclerotic heart disease of native coronary artery without angina pectoris: Secondary | ICD-10-CM | POA: Diagnosis not present

## 2018-11-26 DIAGNOSIS — S199XXA Unspecified injury of neck, initial encounter: Secondary | ICD-10-CM | POA: Diagnosis not present

## 2018-11-26 DIAGNOSIS — W01198A Fall on same level from slipping, tripping and stumbling with subsequent striking against other object, initial encounter: Secondary | ICD-10-CM | POA: Diagnosis not present

## 2018-11-26 DIAGNOSIS — I252 Old myocardial infarction: Secondary | ICD-10-CM | POA: Diagnosis not present

## 2018-11-26 DIAGNOSIS — Z79899 Other long term (current) drug therapy: Secondary | ICD-10-CM | POA: Diagnosis not present

## 2018-11-26 DIAGNOSIS — I10 Essential (primary) hypertension: Secondary | ICD-10-CM | POA: Diagnosis not present

## 2018-11-26 DIAGNOSIS — Z7902 Long term (current) use of antithrombotics/antiplatelets: Secondary | ICD-10-CM | POA: Diagnosis not present

## 2018-12-03 DIAGNOSIS — M1712 Unilateral primary osteoarthritis, left knee: Secondary | ICD-10-CM | POA: Diagnosis not present

## 2018-12-10 ENCOUNTER — Ambulatory Visit (INDEPENDENT_AMBULATORY_CARE_PROVIDER_SITE_OTHER): Payer: Medicare Other | Admitting: Nurse Practitioner

## 2018-12-10 ENCOUNTER — Encounter: Payer: Self-pay | Admitting: Nurse Practitioner

## 2018-12-10 ENCOUNTER — Other Ambulatory Visit: Payer: Self-pay

## 2018-12-10 VITALS — BP 108/63 | HR 55 | Temp 96.8°F | Ht 64.0 in | Wt 166.0 lb

## 2018-12-10 DIAGNOSIS — K219 Gastro-esophageal reflux disease without esophagitis: Secondary | ICD-10-CM

## 2018-12-10 DIAGNOSIS — E785 Hyperlipidemia, unspecified: Secondary | ICD-10-CM

## 2018-12-10 DIAGNOSIS — I10 Essential (primary) hypertension: Secondary | ICD-10-CM | POA: Diagnosis not present

## 2018-12-10 DIAGNOSIS — I25111 Atherosclerotic heart disease of native coronary artery with angina pectoris with documented spasm: Secondary | ICD-10-CM

## 2018-12-10 DIAGNOSIS — F3342 Major depressive disorder, recurrent, in full remission: Secondary | ICD-10-CM

## 2018-12-10 DIAGNOSIS — F1120 Opioid dependence, uncomplicated: Secondary | ICD-10-CM

## 2018-12-10 DIAGNOSIS — M170 Bilateral primary osteoarthritis of knee: Secondary | ICD-10-CM

## 2018-12-10 DIAGNOSIS — Z6830 Body mass index (BMI) 30.0-30.9, adult: Secondary | ICD-10-CM

## 2018-12-10 MED ORDER — ATORVASTATIN CALCIUM 40 MG PO TABS: 40.0000 mg | ORAL_TABLET | Freq: Every day | ORAL | 1 refills | Status: DC

## 2018-12-10 MED ORDER — BENAZEPRIL HCL 40 MG PO TABS: 40.0000 mg | ORAL_TABLET | Freq: Every day | ORAL | 1 refills | Status: DC

## 2018-12-10 MED ORDER — HYDROCODONE-ACETAMINOPHEN 5-325 MG PO TABS: 1.0000 | ORAL_TABLET | Freq: Four times a day (QID) | ORAL | 0 refills | Status: DC | PRN

## 2018-12-10 MED ORDER — METOPROLOL SUCCINATE ER 100 MG PO TB24: ORAL_TABLET | ORAL | 1 refills | Status: DC

## 2018-12-10 MED ORDER — CLOPIDOGREL BISULFATE 75 MG PO TABS: 75.0000 mg | ORAL_TABLET | Freq: Every day | ORAL | 1 refills | Status: DC

## 2018-12-10 MED ORDER — HYDROCHLOROTHIAZIDE 25 MG PO TABS: 25.0000 mg | ORAL_TABLET | Freq: Every day | ORAL | 1 refills | Status: DC

## 2018-12-10 MED ORDER — ISOSORBIDE MONONITRATE ER 60 MG PO TB24: 60.0000 mg | ORAL_TABLET | Freq: Every day | ORAL | 1 refills | Status: DC

## 2018-12-10 MED ORDER — AMLODIPINE BESYLATE 10 MG PO TABS: 10.0000 mg | ORAL_TABLET | Freq: Every day | ORAL | 1 refills | Status: DC

## 2018-12-10 MED ORDER — PAROXETINE HCL 40 MG PO TABS: 40.0000 mg | ORAL_TABLET | Freq: Every day | ORAL | 1 refills | Status: DC

## 2018-12-10 MED ORDER — HYDROCODONE-ACETAMINOPHEN 5-325 MG PO TABS: 1.0000 | ORAL_TABLET | Freq: Two times a day (BID) | ORAL | 0 refills | Status: DC

## 2018-12-10 NOTE — Progress Notes (Signed)
Subjective:    Patient ID: Teresa Holloway, female    DOB: [birth year], 78 y.o.   MRN: [id number]   Chief Complaint: Medical Management of Chronic Issues    HPI:  1. Essential hypertension, benign No c/o chest pain, sob or headache. Does not check blood pressure at home. BP Readings from Last 3 Encounters:  12/10/18 108/63  09/09/18 135/69  06/30/18 116/66     2. Coronary artery disease involving native coronary artery of native heart with angina pectoris with documented spasm [facility]) Saw cardiology 12/02/18. According to note no changes were made to plan of care.  3. Gastroesophageal reflux disease without esophagitis Uses OTC meds as needed.  4. Hyperlipidemia with target LDL less than 100 Tries to watch diet but is not able to do exercise due to knee pain  5. Recurrent major depressive disorder, in full remission [facility]) Patient is on paxil and is doing well. Depression screen [facility] 2/9 12/10/2018 09/18/2018 09/09/2018  Decreased Interest 0 0 0  Down, Depressed, Hopeless 0 0 0  PHQ - 2 Score 0 0 0     6. Arthritis of both knees Needs bil knee replacement- she is seeing ortho and may schedule to have doen soon  7. Opioid type dependence, continuous (HCC) Pain assessment: Cause of pain- arthritis  Pain location- bil knees Pain on scale of 1-10- 5-10/10 Frequency- daily What increases pain-to much walkng What makes pain Better-pain meds help Effects on ADL - none Any change in general medical condition- none  Current opioids rx- norco 5/325 # meds rx- 60 Effectiveness of current meds-helps Adverse reactions form pain meds-none Morphine equivalent- 20 MEDD  Pill count performed-No Last drug screen - never ( high risk q91m, moderate risk q104m, low risk yearly ) Urine drug screen today- Yes Was the [facility] reviewed- yes  If yes were their any concerning findings? - none   Pain contract signed on: 03/18/18   8. BMI 30.0-30.9,adult No recent weight changes     Outpatient Encounter Medications as of 12/10/2018  Medication Sig  . alendronate (FOSAMAX) 70 MG tablet TAKE 1 TABLE BY MOUTH EVERY 7 DAYS. TAKE WITH A FULL GLASS OF WATER ON AN EMPTY STOMACH.  Marland Kitchen amLODipine (NORVASC) 10 MG tablet Take 1 tablet (10 mg total) by mouth daily.  Marland Kitchen aspirin 81 MG tablet Take 81 mg by mouth daily.  Marland Kitchen atorvastatin (LIPITOR) 40 MG tablet Take 1 tablet (40 mg total) by mouth daily at 6 PM.  . benazepril (LOTENSIN) 40 MG tablet Take 1 tablet (40 mg total) by mouth daily.  . cholecalciferol (VITAMIN D) 1000 UNITS tablet Take 1,000 Units by mouth daily.  . clopidogrel (PLAVIX) 75 MG tablet Take 1 tablet (75 mg total) by mouth daily.  . [facility] RANITIDINE 75 MG tablet TAKE 1 TABLET (75 MG TOTAL) BY MOUTH DAILY.  . cyclobenzaprine (FLEXERIL) 5 MG tablet 1 po tid prn  . diclofenac sodium (VOLTAREN) 1 % GEL Apply 2 g topically 4 (four) times daily.  . hydrochlorothiazide (HYDRODIURIL) 25 MG tablet Take 1 tablet (25 mg total) by mouth daily.  Marland Kitchen HYDROcodone-acetaminophen (NORCO) 5-325 MG tablet Take 1 tablet by mouth every 6 (six) hours as needed for moderate pain.  . isosorbide mononitrate (IMDUR) 60 MG 24 hr tablet Take 1 tablet (60 mg total) by mouth daily.  Marland Kitchen latanoprost (XALATAN) 0.005 % ophthalmic solution Place 1 drop into both eyes at bedtime.  Marland Kitchen LUMIGAN 0.01 % SOLN USE 1 DROP AT BEDTIME INTO BOTH EYES  .  metoprolol succinate (TOPROL-XL) 100 MG 24 hr tablet TAKE 1/2 TABLET DAILY  . nitroGLYCERIN (NITROSTAT) 0.4 MG SL tablet Place 1 tablet (0.4 mg total) under the tongue every 5 (five) minutes as needed for chest pain.  Marland Kitchen PARoxetine (PAXIL) 40 MG tablet Take 1 tablet (40 mg total) by mouth daily.  Marland Kitchen HYDROcodone-acetaminophen (NORCO) 5-325 MG tablet Take 1 tablet by mouth every 6 (six) hours as needed for up to 30 days for moderate pain.  Marland Kitchen HYDROcodone-acetaminophen (NORCO) 5-325 MG tablet Take 1 tablet by mouth 2 (two) times daily for 30 days.   No facility-administered  encounter medications on file as of 12/10/2018.     Past Surgical History:  Procedure Laterality Date  . ECTOPIC PREGNANCY SURGERY    . left breast biospy    . left wrist fracture      Family History  Problem Relation Age of Onset  . Asthma Father   . Diabetes Sister   . Diabetes Brother     New complaints: none  Social history: Lives with husband   .  Review of Systems  Constitutional: Negative for activity change and appetite change.  HENT: Negative.   Eyes: Negative for pain.  Respiratory: Negative for shortness of breath.   Cardiovascular: Negative for chest pain, palpitations and leg swelling.  Gastrointestinal: Negative for abdominal pain.  Endocrine: Negative for polydipsia.  Genitourinary: Negative.   Skin: Negative for rash.  Neurological: Negative for dizziness, weakness and headaches.  Hematological: Does not bruise/bleed easily.  Psychiatric/Behavioral: Negative.   All other systems reviewed and are negative.      Objective:   Physical Exam Vitals signs and nursing note reviewed.  Constitutional:      General: She is not in acute distress.    Appearance: Normal appearance. She is well-developed.  HENT:     Head: Normocephalic.     Nose: Nose normal.  Eyes:     Pupils: Pupils are equal, round, and reactive to light.  Neck:     Musculoskeletal: Normal range of motion and neck supple.     Vascular: No carotid bruit or JVD.  Cardiovascular:     Rate and Rhythm: Normal rate and regular rhythm.     Heart sounds: Normal heart sounds.  Pulmonary:     Effort: Pulmonary effort is normal. No respiratory distress.     Breath sounds: Normal breath sounds. No wheezing or rales.  Chest:     Chest wall: No tenderness.  Abdominal:     General: Bowel sounds are normal. There is no distension or abdominal bruit.     Palpations: Abdomen is soft. There is no hepatomegaly, splenomegaly, mass or pulsatile mass.     Tenderness: There is no abdominal tenderness.   Musculoskeletal: Normal range of motion.  Lymphadenopathy:     Cervical: No cervical adenopathy.  Skin:    General: Skin is warm and dry.  Neurological:     Mental Status: She is alert and oriented to person, place, and time.     Deep Tendon Reflexes: Reflexes are normal and symmetric.  Psychiatric:        Behavior: Behavior normal.        Thought Content: Thought content normal.        Judgment: Judgment normal.     BP 108/63   Pulse (!) 55   Temp (!) 96.8 F (36 C) (Oral)   Ht 5\' 4"  (1.626 m)   Wt 166 lb (75.3 kg)   BMI 28.49 kg/m  Assessment & Plan:  .Teresa Holloway comes in today with chief complaint of Medical Management of Chronic Issues   Diagnosis and orders addressed:  1. Essential hypertension, benign Low sodium diet - benazepril (LOTENSIN) 40 MG tablet; Take 1 tablet (40 mg total) by mouth daily.  Dispense: 90 tablet; Refill: 1 - hydrochlorothiazide (HYDRODIURIL) 25 MG tablet; Take 1 tablet (25 mg total) by mouth daily.  Dispense: 90 tablet; Refill: 1 - amLODipine (NORVASC) 10 MG tablet; Take 1 tablet (10 mg total) by mouth daily.  Dispense: 90 tablet; Refill: 1 - CMP14+EGFR  2. Coronary artery disease involving native coronary artery of native heart with angina pectoris with documented spasm (HCC) Keep follow up with cardiology - clopidogrel (PLAVIX) 75 MG tablet; Take 1 tablet (75 mg total) by mouth daily.  Dispense: 90 tablet; Refill: 1 - isosorbide mononitrate (IMDUR) 60 MG 24 hr tablet; Take 1 tablet (60 mg total) by mouth daily.  Dispense: 90 tablet; Refill: 1 - metoprolol succinate (TOPROL-XL) 100 MG 24 hr tablet; TAKE 1/2 TABLET DAILY  Dispense: 90 tablet; Refill: 1  3. Gastroesophageal reflux disease without esophagitis Avoid spicy foods Do not eat 2 hours Holloway to bedtime  4. Hyperlipidemia with target LDL less than 100 Low fat diet - atorvastatin (LIPITOR) 40 MG tablet; Take 1 tablet (40 mg total) by mouth daily at 6 PM.  Dispense:  90 tablet; Refill: 1 - Lipid panel  5. Recurrent major depressive disorder, in full remission (HCC) Stress management - PARoxetine (PAXIL) 40 MG tablet; Take 1 tablet (40 mg total) by mouth daily.  Dispense: 90 tablet; Refill: 1  6. Arthritis of both knees Will probably schedule total knee replacement in the next couple of months  7. Opioid type dependence, continuous (HCC) - HYDROcodone-acetaminophen (NORCO) 5-325 MG tablet; Take 1 tablet by mouth every 6 (six) hours as needed for moderate pain.  Dispense: 60 tablet; Refill: 0 - HYDROcodone-acetaminophen (NORCO) 5-325 MG tablet; Take 1 tablet by mouth every 6 (six) hours as needed for moderate pain.  Dispense: 60 tablet; Refill: 0 - HYDROcodone-acetaminophen (NORCO) 5-325 MG tablet; Take 1 tablet by mouth 2 (two) times daily.  Dispense: 60 tablet; Refill: 0  8. BMI 30.0-30.9,adult Discussed diet and exercise for person with BMI >25 Will recheck weight in 3-6 months   Labs pending Health Maintenance reviewed Diet and exercise encouraged  Follow up plan: 3 months   Mary-Margaret Daphine Deutscher, FNP

## 2018-12-10 NOTE — Patient Instructions (Signed)
Total Knee Replacement  Total knee replacement is a surgery to replace a bad knee joint with a man-made (prosthetic) joint. The man-made joint is called a prosthesis. This joint may be made of plastic, metal, or ceramic parts. It replaces parts of the thigh bone (femur), lower leg bone (tibia), and kneecap (patella). This is done to reduce pain and help you move better. Tell your doctor about:  Any allergies you have.  All medicines you are taking. This includes vitamins, herbs, eye drops, creams, and over-the-counter medicines.  Any problems you or family members have had with anesthetic medicines.  Any blood disorders you have.  Any surgeries you have had.  Any medical conditions you have.  Whether you are pregnant or may be pregnant. What are the risks? Generally, this is a safe procedure. However, problems may occur, such as:  Infection.  Bleeding.  Blood clot.  Allergic reaction to medicines.  Damage to nerves and other parts of the knee.  Not being able to move your knee as much.  A feeling that the knee is weak or unstable.  Loosening of the new joint.  Pain that does not go away (chronic pain). What happens before the procedure? Staying hydrated Follow instructions from your doctor about hydration. These may include:  Up to 2 hours before the procedure - you may continue to drink clear liquids. These include water, clear fruit juice, black coffee, and plain tea.  Eating and drinking Follow instructions from your doctor about eating and drinking. These may include:  8 hours before the procedure - stop eating heavy meals or foods. These include meat, fried foods, or fatty foods.  6 hours before the procedure - stop eating light meals or foods. These include toast or cereal.  6 hours before the procedure - stop drinking milk or drinks that contain milk.  2 hours before the procedure - stop drinking clear liquids. Medicines Ask your doctor about:  Changing  or stopping your normal medicines. This is important.  Taking aspirin and ibuprofen. Do not take these medicines unless your doctor tells you to take them.  Taking over-the-counter medicines, vitamins, herbs, and supplements. Tests and exams  You may have a physical exam.  You may have tests, such as: ? X-rays. ? MRI. ? CT scan. ? Bone scans.  You may have a blood or urine sample taken. Lifestyle   If your doctor tells you to do physical therapy, do exercises as told.  Keep your body and teeth clean. Germs from anywhere in your body can infect your new joint. Tell your doctor if: ? You plan to have dental care and routine cleanings. ? You get any skin infections.  If you are overweight, work with your doctor to reach a safe weight. Extra weight can affect your knee.  Do not use any products that contain nicotine or tobacco, such as cigarettes, e-cigarettes, and chewing tobacco. These can delay healing. If you need help quitting, ask your doctor. General instructions  Plan to have someone take you home from the hospital or clinic.  If you will be going home right after the procedure, plan to have someone with you for at least 24 hours. It is best to have someone to help care for you for at least 4-6 weeks after surgery.  Do not shave your legs just before surgery. This will be done in the hospital if needed.  Ask your doctor: ? How your surgery site will be marked. ? What steps will be  taken to help prevent the spread of germs. These may include:  Removing hair at the surgery site.  Washing skin with a germ-killing soap. What happens during the procedure?  An IV tube will be put into one of your veins.  You will be given one or more of the following: ? A medicine to help you relax (sedative). ? A medicine to numb everything below the injection site (peripheral nerve block). ? A medicine that numbs your body below the waist (spinal anesthetic). ? A medicine to make  you fall asleep (general anesthetic).  A cut (incision) will be made in your knee.  Damaged parts of your thigh bone, lower leg bone, and kneecap will be removed.  Parts of the new joint will be placed on your thigh bone, your lower leg bone, and the underside of your kneecap.  One or more small tubes (drains) may be placed near your cut. This will help to drain fluid.  Your cut will be closed. Your doctor will use stitches (sutures), skin glue, or skin tape (adhesive) strips.  A bandage (dressing) will be placed over your cut. The procedure may vary among doctors and hospitals. What happens after the procedure?  You will be monitored until you leave the hospital or clinic. Your blood pressure, heart rate, breathing rate, and blood oxygen level will be checked.  You will be given medicines for pain.  You may: ? Keep getting fluids through an IV tube. ? Have fluid coming from the drain. ? Have to wear special socks (compression stockings). ? Be given a knee joint motion machine (continuous passive motion machine) to use at home.  You will be told to move around as much as you can.  Do not drive until your health care provider tells you it is okay. Summary  Total knee replacement is a surgery to replace a bad knee joint with a man-made joint.  Before the procedure, follow what you are told about eating, drinking, and taking medicines.  Plan to have someone take you home from the hospital or clinic. This information is not intended to replace advice given to you by your health care provider. Make sure you discuss any questions you have with your health care provider. Document Released: 07/01/2011 Document Revised: 11/20/2017 Document Reviewed: 11/20/2017 Elsevier Patient Education  2020 [employer].

## 2018-12-10 NOTE — Addendum Note (Signed)
Addended by: Rolena Infante on: 12/10/2018 11:15 AM   Modules accepted: Orders

## 2018-12-11 LAB — LIPID PANEL
Chol/HDL Ratio: 2 ratio (ref 0.0–4.4)
Cholesterol, Total: 125 mg/dL (ref 100–199)
HDL: 62 mg/dL (ref >39)
LDL Calculated: 42 mg/dL (ref 0–99)
Triglycerides: 104 mg/dL (ref 0–149)
VLDL Cholesterol Cal: 21 mg/dL (ref 5–40)

## 2018-12-11 LAB — CMP14+EGFR
ALT: 9 IU/L (ref 0–32)
AST: 12 IU/L (ref 0–40)
Albumin/Globulin Ratio: 2.2 (ref 1.2–2.2)
Albumin: 4.4 g/dL (ref 3.7–4.7)
Alkaline Phosphatase: 85 IU/L (ref 39–117)
BUN/Creatinine Ratio: 17 (ref 12–28)
BUN: 18 mg/dL (ref 8–27)
Bilirubin Total: 0.5 mg/dL (ref 0.0–1.2)
CO2: 28 mmol/L (ref 20–29)
Calcium: 10 mg/dL (ref 8.7–10.3)
Chloride: 99 mmol/L (ref 96–106)
Creatinine, Ser: 1.09 mg/dL — ABNORMAL HIGH (ref 0.57–1.00)
GFR calc Af Amer: 57 mL/min/{1.73_m2} — ABNORMAL LOW (ref >59)
GFR calc non Af Amer: 49 mL/min/{1.73_m2} — ABNORMAL LOW (ref >59)
Globulin, Total: 2 g/dL (ref 1.5–4.5)
Glucose: 98 mg/dL (ref 65–99)
Potassium: 4 mmol/L (ref 3.5–5.2)
Sodium: 143 mmol/L (ref 134–144)
Total Protein: 6.4 g/dL (ref 6.0–8.5)

## 2018-12-12 ENCOUNTER — Other Ambulatory Visit: Payer: Self-pay | Admitting: Nurse Practitioner

## 2018-12-12 DIAGNOSIS — S161XXA Strain of muscle, fascia and tendon at neck level, initial encounter: Secondary | ICD-10-CM

## 2018-12-12 DIAGNOSIS — I25111 Atherosclerotic heart disease of native coronary artery with angina pectoris with documented spasm: Secondary | ICD-10-CM

## 2018-12-13 ENCOUNTER — Other Ambulatory Visit: Payer: Self-pay | Admitting: Nurse Practitioner

## 2018-12-13 DIAGNOSIS — M810 Age-related osteoporosis without current pathological fracture: Secondary | ICD-10-CM

## 2018-12-13 LAB — TOXASSURE SELECT 13 (MW), URINE

## 2018-12-14 ENCOUNTER — Ambulatory Visit: Payer: Self-pay | Admitting: Nurse Practitioner

## 2018-12-15 ENCOUNTER — Other Ambulatory Visit: Payer: Self-pay | Admitting: Nurse Practitioner

## 2018-12-15 DIAGNOSIS — S161XXA Strain of muscle, fascia and tendon at neck level, initial encounter: Secondary | ICD-10-CM

## 2018-12-15 MED ORDER — CYCLOBENZAPRINE HCL 5 MG PO TABS: 5.0000 mg | ORAL_TABLET | Freq: Three times a day (TID) | ORAL | 0 refills | Status: DC

## 2018-12-17 ENCOUNTER — Telehealth: Payer: Self-pay | Admitting: *Deleted

## 2018-12-17 NOTE — Telephone Encounter (Addendum)
Prior Auth for Cyclobenzaprine HCL 5mg -APPROVED from 09/18/2018 - 03/17/2019   Key: [id number]   PA Case ID: [id number]    Your information has been submitted to [employer]. [employer] will review the request and will issue a decision, typically within 1-3 days from your submission. You can check the updated outcome later by reopening this request.  If [employer] has not responded in 1-3 days or if you have any questions about your ePA request, please contact [place] [employer] Part D at [phone]. If you think there may be a problem with your PA request, use our live chat feature at the bottom right.

## 2018-12-24 ENCOUNTER — Other Ambulatory Visit: Payer: Self-pay | Admitting: Orthopedic Surgery

## 2019-01-07 ENCOUNTER — Encounter: Payer: Self-pay | Admitting: Nurse Practitioner

## 2019-01-07 ENCOUNTER — Ambulatory Visit (INDEPENDENT_AMBULATORY_CARE_PROVIDER_SITE_OTHER): Payer: Medicare Other | Admitting: Nurse Practitioner

## 2019-01-07 ENCOUNTER — Other Ambulatory Visit: Payer: Self-pay

## 2019-01-07 VITALS — BP 126/69 | HR 55 | Temp 97.7°F | Resp 16 | Ht 64.0 in | Wt 164.0 lb

## 2019-01-07 DIAGNOSIS — I25111 Atherosclerotic heart disease of native coronary artery with angina pectoris with documented spasm: Secondary | ICD-10-CM | POA: Diagnosis not present

## 2019-01-07 DIAGNOSIS — N3000 Acute cystitis without hematuria: Secondary | ICD-10-CM | POA: Diagnosis not present

## 2019-01-07 DIAGNOSIS — R300 Dysuria: Secondary | ICD-10-CM

## 2019-01-07 LAB — MICROSCOPIC EXAMINATION: WBC, UA: >30 /hpf — AB (ref 0–5)

## 2019-01-07 LAB — URINALYSIS, COMPLETE
Nitrite, UA: POSITIVE — AB
Specific Gravity, UA: 1.015 (ref 1.005–1.030)
Urobilinogen, Ur: 4 mg/dL — ABNORMAL HIGH (ref 0.2–1.0)
pH, UA: 7 (ref 5.0–7.5)

## 2019-01-07 MED ORDER — CIPROFLOXACIN HCL 500 MG PO TABS: 500.0000 mg | ORAL_TABLET | Freq: Two times a day (BID) | ORAL | 0 refills | Status: DC

## 2019-01-07 NOTE — Patient Instructions (Signed)
Urinary Tract Infection, Adult A urinary tract infection (UTI) is an infection of any part of the urinary tract. The urinary tract includes:  The kidneys.  The ureters.  The bladder.  The urethra. These organs make, store, and get rid of pee (urine) in the body. What are the causes? This is caused by germs (bacteria) in your genital area. These germs grow and cause swelling (inflammation) of your urinary tract. What increases the risk? You are more likely to develop this condition if:  You have a small, thin tube (catheter) to drain pee.  You cannot control when you pee or poop (incontinence).  You are female, and: ? You use these methods to prevent pregnancy: ? A medicine that kills sperm (spermicide). ? A device that blocks sperm (diaphragm). ? You have low levels of a female hormone (estrogen). ? You are pregnant.  You have genes that add to your risk.  You are sexually active.  You take antibiotic medicines.  You have trouble peeing because of: ? A prostate that is bigger than normal, if you are female. ? A blockage in the part of your body that drains pee from the bladder (urethra). ? A kidney stone. ? A nerve condition that affects your bladder (neurogenic bladder). ? Not getting enough to drink. ? Not peeing often enough.  You have other conditions, such as: ? Diabetes. ? A weak disease-fighting system (immune system). ? Sickle cell disease. ? Gout. ? Injury of the spine. What are the signs or symptoms? Symptoms of this condition include:  Needing to pee right away (urgently).  Peeing often.  Peeing small amounts often.  Pain or burning when peeing.  Blood in the pee.  Pee that smells bad or not like normal.  Trouble peeing.  Pee that is cloudy.  Fluid coming from the vagina, if you are female.  Pain in the belly or lower back. Other symptoms include:  Throwing up (vomiting).  No urge to eat.  Feeling mixed up (confused).  Being tired  and grouchy (irritable).  A fever.  Watery poop (diarrhea). How is this treated? This condition may be treated with:  Antibiotic medicine.  Other medicines.  Drinking enough water. Follow these instructions at home:  Medicines  Take over-the-counter and prescription medicines only as told by your doctor.  If you were prescribed an antibiotic medicine, take it as told by your doctor. Do not stop taking it even if you start to feel better. General instructions  Make sure you: ? Pee until your bladder is empty. ? Do not hold pee for a long time. ? Empty your bladder after sex. ? Wipe from front to back after pooping if you are a female. Use each tissue one time when you wipe.  Drink enough fluid to keep your pee pale yellow.  Keep all follow-up visits as told by your doctor. This is important. Contact a doctor if:  You do not get better after 1-2 days.  Your symptoms go away and then come back. Get help right away if:  You have very bad back pain.  You have very bad pain in your lower belly.  You have a fever.  You are sick to your stomach (nauseous).  You are throwing up. Summary  A urinary tract infection (UTI) is an infection of any part of the urinary tract.  This condition is caused by germs in your genital area.  There are many risk factors for a UTI. These include having a small, thin   tube to drain pee and not being able to control when you pee or poop.  Treatment includes antibiotic medicines for germs.  Drink enough fluid to keep your pee pale yellow. This information is not intended to replace advice given to you by your health care provider. Make sure you discuss any questions you have with your health care provider. Document Released: 09/25/2007 Document Revised: 03/26/2018 Document Reviewed: 10/16/2017 Elsevier Patient Education  2020 Elsevier Inc.  

## 2019-01-07 NOTE — Progress Notes (Signed)
Subjective:    Patient ID: Teresa Holloway, female    DOB: [birth year], 78 y.o.   MRN: [id number]   Chief Complaint: Dysuria   HPI Patient comes in today c/o dysuria that started yesterday. Had to get up last night 2x to go to the restroom. Has taken some AZO OTC for symptoms which has helped some.   Review of Systems  Constitutional: Negative for activity change and appetite change.  HENT: Negative.   Eyes: Negative for pain.  Respiratory: Negative for shortness of breath.   Cardiovascular: Negative for chest pain, palpitations and leg swelling.  Gastrointestinal: Negative for abdominal pain.  Endocrine: Negative for polydipsia.  Genitourinary: Positive for dysuria, frequency and urgency.  Skin: Negative for rash.  Neurological: Negative for dizziness, weakness and headaches.  Hematological: Does not bruise/bleed easily.  Psychiatric/Behavioral: Negative.   All other systems reviewed and are negative.      Objective:   Physical Exam Vitals signs and nursing note reviewed.  Constitutional:      Appearance: Normal appearance.  Cardiovascular:     Rate and Rhythm: Normal rate and regular rhythm.     Heart sounds: Normal heart sounds.  Pulmonary:     Breath sounds: Normal breath sounds.  Abdominal:     General: Bowel sounds are normal.     Palpations: Abdomen is soft.     Tenderness: There is no right CVA tenderness or left CVA tenderness.  Skin:    General: Skin is warm and dry.  Neurological:     General: No focal deficit present.     Mental Status: She is alert and oriented to person, place, and time.  Psychiatric:        Mood and Affect: Mood normal.        Behavior: Behavior normal.    BP 126/69   Pulse (!) 55   Temp 97.7 F (36.5 C) (Oral)   Resp 16   Ht 5\' 4"  (1.626 m)   Wt 164 lb (74.4 kg)   SpO2 96%   BMI 28.15 kg/m         Assessment & Plan:  Teresa Holloway in today with chief complaint of Dysuria   1. Dysuria  - Urinalysis,  Complete  2. Acute cystitis without hematuria Take medication as prescribe Cotton underwear Take shower not bath Cranberry juice, yogurt Force fluids AZO over the counter X2 days Culture pending RTO prn  - ciprofloxacin (CIPRO) 500 MG tablet; Take 1 tablet (500 mg total) by mouth 2 (two) times daily.  Dispense: 14 tablet; Refill: 0 - Urine Culture  Mary-Margaret Daphine Deutscher, FNP

## 2019-01-08 LAB — URINE CULTURE

## 2019-01-11 ENCOUNTER — Other Ambulatory Visit: Payer: Self-pay | Admitting: Nurse Practitioner

## 2019-01-11 DIAGNOSIS — M810 Age-related osteoporosis without current pathological fracture: Secondary | ICD-10-CM

## 2019-01-15 NOTE — Patient Instructions (Addendum)
DUE TO COVID-19 ONLY ONE VISITOR IS ALLOWED TO COME WITH YOU AND STAY IN THE WAITING ROOM ONLY DURING PRE OP AND PROCEDURE DAY OF SURGERY.  THE 1 VISITOR MAY VISIT WITH YOU AFTER SURGERY IN YOUR PRIVATE ROOM DURING VISITING HOURS ONLY!  YOU NEED TO HAVE A COVID 19 TEST ON__10/1_____ @__3 :00_____, THIS TEST MUST BE DONE BEFORE SURGERY, COME  [address], [place]  , [address].  ([place])  ONCE YOUR COVID TEST IS COMPLETED, PLEASE BEGIN THE QUARANTINE INSTRUCTIONS AS OUTLINED IN YOUR HANDOUT.                Teresa Holloway    Your procedure is scheduled on: Monday 01/25/19   Report to [facility] Main  Entrance Report to admitting at  7:45 AM     Call this number if you have problems the morning of surgery [phone]   . BRUSH YOUR TEETH MORNING OF SURGERY AND RINSE YOUR MOUTH OUT, NO CHEWING GUM CANDY OR MINTS.  Do not eat food After Midnight.   YOU MAY HAVE CLEAR LIQUIDS FROM MIDNIGHT UNTIL 7:00 AM  . At 7:00 AM Please finish the prescribed Pre-Surgery  drink.   Nothing by mouth after you finish the  drink !    Take these medicines the morning of surgery with A SIP OF WATER: none                                 You may not have any metal on your body including hair pins and              piercings             Do not wear jewelry, make-up, lotions, powders or perfumes, deodorant             Do not wear nail polish on your fingernails.  Do not shave  48 hours prior to surgery.              Do not bring valuables to the hospital. [place].  Contacts, dentures or bridgework may not be worn into surgery. .      Name and phone number of your driver:  Special Instructions: N/A              Please read over the following fact sheets you were given: _____________________________________________________________________   [facility] - Preparing for Surgery  Before surgery, you can play an  important role.   Because skin is not sterile, your skin needs to be as free of germs as possible.   You can reduce the number of germs on your skin by washing with CHG (chlorahexidine gluconate) soap before surgery.   CHG is an antiseptic cleaner which kills germs and bonds with the skin to continue killing germs even after washing. Please DO NOT use if you have an allergy to CHG or antibacterial soaps.   If your skin becomes reddened/irritated stop using the CHG and inform your nurse when you arrive at Short Stay. Do not shave (including legs and underarms) for at least 48 hours prior to the first CHG shower.   Please follow these instructions carefully:  1.  Shower with CHG Soap the night before surgery and the  morning of Surgery.  2.  If you choose  to wash your hair, wash your hair first as usual with your  normal  shampoo.  3.  After you shampoo, rinse your hair and body thoroughly to remove the  shampoo.                                        4.  Use CHG as you would any other liquid soap.  You can apply chg directly  to the skin and wash                       Gently with a scrungie or clean washcloth.  5.  Apply the CHG Soap to your body ONLY FROM THE NECK DOWN.   Do not use on face/ open                           Wound or open sores. Avoid contact with eyes, ears mouth and genitals (private parts).                       Wash face,  Genitals (private parts) with your normal soap.             6.  Wash thoroughly, paying special attention to the area where your surgery  will be performed.  7.  Thoroughly rinse your body with warm water from the neck down.  8.  DO NOT shower/wash with your normal soap after using and rinsing off  the CHG Soap.                9.  Pat yourself dry with a clean towel.            10.  Wear clean pajamas.            11.  Place clean sheets on your bed the night of your first shower and do not  sleep with pets. Day of Surgery : Do not apply any  lotions/deodorants the morning of surgery.  Please wear clean clothes to the hospital/surgery center.  FAILURE TO FOLLOW THESE INSTRUCTIONS MAY RESULT IN THE CANCELLATION OF YOUR SURGERY PATIENT SIGNATURE_________________________________  NURSE SIGNATURE__________________________________  ________________________________________________________________________   Teresa Holloway  An incentive spirometer is a tool that can help keep your lungs clear and active. This tool measures how well you are filling your lungs with each breath. Taking long deep breaths may help reverse or decrease the chance of developing breathing (pulmonary) problems (especially infection) following:  A long period of time when you are unable to move or be active. BEFORE THE PROCEDURE   If the spirometer includes an indicator to show your best effort, your nurse or respiratory therapist will set it to a desired goal.  If possible, sit up straight or lean slightly forward. Try not to slouch.  Hold the incentive spirometer in an upright position. INSTRUCTIONS FOR USE  1. Sit on the edge of your bed if possible, or sit up as far as you can in bed or on a chair. 2. Hold the incentive spirometer in an upright position. 3. Breathe out normally. 4. Place the mouthpiece in your mouth and seal your lips tightly around it. 5. Breathe in slowly and as deeply as possible, raising the piston or the ball toward the top of the column. 6. Hold your breath for 3-5 seconds or for  as long as possible. Allow the piston or ball to fall to the bottom of the column. 7. Remove the mouthpiece from your mouth and breathe out normally. 8. Rest for a few seconds and repeat Steps 1 through 7 at least 10 times every 1-2 hours when you are awake. Take your time and take a few normal breaths between deep breaths. 9. The spirometer may include an indicator to show your best effort. Use the indicator as a goal to work toward during each  repetition. 10. After each set of 10 deep breaths, practice coughing to be sure your lungs are clear. If you have an incision (the cut made at the time of surgery), support your incision when coughing by placing a pillow or rolled up towels firmly against it. Once you are able to get out of bed, walk around indoors and cough well. You may stop using the incentive spirometer when instructed by your caregiver.  RISKS AND COMPLICATIONS  Take your time so you do not get dizzy or light-headed.  If you are in pain, you may need to take or ask for pain medication before doing incentive spirometry. It is harder to take a deep breath if you are having pain. AFTER USE  Rest and breathe slowly and easily.  It can be helpful to keep track of a log of your progress. Your caregiver can provide you with a simple table to help with this. If you are using the spirometer at home, follow these instructions: [place] IF:   You are having difficultly using the spirometer.  You have trouble using the spirometer as often as instructed.  Your pain medication is not giving enough relief while using the spirometer.  You develop fever of 100.5 F (38.1 C) or higher. SEEK IMMEDIATE MEDICAL CARE IF:   You cough up bloody sputum that had not been present before.  You develop fever of 102 F (38.9 C) or greater.  You develop worsening pain at or near the incision site. MAKE SURE YOU:   Understand these instructions.  Will watch your condition.  Will get help right away if you are not doing well or get worse. Document Released: 08/19/2006 Document Revised: 07/01/2011 Document Reviewed: 10/20/2006 Exit[employer] Patient Information 2014 Exit[employer], [place].   ________________________________________________________________________  WHAT IS A BLOOD TRANSFUSION? Blood Transfusion Information  A transfusion is the replacement of blood or some of its parts. Blood is made up of multiple cells which provide  different functions.  Red blood cells carry oxygen and are used for blood loss replacement.  White blood cells fight against infection.  Platelets control bleeding.  Plasma helps clot blood.  Other blood products are available for specialized needs, such as hemophilia or other clotting disorders. BEFORE THE TRANSFUSION  Who gives blood for transfusions?   Healthy volunteers who are fully evaluated to make sure their blood is safe. This is blood bank blood. Transfusion therapy is the safest it has ever been in the practice of medicine. Before blood is taken from a donor, a complete history is taken to make sure that person has no history of diseases nor engages in risky social behavior (examples are intravenous drug use or sexual activity with multiple partners). The donor's travel history is screened to minimize risk of transmitting infections, such as malaria. The donated blood is tested for signs of infectious diseases, such as HIV and hepatitis. The blood is then tested to be sure it is compatible with you in order to minimize the  chance of a transfusion reaction. If you or a relative donates blood, this is often done in anticipation of surgery and is not appropriate for emergency situations. It takes many days to process the donated blood. RISKS AND COMPLICATIONS Although transfusion therapy is very safe and saves many lives, the main dangers of transfusion include:   Getting an infectious disease.  Developing a transfusion reaction. This is an allergic reaction to something in the blood you were given. Every precaution is taken to prevent this. The decision to have a blood transfusion has been considered carefully by your caregiver before blood is given. Blood is not given unless the benefits outweigh the risks. AFTER THE TRANSFUSION  Right after receiving a blood transfusion, you will usually feel much better and more energetic. This is especially true if your red blood cells have  gotten low (anemic). The transfusion raises the level of the red blood cells which carry oxygen, and this usually causes an energy increase.  The nurse administering the transfusion will monitor you carefully for complications. HOME CARE INSTRUCTIONS  No special instructions are needed after a transfusion. You may find your energy is better. Speak with your caregiver about any limitations on activity for underlying diseases you may have. SEEK MEDICAL CARE IF:   Your condition is not improving after your transfusion.  You develop redness or irritation at the intravenous (IV) site. SEEK IMMEDIATE MEDICAL CARE IF:  Any of the following symptoms occur over the next 12 hours:  Shaking chills.  You have a temperature by mouth above 102 F (38.9 C), not controlled by medicine.  Chest, back, or muscle pain.  People around you feel you are not acting correctly or are confused.  Shortness of breath or difficulty breathing.  Dizziness and fainting.  You get a rash or develop hives.  You have a decrease in urine output.  Your urine turns a dark color or changes to pink, red, or brown. Any of the following symptoms occur over the next 10 days:  You have a temperature by mouth above 102 F (38.9 C), not controlled by medicine.  Shortness of breath.  Weakness after normal activity.  The white part of the eye turns yellow (jaundice).  You have a decrease in the amount of urine or are urinating less often.  Your urine turns a dark color or changes to pink, red, or brown. Document Released: 04/05/2000 Document Revised: 07/01/2011 Document Reviewed: 11/23/2007 [facility] Patient Information 2014 [place], [place].  _______________________________________________________________________

## 2019-01-18 ENCOUNTER — Ambulatory Visit (HOSPITAL_COMMUNITY)
Admission: RE | Admit: 2019-01-18 | Discharge: 2019-01-18 | Disposition: A | Discharge status: Home / Self Care | Payer: Medicare Other | Source: Ambulatory Visit | Attending: Orthopedic Surgery | Admitting: Orthopedic Surgery

## 2019-01-18 ENCOUNTER — Other Ambulatory Visit: Payer: Self-pay

## 2019-01-18 ENCOUNTER — Encounter (HOSPITAL_COMMUNITY)
Admission: RE | Admit: 2019-01-18 | Discharge: 2019-01-18 | Disposition: A | Discharge status: Home / Self Care | Payer: Medicare Other | Source: Ambulatory Visit | Attending: Orthopedic Surgery | Admitting: Orthopedic Surgery

## 2019-01-18 ENCOUNTER — Encounter (HOSPITAL_COMMUNITY): Payer: Self-pay

## 2019-01-18 DIAGNOSIS — Z01818 Encounter for other preprocedural examination: Secondary | ICD-10-CM

## 2019-01-18 DIAGNOSIS — M1712 Unilateral primary osteoarthritis, left knee: Secondary | ICD-10-CM | POA: Diagnosis not present

## 2019-01-18 DIAGNOSIS — I10 Essential (primary) hypertension: Secondary | ICD-10-CM | POA: Insufficient documentation

## 2019-01-18 DIAGNOSIS — E785 Hyperlipidemia, unspecified: Secondary | ICD-10-CM | POA: Insufficient documentation

## 2019-01-18 DIAGNOSIS — Z7982 Long term (current) use of aspirin: Secondary | ICD-10-CM | POA: Insufficient documentation

## 2019-01-18 DIAGNOSIS — I252 Old myocardial infarction: Secondary | ICD-10-CM | POA: Insufficient documentation

## 2019-01-18 DIAGNOSIS — I2510 Atherosclerotic heart disease of native coronary artery without angina pectoris: Secondary | ICD-10-CM | POA: Diagnosis not present

## 2019-01-18 DIAGNOSIS — Z79899 Other long term (current) drug therapy: Secondary | ICD-10-CM | POA: Insufficient documentation

## 2019-01-18 HISTORY — DX: Depression, unspecified: F32.A

## 2019-01-18 HISTORY — DX: Anxiety disorder, unspecified: F41.9

## 2019-01-18 LAB — CBC WITH DIFFERENTIAL/PLATELET
Abs Immature Granulocytes: 0.01 10*3/uL (ref 0.00–0.07)
Basophils Absolute: 0.1 10*3/uL (ref 0.0–0.1)
Basophils Relative: 1 %
Eosinophils Absolute: 0.3 10*3/uL (ref 0.0–0.5)
Eosinophils Relative: 4 %
HCT: 37.5 % (ref 36.0–46.0)
Hemoglobin: 11.8 g/dL — ABNORMAL LOW (ref 12.0–15.0)
Immature Granulocytes: 0 %
Lymphocytes Relative: 27 %
Lymphs Abs: 2 10*3/uL (ref 0.7–4.0)
MCH: 30.3 pg (ref 26.0–34.0)
MCHC: 31.5 g/dL (ref 30.0–36.0)
MCV: 96.2 fL (ref 80.0–100.0)
Monocytes Absolute: 0.8 10*3/uL (ref 0.1–1.0)
Monocytes Relative: 10 %
Neutro Abs: 4.4 10*3/uL (ref 1.7–7.7)
Neutrophils Relative %: 58 %
Platelets: 335 10*3/uL (ref 150–400)
RBC: 3.9 MIL/uL (ref 3.87–5.11)
RDW: 13 % (ref 11.5–15.5)
WBC: 7.5 10*3/uL (ref 4.0–10.5)
nRBC: 0 % (ref 0.0–0.2)

## 2019-01-18 LAB — URINALYSIS, ROUTINE W REFLEX MICROSCOPIC
Bacteria, UA: NONE SEEN
Bilirubin Urine: NEGATIVE
Glucose, UA: NEGATIVE mg/dL
Ketones, ur: NEGATIVE mg/dL
Leukocytes,Ua: NEGATIVE
Nitrite: NEGATIVE
Protein, ur: NEGATIVE mg/dL
Specific Gravity, Urine: 1.018 (ref 1.005–1.030)
pH: 6 (ref 5.0–8.0)

## 2019-01-18 LAB — BASIC METABOLIC PANEL
Anion gap: 13 (ref 5–15)
BUN: 22 mg/dL (ref 8–23)
CO2: 28 mmol/L (ref 22–32)
Calcium: 9.9 mg/dL (ref 8.9–10.3)
Chloride: 98 mmol/L (ref 98–111)
Creatinine, Ser: 1.09 mg/dL — ABNORMAL HIGH (ref 0.44–1.00)
GFR calc Af Amer: 57 mL/min — ABNORMAL LOW (ref >60)
GFR calc non Af Amer: 49 mL/min — ABNORMAL LOW (ref >60)
Glucose, Bld: 105 mg/dL — ABNORMAL HIGH (ref 70–99)
Potassium: 3.2 mmol/L — ABNORMAL LOW (ref 3.5–5.1)
Sodium: 139 mmol/L (ref 135–145)

## 2019-01-18 LAB — SURGICAL PCR SCREEN
MRSA, PCR: NEGATIVE
Staphylococcus aureus: NEGATIVE

## 2019-01-18 LAB — PROTIME-INR
INR: 1 (ref 0.8–1.2)
Prothrombin Time: 12.9 seconds (ref 11.4–15.2)

## 2019-01-18 LAB — APTT: aPTT: 30 seconds (ref 24–36)

## 2019-01-18 NOTE — Progress Notes (Signed)
PCP - Dr. Ronnald Collum Cardiologist - Adele Barthel  Chest x-ray - 01/18/19 EKG - 01/18/19 Stress Test - 11/23/18 ECHO - 11/23/18 Cardiac Cath - no  Sleep Study - no CPAP - no  Fasting Blood Sugar - NA Checks Blood Sugar _____ times a day  Blood Thinner Instructions: Aspirin Instructions:Did not receive any instructions. Pt was told to call Dr. Damita Dunnings office  Last Dose:   Anesthesia review:   Patient denies shortness of breath, fever, cough and chest pain at PAT appointment  yes Patient verbalized understanding of instructions that were given to them at the PAT appointment. Patient was also instructed that they will need to review over the PAT instructions again at home before surgery. yes

## 2019-01-19 LAB — ABO/RH: ABO/RH(D): O POS

## 2019-01-19 NOTE — Anesthesia Preprocedure Evaluation (Addendum)
Anesthesia Evaluation  Patient identified by MRN, date of birth, ID band Patient awake    Reviewed: Allergy & Precautions, NPO status , Patient's Chart, lab work & pertinent test results  Airway Mallampati: II  TM Distance: >3 FB Neck ROM: Full    Dental no notable dental hx.    Pulmonary neg pulmonary ROS,    Pulmonary exam normal breath sounds clear to auscultation       Cardiovascular hypertension, Pt. on medications and Pt. on home beta blockers + Past MI  Normal cardiovascular exam Rhythm:Regular Rate:Normal     Neuro/Psych negative neurological ROS  negative psych ROS   GI/Hepatic negative GI ROS, Neg liver ROS,   Endo/Other  negative endocrine ROS  Renal/GU negative Renal ROS  negative genitourinary   Musculoskeletal negative musculoskeletal ROS (+)   Abdominal   Peds negative pediatric ROS (+)  Hematology negative hematology ROS (+)   Anesthesia Other Findings   Reproductive/Obstetrics negative OB ROS                            Anesthesia Physical Anesthesia Plan  ASA: III  Anesthesia Plan: Spinal   Post-op Pain Management:  Regional for Post-op pain   Induction: Intravenous  PONV Risk Score and Plan: 2 and Ondansetron, Dexamethasone and Treatment may vary due to age or medical condition  Airway Management Planned: Simple Face Mask  Additional Equipment:   Intra-op Plan:   Post-operative Plan:   Informed Consent: I have reviewed the patients History and Physical, chart, labs and discussed the procedure including the risks, benefits and alternatives for the proposed anesthesia with the patient or authorized representative who has indicated his/her understanding and acceptance.     Dental advisory given  Plan Discussed with: CRNA and Surgeon  Anesthesia Plan Comments: (See PAT note 01/18/2019, Konrad Felix, PA-C)       Anesthesia Quick Evaluation

## 2019-01-19 NOTE — Progress Notes (Signed)
Anesthesia Chart Review   Case: [id number] Date/Time: 01/25/19 1000   Procedure: LEFT TOTAL KNEE ARTHROPLASTY (Left Knee)   Anesthesia type: Spinal   Pre-op diagnosis: LEFT KNEE OSTEOARTHRITIS   Location: [facility]   Surgeon: Frederik Pear, MD      DISCUSSION:78 y.o. never smoker with h/o CAD, MI, HTN, HLD, GERD, left knee OA scheduled for above procedure 01/25/2019 with Dr. Frederik Pear.   Pt cleared by cardiologist, Dr. Clovia Cuff, 12/17/2018.  Per telephone note, "Her stress test was negative.  She can have surgery at low cardiovascular risk."  Anticipate pt can proceed with planned procedure barring acute status change.   VS: BP 116/60   Pulse (!) 55   Temp 36.8 C (Oral)   Resp 18   Ht 5\' 8"  (1.727 m)   Wt 73.1 kg   SpO2 98%   BMI 24.49 kg/m   PROVIDERS: Chevis Pretty, FNP is PCP   Curly Rim, MD is Cardiologist last seen 09/17/2017 LABS: Labs reviewed: Acceptable for surgery. (all labs ordered are listed, but only abnormal results are displayed)  Labs Reviewed  BASIC METABOLIC PANEL - Abnormal; Notable for the following components:      Result Value   Potassium 3.2 (*)    Glucose, Bld 105 (*)    Creatinine, Ser 1.09 (*)    GFR calc non Af Amer 49 (*)    GFR calc Af Amer 57 (*)    All other components within normal limits  CBC WITH DIFFERENTIAL/PLATELET - Abnormal; Notable for the following components:   Hemoglobin 11.8 (*)    All other components within normal limits  URINALYSIS, ROUTINE W REFLEX MICROSCOPIC - Abnormal; Notable for the following components:   Hgb urine dipstick SMALL (*)    All other components within normal limits  SURGICAL PCR SCREEN  APTT  PROTIME-INR  TYPE AND SCREEN  ABO/RH     IMAGES: Chest Xray 01/18/2019 FINDINGS: Stable cardiomediastinal contours with normal heart size. The lungs are clear. No pneumothorax or pleural effusion. No acute findings in the visualized skeleton.  IMPRESSION: No acute  cardiopulmonary process  EKG: 01/18/2019 Rate 53 bpm Sinus bradycardia Minimal voltage criteria for LVH, may be normal variant Nonspecific T wave abnormality Abnormal ECG No previous ECGs available  CV: Stress Echo 11/23/2018 SUMMARY The patient had no chest pain during stress The patient achieved 91 % of maximum predicted heart rate. Normal left ventricular function and global wall motion with stress. Global LV function is preserved with stress. Negative dobutamine echocardiography for inducible ischemia at target heart  rate. Negative stress ECG for inducible ischemia at target heart rate. REST ECHO Normal left ventricular function at rest. There was normal left ventricular  wall motion at rest. There were no segmental wall motion abnormalities at  rest. The estimated LV ejection fraction is 55-60% . Past Medical History:  Diagnosis Date  . Anxiety   . Arthritis    Knees, hips  . CAD (coronary artery disease)   . Chronic back pain   . Depression   . GERD (gastroesophageal reflux disease)   . Hyperlipidemia   . Hypertension   . MI (myocardial infarction) ([place])   . Osteopenia     Past Surgical History:  Procedure Laterality Date  . ECTOPIC PREGNANCY SURGERY    . left breast biospy    . left wrist fracture      MEDICATIONS: . alendronate (FOSAMAX) 70 MG tablet  . amLODipine (NORVASC) 10 MG tablet  .  aspirin 81 MG tablet  . atorvastatin (LIPITOR) 40 MG tablet  . benazepril (LOTENSIN) 40 MG tablet  . cholecalciferol (VITAMIN D) 1000 UNITS tablet  . ciprofloxacin (CIPRO) 500 MG tablet  . clopidogrel (PLAVIX) 75 MG tablet  . cyclobenzaprine (FLEXERIL) 5 MG tablet  . diclofenac sodium (VOLTAREN) 1 % GEL  . hydrochlorothiazide (HYDRODIURIL) 25 MG tablet  . HYDROcodone-acetaminophen (NORCO) 5-325 MG tablet  . isosorbide mononitrate (IMDUR) 60 MG 24 hr tablet  . latanoprost (XALATAN) 0.005 % ophthalmic solution  . metoprolol succinate (TOPROL-XL) 100 MG 24 hr tablet   . nitroGLYCERIN (NITROSTAT) 0.4 MG SL tablet  . PARoxetine (PAXIL) 40 MG tablet   No current facility-administered medications for this encounter.     Maia Plan [facility] Pre-Surgical Testing [phone] 01/19/19  4:05 PM

## 2019-01-21 ENCOUNTER — Other Ambulatory Visit (HOSPITAL_COMMUNITY)
Admission: RE | Admit: 2019-01-21 | Discharge: 2019-01-21 | Disposition: A | Discharge status: Home / Self Care | Payer: Medicare Other | Source: Ambulatory Visit | Attending: Orthopedic Surgery | Admitting: Orthopedic Surgery

## 2019-01-21 DIAGNOSIS — Z20828 Contact with and (suspected) exposure to other viral communicable diseases: Secondary | ICD-10-CM | POA: Insufficient documentation

## 2019-01-22 DIAGNOSIS — M1712 Unilateral primary osteoarthritis, left knee: Secondary | ICD-10-CM | POA: Diagnosis present

## 2019-01-22 LAB — NOVEL CORONAVIRUS, NAA (HOSP ORDER, SEND-OUT TO REF LAB; TAT 18-24 HRS): SARS-CoV-2, NAA: NOT DETECTED

## 2019-01-22 NOTE — H&P (Signed)
TOTAL KNEE ADMISSION H&P  Patient is being admitted for left total knee arthroplasty.  Subjective:  Chief Complaint:left knee pain.  HPI: Teresa Holloway, 78 y.o. female, has a history of pain and functional disability in the left knee due to arthritis and has failed non-surgical conservative treatments for greater than 12 weeks to includeNSAID's and/or analgesics, corticosteriod injections, viscosupplementation injections, use of assistive devices and activity modification.  Onset of symptoms was gradual, starting 3 years ago with gradually worsening course since that time. The patient noted no past surgery on the left knee(s).  Patient currently rates pain in the left knee(s) at 10 out of 10 with activity. Patient has night pain, worsening of pain with activity and weight bearing, pain that interferes with activities of daily living, pain with passive range of motion, crepitus and joint swelling.  Patient has evidence of joint space narrowing by imaging studies.  There is no active infection.  Patient Active Problem List   Diagnosis Date Noted  . Pain management contract agreement 02/22/2016  . Opioid type dependence, continuous ([place]) 02/22/2016  . BMI 30.0-30.9,adult 01/02/2015  . Osteoarthritis of left hip 08/23/2013  . Depression 08/23/2013  . Osteoporosis 06/23/2013  . Glaucoma of right eye 06/23/2013  . CAD (coronary artery disease) 08/19/2012  . History of MI (myocardial infarction) 08/19/2012  . Arthritis of both knees 08/19/2012  . Essential hypertension, benign 08/10/2012  . Hyperlipidemia with target LDL less than 100 08/10/2012  . GERD (gastroesophageal reflux disease) 08/10/2012   Past Medical History:  Diagnosis Date  . Anxiety   . Arthritis    Knees, hips  . CAD (coronary artery disease)   . Chronic back pain   . Depression   . GERD (gastroesophageal reflux disease)   . Hyperlipidemia   . Hypertension   . MI (myocardial infarction) ([place])   . Osteopenia      Past Surgical History:  Procedure Laterality Date  . ECTOPIC PREGNANCY SURGERY    . left breast biospy    . left wrist fracture      No current facility-administered medications for this encounter.    Current Outpatient Medications  Medication Sig Dispense Refill Last Dose  . amLODipine (NORVASC) 10 MG tablet Take 1 tablet (10 mg total) by mouth daily. (Patient taking differently: Take 10 mg by mouth at bedtime. ) 90 tablet 1   . aspirin 81 MG tablet Take 81 mg by mouth at bedtime.      Marland Kitchen atorvastatin (LIPITOR) 40 MG tablet Take 1 tablet (40 mg total) by mouth daily at 6 PM. 90 tablet 1   . benazepril (LOTENSIN) 40 MG tablet Take 1 tablet (40 mg total) by mouth daily. (Patient taking differently: Take 40 mg by mouth at bedtime. ) 90 tablet 1   . cholecalciferol (VITAMIN D) 1000 UNITS tablet Take 1,000 Units by mouth daily.     . clopidogrel (PLAVIX) 75 MG tablet Take 1 tablet (75 mg total) by mouth daily. (Patient taking differently: Take 75 mg by mouth at bedtime. ) 90 tablet 1   . cyclobenzaprine (FLEXERIL) 5 MG tablet Take 1 tablet (5 mg total) by mouth 3 (three) times daily. PLEASE SPECIFY DIRECTIONS, REFILLS AND QUANTITY (Patient taking differently: Take 5 mg by mouth 3 (three) times daily as needed for muscle spasms. ) 30 tablet 0   . diclofenac sodium (VOLTAREN) 1 % GEL Apply 2 g topically 4 (four) times daily. (Patient taking differently: Apply 2 g topically 4 (four) times daily as  needed (pain). ) 500 g 1   . hydrochlorothiazide (HYDRODIURIL) 25 MG tablet Take 1 tablet (25 mg total) by mouth daily. (Patient taking differently: Take 25 mg by mouth at bedtime. ) 90 tablet 1   . HYDROcodone-acetaminophen (NORCO) 5-325 MG tablet Take 1 tablet by mouth every 6 (six) hours as needed for moderate pain. 60 tablet 0   . isosorbide mononitrate (IMDUR) 60 MG 24 hr tablet Take 1 tablet (60 mg total) by mouth daily. (Patient taking differently: Take 60 mg by mouth at bedtime. ) 90 tablet 1   .  latanoprost (XALATAN) 0.005 % ophthalmic solution Place 1 drop into both eyes at bedtime.     . metoprolol succinate (TOPROL-XL) 100 MG 24 hr tablet TAKE 1 TABLET (100 MG TOTAL) BY MOUTH DAILY. TAKE 1/2 TABLET DAILY (Patient taking differently: Take 50 mg by mouth at bedtime. ) 90 tablet 1   . nitroGLYCERIN (NITROSTAT) 0.4 MG SL tablet Place 1 tablet (0.4 mg total) under the tongue every 5 (five) minutes as needed for chest pain. 30 tablet 1   . PARoxetine (PAXIL) 40 MG tablet Take 1 tablet (40 mg total) by mouth daily. (Patient taking differently: Take 40 mg by mouth at bedtime. ) 90 tablet 1   . alendronate (FOSAMAX) 70 MG tablet TAKE 1 TABLE BY MOUTH EVERY 7 DAYS. TAKE WITH A FULL GLASS OF WATER ON AN EMPTY STOMACH. (Patient not taking: Reported on 01/14/2019) 12 tablet 0 Not Taking at Unknown time  . ciprofloxacin (CIPRO) 500 MG tablet Take 1 tablet (500 mg total) by mouth 2 (two) times daily. (Patient not taking: Reported on 01/14/2019) 14 tablet 0 Completed Course at Unknown time   Allergies  Allergen Reactions  . Atarax [Hydroxyzine] Nausea And Vomiting  . Bactrim [Sulfamethoxazole-Trimethoprim] Nausea Only  . Coreg [Carvedilol] Other (See Comments)    Unknown    Social History   Tobacco Use  . Smoking status: Never Smoker  . Smokeless tobacco: Never Used  Substance Use Topics  . Alcohol use: No    Family History  Problem Relation Age of Onset  . Asthma Father   . Diabetes Sister   . Diabetes Brother      Review of Systems  Constitutional: Negative.   HENT: Positive for nosebleeds.   Eyes: Negative.   Respiratory: Negative.   Cardiovascular: Negative.   Gastrointestinal: Negative.   Genitourinary:       Poor bladder control  Musculoskeletal: Positive for joint pain.  Skin: Negative.   Neurological:       Poor balance  Endo/Heme/Allergies: Negative.   Psychiatric/Behavioral: Negative.     Objective:  Physical Exam  Constitutional: She is oriented to person, place,  and time. She appears well-developed and well-nourished.  HENT:  Head: Normocephalic and atraumatic.  Eyes: Pupils are equal, round, and reactive to light.  Neck: Normal range of motion. Neck supple.  Cardiovascular: Intact distal pulses.  Respiratory: Effort normal.  Musculoskeletal:        General: Tenderness and deformity present.     Comments: The left knee has obvious valgus deformity range of motion is 0-125 today 1+ laxity to valgus stress tender along the lateral joint line trace effusion.  Neurovascular intact distally.    Neurological: She is alert and oriented to person, place, and time.  Skin: Skin is warm and dry.  Psychiatric: She has a normal mood and affect. Her behavior is normal. Judgment and thought content normal.    Vital signs in last 24  hours:    Labs:   Estimated body mass index is 24.49 kg/m as calculated from the following:   Height as of 01/18/19: 5\' 8"  (1.727 m).   Weight as of 01/18/19: 73.1 kg.   Imaging Review Plain radiographs demonstrate  bone-on-bone arthritic changes to the left knee lateral compartment with valgus deformity   Assessment/Plan:  End stage arthritis, left knee   The patient history, physical examination, clinical judgment of the provider and imaging studies are consistent with end stage degenerative joint disease of the left knee(s) and total knee arthroplasty is deemed medically necessary. The treatment options including medical management, injection therapy arthroscopy and arthroplasty were discussed at length. The risks and benefits of total knee arthroplasty were presented and reviewed. The risks due to aseptic loosening, infection, stiffness, patella tracking problems, thromboembolic complications and other imponderables were discussed. The patient acknowledged the explanation, agreed to proceed with the plan and consent was signed. Patient is being admitted for inpatient treatment for surgery, pain control, PT, OT,  prophylactic antibiotics, VTE prophylaxis, progressive ambulation and ADL's and discharge planning. The patient is planning to be discharged home with home health services     Patient's anticipated LOS is less than 2 midnights, meeting these requirements: - Younger than 75 - Lives within 1 hour of care - Has a competent adult at home to recover with post-op recover - NO history of  - Chronic pain requiring opiods  - Diabetes    - Heart failure  - Heart attack  - Stroke  - DVT/VTE  - Cardiac arrhythmia  - Respiratory Failure/COPD  - Renal failure  - Anemia  - Advanced Liver disease

## 2019-01-24 MED ORDER — BUPIVACAINE LIPOSOME 1.3 % IJ SUSP
20.0000 mL | INTRAMUSCULAR | Status: DC
  Filled 2019-01-24: qty 20

## 2019-01-24 MED ORDER — TRANEXAMIC ACID 1000 MG/10ML IV SOLN
2000.0000 mg | INTRAVENOUS | Status: DC
  Filled 2019-01-24: qty 20

## 2019-01-25 ENCOUNTER — Encounter (HOSPITAL_COMMUNITY)
Admission: RE | Disposition: A | Discharge status: Home Health | Payer: Self-pay | Source: Home / Self Care | Attending: Orthopedic Surgery

## 2019-01-25 ENCOUNTER — Ambulatory Visit (HOSPITAL_COMMUNITY): Payer: Medicare Other | Admitting: Physician Assistant

## 2019-01-25 ENCOUNTER — Encounter (HOSPITAL_COMMUNITY): Payer: Self-pay | Admitting: Certified Registered Nurse Anesthetist

## 2019-01-25 ENCOUNTER — Ambulatory Visit (HOSPITAL_COMMUNITY): Payer: Medicare Other | Admitting: Anesthesiology

## 2019-01-25 ENCOUNTER — Inpatient Hospital Stay (HOSPITAL_COMMUNITY)
Admission: RE | Admit: 2019-01-25 | Discharge: 2019-01-28 | DRG: 470 | Disposition: A | Discharge status: Home Health | Payer: Medicare Other | Attending: Orthopedic Surgery | Admitting: Orthopedic Surgery

## 2019-01-25 ENCOUNTER — Other Ambulatory Visit: Payer: Self-pay

## 2019-01-25 DIAGNOSIS — I10 Essential (primary) hypertension: Secondary | ICD-10-CM | POA: Diagnosis not present

## 2019-01-25 DIAGNOSIS — G8918 Other acute postprocedural pain: Secondary | ICD-10-CM | POA: Diagnosis not present

## 2019-01-25 DIAGNOSIS — Z79899 Other long term (current) drug therapy: Secondary | ICD-10-CM

## 2019-01-25 DIAGNOSIS — S161XXA Strain of muscle, fascia and tendon at neck level, initial encounter: Secondary | ICD-10-CM

## 2019-01-25 DIAGNOSIS — Z96652 Presence of left artificial knee joint: Secondary | ICD-10-CM

## 2019-01-25 DIAGNOSIS — M810 Age-related osteoporosis without current pathological fracture: Secondary | ICD-10-CM | POA: Diagnosis not present

## 2019-01-25 DIAGNOSIS — I252 Old myocardial infarction: Secondary | ICD-10-CM

## 2019-01-25 DIAGNOSIS — D62 Acute posthemorrhagic anemia: Secondary | ICD-10-CM | POA: Diagnosis not present

## 2019-01-25 DIAGNOSIS — I2510 Atherosclerotic heart disease of native coronary artery without angina pectoris: Secondary | ICD-10-CM | POA: Diagnosis not present

## 2019-01-25 DIAGNOSIS — E669 Obesity, unspecified: Secondary | ICD-10-CM | POA: Diagnosis present

## 2019-01-25 DIAGNOSIS — Z6824 Body mass index (BMI) 24.0-24.9, adult: Secondary | ICD-10-CM

## 2019-01-25 DIAGNOSIS — M25762 Osteophyte, left knee: Secondary | ICD-10-CM | POA: Diagnosis not present

## 2019-01-25 DIAGNOSIS — M1712 Unilateral primary osteoarthritis, left knee: Principal | ICD-10-CM | POA: Diagnosis present

## 2019-01-25 DIAGNOSIS — E785 Hyperlipidemia, unspecified: Secondary | ICD-10-CM | POA: Diagnosis present

## 2019-01-25 DIAGNOSIS — Z7982 Long term (current) use of aspirin: Secondary | ICD-10-CM

## 2019-01-25 DIAGNOSIS — Z7902 Long term (current) use of antithrombotics/antiplatelets: Secondary | ICD-10-CM

## 2019-01-25 DIAGNOSIS — Z20828 Contact with and (suspected) exposure to other viral communicable diseases: Secondary | ICD-10-CM | POA: Diagnosis present

## 2019-01-25 DIAGNOSIS — K219 Gastro-esophageal reflux disease without esophagitis: Secondary | ICD-10-CM | POA: Diagnosis present

## 2019-01-25 DIAGNOSIS — F419 Anxiety disorder, unspecified: Secondary | ICD-10-CM | POA: Diagnosis present

## 2019-01-25 DIAGNOSIS — H409 Unspecified glaucoma: Secondary | ICD-10-CM | POA: Diagnosis present

## 2019-01-25 DIAGNOSIS — M170 Bilateral primary osteoarthritis of knee: Secondary | ICD-10-CM | POA: Diagnosis not present

## 2019-01-25 DIAGNOSIS — Z883 Allergy status to other anti-infective agents status: Secondary | ICD-10-CM

## 2019-01-25 DIAGNOSIS — G8929 Other chronic pain: Secondary | ICD-10-CM | POA: Diagnosis present

## 2019-01-25 DIAGNOSIS — Z888 Allergy status to other drugs, medicaments and biological substances status: Secondary | ICD-10-CM

## 2019-01-25 HISTORY — PX: TOTAL KNEE ARTHROPLASTY: SHX125

## 2019-01-25 LAB — TYPE AND SCREEN
ABO/RH(D): O POS
Antibody Screen: NEGATIVE

## 2019-01-25 SURGERY — ARTHROPLASTY, KNEE, TOTAL
Anesthesia: Spinal | Site: Knee | Laterality: Left

## 2019-01-25 MED ORDER — ASPIRIN EC 81 MG PO TBEC: 81.0000 mg | DELAYED_RELEASE_TABLET | Freq: Two times a day (BID) | ORAL | 0 refills | Status: DC

## 2019-01-25 MED ORDER — HYDROCHLOROTHIAZIDE 25 MG PO TABS
25.0000 mg | ORAL_TABLET | Freq: Every day | ORAL | Status: DC
  Administered 2019-01-27: 25 mg via ORAL
  Filled 2019-01-25 (×2): qty 1

## 2019-01-25 MED ORDER — ATORVASTATIN CALCIUM 40 MG PO TABS
40.0000 mg | ORAL_TABLET | Freq: Every day | ORAL | Status: DC
  Administered 2019-01-25 – 2019-01-27 (×3): 40 mg via ORAL
  Filled 2019-01-25 (×3): qty 1

## 2019-01-25 MED ORDER — ALUM & MAG HYDROXIDE-SIMETH 200-200-20 MG/5ML PO SUSP: 30.0000 mL | ORAL | Status: DC | PRN

## 2019-01-25 MED ORDER — ISOSORBIDE MONONITRATE ER 60 MG PO TB24
60.0000 mg | ORAL_TABLET | Freq: Every day | ORAL | Status: DC
  Administered 2019-01-26 – 2019-01-27 (×2): 60 mg via ORAL
  Filled 2019-01-25 (×2): qty 1

## 2019-01-25 MED ORDER — PROPOFOL 500 MG/50ML IV EMUL
INTRAVENOUS | Status: DC | PRN
  Administered 2019-01-25: 75 ug/kg/min via INTRAVENOUS

## 2019-01-25 MED ORDER — BISACODYL 5 MG PO TBEC
5.0000 mg | DELAYED_RELEASE_TABLET | Freq: Every day | ORAL | Status: DC | PRN
  Administered 2019-01-28: 5 mg via ORAL
  Filled 2019-01-25: qty 1

## 2019-01-25 MED ORDER — LIDOCAINE 2% (20 MG/ML) 5 ML SYRINGE
INTRAMUSCULAR | Status: AC
  Filled 2019-01-25: qty 5

## 2019-01-25 MED ORDER — CLOPIDOGREL BISULFATE 75 MG PO TABS
75.0000 mg | ORAL_TABLET | Freq: Every day | ORAL | Status: DC
  Administered 2019-01-25 – 2019-01-28 (×4): 75 mg via ORAL
  Filled 2019-01-25 (×4): qty 1

## 2019-01-25 MED ORDER — SODIUM CHLORIDE (PF) 0.9 % IJ SOLN
INTRAMUSCULAR | Status: AC
  Filled 2019-01-25: qty 20

## 2019-01-25 MED ORDER — HYDROMORPHONE HCL 1 MG/ML IJ SOLN: 0.2500 mg | INTRAMUSCULAR | Status: DC | PRN

## 2019-01-25 MED ORDER — LACTATED RINGERS IV SOLN
INTRAVENOUS | Status: DC
  Administered 2019-01-25 (×2): via INTRAVENOUS

## 2019-01-25 MED ORDER — OXYCODONE HCL 5 MG PO TABS
5.0000 mg | ORAL_TABLET | ORAL | Status: DC | PRN
  Administered 2019-01-25: 5 mg via ORAL
  Administered 2019-01-25: 10 mg via ORAL
  Filled 2019-01-25: qty 1
  Filled 2019-01-25: qty 2

## 2019-01-25 MED ORDER — PHENYLEPHRINE HCL (PRESSORS) 10 MG/ML IV SOLN
INTRAVENOUS | Status: AC
  Filled 2019-01-25: qty 1

## 2019-01-25 MED ORDER — METOCLOPRAMIDE HCL 5 MG/ML IJ SOLN: 5.0000 mg | Freq: Three times a day (TID) | INTRAMUSCULAR | Status: DC | PRN

## 2019-01-25 MED ORDER — WATER FOR IRRIGATION, STERILE IR SOLN
Status: DC | PRN
  Administered 2019-01-25: 2000 mL

## 2019-01-25 MED ORDER — BUPIVACAINE HCL (PF) 0.25 % IJ SOLN
INTRAMUSCULAR | Status: AC
  Filled 2019-01-25: qty 30

## 2019-01-25 MED ORDER — CLONIDINE HCL (ANALGESIA) 100 MCG/ML EP SOLN
EPIDURAL | Status: DC | PRN
  Administered 2019-01-25: 70 ug

## 2019-01-25 MED ORDER — DEXAMETHASONE SODIUM PHOSPHATE 10 MG/ML IJ SOLN
INTRAMUSCULAR | Status: DC | PRN
  Administered 2019-01-25: 4 mg via INTRAVENOUS

## 2019-01-25 MED ORDER — DIPHENHYDRAMINE HCL 12.5 MG/5ML PO ELIX: 12.5000 mg | ORAL_SOLUTION | ORAL | Status: DC | PRN

## 2019-01-25 MED ORDER — MENTHOL 3 MG MT LOZG: 1.0000 | LOZENGE | OROMUCOSAL | Status: DC | PRN

## 2019-01-25 MED ORDER — NITROGLYCERIN 0.4 MG SL SUBL: 0.4000 mg | SUBLINGUAL_TABLET | SUBLINGUAL | Status: DC | PRN

## 2019-01-25 MED ORDER — METHOCARBAMOL 500 MG PO TABS: 500.0000 mg | ORAL_TABLET | Freq: Four times a day (QID) | ORAL | Status: DC | PRN

## 2019-01-25 MED ORDER — LATANOPROST 0.005 % OP SOLN
1.0000 [drp] | Freq: Every day | OPHTHALMIC | Status: DC
  Administered 2019-01-25 – 2019-01-27 (×3): 1 [drp] via OPHTHALMIC
  Filled 2019-01-25: qty 2.5

## 2019-01-25 MED ORDER — ONDANSETRON HCL 4 MG/2ML IJ SOLN
INTRAMUSCULAR | Status: DC | PRN
  Administered 2019-01-25: 4 mg via INTRAVENOUS

## 2019-01-25 MED ORDER — LIDOCAINE HCL (CARDIAC) PF 100 MG/5ML IV SOSY
PREFILLED_SYRINGE | INTRAVENOUS | Status: DC | PRN
  Administered 2019-01-25: 60 mg via INTRAVENOUS

## 2019-01-25 MED ORDER — SODIUM CHLORIDE 0.9 % IR SOLN
Status: DC | PRN
  Administered 2019-01-25 (×2): 1000 mL

## 2019-01-25 MED ORDER — GABAPENTIN 300 MG PO CAPS
300.0000 mg | ORAL_CAPSULE | Freq: Three times a day (TID) | ORAL | Status: DC
  Administered 2019-01-25 – 2019-01-28 (×9): 300 mg via ORAL
  Filled 2019-01-25 (×9): qty 1

## 2019-01-25 MED ORDER — AMLODIPINE BESYLATE 10 MG PO TABS
10.0000 mg | ORAL_TABLET | Freq: Every day | ORAL | Status: DC
  Administered 2019-01-27: 10 mg via ORAL
  Filled 2019-01-25: qty 1

## 2019-01-25 MED ORDER — OXYCODONE-ACETAMINOPHEN 5-325 MG PO TABS: 1.0000 | ORAL_TABLET | ORAL | 0 refills | Status: DC | PRN

## 2019-01-25 MED ORDER — ONDANSETRON HCL 4 MG PO TABS: 4.0000 mg | ORAL_TABLET | Freq: Four times a day (QID) | ORAL | Status: DC | PRN

## 2019-01-25 MED ORDER — MIDAZOLAM HCL 2 MG/2ML IJ SOLN
1.0000 mg | INTRAMUSCULAR | Status: DC | PRN
  Filled 2019-01-25: qty 2

## 2019-01-25 MED ORDER — ASPIRIN 81 MG PO CHEW
81.0000 mg | CHEWABLE_TABLET | Freq: Two times a day (BID) | ORAL | Status: DC
  Administered 2019-01-25 – 2019-01-28 (×6): 81 mg via ORAL
  Filled 2019-01-25 (×6): qty 1

## 2019-01-25 MED ORDER — PROPOFOL 10 MG/ML IV BOLUS
INTRAVENOUS | Status: DC | PRN
  Administered 2019-01-25: 20 mg via INTRAVENOUS

## 2019-01-25 MED ORDER — ACETAMINOPHEN 325 MG PO TABS
325.0000 mg | ORAL_TABLET | Freq: Four times a day (QID) | ORAL | Status: DC | PRN
  Administered 2019-01-26 – 2019-01-28 (×6): 650 mg via ORAL
  Filled 2019-01-25 (×6): qty 2

## 2019-01-25 MED ORDER — PROMETHAZINE HCL 25 MG/ML IJ SOLN: 6.2500 mg | INTRAMUSCULAR | Status: DC | PRN

## 2019-01-25 MED ORDER — PROPOFOL 500 MG/50ML IV EMUL
INTRAVENOUS | Status: AC
  Filled 2019-01-25: qty 50

## 2019-01-25 MED ORDER — HYDROMORPHONE HCL 1 MG/ML IJ SOLN
0.5000 mg | INTRAMUSCULAR | Status: DC | PRN
  Administered 2019-01-25: 0.5 mg via INTRAVENOUS
  Filled 2019-01-25: qty 1

## 2019-01-25 MED ORDER — SODIUM CHLORIDE (PF) 0.9 % IJ SOLN
INTRAMUSCULAR | Status: DC | PRN
  Administered 2019-01-25: 70 mL

## 2019-01-25 MED ORDER — POLYETHYLENE GLYCOL 3350 17 G PO PACK: 17.0000 g | PACK | Freq: Every day | ORAL | Status: DC | PRN

## 2019-01-25 MED ORDER — PAROXETINE HCL 20 MG PO TABS
40.0000 mg | ORAL_TABLET | Freq: Every day | ORAL | Status: DC
  Administered 2019-01-25 – 2019-01-27 (×3): 40 mg via ORAL
  Filled 2019-01-25 (×4): qty 2

## 2019-01-25 MED ORDER — SODIUM CHLORIDE (PF) 0.9 % IJ SOLN
INTRAMUSCULAR | Status: AC
  Filled 2019-01-25: qty 50

## 2019-01-25 MED ORDER — METOPROLOL SUCCINATE ER 50 MG PO TB24
50.0000 mg | ORAL_TABLET | Freq: Every day | ORAL | Status: DC
  Administered 2019-01-27: 50 mg via ORAL
  Filled 2019-01-25: qty 1

## 2019-01-25 MED ORDER — PHENOL 1.4 % MT LIQD
1.0000 | OROMUCOSAL | Status: DC | PRN
  Filled 2019-01-25: qty 177

## 2019-01-25 MED ORDER — SODIUM CHLORIDE 0.9 % IV SOLN
INTRAVENOUS | Status: DC | PRN
  Administered 2019-01-25: 10:00:00 50 ug/min via INTRAVENOUS

## 2019-01-25 MED ORDER — BUPIVACAINE IN DEXTROSE 0.75-8.25 % IT SOLN
INTRATHECAL | Status: DC | PRN
  Administered 2019-01-25: 1.6 mL via INTRATHECAL

## 2019-01-25 MED ORDER — TRANEXAMIC ACID-NACL 1000-0.7 MG/100ML-% IV SOLN
1000.0000 mg | INTRAVENOUS | Status: AC
  Administered 2019-01-25: 1000 mg via INTRAVENOUS
  Filled 2019-01-25: qty 100

## 2019-01-25 MED ORDER — FLEET ENEMA 7-19 GM/118ML RE ENEM: 1.0000 | ENEMA | Freq: Once | RECTAL | Status: DC | PRN

## 2019-01-25 MED ORDER — ONDANSETRON HCL 4 MG/2ML IJ SOLN: 4.0000 mg | Freq: Four times a day (QID) | INTRAMUSCULAR | Status: DC | PRN

## 2019-01-25 MED ORDER — TRANEXAMIC ACID-NACL 1000-0.7 MG/100ML-% IV SOLN
1000.0000 mg | Freq: Once | INTRAVENOUS | Status: AC
  Administered 2019-01-25: 14:00:00 1000 mg via INTRAVENOUS
  Filled 2019-01-25: qty 100

## 2019-01-25 MED ORDER — KCL IN DEXTROSE-NACL 20-5-0.45 MEQ/L-%-% IV SOLN
INTRAVENOUS | Status: DC
  Administered 2019-01-25 (×3): via INTRAVENOUS
  Filled 2019-01-25 (×3): qty 1000

## 2019-01-25 MED ORDER — BUPIVACAINE LIPOSOME 1.3 % IJ SUSP
INTRAMUSCULAR | Status: DC | PRN
  Administered 2019-01-25: 20 mL

## 2019-01-25 MED ORDER — BUPIVACAINE HCL (PF) 0.25 % IJ SOLN
INTRAMUSCULAR | Status: DC | PRN
  Administered 2019-01-25: 30 mL

## 2019-01-25 MED ORDER — POVIDONE-IODINE 10 % EX SWAB
2.0000 "application " | Freq: Once | CUTANEOUS | Status: AC
  Administered 2019-01-25: 2 via TOPICAL

## 2019-01-25 MED ORDER — ROPIVACAINE HCL 5 MG/ML IJ SOLN
INTRAMUSCULAR | Status: DC | PRN
  Administered 2019-01-25: 20 mL via PERINEURAL

## 2019-01-25 MED ORDER — CHLORHEXIDINE GLUCONATE 4 % EX LIQD: 60.0000 mL | Freq: Once | CUTANEOUS | Status: DC

## 2019-01-25 MED ORDER — BENAZEPRIL HCL 20 MG PO TABS
40.0000 mg | ORAL_TABLET | Freq: Every day | ORAL | Status: DC
  Administered 2019-01-27: 40 mg via ORAL
  Filled 2019-01-25: qty 2

## 2019-01-25 MED ORDER — VITAMIN D 1000 UNITS PO TABS
1000.0000 [IU] | ORAL_TABLET | Freq: Every day | ORAL | Status: DC
  Administered 2019-01-25: 1000 [IU] via ORAL
  Filled 2019-01-25 (×3): qty 1

## 2019-01-25 MED ORDER — VANCOMYCIN HCL IN DEXTROSE 1-5 GM/200ML-% IV SOLN
1000.0000 mg | INTRAVENOUS | Status: AC
  Administered 2019-01-25: 1000 mg via INTRAVENOUS
  Filled 2019-01-25: qty 200

## 2019-01-25 MED ORDER — METHOCARBAMOL 500 MG IVPB - SIMPLE MED
500.0000 mg | Freq: Four times a day (QID) | INTRAVENOUS | Status: DC | PRN
  Filled 2019-01-25: qty 50

## 2019-01-25 MED ORDER — DEXAMETHASONE SODIUM PHOSPHATE 10 MG/ML IJ SOLN
INTRAMUSCULAR | Status: AC
  Filled 2019-01-25: qty 1

## 2019-01-25 MED ORDER — CEFAZOLIN SODIUM-DEXTROSE 2-4 GM/100ML-% IV SOLN
2.0000 g | INTRAVENOUS | Status: DC
  Filled 2019-01-25: qty 100

## 2019-01-25 MED ORDER — ONDANSETRON HCL 4 MG/2ML IJ SOLN
INTRAMUSCULAR | Status: AC
  Filled 2019-01-25: qty 2

## 2019-01-25 MED ORDER — TRANEXAMIC ACID 1000 MG/10ML IV SOLN
INTRAVENOUS | Status: DC | PRN
  Administered 2019-01-25: 2000 mg via TOPICAL

## 2019-01-25 MED ORDER — DOCUSATE SODIUM 100 MG PO CAPS
100.0000 mg | ORAL_CAPSULE | Freq: Two times a day (BID) | ORAL | Status: DC
  Administered 2019-01-25 – 2019-01-28 (×6): 100 mg via ORAL
  Filled 2019-01-25 (×6): qty 1

## 2019-01-25 MED ORDER — METOCLOPRAMIDE HCL 5 MG PO TABS: 5.0000 mg | ORAL_TABLET | Freq: Three times a day (TID) | ORAL | Status: DC | PRN

## 2019-01-25 MED ORDER — PANTOPRAZOLE SODIUM 40 MG PO TBEC
40.0000 mg | DELAYED_RELEASE_TABLET | Freq: Every day | ORAL | Status: DC
  Administered 2019-01-25 – 2019-01-28 (×4): 40 mg via ORAL
  Filled 2019-01-25 (×4): qty 1

## 2019-01-25 MED ORDER — DEXAMETHASONE SODIUM PHOSPHATE 10 MG/ML IJ SOLN
10.0000 mg | Freq: Once | INTRAMUSCULAR | Status: AC
  Administered 2019-01-26: 10 mg via INTRAVENOUS
  Filled 2019-01-25: qty 1

## 2019-01-25 MED ORDER — FENTANYL CITRATE (PF) 100 MCG/2ML IJ SOLN
50.0000 ug | INTRAMUSCULAR | Status: DC | PRN
  Administered 2019-01-25: 75 ug via INTRAVENOUS
  Filled 2019-01-25: qty 2

## 2019-01-25 MED ORDER — CYCLOBENZAPRINE HCL 5 MG PO TABS: 5.0000 mg | ORAL_TABLET | Freq: Three times a day (TID) | ORAL | 0 refills | Status: DC | PRN

## 2019-01-25 SURGICAL SUPPLY — 56 items
ATTUNE MED DOME PAT 38 KNEE (Knees) ×1 IMPLANT
ATTUNE MED DOME PAT 38MM KNEE (Knees) ×1 IMPLANT
ATTUNE PS FEM LT SZ 6 CEM KNEE (Femur) ×2 IMPLANT
ATTUNE PSRP INSR SZ6 5 KNEE (Insert) ×1 IMPLANT
ATTUNE PSRP INSR SZ6 5MM KNEE (Insert) ×1 IMPLANT
BAG DECANTER FOR FLEXI CONT (MISCELLANEOUS) ×3 IMPLANT
BAG SPEC THK2 15X12 ZIP CLS (MISCELLANEOUS) ×1
BAG ZIPLOCK 12X15 (MISCELLANEOUS) ×3 IMPLANT
BASE TIBIAL ROT PLAT SZ 7 KNEE (Knees) IMPLANT
BLADE SAG 18X100X1.27 (BLADE) ×3 IMPLANT
BLADE SAW SGTL 11.0X1.19X90.0M (BLADE) ×3 IMPLANT
BLADE SURG SZ10 CARB STEEL (BLADE) ×6 IMPLANT
BNDG CMPR MED 10X6 ELC LF (GAUZE/BANDAGES/DRESSINGS) ×1
BNDG CMPR MED 15X6 ELC VLCR LF (GAUZE/BANDAGES/DRESSINGS) ×1
BNDG ELASTIC 6X10 VLCR STRL LF (GAUZE/BANDAGES/DRESSINGS) ×3 IMPLANT
BNDG ELASTIC 6X15 VLCR STRL LF (GAUZE/BANDAGES/DRESSINGS) ×2 IMPLANT
BOWL SMART MIX CTS (DISPOSABLE) ×3 IMPLANT
BSPLAT TIB 7 CMNT ROT PLAT STR (Knees) ×1 IMPLANT
CEMENT HV SMART SET (Cement) ×6 IMPLANT
COVER SURGICAL LIGHT HANDLE (MISCELLANEOUS) ×3 IMPLANT
COVER WAND RF STERILE (DRAPES) IMPLANT
CUFF TOURN SGL QUICK 34 (TOURNIQUET CUFF) ×3
CUFF TRNQT CYL 34X4.125X (TOURNIQUET CUFF) ×1 IMPLANT
DECANTER SPIKE VIAL GLASS SM (MISCELLANEOUS) ×9 IMPLANT
DRAPE U-SHAPE 47X51 STRL (DRAPES) ×3 IMPLANT
DRSG AQUACEL AG ADV 3.5X10 (GAUZE/BANDAGES/DRESSINGS) ×3 IMPLANT
DURAPREP 26ML APPLICATOR (WOUND CARE) ×3 IMPLANT
ELECT REM PT RETURN 15FT ADLT (MISCELLANEOUS) ×3 IMPLANT
GLOVE BIO SURGEON STRL SZ7.5 (GLOVE) ×3 IMPLANT
GLOVE BIO SURGEON STRL SZ8.5 (GLOVE) ×3 IMPLANT
GLOVE BIOGEL PI IND STRL 8 (GLOVE) ×1 IMPLANT
GLOVE BIOGEL PI IND STRL 9 (GLOVE) ×1 IMPLANT
GLOVE BIOGEL PI INDICATOR 8 (GLOVE) ×2
GLOVE BIOGEL PI INDICATOR 9 (GLOVE) ×2
GOWN STRL REUS W/TWL XL LVL3 (GOWN DISPOSABLE) ×6 IMPLANT
HANDPIECE INTERPULSE COAX TIP (DISPOSABLE) ×3
HOOD PEEL AWAY FLYTE STAYCOOL (MISCELLANEOUS) ×9 IMPLANT
KIT TURNOVER KIT A (KITS) IMPLANT
NDL HYPO 21X1.5 SAFETY (NEEDLE) ×2 IMPLANT
NEEDLE HYPO 21X1.5 SAFETY (NEEDLE) ×6 IMPLANT
NS IRRIG 1000ML POUR BTL (IV SOLUTION) ×3 IMPLANT
PACK ICE MAXI GEL EZY WRAP (MISCELLANEOUS) ×3 IMPLANT
PACK TOTAL KNEE CUSTOM (KITS) ×3 IMPLANT
PIN STEINMAN FIXATION KNEE (PIN) ×2 IMPLANT
PROTECTOR NERVE ULNAR (MISCELLANEOUS) ×3 IMPLANT
SET HNDPC FAN SPRY TIP SCT (DISPOSABLE) ×1 IMPLANT
SUT VIC AB 1 CTX 36 (SUTURE) ×3
SUT VIC AB 1 CTX36XBRD ANBCTR (SUTURE) ×1 IMPLANT
SUT VIC AB 3-0 CT1 27 (SUTURE) ×9
SUT VIC AB 3-0 CT1 TAPERPNT 27 (SUTURE) ×3 IMPLANT
SYR CONTROL 10ML LL (SYRINGE) ×6 IMPLANT
TIBIAL BASE ROT PLAT SZ 7 KNEE (Knees) ×3 IMPLANT
TRAY FOLEY MTR SLVR 16FR STAT (SET/KITS/TRAYS/PACK) ×3 IMPLANT
WATER STERILE IRR 1000ML POUR (IV SOLUTION) ×6 IMPLANT
WRAP KNEE MAXI GEL POST OP (GAUZE/BANDAGES/DRESSINGS) ×2 IMPLANT
YANKAUER SUCT BULB TIP 10FT TU (MISCELLANEOUS) ×3 IMPLANT

## 2019-01-25 NOTE — Op Note (Signed)
PATIENT ID:      Teresa Holloway  MRN:     [id number] DOB/AGE:    12-08-1940 / 78 y.o.       OPERATIVE REPORT   DATE OF PROCEDURE:  01/25/2019      PREOPERATIVE DIAGNOSIS:   LEFT KNEE OSTEOARTHRITIS      Estimated body mass index is 24.49 kg/m as calculated from the following:   Height as of 01/18/19: 5\' 8"  (1.727 m).   Weight as of 01/18/19: 73.1 kg.                                                       POSTOPERATIVE DIAGNOSIS:   Same                                                                    PROCEDURE:  Procedure(s): LEFT TOTAL KNEE ARTHROPLASTY Using DepuyAttune RP implants #6L Femur, #7Tibia, 5 mm Attune RP bearing, 38 Patella    SURGEON: Nestor Lewandowsky  ASSISTANT:   Tomi Likens. [employer]   (Present and scrubbed throughout the case, critical for assistance with exposure, retraction, instrumentation, and closure.)        ANESTHESIA: Spinal, 20cc Exparel, 50cc 0.25% Marcaine EBL: 400 cc FLUID REPLACEMENT: 1600 cc crystaloid TOURNIQUET: DRAINS: None TRANEXAMIC ACID: 1gm IV, 2gm topical COMPLICATIONS:  None         INDICATIONS FOR PROCEDURE: The patient has  LEFT KNEE OSTEOARTHRITIS, Val deformities, XR shows bone on bone arthritis, lateral subluxation of tibia. Patient has failed all conservative measures including anti-inflammatory medicines, narcotics, attempts at exercise and weight loss, cortisone injections and viscosupplementation.  Risks and benefits of surgery have been discussed, questions answered.   DESCRIPTION OF PROCEDURE: The patient identified by armband, received  IV antibiotics, in the holding area at [facility]. Patient taken to the operating room, appropriate anesthetic monitors were attached, and Spinal anesthesia was  induced. IV Tranexamic acid was given.Tourniquet applied high to the operative thigh. Lateral post and foot positioner applied to the table, the lower extremity was then prepped and draped in usual sterile fashion from the toes to the  tourniquet. Time-out procedure was performed. The skin and subcutaneous tissue along the incision was injected with 20 cc of a mixture of Exparel and Marcaine solution, using a 20-gauge by 1-1/2 inch needle. We began the operation, with the knee flexed 130 degrees, by making the anterior midline incision starting at handbreadth above the patella going over the patella 1 cm medial to and 4 cm distal to the tibial tubercle. Small bleeders in the skin and the subcutaneous tissue identified and cauterized. Transverse retinaculum was incised and reflected medially and a medial parapatellar arthrotomy was accomplished. the patella was everted and theprepatellar fat pad resected. The superficial medial collateral ligament was then elevated from anterior to posterior along the proximal flare of the tibia and anterior half of the menisci resected. The knee was hyperflexed exposing bone on bone arthritis. Peripheral and notch osteophytes as well as the cruciate ligaments were then resected. We continued to work our way around posteriorly  along the proximal tibia, and externally rotated the tibia subluxing it out from underneath the femur. A McHale PCL retractor was placed through the notch and a lateral Hohmann retractor placed, and we then entered the proximal tibia in line with the Depuy starter drill in line with the axis of the tibia followed by an intramedullary guide rod and 0-degree posterior slope cutting guide. The tibial cutting guide, 4 degree posterior sloped, was pinned into place allowing resection of 5 mm of bone medially and 0 mm of bone laterally. Satisfied with the tibial resection, we then entered the distal femur 2 mm anterior to the PCL origin with the intramedullary guide rod and applied the distal femoral cutting guide set at 9 mm, with 5 degrees of valgus. This was pinned along the epicondylar axis. At this point, the distal femoral cut was accomplished without difficulty. We then sized for a #6L  femoral component and pinned the guide in 0 degrees of external rotation. The chamfer cutting guide was pinned into place. The anterior, posterior, and chamfer cuts were accomplished without difficulty followed by the Attune RP box cutting guide and the box cut. We also removed posterior osteophytes from the posterior femoral condyles. The posterior capsule was injected with Exparel solution. The knee was brought into full extension. We checked our extension gap and fit a 5 mm bearing. Distracting in extension with a lamina spreader,  bleeders in the posterior capsule, Posterior medial and posterior lateral gutter were cauterized.  The transexamic acid-soaked sponge was then placed in the gap of the knee in extension. The knee was flexed 30. The posterior patella cut was accomplished with the 9.5 mm Attune cutting guide, sized for a 38mm dome, and the fixation pegs drilled.The knee was then once again hyperflexed exposing the proximal tibia. We sized for a # 7 tibial base plate, applied the smokestack and the conical reamer followed by the the Delta fin keel punch. We then hammered into place the Attune RP trial femoral component, drilled the lugs, inserted a  5 mm trial bearing, trial patellar button, and took the knee through range of motion from 0-130 degrees. Medial and lateral ligamentous stability was checked. No thumb pressure was required for patellar Tracking. The tourniquet was not used. All trial components were removed, mating surfaces irrigated with pulse lavage, and dried with suction and sponges. 10 cc of the Exparel solution was applied to the cancellus bone of the patella distal femur and proximal tibia.  After waiting 30 seconds, the bony surfaces were again, dried with sponges. A double batch of DePuy HV cement was mixed and applied to all bony metallic mating surfaces except for the posterior condyles of the femur itself. In order, we hammered into place the tibial tray and removed excess  cement, the femoral component and removed excess cement. The final Attune RP bearing was inserted, and the knee brought to full extension with compression. The patellar button was clamped into place, and excess cement removed. The knee was held at 30 flexion with compression, while the cement cured. The wound was irrigated out with normal saline solution pulse lavage. The rest of the Exparel was injected into the parapatellar arthrotomy, subcutaneous tissues, and periosteal tissues. The parapatellar arthrotomy was closed with running #1 Vicryl suture. The subcutaneous tissue with 0 and 2-0 undyed Vicryl suture, and the skin with running 3-0 SQ vicryl. An Aquacil and Ace wrap were applied. The patient was taken to recovery room without difficulty.   Nestor Lewandowsky  01/25/2019, 7:29 AM

## 2019-01-25 NOTE — Transfer of Care (Signed)
Immediate Anesthesia Transfer of Care Note  Patient: Teresa Holloway  Procedure(s) Performed: LEFT TOTAL KNEE ARTHROPLASTY (Left Knee)  Patient Location: PACU  Anesthesia Type:Spinal  Level of Consciousness: awake, alert , oriented and patient cooperative  Airway & Oxygen Therapy: Patient Spontanous Breathing and Patient connected to face mask oxygen  Post-op Assessment: Report given to RN, Post -op Vital signs reviewed and stable and Patient moving all extremities  Post vital signs: Reviewed and stable  Last Vitals:  Vitals Value Taken Time  BP 99/69 01/25/19 1211  Temp    Pulse 53 01/25/19 1213  Resp 15 01/25/19 1213  SpO2 92 % 01/25/19 1213  Vitals shown include unvalidated device data.  Last Pain:  Vitals:   01/25/19 0925  TempSrc:   PainSc: 0-No pain      Patients Stated Pain Goal: 3 [phone]  Complications: No apparent anesthesia complications

## 2019-01-25 NOTE — Anesthesia Procedure Notes (Signed)
Anesthesia Procedure Image    

## 2019-01-25 NOTE — Progress Notes (Signed)
Assisted Dr. Rose with left, ultrasound guided, adductor canal block. Side rails up, monitors on throughout procedure. See vital signs in flow sheet. Tolerated Procedure well.  

## 2019-01-25 NOTE — Anesthesia Procedure Notes (Signed)
Anesthesia Regional Block: Adductor canal block   Pre-Anesthetic Checklist: ,, timeout performed, Correct Patient, Correct Site, Correct Laterality, Correct Procedure, Correct Position, site marked, Risks and benefits discussed,  Surgical consent,  Pre-op evaluation,  At surgeon's request and post-op pain management  Laterality: Left  Prep: chloraprep       Needles:  Injection technique: Single-shot  Needle Type: Echogenic Needle     Needle Length: 9cm      Additional Needles:   Procedures:,,,, ultrasound used (permanent image in chart),,,,  Narrative:  Start time: 01/25/2019 9:21 AM End time: 01/25/2019 9:27 AM Injection made incrementally with aspirations every 5 mL.  Performed by: Personally  Anesthesiologist: Myrtie Soman, MD  Additional Notes: Patient tolerated the procedure well without complications

## 2019-01-25 NOTE — Discharge Instructions (Signed)

## 2019-01-25 NOTE — Anesthesia Procedure Notes (Signed)
Spinal  Patient location during procedure: OR End time: 01/25/2019 10:11 AM Staffing Resident/CRNA: Caryl Pina T, CRNA Performed: resident/CRNA  Preanesthetic Checklist Completed: patient identified, site marked, surgical consent, pre-op evaluation, timeout performed, IV checked, risks and benefits discussed and monitors and equipment checked Spinal Block Patient position: sitting Prep: DuraPrep Patient monitoring: heart rate, cardiac monitor, continuous pulse ox and blood pressure Approach: midline Location: L3-4 Injection technique: single-shot Needle Needle type: Pencan  Needle gauge: 24 G Needle length: 9 cm Assessment Sensory level: T4 Additional Notes Expiration date of kit checked and confirmed. Patient tolerated procedure well, without complications.

## 2019-01-25 NOTE — Anesthesia Postprocedure Evaluation (Signed)
Anesthesia Post Note  Patient: Teresa Holloway  Procedure(s) Performed: LEFT TOTAL KNEE ARTHROPLASTY (Left Knee)     Patient location during evaluation: PACU Anesthesia Type: Spinal Level of consciousness: oriented and awake and alert Pain management: pain level controlled Vital Signs Assessment: post-procedure vital signs reviewed and stable Respiratory status: spontaneous breathing, respiratory function stable and patient connected to nasal cannula oxygen Cardiovascular status: blood pressure returned to baseline and stable Postop Assessment: no headache, no backache and no apparent nausea or vomiting Anesthetic complications: no    Last Vitals:  Vitals:   01/25/19 1300 01/25/19 1315  BP: (!) 108/55 (!) 94/53  Pulse: (!) 52 (!) 54  Resp: 16 15  Temp: 36.5 C 36.5 C  SpO2: 96% 95%    Last Pain:  Vitals:   01/25/19 1315  TempSrc: Oral  PainSc: 0-No pain                 Tarryn Bogdan S

## 2019-01-25 NOTE — Evaluation (Signed)
Physical Therapy Evaluation Patient Details Name: Teresa Holloway MRN: [id number] DOB: [birth year] Today's Date: 01/25/2019   History of Present Illness  L TKA; PMH of chronic opiod dependence, LBP, MI, HTN  Clinical Impression  Pt is s/p TKA resulting in the deficits listed below (see PT Problem List). Pt ambulated 10' with RW, distance limited by pain. Initiated TKA HEP.  Pt will benefit from skilled PT to increase their independence and safety with mobility to allow discharge to the venue listed below.      Follow Up Recommendations Follow surgeon's recommendation for DC plan and follow-up therapies    Equipment Recommendations  Rolling walker with 5" wheels;3in1 (PT)    Recommendations for Other Services       Precautions / Restrictions Precautions Precautions: Knee Precaution Comments: reviewed no pillow under knee Restrictions Weight Bearing Restrictions: No Other Position/Activity Restrictions: WBAT      Mobility  Bed Mobility Overal bed mobility: Needs Assistance Bed Mobility: Supine to Sit     Supine to sit: Min assist     General bed mobility comments: min A LLE  Transfers Overall transfer level: Needs assistance Equipment used: Rolling walker (2 wheeled) Transfers: Sit to/from Stand Sit to Stand: Min assist;From elevated surface         General transfer comment: min A to rise, VCs hand placement  Ambulation/Gait Ambulation/Gait assistance: Min guard;+2 safety/equipment Gait Distance (Feet): 10 Feet Assistive device: Rolling walker (2 wheeled) Gait Pattern/deviations: Step-to pattern;Decreased stride length;Antalgic Gait velocity: decr   General Gait Details: VCs sequencing, distance limited by pain & fatigue  Stairs            Wheelchair Mobility    Modified Rankin (Stroke Patients Only)       Balance Overall balance assessment: Needs assistance Sitting-balance support: Feet supported Sitting balance-Leahy Scale: Good      Standing balance support: Bilateral upper extremity supported Standing balance-Leahy Scale: Poor Standing balance comment: relies on BUE support                             Pertinent Vitals/Pain Pain Assessment: 0-10 Pain Score: 8  Pain Location: L knee Pain Descriptors / Indicators: Sore Pain Intervention(s): Limited activity within patient's tolerance;Monitored during session;Premedicated before session;Patient requesting pain meds-RN notified;Ice applied    Home Living Family/patient expects to be discharged to:: Private residence Living Arrangements: Spouse/significant other Available Help at Discharge: Family;Available 24 hours/day   Home Access: Stairs to enter Entrance Stairs-Rails: Can reach both;Left;Right Entrance Stairs-Number of Steps: 4   Home Equipment: None      Prior Function Level of Independence: Independent               Hand Dominance        Extremity/Trunk Assessment   Upper Extremity Assessment Upper Extremity Assessment: Overall WFL for tasks assessed    Lower Extremity Assessment Lower Extremity Assessment: LLE deficits/detail LLE Deficits / Details: SLR 3/5, 10-40* aAROM L knee LLE Sensation: WNL LLE Coordination: WNL    Cervical / Trunk Assessment Cervical / Trunk Assessment: Normal  Communication   Communication: No difficulties  Cognition Arousal/Alertness: Awake/alert Behavior During Therapy: WFL for tasks assessed/performed Overall Cognitive Status: Within Functional Limits for tasks assessed                                        General  Comments      Exercises Total Joint Exercises Ankle Circles/Pumps: AROM;Both;10 reps;Supine Quad Sets: AROM;Left;5 reps;Supine Heel Slides: AAROM;Left;10 reps;Supine   Assessment/Plan    PT Assessment Patient needs continued PT services  PT Problem List Decreased strength;Decreased range of motion;Decreased activity tolerance;Decreased  balance;Decreased mobility;Pain;Decreased knowledge of use of DME       PT Treatment Interventions DME instruction;Gait training;Stair training;Functional mobility training;Therapeutic exercise;Therapeutic activities;Patient/family education    PT Goals (Current goals can be found in the Care Plan section)  Acute Rehab PT Goals Patient Stated Goal: decrease pain PT Goal Formulation: With patient Time For Goal Achievement: 02/01/19 Potential to Achieve Goals: Good    Frequency 7X/week   Barriers to discharge        Co-evaluation               AM-PAC PT "6 Clicks" Mobility  Outcome Measure Help needed turning from your back to your side while in a flat bed without using bedrails?: A Little Help needed moving from lying on your back to sitting on the side of a flat bed without using bedrails?: A Little Help needed moving to and from a bed to a chair (including a wheelchair)?: A Little Help needed standing up from a chair using your arms (e.g., wheelchair or bedside chair)?: A Little Help needed to walk in hospital room?: A Little Help needed climbing 3-5 steps with a railing? : A Lot 6 Click Score: 17    End of Session Equipment Utilized During Treatment: Gait belt Activity Tolerance: Patient tolerated treatment well;Patient limited by pain Patient left: in chair;with call bell/phone within reach;with chair alarm set Nurse Communication: Mobility status PT Visit Diagnosis: Difficulty in walking, not elsewhere classified (R26.2);Pain Pain - Right/Left: Left Pain - part of body: Knee    Time: 3474-2595 PT Time Calculation (min) (ACUTE ONLY): 19 min   Charges:   PT Evaluation $PT Eval Low Complexity: 1 Low         Tamala Ser PT 01/25/2019  Acute Rehabilitation Services Pager [phone] Office [phone]

## 2019-01-25 NOTE — Care Plan (Signed)
Ortho Bundle Case Management Note  Patient Details  Name: Teresa Holloway MRN: [id number] Date of Birth: 06-23-1940  Spoke with patient prior to surgery. She plans to discharge to home with her family and HHPT. Referral to [facility]. She will transition to [place] after office follow up. Rolling walker and 3n1 ordered. MD and Patient in agreement with plan. Choice offered.                   DME Arranged:  Bedside commode, Walker rolling DME Agency:     HH Arranged:  PT HH Agency:  [facility] (formerly [facility])  Additional Comments: Please contact me with any questions of if this plan should need to change.  Ladell Heads,  [place] [facility]  [phone] 01/25/2019, 8:33 AM

## 2019-01-25 NOTE — Interval H&P Note (Signed)
History and Physical Interval Note:  01/25/2019 7:30 AM  Teresa Holloway  has presented today for surgery, with the diagnosis of LEFT KNEE OSTEOARTHRITIS.  The various methods of treatment have been discussed with the patient and family. After consideration of risks, benefits and other options for treatment, the patient has consented to  Procedure(s): LEFT TOTAL KNEE ARTHROPLASTY (Left) as a surgical intervention.  The patient's history has been reviewed, patient examined, no change in status, stable for surgery.  I have reviewed the patient's chart and labs.  Questions were answered to the patient's satisfaction.     Kerin Salen

## 2019-01-26 ENCOUNTER — Encounter (HOSPITAL_COMMUNITY): Payer: Self-pay | Admitting: Orthopedic Surgery

## 2019-01-26 DIAGNOSIS — M25762 Osteophyte, left knee: Secondary | ICD-10-CM | POA: Diagnosis present

## 2019-01-26 DIAGNOSIS — S52501A Unspecified fracture of the lower end of right radius, initial encounter for closed fracture: Secondary | ICD-10-CM | POA: Diagnosis not present

## 2019-01-26 DIAGNOSIS — Z883 Allergy status to other anti-infective agents status: Secondary | ICD-10-CM | POA: Diagnosis not present

## 2019-01-26 DIAGNOSIS — I10 Essential (primary) hypertension: Secondary | ICD-10-CM | POA: Diagnosis present

## 2019-01-26 DIAGNOSIS — Z7982 Long term (current) use of aspirin: Secondary | ICD-10-CM | POA: Diagnosis not present

## 2019-01-26 DIAGNOSIS — I2510 Atherosclerotic heart disease of native coronary artery without angina pectoris: Secondary | ICD-10-CM | POA: Diagnosis present

## 2019-01-26 DIAGNOSIS — G8929 Other chronic pain: Secondary | ICD-10-CM | POA: Diagnosis present

## 2019-01-26 DIAGNOSIS — D62 Acute posthemorrhagic anemia: Secondary | ICD-10-CM | POA: Diagnosis not present

## 2019-01-26 DIAGNOSIS — M810 Age-related osteoporosis without current pathological fracture: Secondary | ICD-10-CM | POA: Diagnosis present

## 2019-01-26 DIAGNOSIS — K219 Gastro-esophageal reflux disease without esophagitis: Secondary | ICD-10-CM | POA: Diagnosis present

## 2019-01-26 DIAGNOSIS — M170 Bilateral primary osteoarthritis of knee: Secondary | ICD-10-CM | POA: Diagnosis present

## 2019-01-26 DIAGNOSIS — Z20828 Contact with and (suspected) exposure to other viral communicable diseases: Secondary | ICD-10-CM | POA: Diagnosis present

## 2019-01-26 DIAGNOSIS — E669 Obesity, unspecified: Secondary | ICD-10-CM | POA: Diagnosis present

## 2019-01-26 DIAGNOSIS — S52571A Other intraarticular fracture of lower end of right radius, initial encounter for closed fracture: Secondary | ICD-10-CM | POA: Diagnosis not present

## 2019-01-26 DIAGNOSIS — Z6824 Body mass index (BMI) 24.0-24.9, adult: Secondary | ICD-10-CM | POA: Diagnosis not present

## 2019-01-26 DIAGNOSIS — F419 Anxiety disorder, unspecified: Secondary | ICD-10-CM | POA: Diagnosis present

## 2019-01-26 DIAGNOSIS — Z79899 Other long term (current) drug therapy: Secondary | ICD-10-CM | POA: Diagnosis not present

## 2019-01-26 DIAGNOSIS — I252 Old myocardial infarction: Secondary | ICD-10-CM | POA: Diagnosis not present

## 2019-01-26 DIAGNOSIS — Z888 Allergy status to other drugs, medicaments and biological substances status: Secondary | ICD-10-CM | POA: Diagnosis not present

## 2019-01-26 DIAGNOSIS — Z7902 Long term (current) use of antithrombotics/antiplatelets: Secondary | ICD-10-CM | POA: Diagnosis not present

## 2019-01-26 DIAGNOSIS — M25531 Pain in right wrist: Secondary | ICD-10-CM | POA: Diagnosis not present

## 2019-01-26 DIAGNOSIS — W01198A Fall on same level from slipping, tripping and stumbling with subsequent striking against other object, initial encounter: Secondary | ICD-10-CM | POA: Diagnosis not present

## 2019-01-26 DIAGNOSIS — H409 Unspecified glaucoma: Secondary | ICD-10-CM | POA: Diagnosis present

## 2019-01-26 DIAGNOSIS — M1712 Unilateral primary osteoarthritis, left knee: Secondary | ICD-10-CM | POA: Diagnosis present

## 2019-01-26 DIAGNOSIS — E785 Hyperlipidemia, unspecified: Secondary | ICD-10-CM | POA: Diagnosis present

## 2019-01-26 LAB — BASIC METABOLIC PANEL
Anion gap: 4 — ABNORMAL LOW (ref 5–15)
BUN: 16 mg/dL (ref 8–23)
CO2: 29 mmol/L (ref 22–32)
Calcium: 8.7 mg/dL — ABNORMAL LOW (ref 8.9–10.3)
Chloride: 103 mmol/L (ref 98–111)
Creatinine, Ser: 0.82 mg/dL (ref 0.44–1.00)
GFR calc Af Amer: >60 mL/min (ref >60)
GFR calc non Af Amer: >60 mL/min (ref >60)
Glucose, Bld: 168 mg/dL — ABNORMAL HIGH (ref 70–99)
Potassium: 3.9 mmol/L (ref 3.5–5.1)
Sodium: 136 mmol/L (ref 135–145)

## 2019-01-26 LAB — CBC
HCT: 27.5 % — ABNORMAL LOW (ref 36.0–46.0)
Hemoglobin: 8.6 g/dL — ABNORMAL LOW (ref 12.0–15.0)
MCH: 30.5 pg (ref 26.0–34.0)
MCHC: 31.3 g/dL (ref 30.0–36.0)
MCV: 97.5 fL (ref 80.0–100.0)
Platelets: 241 10*3/uL (ref 150–400)
RBC: 2.82 MIL/uL — ABNORMAL LOW (ref 3.87–5.11)
RDW: 12.7 % (ref 11.5–15.5)
WBC: 8.4 10*3/uL (ref 4.0–10.5)
nRBC: 0 % (ref 0.0–0.2)

## 2019-01-26 MED ORDER — VITAMIN D 25 MCG (1000 UNIT) PO TABS
1000.0000 [IU] | ORAL_TABLET | Freq: Every day | ORAL | Status: DC
  Administered 2019-01-26 – 2019-01-28 (×3): 1000 [IU] via ORAL
  Filled 2019-01-26 (×3): qty 1

## 2019-01-26 MED ORDER — OXYCODONE HCL 5 MG PO TABS
5.0000 mg | ORAL_TABLET | ORAL | Status: DC | PRN
  Administered 2019-01-26 – 2019-01-27 (×2): 5 mg via ORAL
  Filled 2019-01-26 (×3): qty 1

## 2019-01-26 NOTE — Progress Notes (Signed)
PATIENT ID: Teresa Holloway  MRN: [id number]  DOB/AGE:  [birth year] / 78 y.o.  1 Day Post-Op Procedure(s) (LRB): LEFT TOTAL KNEE ARTHROPLASTY (Left)    PROGRESS NOTE Subjective: Patient is alert, oriented, no Nausea, no Vomiting, yes passing gas. Taking PO well. Denies SOB, Chest or Calf Pain. Using Incentive Spirometer, PAS in place. Ambulate WBAT, Patient reports pain as 2/10.Patient and nurse both report some mild confusion that may be related to pain medicine as well as some pre-existing forgetfulness. .    Objective: Vital signs in last 24 hours: Vitals:   01/25/19 1610 01/25/19 2039 01/26/19 0126 01/26/19 0434  BP: (Abnormal) 104/54 (Abnormal) 92/51 (Abnormal) 120/58 107/83  Pulse: (Abnormal) 59 77 77 100  Resp: 16 18 14 14   Temp: 97.8 F (36.6 C) 98.3 F (36.8 C) 97.7 F (36.5 C) 97.9 F (36.6 C)  TempSrc: Oral Oral Oral Oral  SpO2: 92% 94% 95% 95%  Weight:      Height:          Intake/Output from previous day: I/O last 3 completed shifts: In: 4489.3 [P.O.:1200; I.V.:3089.3; IV Piggyback:200] Out: 2800 [Urine:2700; Blood:100]   Intake/Output this shift: No intake/output data recorded.   LABORATORY DATA: Recent Labs    01/26/19 0313  WBC 8.4  HGB 8.6*  HCT 27.5*  PLT 241  NA 136  K 3.9  CL 103  CO2 29  BUN 16  CREATININE 0.82  GLUCOSE 168*  CALCIUM 8.7*    Examination: Neurologically intact ABD soft Neurovascular intact Sensation intact distally Intact pulses distally Dorsiflexion/Plantar flexion intact Incision: dressing C/D/I No cellulitis present Compartment soft}  Assessment:   1 Day Post-Op Procedure(s) (LRB): LEFT TOTAL KNEE ARTHROPLASTY (Left) ADDITIONAL DIAGNOSIS: Expected Acute Blood Loss Anemia, pain management on opioids  Patient's anticipated LOS is less than 2 midnights, meeting these requirements: - Younger than 27 - Lives within 1 hour of care - Has a competent adult at home to recover with post-op recover - NO history  of  - Chronic pain requiring opiods  - Diabetes  - Coronary Artery Disease  - Heart failure  - Heart attack  - Stroke  - DVT/VTE  - Cardiac arrhythmia  - Respiratory Failure/COPD  - Renal failure  - Anemia  - Advanced Liver disease       Plan: PT/OT WBAT, AROM and PROM  DVT Prophylaxis:  SCDx72hrs, ASA 81 mg BID x 2 weeks DISCHARGE PLAN: Home, today if passes PT. DISCHARGE NEEDS: HHPT, Walker and 3-in-1 comode seat     Kerin Salen 01/26/2019, 8:00 AM Patient ID: Teresa Holloway, female   DOB: 23-Sep-1940, 78 y.o.   MRN: [id number]

## 2019-01-26 NOTE — Progress Notes (Signed)
Physical Therapy Treatment Patient Details Name: Teresa Holloway MRN: [id number] DOB: Oct 18, 1940 Today's Date: 01/26/2019    History of Present Illness L TKA; PMH of chronic opiod dependence, LBP, MI, HTN    PT Comments    POD # 1 am session Pt pleasantly confused.  Assisted OOB to amb to bathroom.  General bed mobility comments: 50% VC's to stay on task and assist LE off bed.  General transfer comment: 75% VC's on proper hand placement with stand to sit and safety with turns.  Pt present with impaired safety cognition.  Assisted in bathroom with toilet transfer, pt near fall as she let go of walker to reach down to grab toilet paper.  Required redirection.  75% VC's on turn completion and completion and proper walker to self distance.  HIGH FALL RISK General Gait Details: 50% VC's on direction, proper walker to self distance and 75% VC's safety with turns.  Impaired safety cognition and deficit awareness.  HIGH FALL RISK.   Follow Up Recommendations  Follow surgeon's recommendation for DC plan and follow-up therapies     Equipment Recommendations  Rolling walker with 5" wheels;3in1 (PT)    Recommendations for Other Services       Precautions / Restrictions Precautions Precautions: Knee;Fall Precaution Comments: reviewed no pillow under knee Restrictions Weight Bearing Restrictions: No Other Position/Activity Restrictions: WBAT    Mobility  Bed Mobility Overal bed mobility: Needs Assistance Bed Mobility: Supine to Sit     Supine to sit: Min assist     General bed mobility comments: 50% VC's to stay on task and assist LE off bed  Transfers Overall transfer level: Needs assistance Equipment used: Rolling walker (2 wheeled) Transfers: Sit to/from [employer] Sit to Stand: Min assist Stand pivot transfers: Min assist;Mod assist       General transfer comment: 75% VC's on proper hand placement with stand to sit and safety with turns.  Pt present with  impaired safety cognition.  Assisted in bathroom with toilet transfer, pt near fall as she let go of walker to reach down to grab toilet paper.  Required redirection.  75% VC's on turn completion and completion and proper walker to self distance.  HIGH FALL RISK  Ambulation/Gait Ambulation/Gait assistance: Min assist;Mod assist Gait Distance (Feet): 45 Feet Assistive device: Rolling walker (2 wheeled) Gait Pattern/deviations: Step-to pattern;Decreased stride length;Antalgic;Narrow base of support     General Gait Details: 50% VC's on direction, proper walker to self distance and 75% VC's safety with turns.  Impaired safety cognition and deficit awareness.  HIGH FALL RISK.   Stairs             Wheelchair Mobility    Modified Rankin (Stroke Patients Only)       Balance                                            Cognition Arousal/Alertness: Awake/alert Behavior During Therapy: (confused) Overall Cognitive Status: Impaired/Different from baseline Area of Impairment: Following commands;Safety/judgement;Awareness;Problem solving                       Following Commands: Follows one step commands inconsistently Safety/Judgement: Decreased awareness of safety;Decreased awareness of deficits   Problem Solving: Requires verbal cues;Requires tactile cues General Comments: pleasantly confused / impaired safety cognition      Exercises  General Comments        Pertinent Vitals/Pain Pain Assessment: Faces Faces Pain Scale: Hurts little more Pain Location: L knee Pain Descriptors / Indicators: Sore Pain Intervention(s): Monitored during session;Repositioned;Premedicated before session    Home Living                      Prior Function            PT Goals (current goals can now be found in the care plan section) Progress towards PT goals: Progressing toward goals    Frequency    7X/week      PT Plan Current plan  remains appropriate    Co-evaluation              AM-PAC PT "6 Clicks" Mobility   Outcome Measure  Help needed turning from your back to your side while in a flat bed without using bedrails?: A Little Help needed moving from lying on your back to sitting on the side of a flat bed without using bedrails?: A Little Help needed moving to and from a bed to a chair (including a wheelchair)?: A Little Help needed standing up from a chair using your arms (e.g., wheelchair or bedside chair)?: A Little Help needed to walk in hospital room?: A Lot Help needed climbing 3-5 steps with a railing? : A Lot 6 Click Score: 16    End of Session Equipment Utilized During Treatment: Gait belt Activity Tolerance: Other (comment)(tolerated but confused) Patient left: in chair;with call bell/phone within reach;with chair alarm set Nurse Communication: Mobility status PT Visit Diagnosis: Difficulty in walking, not elsewhere classified (R26.2);Pain Pain - Right/Left: Left Pain - part of body: Knee     Time: 7829-5621 PT Time Calculation (min) (ACUTE ONLY): 34 min  Charges:  $Gait Training: 8-22 mins $Therapeutic Activity: 8-22 mins                     {Teresa Holloway  PTA Acute  Rehabilitation Services Pager      [phone] Office      [phone]

## 2019-01-26 NOTE — Progress Notes (Signed)
Physical Therapy Treatment Patient Details Name: Teresa Holloway MRN: [id number] DOB: [birth year] Today's Date: 01/26/2019    History of Present Illness L TKA; PMH of chronic opiod dependence, LBP, MI, HTN    PT Comments    POD # 1 pm session. Pt in recliner with daughter present.  Daughter ststed she will be assisting "some" at home.  Assisted out of recliner to amb to bathroom.  General transfer comment: 75% VC's on proper hand placement with sit to stand present with posterior LOB.  Assisted with toilet transfer, pt required 100% VC's on safety with turns, trageting toilet and controlling stand to sit.  Pt with tendancy to push walker to side and attempted to take a few side steps without resulting in near falll.  Required redirection.  Unsteady donning/doffing underpants as pt was unable to safely perform.  HIGH FALL RISK.General Gait Details: 50% VC's on direction, proper walker to self distance and 75% VC's safety with turns.  Impaired safety cognition and deficit awareness. Tendency to push walker too far front.   HIGH FALL RISK.  Pt is not safe to D/C to home today due to cognition/safety concerns   Follow Up Recommendations  Follow surgeon's recommendation for DC plan and follow-up therapies     Equipment Recommendations  Rolling walker with 5" wheels;3in1 (PT)    Recommendations for Other Services       Precautions / Restrictions Precautions Precautions: Knee;Fall Precaution Comments: reviewed no pillow under knee Restrictions Weight Bearing Restrictions: No Other Position/Activity Restrictions: WBAT    Mobility  Bed Mobility Overal bed mobility: Needs Assistance Bed Mobility: Sit to Supine     Supine to sit: Min assist Sit to supine: Min assist   General bed mobility comments: 50% VC's to stay on task and assist LE back onto bed.  Transfers Overall transfer level: Needs assistance Equipment used: Rolling walker (2 wheeled) Transfers: Sit to/from [employer] Sit to Stand: Min assist Stand pivot transfers: Min assist;Mod assist       General transfer comment: 75% VC's on proper hand placement with sit to stand present with posterior LOB.  Assisted with toilet transfer, pt required 100% VC's on safety with turns, trageting toilet and controlling stand to sit.  Pt with tendancy to push walker to side and attempted to take a few side steps without resulting in near falll.  Required redirection.  Unsteady donning/doffing underpants as pt was unable to safely perform.  HIGH FALL RISK.  Ambulation/Gait Ambulation/Gait assistance: Min assist;Mod assist Gait Distance (Feet): 52 Feet Assistive device: Rolling walker (2 wheeled) Gait Pattern/deviations: Step-to pattern;Decreased stride length;Antalgic;Narrow base of support     General Gait Details: 50% VC's on direction, proper walker to self distance and 75% VC's safety with turns.  Impaired safety cognition and deficit awareness. Tendency to push walker too far front.   HIGH FALL RISK.   Stairs             Wheelchair Mobility    Modified Rankin (Stroke Patients Only)       Balance                                            Cognition Arousal/Alertness: Awake/alert Behavior During Therapy: (confused) Overall Cognitive Status: Impaired/Different from baseline Area of Impairment: Following commands;Safety/judgement;Awareness;Problem solving  Following Commands: Follows one step commands inconsistently Safety/Judgement: Decreased awareness of safety;Decreased awareness of deficits   Problem Solving: Requires verbal cues;Requires tactile cues General Comments: pleasantly confused / impaired safety cognition      Exercises  10 reps knee presses, SLR and HS all required MAX VC to perform     General Comments        Pertinent Vitals/Pain Pain Assessment: Faces Faces Pain Scale: Hurts little more Pain Location: L  knee Pain Descriptors / Indicators: Sore Pain Intervention(s): Monitored during session;Repositioned;Premedicated before session    Home Living                      Prior Function            PT Goals (current goals can now be found in the care plan section) Progress towards PT goals: Progressing toward goals    Frequency    7X/week      PT Plan Current plan remains appropriate    Co-evaluation              AM-PAC PT "6 Clicks" Mobility   Outcome Measure  Help needed turning from your back to your side while in a flat bed without using bedrails?: A Little Help needed moving from lying on your back to sitting on the side of a flat bed without using bedrails?: A Little Help needed moving to and from a bed to a chair (including a wheelchair)?: A Little Help needed standing up from a chair using your arms (e.g., wheelchair or bedside chair)?: A Little Help needed to walk in hospital room?: A Lot Help needed climbing 3-5 steps with a railing? : A Lot 6 Click Score: 16    End of Session Equipment Utilized During Treatment: Gait belt Activity Tolerance: Other (comment)(tolerated but confused) Patient left: in chair;with call bell/phone within reach;with chair alarm set Nurse Communication: Mobility status PT Visit Diagnosis: Difficulty in walking, not elsewhere classified (R26.2);Pain Pain - Right/Left: Left Pain - part of body: Knee     Time: 1420-1455 PT Time Calculation (min) (ACUTE ONLY): 35 min  Charges:  $Gait Training: 8-22 mins $Therapeutic Exercise: 8-22 mins                      Felecia Shelling  PTA Acute  Rehabilitation Services Pager      [phone] Office      [phone]

## 2019-01-27 LAB — CBC
HCT: 25.1 % — ABNORMAL LOW (ref 36.0–46.0)
Hemoglobin: 8 g/dL — ABNORMAL LOW (ref 12.0–15.0)
MCH: 30.7 pg (ref 26.0–34.0)
MCHC: 31.9 g/dL (ref 30.0–36.0)
MCV: 96.2 fL (ref 80.0–100.0)
Platelets: 249 10*3/uL (ref 150–400)
RBC: 2.61 MIL/uL — ABNORMAL LOW (ref 3.87–5.11)
RDW: 13.4 % (ref 11.5–15.5)
WBC: 10.8 10*3/uL — ABNORMAL HIGH (ref 4.0–10.5)
nRBC: 0 % (ref 0.0–0.2)

## 2019-01-27 MED ORDER — TRAMADOL HCL 50 MG PO TABS
50.0000 mg | ORAL_TABLET | Freq: Four times a day (QID) | ORAL | Status: DC | PRN
  Administered 2019-01-27 – 2019-01-28 (×2): 50 mg via ORAL
  Filled 2019-01-27 (×2): qty 1

## 2019-01-27 NOTE — Progress Notes (Signed)
Physical Therapy Treatment Patient Details Name: Teresa Holloway MRN: [id number] DOB: 1940/05/11 Today's Date: 01/27/2019    History of Present Illness L TKA; PMH of chronic opiod dependence, LBP, MI, HTN    PT Comments    POD # 2 pm session Assisted OOB to amb to bathroom.  General transfer comment: 50% VC's on proper hand placement, safety with turns using walker throughout.  Assisted to bathroom.  50% VC's on safe toilet transfer.  Pt easily distracted and required redirection to complete task.  Required assist to maintain balance during donning/doffing underpants.Assisted with amb a greater distance in hallway.  General Gait Details: 25% VC's on proper walker to self distance.  Min unsteady with poor posture.  easily distracted.  Cognition improved however remains unsteady.  HIGH FALL Risk.  Too unsteady to attempt stairs. Pain meds have been changed.  Will see in am for stair training and access safety cognition for D/C.    Follow Up Recommendations  Follow surgeon's recommendation for DC plan and follow-up therapies     Equipment Recommendations  Rolling walker with 5" wheels;3in1 (PT)    Recommendations for Other Services       Precautions / Restrictions Precautions Precautions: Knee;Fall Precaution Comments: reviewed no pillow under knee Restrictions Weight Bearing Restrictions: No Other Position/Activity Restrictions: WBAT    Mobility  Bed Mobility Overal bed mobility: Needs Assistance Bed Mobility: Supine to Sit;Sit to Supine     Supine to sit: Min guard;Min assist Sit to supine: Min assist   General bed mobility comments: 25% VC's on proper tech and assist with assist L LE   Assisted OOB and back to bed  Transfers Overall transfer level: Needs assistance Equipment used: Rolling walker (2 wheeled) Transfers: Sit to/from [employer] Sit to Stand: Min guard;Min assist Stand pivot transfers: Min assist       General transfer comment: 50%  VC's on proper hand placement, safety with turns using walker throughout.  Assisted to bathroom.  50% VC's on safe toilet transfer.  Pt easily distracted and required redirection to complete task.  Required assist to maintain balance during donning/doffing underpants.  Ambulation/Gait Ambulation/Gait assistance: Min assist Gait Distance (Feet): 62 Feet Assistive device: Rolling walker (2 wheeled) Gait Pattern/deviations: Step-to pattern;Decreased stride length;Antalgic;Narrow base of support Gait velocity: decreased   General Gait Details: 25% VC's on proper walker to self distance.  Min unsteady with poor posture.  easily distracted.  Cognition improved however remains unsteady.  HIGH FALL Risk.   Stairs             Wheelchair Mobility    Modified Rankin (Stroke Patients Only)       Balance                                            Cognition Arousal/Alertness: Awake/alert   Overall Cognitive Status: Impaired/Different from baseline                         Following Commands: Follows one step commands with increased time       General Comments: improved cognition but not yet baseline      Exercises   Total Knee Replacement TE's 10 reps B LE ankle pumps 5 reps towel squeezes 5 reps knee presses 5 reps heel slides  AAROM 5 reps SLR's 5 reps ABD Required 50%  VC's on proper tech Followed by ICE    General Comments        Pertinent Vitals/Pain Pain Assessment: Faces Pain Location: L knee Pain Descriptors / Indicators: Sore;Burning Pain Intervention(s): Monitored during session;Repositioned;Patient requesting pain meds-RN notified    Home Living                      Prior Function            PT Goals (current goals can now be found in the care plan section) Acute Rehab PT Goals Patient Stated Goal: decrease pain Progress towards PT goals: Progressing toward goals    Frequency    7X/week      PT Plan  Current plan remains appropriate    Co-evaluation              AM-PAC PT "6 Clicks" Mobility   Outcome Measure  Help needed turning from your back to your side while in a flat bed without using bedrails?: A Little Help needed moving from lying on your back to sitting on the side of a flat bed without using bedrails?: A Little Help needed moving to and from a bed to a chair (including a wheelchair)?: A Little Help needed standing up from a chair using your arms (e.g., wheelchair or bedside chair)?: A Little Help needed to walk in hospital room?: A Little Help needed climbing 3-5 steps with a railing? : A Little 6 Click Score: 18    End of Session Equipment Utilized During Treatment: Gait belt   Patient left: in chair;with call bell/phone within reach;with chair alarm set Nurse Communication: Mobility status PT Visit Diagnosis: Difficulty in walking, not elsewhere classified (R26.2);Pain Pain - Right/Left: Left Pain - part of body: Knee     Time: 3244-0102 PT Time Calculation (min) (ACUTE ONLY): 34 min  Charges:  $Gait Training: 8-22 mins $Therapeutic Exercise: 8-22 mins                     Felecia Shelling  PTA Acute  Rehabilitation Services Pager      [phone] Office      [phone]

## 2019-01-27 NOTE — Progress Notes (Signed)
PATIENT ID: Teresa Holloway  MRN: [id number]  DOB/AGE:  [birth year] / 78 y.o.  2 Days Post-Op Procedure(s) (LRB): LEFT TOTAL KNEE ARTHROPLASTY (Left)    PROGRESS NOTE Subjective: Patient is alert, oriented, no Nausea, no Vomiting, yes passing gas. Taking PO well. Denies SOB, Chest or Calf Pain. Using Incentive Spirometer, PAS in place. Ambulate WBAT with pt walking 52 feet.  She did have issues following verbal cues , Patient reports pain as moderate this AM.    Objective: Vital signs in last 24 hours: Vitals:   01/26/19 0434 01/26/19 1651 01/26/19 2211 01/27/19 0549  BP: 107/83 119/61 (!) 115/40 111/74  Pulse: 100 76 78 76  Resp: 14 14 16 18   Temp: 97.9 F (36.6 C) 97.7 F (36.5 C) 98.7 F (37.1 C) 98.3 F (36.8 C)  TempSrc: Oral  Oral Oral  SpO2: 95% 93% 96% 95%  Weight:      Height:          Intake/Output from previous day: I/O last 3 completed shifts: In: 2682 [P.O.:1260; I.V.:1422] Out: 1600 [Urine:1600]   Intake/Output this shift: No intake/output data recorded.   LABORATORY DATA: Recent Labs    01/26/19 0313 01/27/19 0234  WBC 8.4 10.8*  HGB 8.6* 8.0*  HCT 27.5* 25.1*  PLT 241 249  NA 136  --   K 3.9  --   CL 103  --   CO2 29  --   BUN 16  --   CREATININE 0.82  --   GLUCOSE 168*  --   CALCIUM 8.7*  --     Examination: Neurologically intact Neurovascular intact Sensation intact distally Intact pulses distally Dorsiflexion/Plantar flexion intact Incision: dressing C/D/I and no drainage No cellulitis present Compartment soft}  Assessment:   2 Days Post-Op Procedure(s) (LRB): LEFT TOTAL KNEE ARTHROPLASTY (Left) ADDITIONAL DIAGNOSIS: Expected Acute Blood Loss Anemia, pain management Anticipated LOS equal to or greater than 2 midnights due to - Age 20 and older with one or more of the following:  - Obesity  - Expected need for hospital services (PT, OT, Nursing) required for safe  discharge  - Anticipated need for postoperative skilled nursing  care or inpatient rehab  - Active co-morbidities: Chronic pain requiring opiods OR   - Unanticipated findings during/Post Surgery: Slow post-op progression: GI, pain control, mobility    Plan: PT/OT WBAT, AROM and PROM  DVT Prophylaxis:  SCDx72hrs, ASA 81 mg BID x 2 weeks DISCHARGE PLAN: Home, today once pt passes therapy goals DISCHARGE NEEDS: HHPT, Walker and 3-in-1 comode seat     Joanell Rising 01/27/2019, 8:38 AM

## 2019-01-27 NOTE — Plan of Care (Signed)
  Problem: Health Behavior/Discharge Planning: Goal: Ability to manage health-related needs will improve Outcome: Progressing   Problem: Activity: Goal: Risk for activity intolerance will decrease Outcome: Progressing   Problem: Nutrition: Goal: Adequate nutrition will be maintained Outcome: Progressing   Problem: Coping: Goal: Level of anxiety will decrease Outcome: Progressing   Problem: Pain Managment: Goal: General experience of comfort will improve Outcome: Progressing   Problem: Education: Goal: Knowledge of the prescribed therapeutic regimen will improve Outcome: Progressing   Problem: Activity: Goal: Range of joint motion will improve Outcome: Progressing

## 2019-01-27 NOTE — Care Plan (Signed)
Following progress with therapy. Will speak with HHPT and have her seen daily at discharge and eval for HHOT need.   Ladell Heads, [place]

## 2019-01-27 NOTE — Progress Notes (Signed)
Physical Therapy Treatment Patient Details Name: Teresa Holloway MRN: [id number] DOB: 05-Jan-1941 Today's Date: 01/27/2019    History of Present Illness L TKA; PMH of chronic opiod dependence, LBP, MI, HTN    PT Comments    POD # 2 am session Assisted OOB.  General bed mobility comments: 25% VC's on proper tech and assist with assist L LE.  Assisted to bathroom.  General transfer comment: 50% VC's on proper hand placement, safety with turns using walker throughout.  Assisted to bathroom.  50% VC's on safe toilet transfer.  Pt easily distracted and required redirection to complete task.  Required assist to maintain balance during donning/doffing underpants.  Assisted with amb.  General Gait Details: 25% VC's on proper walker to self distance.  Min unsteady with poor posture.  easily distracted.  Cognition improved however remains unsteady.  HIGH FALL Risk. Pt has NOTY met goals to D/C safely to home today.  Cognition still impaired. Notified RN poss pain meds is cause?    Follow Up Recommendations  Follow surgeon's recommendation for DC plan and follow-up therapies     Equipment Recommendations  Rolling walker with 5" wheels;3in1 (PT)    Recommendations for Other Services       Precautions / Restrictions Precautions Precautions: Knee;Fall Precaution Comments: reviewed no pillow under knee Restrictions Weight Bearing Restrictions: No Other Position/Activity Restrictions: WBAT    Mobility  Bed Mobility Overal bed mobility: Needs Assistance Bed Mobility: Supine to Sit     Supine to sit: Min guard;Min assist     General bed mobility comments: 25% VC's on proper tech and assist with assist L LE  Transfers Overall transfer level: Needs assistance Equipment used: Rolling walker (2 wheeled) Transfers: Sit to/from [employer] Sit to Stand: Min guard;Min assist Stand pivot transfers: Min assist       General transfer comment: 50% VC's on proper hand  placement, safety with turns using walker throughout.  Assisted to bathroom.  50% VC's on safe toilet transfer.  Pt easily distracted and required redirection to complete task.  Required assist to maintain balance during donning/doffing underpants.  Ambulation/Gait Ambulation/Gait assistance: Min assist Gait Distance (Feet): 55 Feet Assistive device: Rolling walker (2 wheeled) Gait Pattern/deviations: Step-to pattern;Decreased stride length;Antalgic;Narrow base of support Gait velocity: decreased   General Gait Details: 25% VC's on proper walker to self distance.  Min unsteady with poor posture.  easily distracted.  Cognition improved however remains unsteady.  HIGH FALL Risk.   Stairs             Wheelchair Mobility    Modified Rankin (Stroke Patients Only)       Balance                                            Cognition Arousal/Alertness: Awake/alert   Overall Cognitive Status: Impaired/Different from baseline                         Following Commands: Follows one step commands with increased time       General Comments: improved cognition but not yet baseline      Exercises      General Comments        Pertinent Vitals/Pain Pain Assessment: Faces Pain Location: L knee Pain Descriptors / Indicators: Sore;Burning Pain Intervention(s): Monitored during session;Repositioned;Patient requesting pain meds-RN notified  Physical Therapy Treatment Patient Details Name: Teresa Holloway MRN: [id number] DOB: 08-Sep-1940 Today's Date: 01/27/2019    History of Present Illness L TKA; PMH of chronic opiod dependence, LBP, MI, HTN    PT Comments    POD # 2 am session Assisted OOB.  General bed mobility comments: 25% VC's on proper tech and assist with assist L LE.  Assisted to bathroom.  General transfer comment: 50% VC's on proper hand placement, safety with turns using walker throughout.  Assisted to bathroom.  50% VC's on safe toilet transfer.  Pt easily distracted and required redirection to complete task.  Required assist to maintain balance during donning/doffing underpants.  Assisted with amb.  General Gait Details: 25% VC's on proper walker to self distance.  Min unsteady with poor posture.  easily distracted.  Cognition improved however remains unsteady.  HIGH FALL Risk. Pt has NOTY met goals to D/C safely to home today.  Cognition still impaired. Notified RN poss pain meds is cause?    Follow Up Recommendations  Follow surgeon's recommendation for DC plan and follow-up therapies     Equipment Recommendations  Rolling walker with 5" wheels;3in1 (PT)    Recommendations for Other Services       Precautions / Restrictions Precautions Precautions: Knee;Fall Precaution Comments: reviewed no pillow under knee Restrictions Weight Bearing Restrictions: No Other Position/Activity Restrictions: WBAT    Mobility  Bed Mobility Overal bed mobility: Needs Assistance Bed Mobility: Supine to Sit     Supine to sit: Min guard;Min assist     General bed mobility comments: 25% VC's on proper tech and assist with assist L LE  Transfers Overall transfer level: Needs assistance Equipment used: Rolling walker (2 wheeled) Transfers: Sit to/from [employer] Sit to Stand: Min guard;Min assist Stand pivot transfers: Min assist       General transfer comment: 50% VC's on proper hand  placement, safety with turns using walker throughout.  Assisted to bathroom.  50% VC's on safe toilet transfer.  Pt easily distracted and required redirection to complete task.  Required assist to maintain balance during donning/doffing underpants.  Ambulation/Gait Ambulation/Gait assistance: Min assist Gait Distance (Feet): 55 Feet Assistive device: Rolling walker (2 wheeled) Gait Pattern/deviations: Step-to pattern;Decreased stride length;Antalgic;Narrow base of support Gait velocity: decreased   General Gait Details: 25% VC's on proper walker to self distance.  Min unsteady with poor posture.  easily distracted.  Cognition improved however remains unsteady.  HIGH FALL Risk.   Stairs             Wheelchair Mobility    Modified Rankin (Stroke Patients Only)       Balance                                            Cognition Arousal/Alertness: Awake/alert   Overall Cognitive Status: Impaired/Different from baseline                         Following Commands: Follows one step commands with increased time       General Comments: improved cognition but not yet baseline      Exercises      General Comments        Pertinent Vitals/Pain Pain Assessment: Faces Pain Location: L knee Pain Descriptors / Indicators: Sore;Burning Pain Intervention(s): Monitored during session;Repositioned;Patient requesting pain meds-RN notified

## 2019-01-28 LAB — CBC
HCT: 25.8 % — ABNORMAL LOW (ref 36.0–46.0)
Hemoglobin: 7.9 g/dL — ABNORMAL LOW (ref 12.0–15.0)
MCH: 30.3 pg (ref 26.0–34.0)
MCHC: 30.6 g/dL (ref 30.0–36.0)
MCV: 98.9 fL (ref 80.0–100.0)
Platelets: 255 10*3/uL (ref 150–400)
RBC: 2.61 MIL/uL — ABNORMAL LOW (ref 3.87–5.11)
RDW: 13.7 % (ref 11.5–15.5)
WBC: 7.4 10*3/uL (ref 4.0–10.5)
nRBC: 0 % (ref 0.0–0.2)

## 2019-01-28 MED ORDER — TRAMADOL HCL 50 MG PO TABS: 50.0000 mg | ORAL_TABLET | ORAL | 0 refills | Status: DC

## 2019-01-28 NOTE — Progress Notes (Signed)
PATIENT ID: Teresa Holloway  MRN: [id number]  DOB/AGE:  October 08, 1940 / 78 y.o.  3 Days Post-Op Procedure(s) (LRB): LEFT TOTAL KNEE ARTHROPLASTY (Left)    PROGRESS NOTE Subjective: Patient is alert, oriented, no Nausea, no Vomiting, yes passing gas. Taking PO well. Denies SOB, Chest or Calf Pain. Using Incentive Spirometer, PAS in place. Ambulate WBAT with pt walking 62 ft with therapy, Patient reports pain as moderate .    Objective: Vital signs in last 24 hours: Vitals:   01/27/19 0549 01/27/19 1420 01/27/19 2153 01/28/19 0605  BP: 111/74 127/72 (!) 114/52 (!) 120/56  Pulse: 76 99 86 66  Resp: 18  18 18   Temp: 98.3 F (36.8 C) 98.3 F (36.8 C) (!) 100.6 F (38.1 C) 98.4 F (36.9 C)  TempSrc: Oral Oral Oral Oral  SpO2: 95% 95% 94% 98%  Weight:      Height:          Intake/Output from previous day: I/O last 3 completed shifts: In: 700 [P.O.:700] Out: 0    Intake/Output this shift: No intake/output data recorded.   LABORATORY DATA: Recent Labs    01/26/19 0313 01/27/19 0234 01/28/19 0242  WBC 8.4 10.8* 7.4  HGB 8.6* 8.0* 7.9*  HCT 27.5* 25.1* 25.8*  PLT 241 249 255  NA 136  --   --   K 3.9  --   --   CL 103  --   --   CO2 29  --   --   BUN 16  --   --   CREATININE 0.82  --   --   GLUCOSE 168*  --   --   CALCIUM 8.7*  --   --     Examination: Neurologically intact Neurovascular intact Sensation intact distally Intact pulses distally Dorsiflexion/Plantar flexion intact Incision: dressing C/D/I and scant drainage No cellulitis present Compartment soft}  Assessment:   3 Days Post-Op Procedure(s) (LRB): LEFT TOTAL KNEE ARTHROPLASTY (Left) ADDITIONAL DIAGNOSIS: Expected Acute Blood Loss Anemia, chronic pain management Anticipated LOS equal to or greater than 2 midnights due to - Age 36 and older with one or more of the following:  - Obesity  - Expected need for hospital services (PT, OT, Nursing) required for safe  discharge   OR   - Unanticipated  findings during/Post Surgery: Slow post-op progression: GI, pain control, mobility      Plan: PT/OT WBAT, AROM and PROM  DVT Prophylaxis:  SCDx72hrs, ASA 81 mg BID x 2 weeks DISCHARGE PLAN: Home, once pt meets therapy goals DISCHARGE NEEDS: HHPT, Walker and 3-in-1 comode seat     Joanell Rising 01/28/2019, 7:46 AM

## 2019-01-28 NOTE — Progress Notes (Signed)
Physical Therapy Treatment Patient Details Name: Teresa Holloway MRN: [id number] DOB: [birth year] Today's Date: 01/28/2019    History of Present Illness L TKA; PMH of chronic opiod dependence, LBP, MI, HTN    PT Comments    POD # 3 am session  Assisted OOB to amb to bathroom.  Improved cognition close to but not yet 100%.  Still required "some" VC's on safety with turns and proper walker use throughout turning and stepping backward.  Assisted with amb.  General Gait Details: 25% VC's on proper walker to self distance.  Min unsteady with poor posture.  easily distracted.  Cognition improved.  Balance with turns is with caution. Practiced stairs.  General stair comments: 25% VC's on proper sequencing and safety.  Performed with daughter in law "hands on" instruction.Nurse Communication: Mobility status(pt will need another session for "family care "education)   Follow Up Recommendations  Home health PT     Equipment Recommendations  Rolling walker with 5" wheels;3in1 (PT)    Recommendations for Other Services       Precautions / Restrictions Precautions Precautions: Knee;Fall Precaution Comments: reviewed no pillow under knee Restrictions Weight Bearing Restrictions: No Other Position/Activity Restrictions: WBAT    Mobility  Bed Mobility Overal bed mobility: Needs Assistance Bed Mobility: Supine to Sit     Supine to sit: Min guard;Min assist     General bed mobility comments: 25% VC's on proper tech and assist with assist L LE with increased time  Transfers Overall transfer level: Needs assistance Equipment used: Rolling walker (2 wheeled) Transfers: Sit to/from [employer] Sit to Stand: Min guard;Min assist Stand pivot transfers: Min assist       General transfer comment: 25% VC's on proper hand placement from "solid surface" vs pull on walker  Ambulation/Gait Ambulation/Gait assistance: Min guard;Min assist Gait Distance (Feet): 55  Feet Assistive device: Rolling walker (2 wheeled) Gait Pattern/deviations: Step-to pattern;Decreased stride length;Antalgic;Narrow base of support Gait velocity: decreased   General Gait Details: 25% VC's on proper walker to self distance.  Min unsteady with poor posture.  easily distracted.  Cognition improved.  Balance with turns is with caution.   Stairs Stairs: Yes Stairs assistance: Min assist Stair Management: Two rails;Step to pattern;Forwards Number of Stairs: 2 General stair comments: 25% VC's on proper sequencing and safety.  Performed with daughter in law "hands on" instruction   Wheelchair Mobility    Modified Rankin (Stroke Patients Only)       Balance                                            Cognition Arousal/Alertness: Awake/alert   Overall Cognitive Status: Impaired/Different from baseline                                 General Comments: slowly improving      Exercises      General Comments        Pertinent Vitals/Pain Pain Assessment: 0-10 Pain Score: 6  Pain Location: L knee Pain Descriptors / Indicators: Sore;Burning;Discomfort;Grimacing Pain Intervention(s): Monitored during session;Repositioned;Ice applied;Patient requesting pain meds-RN notified    Home Living                      Prior Function  PT Goals (current goals can now be found in the care plan section) Progress towards PT goals: Progressing toward goals    Frequency    7X/week      PT Plan Current plan remains appropriate    Co-evaluation              AM-PAC PT "6 Clicks" Mobility   Outcome Measure  Help needed turning from your back to your side while in a flat bed without using bedrails?: A Little Help needed moving from lying on your back to sitting on the side of a flat bed without using bedrails?: A Little Help needed moving to and from a bed to a chair (including a wheelchair)?: A Little Help  needed standing up from a chair using your arms (e.g., wheelchair or bedside chair)?: A Little Help needed to walk in hospital room?: A Little Help needed climbing 3-5 steps with a railing? : A Little 6 Click Score: 18    End of Session Equipment Utilized During Treatment: Gait belt Activity Tolerance: Patient tolerated treatment well Patient left: in chair;with call bell/phone within reach;with chair alarm set Nurse Communication: Mobility status(pt will need another session for "family care "education) Pain - part of body: Knee     Time: 1020-1048 PT Time Calculation (min) (ACUTE ONLY): 28 min  Charges:  $Gait Training: 8-22 mins $Therapeutic Activity: 8-22 mins                     {Asa Fath  PTA Acute  Rehabilitation Services Pager      [phone] Office      [phone]

## 2019-01-28 NOTE — Plan of Care (Signed)
  Problem: Education: Goal: Knowledge of General Education information will improve Description: Including pain rating scale, medication(s)/side effects and non-pharmacologic comfort measures 01/28/2019 1621 by Hubert Azure, RN Outcome: Adequate for Discharge 01/28/2019 1155 by Hubert Azure, RN Outcome: Progressing   Problem: Health Behavior/Discharge Planning: Goal: Ability to manage health-related needs will improve 01/28/2019 1621 by Hubert Azure, RN Outcome: Adequate for Discharge 01/28/2019 1155 by Hubert Azure, RN Outcome: Progressing   Problem: Clinical Measurements: Goal: Ability to maintain clinical measurements within normal limits will improve 01/28/2019 1621 by Hubert Azure, RN Outcome: Adequate for Discharge 01/28/2019 1155 by Hubert Azure, RN Outcome: Progressing Goal: Will remain free from infection 01/28/2019 1621 by Hubert Azure, RN Outcome: Adequate for Discharge 01/28/2019 1155 by Hubert Azure, RN Outcome: Progressing Goal: Diagnostic test results will improve 01/28/2019 1621 by Hubert Azure, RN Outcome: Adequate for Discharge 01/28/2019 1155 by Hubert Azure, RN Outcome: Progressing Goal: Respiratory complications will improve 01/28/2019 1621 by Hubert Azure, RN Outcome: Adequate for Discharge 01/28/2019 1155 by Hubert Azure, RN Outcome: Progressing Goal: Cardiovascular complication will be avoided 01/28/2019 1621 by Hubert Azure, RN Outcome: Adequate for Discharge 01/28/2019 1155 by Hubert Azure, RN Outcome: Progressing   Problem: Activity: Goal: Risk for activity intolerance will decrease Outcome: Adequate for Discharge   Problem: Nutrition: Goal: Adequate nutrition will be maintained Outcome: Adequate for Discharge   Problem: Coping: Goal: Level of anxiety will decrease Outcome: Adequate for Discharge   Problem: Elimination: Goal: Will not experience complications related to bowel motility Outcome: Adequate for  Discharge Goal: Will not experience complications related to urinary retention Outcome: Adequate for Discharge   Problem: Pain Managment: Goal: General experience of comfort will improve Outcome: Adequate for Discharge   Problem: Safety: Goal: Ability to remain free from injury will improve Outcome: Adequate for Discharge   Problem: Skin Integrity: Goal: Risk for impaired skin integrity will decrease Outcome: Adequate for Discharge   Problem: Education: Goal: Knowledge of the prescribed therapeutic regimen will improve Outcome: Adequate for Discharge Goal: Individualized Educational Video(s) Outcome: Adequate for Discharge   Problem: Activity: Goal: Ability to avoid complications of mobility impairment will improve Outcome: Adequate for Discharge Goal: Range of joint motion will improve Outcome: Adequate for Discharge   Problem: Clinical Measurements: Goal: Postoperative complications will be avoided or minimized Outcome: Adequate for Discharge   Problem: Pain Management: Goal: Pain level will decrease with appropriate interventions Outcome: Adequate for Discharge   Problem: Skin Integrity: Goal: Will show signs of wound healing Outcome: Adequate for Discharge

## 2019-01-28 NOTE — Discharge Summary (Addendum)
Patient ID: Teresa Holloway MRN: [id number] DOB/AGE: 77/28/1942 77 y.o.  Admit date: 01/25/2019 Discharge date: 01/28/2019  Admission Diagnoses:  Principal Problem:   Degenerative arthritis of left knee Active Problems:   Status post total left knee replacement   Discharge Diagnoses:  Same  Past Medical History:  Diagnosis Date  . Anxiety   . Arthritis    Knees, hips  . CAD (coronary artery disease)   . Chronic back pain   . Depression   . GERD (gastroesophageal reflux disease)   . Hyperlipidemia   . Hypertension   . MI (myocardial infarction) (HCC)   . Osteopenia     Surgeries: Procedure(s): LEFT TOTAL KNEE ARTHROPLASTY on 01/25/2019   Consultants:   Discharged Condition: Improved  Hospital Course: Teresa Holloway is an 78 y.o. female who was admitted 01/25/2019 for operative treatment ofDegenerative arthritis of left knee. Patient has severe unremitting pain that affects sleep, daily activities, and work/hobbies. After pre-op clearance the patient was taken to the operating room on 01/25/2019 and underwent  Procedure(s): LEFT TOTAL KNEE ARTHROPLASTY.    Patient was given perioperative antibiotics:  Anti-infectives (From admission, onward)   Start     Dose/Rate Route Frequency Ordered Stop   01/25/19 0745  ceFAZolin (ANCEF) IVPB 2g/100 mL premix  Status:  Discontinued     2 g 200 mL/hr over 30 Minutes Intravenous On call to O.R. 01/25/19 0740 01/25/19 1317   01/25/19 0745  vancomycin (VANCOCIN) IVPB 1000 mg/200 mL premix     1,000 mg 200 mL/hr over 60 Minutes Intravenous On call to O.R. 01/25/19 0740 01/25/19 0949       Patient was given sequential compression devices, early ambulation, and chemoprophylaxis to prevent DVT.  Patient benefited maximally from hospital stay and there were no complications.    Recent vital signs:  Patient Vitals for the past 24 hrs:  BP Temp Temp src Pulse Resp SpO2  01/28/19 1339 (!) 123/58 (!) 97.4 F (36.3 C) Oral 71 17 97 %   01/28/19 1157 - - - - - 94 %  01/28/19 0923 (!) 103/50 98.7 F (37.1 C) Oral 68 17 93 %  01/28/19 0605 (!) 120/56 98.4 F (36.9 C) Oral 66 18 98 %  01/27/19 2153 (!) 114/52 (!) 100.6 F (38.1 C) Oral 86 18 94 %     Recent laboratory studies:  Recent Labs    01/26/19 0313 01/27/19 0234 01/28/19 0242  WBC 8.4 10.8* 7.4  HGB 8.6* 8.0* 7.9*  HCT 27.5* 25.1* 25.8*  PLT 241 249 255  NA 136  --   --   K 3.9  --   --   CL 103  --   --   CO2 29  --   --   BUN 16  --   --   CREATININE 0.82  --   --   GLUCOSE 168*  --   --   CALCIUM 8.7*  --   --      Discharge Medications:   Allergies as of 01/28/2019      Reactions   Atarax [hydroxyzine] Nausea And Vomiting   Bactrim [sulfamethoxazole-trimethoprim] Nausea Only   Coreg [carvedilol] Other (See Comments)   Unknown      Medication List    STOP taking these medications   aspirin 81 MG tablet Replaced by: aspirin EC 81 MG tablet   ciprofloxacin 500 MG tablet Commonly known as: Cipro   HYDROcodone-acetaminophen 5-325 MG tablet Commonly known as: Norco  TAKE these medications   alendronate 70 MG tablet Commonly known as: FOSAMAX TAKE 1 TABLE BY MOUTH EVERY 7 DAYS. TAKE WITH A FULL GLASS OF WATER ON AN EMPTY STOMACH.   amLODipine 10 MG tablet Commonly known as: NORVASC Take 1 tablet (10 mg total) by mouth daily. What changed: when to take this   aspirin EC 81 MG tablet Take 1 tablet (81 mg total) by mouth 2 (two) times daily. Replaces: aspirin 81 MG tablet   atorvastatin 40 MG tablet Commonly known as: LIPITOR Take 1 tablet (40 mg total) by mouth daily at 6 PM.   benazepril 40 MG tablet Commonly known as: LOTENSIN Take 1 tablet (40 mg total) by mouth daily. What changed: when to take this   cholecalciferol 1000 units tablet Commonly known as: VITAMIN D Take 1,000 Units by mouth daily.   clopidogrel 75 MG tablet Commonly known as: PLAVIX Take 1 tablet (75 mg total) by mouth daily. What changed: when  to take this   cyclobenzaprine 5 MG tablet Commonly known as: FLEXERIL Take 1 tablet (5 mg total) by mouth 3 (three) times daily as needed for muscle spasms.   diclofenac sodium 1 % Gel Commonly known as: VOLTAREN Apply 2 g topically 4 (four) times daily. What changed:   when to take this  reasons to take this   hydrochlorothiazide 25 MG tablet Commonly known as: HYDRODIURIL Take 1 tablet (25 mg total) by mouth daily. What changed: when to take this   isosorbide mononitrate 60 MG 24 hr tablet Commonly known as: IMDUR Take 1 tablet (60 mg total) by mouth daily. What changed: when to take this   latanoprost 0.005 % ophthalmic solution Commonly known as: XALATAN Place 1 drop into both eyes at bedtime.   metoprolol succinate 100 MG 24 hr tablet Commonly known as: TOPROL-XL TAKE 1 TABLET (100 MG TOTAL) BY MOUTH DAILY. TAKE 1/2 TABLET DAILY What changed:   how much to take  how to take this  when to take this  additional instructions   nitroGLYCERIN 0.4 MG SL tablet Commonly known as: Nitrostat Place 1 tablet (0.4 mg total) under the tongue every 5 (five) minutes as needed for chest pain.   PARoxetine 40 MG tablet Commonly known as: PAXIL Take 1 tablet (40 mg total) by mouth daily. What changed: when to take this   traMADol 50 MG tablet Commonly known as: Ultram Take 1 tablet (50 mg total) by mouth every 4 (four) hours.            Durable Medical Equipment  (From admission, onward)         Start     Ordered   01/25/19 1326  DME Walker rolling  Once    Question:  Patient needs a walker to treat with the following condition  Answer:  Status post total left knee replacement   01/25/19 1325   01/25/19 1326  DME 3 n 1  Once     01/25/19 1325           Discharge Care Instructions  (From admission, onward)         Start     Ordered   01/28/19 0000  Weight bearing as tolerated     01/28/19 0748          Diagnostic Studies: Dg Chest 2  View  Result Date: 01/18/2019 CLINICAL DATA:  Pre-for knee.   No chest complaints. EXAM: CHEST - 2 VIEW COMPARISON:  Chest radiograph 04/01/2014 FINDINGS: Stable  cardiomediastinal contours with normal heart size. The lungs are clear. No pneumothorax or pleural effusion. No acute findings in the visualized skeleton. IMPRESSION: No acute cardiopulmonary process. Electronically Signed   By: Emmaline Kluver M.D.   On: 01/18/2019 20:05    Disposition: Discharge disposition: 01-Home or Self Care       Discharge Instructions    Call MD / Call 911   Complete by: As directed    If you experience chest pain or shortness of breath, CALL 911 and be transported to the hospital emergency room.  If you develope a fever above 101 F, pus (white drainage) or increased drainage or redness at the wound, or calf pain, call your surgeon's office.   Constipation Prevention   Complete by: As directed    Drink plenty of fluids.  Prune juice may be helpful.  You may use a stool softener, such as Colace (over the counter) 100 mg twice a day.  Use MiraLax (over the counter) for constipation as needed.   Diet - low sodium heart healthy   Complete by: As directed    Driving restrictions   Complete by: As directed    No driving for 2 weeks   Increase activity slowly as tolerated   Complete by: As directed    Patient may shower   Complete by: As directed    You may shower without a dressing once there is no drainage.  Do not wash over the wound.  If drainage remains, cover wound with plastic wrap and then shower.   Weight bearing as tolerated   Complete by: As directed       Follow-up Information    Gean Birchwood, MD. Go on 02/09/2019.   Specialty: Orthopedic Surgery Why: Your appointment is scheduled for 9:30  Contact information: [address] [place] [address] [phone]        [facility] Follow up.   Specialty: Home Health Services Why: You will be seen by HHPT for 5 visits prior to  starting Outpatient physical therapy  Contact information: [address] [place] [address] [phone]        [facility]. Go on 02/10/2019.   Why: You are scheduled to start outpatient physical therapy at 11:15. Please arrive a few minutes early to complete your paperwork   Contact information: [address] [place] [address] [phone]       Gean Birchwood, MD In 2 weeks.   Specialty: Orthopedic Surgery Contact information: [address] [place] [address] [phone]            Signed: Dannielle Burn 01/28/2019, 3:48 PM

## 2019-01-28 NOTE — Plan of Care (Signed)

## 2019-01-28 NOTE — Progress Notes (Signed)
Physical Therapy Treatment Patient Details Name: Teresa Holloway MRN: [id number] DOB: 1940/11/21 Today's Date: 01/28/2019    History of Present Illness L TKA; PMH of chronic opiod dependence, LBP, MI, HTN    PT Comments    POD # 3 pm session With Daughter in Law present for "hands on" education assisted pt OOB to amb to bathroom, amb in hallway and practice stairs.  Instructed on safe handling and instructing pt to "stay within the walker" and to have constant "hands on" assist with all mobility.  Advised pt to "don't walk by yourself".  Pt present with poor balance, poor posture and delayed ability to self correct.  HIGH FALL RISK.  Daughter in [place] plans to "be there" to assist pt at home.  Then returned to room to perform some TE's following HEP handout.  Instructed on proper tech, freq as well as use of ICE.   Addressed all mobility questions, discussed appropriate activity, educated on use of ICE.  Pt ready for D/C to home.   Follow Up Recommendations  Home health PT     Equipment Recommendations  Rolling walker with 5" wheels;3in1 (PT)    Recommendations for Other Services       Precautions / Restrictions Precautions Precautions: Knee;Fall Precaution Comments: reviewed no pillow under knee Restrictions Weight Bearing Restrictions: No Other Position/Activity Restrictions: WBAT    Mobility  Bed Mobility Overal bed mobility: Needs Assistance Bed Mobility: Supine to Sit     Supine to sit: Min guard;Min assist     General bed mobility comments: 25% VC's on proper tech and assist with assist L LE with increased time  Transfers Overall transfer level: Needs assistance Equipment used: Rolling walker (2 wheeled) Transfers: Sit to/from [employer] Sit to Stand: Min guard;Min assist Stand pivot transfers: Min assist       General transfer comment: 25% VC's on proper hand placement from "solid surface" vs pull on  walker  Ambulation/Gait Ambulation/Gait assistance: Min guard;Min assist Gait Distance (Feet): 55 Feet Assistive device: Rolling walker (2 wheeled) Gait Pattern/deviations: Step-to pattern;Decreased stride length;Antalgic;Narrow base of support Gait velocity: decreased   General Gait Details: 25% VC's on proper walker to self distance.  Min unsteady with poor posture.  easily distracted.  Cognition improved.  Balance with turns is with caution.   Stairs Stairs: Yes Stairs assistance: Min assist Stair Management: Two rails;Step to pattern;Forwards Number of Stairs: 2 General stair comments: 25% VC's on proper sequencing and safety.  Performed with daughter in law "hands on" instruction   Wheelchair Mobility    Modified Rankin (Stroke Patients Only)       Balance                                            Cognition Arousal/Alertness: Awake/alert   Overall Cognitive Status: Impaired/Different from baseline                                 General Comments: slowly improving      Exercises   Total Knee Replacement TE's 10 reps B LE ankle pumps 10 reps towel squeezes 10 reps knee presses 10 reps heel slides  10 reps SAQ's 10 reps SLR's 10 reps ABD Followed by ICE     General Comments        Pertinent  Vitals/Pain Pain Assessment: 0-10 Pain Score: 6  Pain Location: L knee Pain Descriptors / Indicators: Sore;Burning;Discomfort;Grimacing Pain Intervention(s): Monitored during session;Repositioned;Ice applied;Patient requesting pain meds-RN notified    Home Living                      Prior Function            PT Goals (current goals can now be found in the care plan section) Progress towards PT goals: Progressing toward goals    Frequency    7X/week      PT Plan Current plan remains appropriate    Co-evaluation              AM-PAC PT "6 Clicks" Mobility   Outcome Measure  Help needed turning  from your back to your side while in a flat bed without using bedrails?: A Little Help needed moving from lying on your back to sitting on the side of a flat bed without using bedrails?: A Little Help needed moving to and from a bed to a chair (including a wheelchair)?: A Little Help needed standing up from a chair using your arms (e.g., wheelchair or bedside chair)?: A Little Help needed to walk in hospital room?: A Little Help needed climbing 3-5 steps with a railing? : A Little 6 Click Score: 18    End of Session Equipment Utilized During Treatment: Gait belt Activity Tolerance: Patient tolerated treatment well Patient left: in chair;with call bell/phone within reach;with chair alarm set Nurse Communication: Mobility status(pt will need another session for "family care "education) Pain - part of body: Knee     Time: 1330-1410 PT Time Calculation (min) (ACUTE ONLY): 40 min  Charges:  $Gait Training: 8-22 mins $Therapeutic Exercise: 8-22 mins $Therapeutic Activity: 8-22 mins                     Felecia Shelling  PTA Acute  Rehabilitation Services Pager      [phone] Office      [phone]

## 2019-01-29 ENCOUNTER — Other Ambulatory Visit: Payer: Self-pay | Admitting: Nurse Practitioner

## 2019-01-29 DIAGNOSIS — F3342 Major depressive disorder, recurrent, in full remission: Secondary | ICD-10-CM

## 2019-01-29 DIAGNOSIS — S52501A Unspecified fracture of the lower end of right radius, initial encounter for closed fracture: Secondary | ICD-10-CM | POA: Diagnosis not present

## 2019-01-29 DIAGNOSIS — M25531 Pain in right wrist: Secondary | ICD-10-CM | POA: Diagnosis not present

## 2019-01-29 DIAGNOSIS — W01198A Fall on same level from slipping, tripping and stumbling with subsequent striking against other object, initial encounter: Secondary | ICD-10-CM | POA: Diagnosis not present

## 2019-01-30 DIAGNOSIS — G8929 Other chronic pain: Secondary | ICD-10-CM | POA: Diagnosis not present

## 2019-01-30 DIAGNOSIS — E785 Hyperlipidemia, unspecified: Secondary | ICD-10-CM | POA: Diagnosis not present

## 2019-01-30 DIAGNOSIS — F329 Major depressive disorder, single episode, unspecified: Secondary | ICD-10-CM | POA: Diagnosis not present

## 2019-01-30 DIAGNOSIS — M8580 Other specified disorders of bone density and structure, unspecified site: Secondary | ICD-10-CM | POA: Diagnosis not present

## 2019-01-30 DIAGNOSIS — H409 Unspecified glaucoma: Secondary | ICD-10-CM | POA: Diagnosis not present

## 2019-01-30 DIAGNOSIS — I10 Essential (primary) hypertension: Secondary | ICD-10-CM | POA: Diagnosis not present

## 2019-01-30 DIAGNOSIS — M160 Bilateral primary osteoarthritis of hip: Secondary | ICD-10-CM | POA: Diagnosis not present

## 2019-01-30 DIAGNOSIS — M80031D Age-related osteoporosis with current pathological fracture, right forearm, subsequent encounter for fracture with routine healing: Secondary | ICD-10-CM | POA: Diagnosis not present

## 2019-01-30 DIAGNOSIS — Z9181 History of falling: Secondary | ICD-10-CM | POA: Diagnosis not present

## 2019-01-30 DIAGNOSIS — M1711 Unilateral primary osteoarthritis, right knee: Secondary | ICD-10-CM | POA: Diagnosis not present

## 2019-01-30 DIAGNOSIS — Z471 Aftercare following joint replacement surgery: Secondary | ICD-10-CM | POA: Diagnosis not present

## 2019-01-30 DIAGNOSIS — F419 Anxiety disorder, unspecified: Secondary | ICD-10-CM | POA: Diagnosis not present

## 2019-01-30 DIAGNOSIS — Z96652 Presence of left artificial knee joint: Secondary | ICD-10-CM | POA: Diagnosis not present

## 2019-01-30 DIAGNOSIS — I2510 Atherosclerotic heart disease of native coronary artery without angina pectoris: Secondary | ICD-10-CM | POA: Diagnosis not present

## 2019-01-30 DIAGNOSIS — M549 Dorsalgia, unspecified: Secondary | ICD-10-CM | POA: Diagnosis not present

## 2019-02-01 DIAGNOSIS — I2510 Atherosclerotic heart disease of native coronary artery without angina pectoris: Secondary | ICD-10-CM | POA: Diagnosis not present

## 2019-02-01 DIAGNOSIS — Z471 Aftercare following joint replacement surgery: Secondary | ICD-10-CM | POA: Diagnosis not present

## 2019-02-01 DIAGNOSIS — M80031D Age-related osteoporosis with current pathological fracture, right forearm, subsequent encounter for fracture with routine healing: Secondary | ICD-10-CM | POA: Diagnosis not present

## 2019-02-01 DIAGNOSIS — I10 Essential (primary) hypertension: Secondary | ICD-10-CM | POA: Diagnosis not present

## 2019-02-01 DIAGNOSIS — F329 Major depressive disorder, single episode, unspecified: Secondary | ICD-10-CM | POA: Diagnosis not present

## 2019-02-01 DIAGNOSIS — F419 Anxiety disorder, unspecified: Secondary | ICD-10-CM | POA: Diagnosis not present

## 2019-02-01 MED ORDER — PAROXETINE HCL 40 MG PO TABS: 40.0000 mg | ORAL_TABLET | Freq: Every day | ORAL | 0 refills | Status: DC

## 2019-02-01 NOTE — Telephone Encounter (Signed)
done

## 2019-02-02 ENCOUNTER — Telehealth: Payer: Self-pay | Admitting: Nurse Practitioner

## 2019-02-02 DIAGNOSIS — Z471 Aftercare following joint replacement surgery: Secondary | ICD-10-CM | POA: Diagnosis not present

## 2019-02-02 DIAGNOSIS — I2510 Atherosclerotic heart disease of native coronary artery without angina pectoris: Secondary | ICD-10-CM | POA: Diagnosis not present

## 2019-02-02 DIAGNOSIS — S52551A Other extraarticular fracture of lower end of right radius, initial encounter for closed fracture: Secondary | ICD-10-CM | POA: Diagnosis not present

## 2019-02-02 DIAGNOSIS — I10 Essential (primary) hypertension: Secondary | ICD-10-CM | POA: Diagnosis not present

## 2019-02-02 DIAGNOSIS — M80031D Age-related osteoporosis with current pathological fracture, right forearm, subsequent encounter for fracture with routine healing: Secondary | ICD-10-CM | POA: Diagnosis not present

## 2019-02-02 DIAGNOSIS — F329 Major depressive disorder, single episode, unspecified: Secondary | ICD-10-CM | POA: Diagnosis not present

## 2019-02-02 DIAGNOSIS — F419 Anxiety disorder, unspecified: Secondary | ICD-10-CM | POA: Diagnosis not present

## 2019-02-02 NOTE — Telephone Encounter (Signed)
Patients son notified. He states that he will try these and let us know if it doesn't work

## 2019-02-02 NOTE — Telephone Encounter (Signed)
She can try melatonin or benadryl as needed. If she needs medication she needs to be scheduled a telephone visit and would prefer her to wait to talk to PCP if possible.

## 2019-02-02 NOTE — Telephone Encounter (Signed)
Covering PCP- please advise  

## 2019-02-04 DIAGNOSIS — I10 Essential (primary) hypertension: Secondary | ICD-10-CM | POA: Diagnosis not present

## 2019-02-04 DIAGNOSIS — M80031D Age-related osteoporosis with current pathological fracture, right forearm, subsequent encounter for fracture with routine healing: Secondary | ICD-10-CM | POA: Diagnosis not present

## 2019-02-04 DIAGNOSIS — F419 Anxiety disorder, unspecified: Secondary | ICD-10-CM | POA: Diagnosis not present

## 2019-02-04 DIAGNOSIS — F329 Major depressive disorder, single episode, unspecified: Secondary | ICD-10-CM | POA: Diagnosis not present

## 2019-02-04 DIAGNOSIS — I2510 Atherosclerotic heart disease of native coronary artery without angina pectoris: Secondary | ICD-10-CM | POA: Diagnosis not present

## 2019-02-04 DIAGNOSIS — Z471 Aftercare following joint replacement surgery: Secondary | ICD-10-CM | POA: Diagnosis not present

## 2019-02-05 DIAGNOSIS — M80031D Age-related osteoporosis with current pathological fracture, right forearm, subsequent encounter for fracture with routine healing: Secondary | ICD-10-CM | POA: Diagnosis not present

## 2019-02-05 DIAGNOSIS — Z471 Aftercare following joint replacement surgery: Secondary | ICD-10-CM | POA: Diagnosis not present

## 2019-02-05 DIAGNOSIS — F419 Anxiety disorder, unspecified: Secondary | ICD-10-CM | POA: Diagnosis not present

## 2019-02-05 DIAGNOSIS — F329 Major depressive disorder, single episode, unspecified: Secondary | ICD-10-CM | POA: Diagnosis not present

## 2019-02-05 DIAGNOSIS — I10 Essential (primary) hypertension: Secondary | ICD-10-CM | POA: Diagnosis not present

## 2019-02-05 DIAGNOSIS — I2510 Atherosclerotic heart disease of native coronary artery without angina pectoris: Secondary | ICD-10-CM | POA: Diagnosis not present

## 2019-02-08 ENCOUNTER — Telehealth: Payer: Self-pay | Admitting: Nurse Practitioner

## 2019-02-08 DIAGNOSIS — F329 Major depressive disorder, single episode, unspecified: Secondary | ICD-10-CM | POA: Diagnosis not present

## 2019-02-08 DIAGNOSIS — I2510 Atherosclerotic heart disease of native coronary artery without angina pectoris: Secondary | ICD-10-CM | POA: Diagnosis not present

## 2019-02-08 DIAGNOSIS — F5101 Primary insomnia: Secondary | ICD-10-CM

## 2019-02-08 DIAGNOSIS — I10 Essential (primary) hypertension: Secondary | ICD-10-CM | POA: Diagnosis not present

## 2019-02-08 DIAGNOSIS — F419 Anxiety disorder, unspecified: Secondary | ICD-10-CM | POA: Diagnosis not present

## 2019-02-08 DIAGNOSIS — Z471 Aftercare following joint replacement surgery: Secondary | ICD-10-CM | POA: Diagnosis not present

## 2019-02-08 DIAGNOSIS — M80031D Age-related osteoporosis with current pathological fracture, right forearm, subsequent encounter for fracture with routine healing: Secondary | ICD-10-CM | POA: Diagnosis not present

## 2019-02-08 MED ORDER — TRAZODONE HCL 50 MG PO TABS: 25.0000 mg | ORAL_TABLET | Freq: Every evening | ORAL | 3 refills | Status: DC | PRN

## 2019-02-08 NOTE — Telephone Encounter (Signed)
Is she taking a whole trazadone at night?

## 2019-02-08 NOTE — Telephone Encounter (Signed)
Patients son states that she is still not sleeping. See previous telephone note that Alyse Low responded to while you were out. Should she come in a be evaluated?

## 2019-02-09 DIAGNOSIS — S52551P Other extraarticular fracture of lower end of right radius, subsequent encounter for closed fracture with malunion: Secondary | ICD-10-CM | POA: Diagnosis not present

## 2019-02-09 DIAGNOSIS — Z471 Aftercare following joint replacement surgery: Secondary | ICD-10-CM | POA: Diagnosis not present

## 2019-02-09 DIAGNOSIS — Z96652 Presence of left artificial knee joint: Secondary | ICD-10-CM | POA: Diagnosis not present

## 2019-02-10 ENCOUNTER — Ambulatory Visit: Payer: Medicare Other | Admitting: Physical Therapy

## 2019-02-11 DIAGNOSIS — I10 Essential (primary) hypertension: Secondary | ICD-10-CM | POA: Diagnosis not present

## 2019-02-11 DIAGNOSIS — I2510 Atherosclerotic heart disease of native coronary artery without angina pectoris: Secondary | ICD-10-CM | POA: Diagnosis not present

## 2019-02-11 DIAGNOSIS — M80031D Age-related osteoporosis with current pathological fracture, right forearm, subsequent encounter for fracture with routine healing: Secondary | ICD-10-CM | POA: Diagnosis not present

## 2019-02-11 DIAGNOSIS — F419 Anxiety disorder, unspecified: Secondary | ICD-10-CM | POA: Diagnosis not present

## 2019-02-11 DIAGNOSIS — F329 Major depressive disorder, single episode, unspecified: Secondary | ICD-10-CM | POA: Diagnosis not present

## 2019-02-11 DIAGNOSIS — Z471 Aftercare following joint replacement surgery: Secondary | ICD-10-CM | POA: Diagnosis not present

## 2019-02-17 DIAGNOSIS — F419 Anxiety disorder, unspecified: Secondary | ICD-10-CM | POA: Diagnosis not present

## 2019-02-17 DIAGNOSIS — F329 Major depressive disorder, single episode, unspecified: Secondary | ICD-10-CM | POA: Diagnosis not present

## 2019-02-17 DIAGNOSIS — I2510 Atherosclerotic heart disease of native coronary artery without angina pectoris: Secondary | ICD-10-CM | POA: Diagnosis not present

## 2019-02-17 DIAGNOSIS — I10 Essential (primary) hypertension: Secondary | ICD-10-CM | POA: Diagnosis not present

## 2019-02-17 DIAGNOSIS — M80031D Age-related osteoporosis with current pathological fracture, right forearm, subsequent encounter for fracture with routine healing: Secondary | ICD-10-CM | POA: Diagnosis not present

## 2019-02-17 DIAGNOSIS — Z471 Aftercare following joint replacement surgery: Secondary | ICD-10-CM | POA: Diagnosis not present

## 2019-02-19 DIAGNOSIS — F419 Anxiety disorder, unspecified: Secondary | ICD-10-CM | POA: Diagnosis not present

## 2019-02-19 DIAGNOSIS — F329 Major depressive disorder, single episode, unspecified: Secondary | ICD-10-CM | POA: Diagnosis not present

## 2019-02-19 DIAGNOSIS — I10 Essential (primary) hypertension: Secondary | ICD-10-CM | POA: Diagnosis not present

## 2019-02-19 DIAGNOSIS — I2510 Atherosclerotic heart disease of native coronary artery without angina pectoris: Secondary | ICD-10-CM | POA: Diagnosis not present

## 2019-02-19 DIAGNOSIS — M80031D Age-related osteoporosis with current pathological fracture, right forearm, subsequent encounter for fracture with routine healing: Secondary | ICD-10-CM | POA: Diagnosis not present

## 2019-02-19 DIAGNOSIS — Z471 Aftercare following joint replacement surgery: Secondary | ICD-10-CM | POA: Diagnosis not present

## 2019-02-22 DIAGNOSIS — I2510 Atherosclerotic heart disease of native coronary artery without angina pectoris: Secondary | ICD-10-CM | POA: Diagnosis not present

## 2019-02-22 DIAGNOSIS — F329 Major depressive disorder, single episode, unspecified: Secondary | ICD-10-CM | POA: Diagnosis not present

## 2019-02-22 DIAGNOSIS — F419 Anxiety disorder, unspecified: Secondary | ICD-10-CM | POA: Diagnosis not present

## 2019-02-22 DIAGNOSIS — M80031D Age-related osteoporosis with current pathological fracture, right forearm, subsequent encounter for fracture with routine healing: Secondary | ICD-10-CM | POA: Diagnosis not present

## 2019-02-22 DIAGNOSIS — I10 Essential (primary) hypertension: Secondary | ICD-10-CM | POA: Diagnosis not present

## 2019-02-22 DIAGNOSIS — Z471 Aftercare following joint replacement surgery: Secondary | ICD-10-CM | POA: Diagnosis not present

## 2019-02-25 DIAGNOSIS — F419 Anxiety disorder, unspecified: Secondary | ICD-10-CM | POA: Diagnosis not present

## 2019-02-25 DIAGNOSIS — F329 Major depressive disorder, single episode, unspecified: Secondary | ICD-10-CM | POA: Diagnosis not present

## 2019-02-25 DIAGNOSIS — I10 Essential (primary) hypertension: Secondary | ICD-10-CM | POA: Diagnosis not present

## 2019-02-25 DIAGNOSIS — M80031D Age-related osteoporosis with current pathological fracture, right forearm, subsequent encounter for fracture with routine healing: Secondary | ICD-10-CM | POA: Diagnosis not present

## 2019-02-25 DIAGNOSIS — Z471 Aftercare following joint replacement surgery: Secondary | ICD-10-CM | POA: Diagnosis not present

## 2019-02-25 DIAGNOSIS — I2510 Atherosclerotic heart disease of native coronary artery without angina pectoris: Secondary | ICD-10-CM | POA: Diagnosis not present

## 2019-03-04 ENCOUNTER — Other Ambulatory Visit: Payer: Self-pay | Admitting: Nurse Practitioner

## 2019-03-04 DIAGNOSIS — F5101 Primary insomnia: Secondary | ICD-10-CM

## 2019-03-09 DIAGNOSIS — S52551P Other extraarticular fracture of lower end of right radius, subsequent encounter for closed fracture with malunion: Secondary | ICD-10-CM | POA: Diagnosis not present

## 2019-03-12 ENCOUNTER — Encounter: Payer: Self-pay | Admitting: Nurse Practitioner

## 2019-03-12 ENCOUNTER — Ambulatory Visit (INDEPENDENT_AMBULATORY_CARE_PROVIDER_SITE_OTHER): Payer: Medicare Other | Admitting: Nurse Practitioner

## 2019-03-12 DIAGNOSIS — M170 Bilateral primary osteoarthritis of knee: Secondary | ICD-10-CM | POA: Diagnosis not present

## 2019-03-12 DIAGNOSIS — I10 Essential (primary) hypertension: Secondary | ICD-10-CM | POA: Diagnosis not present

## 2019-03-12 DIAGNOSIS — K219 Gastro-esophageal reflux disease without esophagitis: Secondary | ICD-10-CM | POA: Diagnosis not present

## 2019-03-12 DIAGNOSIS — Z6830 Body mass index (BMI) 30.0-30.9, adult: Secondary | ICD-10-CM | POA: Diagnosis not present

## 2019-03-12 DIAGNOSIS — F5101 Primary insomnia: Secondary | ICD-10-CM

## 2019-03-12 DIAGNOSIS — F3342 Major depressive disorder, recurrent, in full remission: Secondary | ICD-10-CM | POA: Diagnosis not present

## 2019-03-12 DIAGNOSIS — E785 Hyperlipidemia, unspecified: Secondary | ICD-10-CM | POA: Diagnosis not present

## 2019-03-12 DIAGNOSIS — M810 Age-related osteoporosis without current pathological fracture: Secondary | ICD-10-CM

## 2019-03-12 DIAGNOSIS — I25111 Atherosclerotic heart disease of native coronary artery with angina pectoris with documented spasm: Secondary | ICD-10-CM

## 2019-03-12 MED ORDER — ALENDRONATE SODIUM 70 MG PO TABS: ORAL_TABLET | ORAL | 0 refills | Status: DC

## 2019-03-12 MED ORDER — TRAZODONE HCL 50 MG PO TABS: 25.0000 mg | ORAL_TABLET | Freq: Every evening | ORAL | 1 refills | Status: DC | PRN

## 2019-03-12 MED ORDER — PAROXETINE HCL 40 MG PO TABS: 40.0000 mg | ORAL_TABLET | Freq: Every day | ORAL | 1 refills | Status: DC

## 2019-03-12 NOTE — Progress Notes (Signed)
Virtual Visit via telephone Note Due to COVID-19 pandemic this visit was conducted virtually. This visit type was conducted due to national recommendations for restrictions regarding the COVID-19 Pandemic (e.g. social distancing, sheltering in place) in an effort to limit this patient's exposure and mitigate transmission in our community. All issues noted in this document were discussed and addressed.  A physical exam was not performed with this format.  I connected with Teresa Holloway on 03/12/19 at 9:40 by telephone and verified that I am speaking with the correct person using two identifiers. Teresa Holloway is currently located at home and no one is currently with him during visit. The provider, Mary-Margaret Hassell Done, FNP is located in their office at time of visit.  I discussed the limitations, risks, security and privacy concerns of performing an evaluation and management service by telephone and the availability of in person appointments. I also discussed with the patient that there may be a patient responsible charge related to this service. The patient expressed understanding and agreed to proceed.   History and Present Illness:   Chief Complaint: Medical Management of Chronic Issues    HPI:  1. Essential hypertension, benign No c/o chest pain, sob or headache. Does not check blood pressure at home. BP Readings from Last 3 Encounters:  01/28/19 (!) 123/58  01/18/19 116/60  01/07/19 126/69     2. Hyperlipidemia with target LDL less than 100 Does not watch diet and does not do much exercise due to knee pain. Lab Results  Component Value Date   CHOL 125 12/10/2018   HDL 62 12/10/2018   LDLCALC 42 12/10/2018   TRIG 104 12/10/2018   CHOLHDL 2.0 12/10/2018     3. Coronary artery disease involving native coronary artery of native heart with angina pectoris with documented spasm [facility]) Had telephone visit with cardiology on 12/17/18. According to note no changes were made  to plan of care.  4. Gastroesophageal reflux disease without esophagitis Is currently dong well and is on no rx meds  5. Arthritis of both knees Had total knee replacement on left on 01/25/19. She felll after she came home and broke her right arm. She is doing PT.  6. Recurrent major depressive disorder, in full remission [facility]) She is on paxil daily and is doing well. Depression screen [facility] 2/9 03/12/2019 01/07/2019 12/10/2018  Decreased Interest 0 0 0  Down, Depressed, Hopeless 1 0 0  PHQ - 2 Score 1 0 0     7. BMI 30.0-30.9,adult No recent weight changes Wt Readings from Last 3 Encounters:  01/25/19 163 lb (73.9 kg)  01/18/19 161 lb 1 oz (73.1 kg)  01/07/19 164 lb (74.4 kg)   BMI Readings from Last 3 Encounters:  01/25/19 27.98 kg/m  01/18/19 24.49 kg/m  01/07/19 28.15 kg/m    8. Age related osteoporosis, unspecified pathological fracture presence Not able to do weigfht bearing exercises    Outpatient Encounter Medications as of 03/12/2019  Medication Sig  . alendronate (FOSAMAX) 70 MG tablet TAKE 1 TABLE BY MOUTH EVERY 7 DAYS. TAKE WITH A FULL GLASS OF WATER ON AN EMPTY STOMACH. (Patient not taking: Reported on 01/14/2019)  . amLODipine (NORVASC) 10 MG tablet Take 1 tablet (10 mg total) by mouth daily. (Patient taking differently: Take 10 mg by mouth at bedtime. )  . aspirin EC 81 MG tablet Take 1 tablet (81 mg total) by mouth 2 (two) times daily.  Marland Kitchen atorvastatin (LIPITOR) 40 MG tablet Take 1 tablet (40  mg total) by mouth daily at 6 PM.  . benazepril (LOTENSIN) 40 MG tablet Take 1 tablet (40 mg total) by mouth daily. (Patient taking differently: Take 40 mg by mouth at bedtime. )  . cholecalciferol (VITAMIN D) 1000 UNITS tablet Take 1,000 Units by mouth daily.  . clopidogrel (PLAVIX) 75 MG tablet Take 1 tablet (75 mg total) by mouth daily. (Patient taking differently: Take 75 mg by mouth at bedtime. )  . cyclobenzaprine (FLEXERIL) 5 MG tablet Take 1 tablet (5 mg total) by  mouth 3 (three) times daily as needed for muscle spasms.  . diclofenac sodium (VOLTAREN) 1 % GEL Apply 2 g topically 4 (four) times daily. (Patient taking differently: Apply 2 g topically 4 (four) times daily as needed (pain). )  . hydrochlorothiazide (HYDRODIURIL) 25 MG tablet Take 1 tablet (25 mg total) by mouth daily. (Patient taking differently: Take 25 mg by mouth at bedtime. )  . isosorbide mononitrate (IMDUR) 60 MG 24 hr tablet Take 1 tablet (60 mg total) by mouth daily. (Patient taking differently: Take 60 mg by mouth at bedtime. )  . latanoprost (XALATAN) 0.005 % ophthalmic solution Place 1 drop into both eyes at bedtime.  . metoprolol succinate (TOPROL-XL) 100 MG 24 hr tablet TAKE 1 TABLET (100 MG TOTAL) BY MOUTH DAILY. TAKE 1/2 TABLET DAILY (Patient taking differently: Take 50 mg by mouth at bedtime. )  . nitroGLYCERIN (NITROSTAT) 0.4 MG SL tablet Place 1 tablet (0.4 mg total) under the tongue every 5 (five) minutes as needed for chest pain.  Marland Kitchen PARoxetine (PAXIL) 40 MG tablet Take 1 tablet (40 mg total) by mouth at bedtime.  . traMADol (ULTRAM) 50 MG tablet Take 1 tablet (50 mg total) by mouth every 4 (four) hours.  . traZODone (DESYREL) 50 MG tablet TAKE 0.5-1 TABLETS (25-50 MG TOTAL) BY MOUTH AT BEDTIME AS NEEDED FOR SLEEP.    Past Surgical History:  Procedure Laterality Date  . ECTOPIC PREGNANCY SURGERY    . left breast biospy    . left wrist fracture    . TOTAL KNEE ARTHROPLASTY Left 01/25/2019   Procedure: LEFT TOTAL KNEE ARTHROPLASTY;  Surgeon: Frederik Pear, MD;  Location: [facility];  Service: Orthopedics;  Laterality: Left;    Family History  Problem Relation Age of Onset  . Asthma Father   . Diabetes Sister   . Diabetes Brother     New complaints: None today  Social history: Lives by herself  Controlled substance contract: n/a    Review of Systems  Constitutional: Negative for diaphoresis and weight loss.  Eyes: Negative for blurred vision, double vision and  pain.  Respiratory: Negative for shortness of breath.   Cardiovascular: Negative for chest pain, palpitations, orthopnea and leg swelling.  Gastrointestinal: Negative for abdominal pain.  Skin: Negative for rash.  Neurological: Negative for dizziness, sensory change, loss of consciousness, weakness and headaches.  Endo/Heme/Allergies: Negative for polydipsia. Does not bruise/bleed easily.  Psychiatric/Behavioral: Negative for memory loss. The patient does not have insomnia.   All other systems reviewed and are negative.    Observations/Objective: Alert and oriented- answers all questions appropriately No distress    Assessment and Plan: AZYLA KLEBAN comes in today with chief complaint of Medical Management of Chronic Issues   Diagnosis and orders addressed:  1. Essential hypertension, benign Low sodium diet  2. Hyperlipidemia with target LDL less than 100 Low fat diet  3. Coronary artery disease involving native coronary artery of native heart with angina pectoris with  documented spasm ([place]) Keep follow up with cardiology in 6 months  4. Gastroesophageal reflux disease without esophagitis Avoid spicy foods Do not eat 2 hours prior to bedtime  5. Arthritis of both knees Right knee is not bothering her. She says hr pain has decreased since surery. Going to try to go without pain meds for now  6. Recurrent major depressive disorder, in full remission ([place]) continue paxil Stress management   7. BMI 30.0-30.9,adult Discussed diet and exercise for person with BMI >25 Will recheck weight in 3-6 months   8. Age related osteoporosis, unspecified pathological fracture presence Weight bearing exercises as can tolerate post knee surgery   Health Maintenance reviewed Diet and exercise encouraged  Follow up plan: 3 month     I discussed the assessment and treatment plan with the patient. The patient was provided an opportunity to ask questions and all were  answered. The patient agreed with the plan and demonstrated an understanding of the instructions.   The patient was advised to call back or seek an in-person evaluation if the symptoms worsen or if the condition fails to improve as anticipated.  The above assessment and management plan was discussed with the patient. The patient verbalized understanding of and has agreed to the management plan. Patient is aware to call the clinic if symptoms persist or worsen. Patient is aware when to return to the clinic for a follow-up visit. Patient educated on when it is appropriate to go to the emergency department.   Time call ended:  10:10 I provided 30 minutes of non-face-to-face time during this encounter.    Mary-Margaret Hassell Done, FNP

## 2019-03-24 ENCOUNTER — Other Ambulatory Visit: Payer: Self-pay | Admitting: Nurse Practitioner

## 2019-03-24 ENCOUNTER — Encounter: Payer: Self-pay | Admitting: Physician Assistant

## 2019-03-24 ENCOUNTER — Other Ambulatory Visit: Payer: Self-pay

## 2019-03-24 ENCOUNTER — Ambulatory Visit (INDEPENDENT_AMBULATORY_CARE_PROVIDER_SITE_OTHER): Payer: Medicare Other | Admitting: Physician Assistant

## 2019-03-24 DIAGNOSIS — N3000 Acute cystitis without hematuria: Secondary | ICD-10-CM | POA: Diagnosis not present

## 2019-03-24 DIAGNOSIS — I10 Essential (primary) hypertension: Secondary | ICD-10-CM

## 2019-03-24 MED ORDER — CIPROFLOXACIN HCL 500 MG PO TABS: 500.0000 mg | ORAL_TABLET | Freq: Two times a day (BID) | ORAL | 0 refills | Status: DC

## 2019-03-24 NOTE — Progress Notes (Signed)
Telephone visit  Subjective: CC:uti PCP: Chevis Pretty, FNP OZ:[id number] A Teresa Holloway is a 78 y.o. female calls for telephone consult today. Patient provides verbal consent for consult held via phone.  Patient is identified with 2 separate identifiers.  At this time the entire area is on COVID-19 social distancing and stay home orders are in place.  Patient is of higher risk and therefore we are performing this by a virtual method.  Location of patient: home Location of provider: HOME Others present for call: no   This patient has had been for 7 days of dysuria, frequency and nocturia. There is also pain over the bladder in the suprapubic region, no back pain. Denies leakage or hematuria.  Denies fever or chills. No pain in flank area. In reviewing her chart she is able to take ciprofloxacin.  A prescription will be sent to her pharmacy now encouraged her to increase her fluids as much as possible. She has had urinary tract infections in the past.   ROS: Per HPI  Allergies  Allergen Reactions  . Atarax [Hydroxyzine] Nausea And Vomiting  . Bactrim [Sulfamethoxazole-Trimethoprim] Nausea Only  . Coreg [Carvedilol] Other (See Comments)    Unknown   Past Medical History:  Diagnosis Date  . Anxiety   . Arthritis    Knees, hips  . CAD (coronary artery disease)   . Chronic back pain   . Depression   . GERD (gastroesophageal reflux disease)   . Hyperlipidemia   . Hypertension   . MI (myocardial infarction) ([place])   . Osteopenia     Current Outpatient Medications:  .  alendronate (FOSAMAX) 70 MG tablet, TAKE 1 TABLE BY MOUTH EVERY 7 DAYS. TAKE WITH A FULL GLASS OF WATER ON AN EMPTY STOMACH., Disp: 12 tablet, Rfl: 0 .  amLODipine (NORVASC) 10 MG tablet, Take 1 tablet (10 mg total) by mouth daily. (Patient taking differently: Take 10 mg by mouth at bedtime. ), Disp: 90 tablet, Rfl: 1 .  aspirin EC 81 MG tablet, Take 1 tablet (81 mg total) by mouth 2 (two) times daily.,  Disp: 60 tablet, Rfl: 0 .  atorvastatin (LIPITOR) 40 MG tablet, Take 1 tablet (40 mg total) by mouth daily at 6 PM., Disp: 90 tablet, Rfl: 1 .  benazepril (LOTENSIN) 40 MG tablet, Take 1 tablet (40 mg total) by mouth daily. (Patient taking differently: Take 40 mg by mouth at bedtime. ), Disp: 90 tablet, Rfl: 1 .  cholecalciferol (VITAMIN D) 1000 UNITS tablet, Take 1,000 Units by mouth daily., Disp: , Rfl:  .  ciprofloxacin (CIPRO) 500 MG tablet, Take 1 tablet (500 mg total) by mouth 2 (two) times daily., Disp: 14 tablet, Rfl: 0 .  clopidogrel (PLAVIX) 75 MG tablet, Take 1 tablet (75 mg total) by mouth daily. (Patient taking differently: Take 75 mg by mouth at bedtime. ), Disp: 90 tablet, Rfl: 1 .  cyclobenzaprine (FLEXERIL) 5 MG tablet, Take 1 tablet (5 mg total) by mouth 3 (three) times daily as needed for muscle spasms., Disp: 60 tablet, Rfl: 0 .  diclofenac sodium (VOLTAREN) 1 % GEL, Apply 2 g topically 4 (four) times daily. (Patient taking differently: Apply 2 g topically 4 (four) times daily as needed (pain). ), Disp: 500 g, Rfl: 1 .  hydrochlorothiazide (HYDRODIURIL) 25 MG tablet, Take 1 tablet (25 mg total) by mouth daily. (Patient taking differently: Take 25 mg by mouth at bedtime. ), Disp: 90 tablet, Rfl: 1 .  isosorbide mononitrate (IMDUR) 60 MG 24 hr  tablet, Take 1 tablet (60 mg total) by mouth daily. (Patient taking differently: Take 60 mg by mouth at bedtime. ), Disp: 90 tablet, Rfl: 1 .  latanoprost (XALATAN) 0.005 % ophthalmic solution, Place 1 drop into both eyes at bedtime., Disp: , Rfl:  .  metoprolol succinate (TOPROL-XL) 100 MG 24 hr tablet, TAKE 1 TABLET (100 MG TOTAL) BY MOUTH DAILY. TAKE 1/2 TABLET DAILY (Patient taking differently: Take 50 mg by mouth at bedtime. ), Disp: 90 tablet, Rfl: 1 .  nitroGLYCERIN (NITROSTAT) 0.4 MG SL tablet, Place 1 tablet (0.4 mg total) under the tongue every 5 (five) minutes as needed for chest pain., Disp: 30 tablet, Rfl: 1 .  PARoxetine (PAXIL) 40  MG tablet, Take 1 tablet (40 mg total) by mouth at bedtime., Disp: 90 tablet, Rfl: 1 .  traMADol (ULTRAM) 50 MG tablet, Take 1 tablet (50 mg total) by mouth every 4 (four) hours., Disp: 30 tablet, Rfl: 0 .  traZODone (DESYREL) 50 MG tablet, Take 0.5-1 tablets (25-50 mg total) by mouth at bedtime as needed for sleep., Disp: 90 tablet, Rfl: 1  Assessment/ Plan: 78 y.o. female   1. Acute cystitis without hematuria - ciprofloxacin (CIPRO) 500 MG tablet; Take 1 tablet (500 mg total) by mouth 2 (two) times daily.  Dispense: 14 tablet; Refill: 0   No follow-ups on file.  Continue all other maintenance medications as listed above.  Start time: 3:55 PM End time: 4:03 PM  Meds ordered this encounter  Medications  . ciprofloxacin (CIPRO) 500 MG tablet    Sig: Take 1 tablet (500 mg total) by mouth 2 (two) times daily.    Dispense:  14 tablet    Refill:  0    Order Specific Question:   Supervising Provider    Answer:   Janora Norlander [id number]    Particia Nearing PA-C [place] [phone]

## 2019-03-30 ENCOUNTER — Other Ambulatory Visit: Payer: Self-pay | Admitting: Nurse Practitioner

## 2019-03-30 NOTE — Telephone Encounter (Signed)
Pt says she just had a visit with MMM and she told her if she ever needed it to call. I advised pt per office policy/protocol pt must have a visit to discuss. Pt voiced understanding and given appt with MMM 12/14 at 10:15.

## 2019-04-05 ENCOUNTER — Ambulatory Visit (INDEPENDENT_AMBULATORY_CARE_PROVIDER_SITE_OTHER): Payer: Medicare Other | Admitting: Nurse Practitioner

## 2019-04-05 ENCOUNTER — Encounter: Payer: Self-pay | Admitting: Nurse Practitioner

## 2019-04-05 DIAGNOSIS — M25551 Pain in right hip: Secondary | ICD-10-CM | POA: Diagnosis not present

## 2019-04-05 DIAGNOSIS — I25111 Atherosclerotic heart disease of native coronary artery with angina pectoris with documented spasm: Secondary | ICD-10-CM

## 2019-04-05 MED ORDER — TRAMADOL HCL 50 MG PO TABS: 50.0000 mg | ORAL_TABLET | ORAL | 1 refills | Status: DC

## 2019-04-05 NOTE — Progress Notes (Signed)
   Virtual Visit via telephone Note Due to COVID-19 pandemic this visit was conducted virtually. This visit type was conducted due to national recommendations for restrictions regarding the COVID-19 Pandemic (e.g. social distancing, sheltering in place) in an effort to limit this patient's exposure and mitigate transmission in our community. All issues noted in this document were discussed and addressed.  A physical exam was not performed with this format.  I connected with Teresa Holloway on 04/05/19 at 9:00 by telephone and verified that I am speaking with the correct person using two identifiers. Teresa Holloway is currently located at home and no one is currently with her during visit. The provider, Mary-Margaret Hassell Done, FNP is located in their office at time of visit.  I discussed the limitations, risks, security and privacy concerns of performing an evaluation and management service by telephone and the availability of in person appointments. I also discussed with the patient that there may be a patient responsible charge related to this service. The patient expressed understanding and agreed to proceed.   History and Present Illness:   Chief Complaint: Hip Pain   HPI patient calls in c/o right hip pain. She just recently had left total knee replacement and hip has been hurting more since surgery. Rates pain 7-8/10. Walking increases pan. And sitting decreases pain. She use to be on tramadol which helped with pain   Review of Systems  Constitutional: Negative for diaphoresis and weight loss.  Eyes: Negative for blurred vision, double vision and pain.  Respiratory: Negative for shortness of breath.   Cardiovascular: Negative for chest pain, palpitations, orthopnea and leg swelling.  Gastrointestinal: Negative for abdominal pain.  Musculoskeletal: Positive for joint pain (right hi().  Skin: Negative for rash.  Neurological: Negative for dizziness, sensory change, loss of  consciousness, weakness and headaches.  Endo/Heme/Allergies: Negative for polydipsia. Does not bruise/bleed easily.  Psychiatric/Behavioral: Negative for memory loss. The patient does not have insomnia.   All other systems reviewed and are negative.    Observations/Objective: Alert and oriented- answers all questions appropriately No distress    Assessment and Plan: Teresa Holloway in today with chief complaint of Hip Pain   1. Right hip pain Only use when necessary Will need to follow up with surgeon soon - traMADol (ULTRAM) 50 MG tablet; Take 1 tablet (50 mg total) by mouth every 4 (four) hours.  Dispense: 30 tablet; Refill: 1   Follow Up Instructions: prn    I discussed the assessment and treatment plan with the patient. The patient was provided an opportunity to ask questions and all were answered. The patient agreed with the plan and demonstrated an understanding of the instructions.   The patient was advised to call back or seek an in-person evaluation if the symptoms worsen or if the condition fails to improve as anticipated.  The above assessment and management plan was discussed with the patient. The patient verbalized understanding of and has agreed to the management plan. Patient is aware to call the clinic if symptoms persist or worsen. Patient is aware when to return to the clinic for a follow-up visit. Patient educated on when it is appropriate to go to the emergency department.   Time call ended:  9:18  I provided 18 minutes of non-face-to-face time during this encounter.    Mary-Margaret Hassell Done, FNP

## 2019-04-26 ENCOUNTER — Other Ambulatory Visit: Payer: Self-pay | Admitting: Nurse Practitioner

## 2019-04-26 DIAGNOSIS — I25111 Atherosclerotic heart disease of native coronary artery with angina pectoris with documented spasm: Secondary | ICD-10-CM

## 2019-06-14 ENCOUNTER — Other Ambulatory Visit: Payer: Self-pay

## 2019-06-15 ENCOUNTER — Encounter: Payer: Self-pay | Admitting: Nurse Practitioner

## 2019-06-15 ENCOUNTER — Telehealth: Payer: Self-pay | Admitting: Nurse Practitioner

## 2019-06-15 ENCOUNTER — Ambulatory Visit (INDEPENDENT_AMBULATORY_CARE_PROVIDER_SITE_OTHER): Payer: Medicare Other | Admitting: Nurse Practitioner

## 2019-06-15 VITALS — BP 103/56 | HR 55 | Temp 97.3°F | Resp 20 | Ht 64.0 in | Wt 154.0 lb

## 2019-06-15 DIAGNOSIS — M1611 Unilateral primary osteoarthritis, right hip: Secondary | ICD-10-CM

## 2019-06-15 DIAGNOSIS — I25111 Atherosclerotic heart disease of native coronary artery with angina pectoris with documented spasm: Secondary | ICD-10-CM | POA: Diagnosis not present

## 2019-06-15 DIAGNOSIS — Z6830 Body mass index (BMI) 30.0-30.9, adult: Secondary | ICD-10-CM | POA: Diagnosis not present

## 2019-06-15 DIAGNOSIS — E785 Hyperlipidemia, unspecified: Secondary | ICD-10-CM | POA: Diagnosis not present

## 2019-06-15 DIAGNOSIS — R350 Frequency of micturition: Secondary | ICD-10-CM | POA: Diagnosis not present

## 2019-06-15 DIAGNOSIS — F5101 Primary insomnia: Secondary | ICD-10-CM

## 2019-06-15 DIAGNOSIS — M25551 Pain in right hip: Secondary | ICD-10-CM

## 2019-06-15 DIAGNOSIS — I10 Essential (primary) hypertension: Secondary | ICD-10-CM

## 2019-06-15 DIAGNOSIS — F3342 Major depressive disorder, recurrent, in full remission: Secondary | ICD-10-CM | POA: Diagnosis not present

## 2019-06-15 DIAGNOSIS — K219 Gastro-esophageal reflux disease without esophagitis: Secondary | ICD-10-CM | POA: Diagnosis not present

## 2019-06-15 LAB — CMP14+EGFR
ALT: 8 IU/L (ref 0–32)
AST: 12 IU/L (ref 0–40)
Albumin/Globulin Ratio: 2.1 (ref 1.2–2.2)
Albumin: 4.6 g/dL (ref 3.7–4.7)
Alkaline Phosphatase: 108 IU/L (ref 39–117)
BUN/Creatinine Ratio: 16 (ref 12–28)
BUN: 16 mg/dL (ref 8–27)
Bilirubin Total: 0.7 mg/dL (ref 0.0–1.2)
CO2: 24 mmol/L (ref 20–29)
Calcium: 9.9 mg/dL (ref 8.7–10.3)
Chloride: 102 mmol/L (ref 96–106)
Creatinine, Ser: 1 mg/dL (ref 0.57–1.00)
GFR calc Af Amer: 63 mL/min/{1.73_m2} (ref >59)
GFR calc non Af Amer: 54 mL/min/{1.73_m2} — ABNORMAL LOW (ref >59)
Globulin, Total: 2.2 g/dL (ref 1.5–4.5)
Glucose: 86 mg/dL (ref 65–99)
Potassium: 4 mmol/L (ref 3.5–5.2)
Sodium: 142 mmol/L (ref 134–144)
Total Protein: 6.8 g/dL (ref 6.0–8.5)

## 2019-06-15 LAB — LIPID PANEL
Chol/HDL Ratio: 1.8 ratio (ref 0.0–4.4)
Cholesterol, Total: 126 mg/dL (ref 100–199)
HDL: 69 mg/dL (ref >39)
LDL Chol Calc (NIH): 41 mg/dL (ref 0–99)
Triglycerides: 79 mg/dL (ref 0–149)
VLDL Cholesterol Cal: 16 mg/dL (ref 5–40)

## 2019-06-15 LAB — CBC WITH DIFFERENTIAL/PLATELET
Basophils Absolute: 0.1 10*3/uL (ref 0.0–0.2)
Basos: 1 %
EOS (ABSOLUTE): 0.1 10*3/uL (ref 0.0–0.4)
Eos: 2 %
Hematocrit: 36.2 % (ref 34.0–46.6)
Hemoglobin: 11.6 g/dL (ref 11.1–15.9)
Immature Grans (Abs): 0 10*3/uL (ref 0.0–0.1)
Immature Granulocytes: 0 %
Lymphocytes Absolute: 1.7 10*3/uL (ref 0.7–3.1)
Lymphs: 26 %
MCH: 29.1 pg (ref 26.6–33.0)
MCHC: 32 g/dL (ref 31.5–35.7)
MCV: 91 fL (ref 79–97)
Monocytes Absolute: 0.6 10*3/uL (ref 0.1–0.9)
Monocytes: 9 %
Neutrophils Absolute: 4 10*3/uL (ref 1.4–7.0)
Neutrophils: 62 %
Platelets: 304 10*3/uL (ref 150–450)
RBC: 3.99 x10E6/uL (ref 3.77–5.28)
RDW: 13.2 % (ref 11.7–15.4)
WBC: 6.5 10*3/uL (ref 3.4–10.8)

## 2019-06-15 LAB — URINALYSIS
Bilirubin, UA: NEGATIVE
Glucose, UA: NEGATIVE
Leukocytes,UA: NEGATIVE
Nitrite, UA: NEGATIVE
Protein,UA: NEGATIVE
Specific Gravity, UA: 1.025 (ref 1.005–1.030)
Urobilinogen, Ur: 0.2 mg/dL (ref 0.2–1.0)
pH, UA: 5.5 (ref 5.0–7.5)

## 2019-06-15 MED ORDER — TRAZODONE HCL 50 MG PO TABS: 25.0000 mg | ORAL_TABLET | Freq: Every evening | ORAL | 1 refills | Status: DC | PRN

## 2019-06-15 MED ORDER — ISOSORBIDE MONONITRATE ER 60 MG PO TB24: 60.0000 mg | ORAL_TABLET | Freq: Every day | ORAL | 1 refills | Status: DC

## 2019-06-15 MED ORDER — ATORVASTATIN CALCIUM 40 MG PO TABS: 40.0000 mg | ORAL_TABLET | Freq: Every day | ORAL | 1 refills | Status: DC

## 2019-06-15 MED ORDER — CLOPIDOGREL BISULFATE 75 MG PO TABS: 75.0000 mg | ORAL_TABLET | Freq: Every day | ORAL | 1 refills | Status: DC

## 2019-06-15 MED ORDER — METOPROLOL SUCCINATE ER 50 MG PO TB24: 50.0000 mg | ORAL_TABLET | Freq: Every day | ORAL | 3 refills | Status: DC

## 2019-06-15 MED ORDER — AMLODIPINE BESYLATE 10 MG PO TABS: 10.0000 mg | ORAL_TABLET | Freq: Every day | ORAL | 1 refills | Status: DC

## 2019-06-15 MED ORDER — TRAMADOL HCL 50 MG PO TABS: 50.0000 mg | ORAL_TABLET | ORAL | 1 refills | Status: DC

## 2019-06-15 MED ORDER — BENAZEPRIL HCL 40 MG PO TABS: 40.0000 mg | ORAL_TABLET | Freq: Every day | ORAL | 1 refills | Status: DC

## 2019-06-15 MED ORDER — METOPROLOL SUCCINATE ER 100 MG PO TB24: ORAL_TABLET | ORAL | 1 refills | Status: DC

## 2019-06-15 MED ORDER — PAROXETINE HCL 40 MG PO TABS: 40.0000 mg | ORAL_TABLET | Freq: Every day | ORAL | 1 refills | Status: DC

## 2019-06-15 MED ORDER — HYDROCHLOROTHIAZIDE 25 MG PO TABS: 25.0000 mg | ORAL_TABLET | Freq: Every day | ORAL | 1 refills | Status: DC

## 2019-06-15 NOTE — Progress Notes (Signed)
Subjective:    Patient ID: Teresa Holloway, female    DOB: 19-Dec-1940, 79 y.o.   MRN: [id number]   Chief Complaint: Medical Management of Chronic Issues    HPI:  1. Essential hypertension, benign Low c/o chest pain, sob or headache. Does not check blood presure at home. BP Readings from Last 3 Encounters:  06/15/19 (!) 103/56  01/28/19 (!) 123/58  01/18/19 116/60     2. Coronary artery disease involving native coronary artery of native heart with angina pectoris with documented spasm [facility]) Had telephone visit with cardiology 12/17/18. At that time she was cleared for her total knee replacement. She denies any changes since surgery.  3. Hyperlipidemia with target LDL less than 100 Does not watch diet and does very little exercise. Lab Results  Component Value Date   CHOL 125 12/10/2018   HDL 62 12/10/2018   LDLCALC 42 12/10/2018   TRIG 104 12/10/2018   CHOLHDL 2.0 12/10/2018     4. Gastroesophageal reflux disease without esophagitis Is currently on no prescription meds for this.  5. Primary osteoarthritis of right hip Since she had knee replacement her right hip has been hurting. Thinks she is going to need a total hip replacement. She has been taking tramadol on occasion when pain get sbad.  6. Recurrent major depressive disorder, in full remission (HCC) Is on paxil daily and is dong well. Depression screen [facility] 2/9 06/15/2019 03/12/2019 01/07/2019  Decreased Interest 0 0 0  Down, Depressed, Hopeless 0 1 0  PHQ - 2 Score 0 1 0     7. BMI 30.0-30.9,adult Weight is down 11lbs from previous visit Wt Readings from Last 3 Encounters:  06/15/19 154 lb (69.9 kg)  01/25/19 163 lb (73.9 kg)  01/18/19 161 lb 1 oz (73.1 kg)   BMI Readings from Last 3 Encounters:  06/15/19 26.43 kg/m  01/25/19 27.98 kg/m  01/18/19 24.49 kg/m   8. Insomnia-  Takes trazadone to sleep at night. Says that she sleeps well.  Outpatient Encounter Medications as of 06/15/2019    Medication Sig  . alendronate (FOSAMAX) 70 MG tablet TAKE 1 TABLE BY MOUTH EVERY 7 DAYS. TAKE WITH A FULL GLASS OF WATER ON AN EMPTY STOMACH.  Marland Kitchen amLODipine (NORVASC) 10 MG tablet Take 1 tablet (10 mg total) by mouth daily. (Patient taking differently: Take 10 mg by mouth at bedtime. )  . aspirin EC 81 MG tablet Take 1 tablet (81 mg total) by mouth 2 (two) times daily.  Marland Kitchen atorvastatin (LIPITOR) 40 MG tablet Take 1 tablet (40 mg total) by mouth daily at 6 PM.  . benazepril (LOTENSIN) 40 MG tablet TAKE 1 TABLET BY MOUTH EVERY DAY  . cholecalciferol (VITAMIN D) 1000 UNITS tablet Take 1,000 Units by mouth daily.  . clopidogrel (PLAVIX) 75 MG tablet TAKE 1 TABLET BY MOUTH EVERY DAY  . diclofenac sodium (VOLTAREN) 1 % GEL Apply 2 g topically 4 (four) times daily. (Patient taking differently: Apply 2 g topically 4 (four) times daily as needed (pain). )  . hydrochlorothiazide (HYDRODIURIL) 25 MG tablet Take 1 tablet (25 mg total) by mouth daily. (Patient taking differently: Take 25 mg by mouth at bedtime. )  . isosorbide mononitrate (IMDUR) 60 MG 24 hr tablet Take 1 tablet (60 mg total) by mouth daily. (Patient taking differently: Take 60 mg by mouth at bedtime. )  . latanoprost (XALATAN) 0.005 % ophthalmic solution Place 1 drop into both eyes at bedtime.  . metoprolol succinate (TOPROL-XL) 100 MG  24 hr tablet TAKE 1 TABLET (100 MG TOTAL) BY MOUTH DAILY. TAKE 1/2 TABLET DAILY  . PARoxetine (PAXIL) 40 MG tablet Take 1 tablet (40 mg total) by mouth at bedtime.  . traMADol (ULTRAM) 50 MG tablet Take 1 tablet (50 mg total) by mouth every 4 (four) hours.  . traZODone (DESYREL) 50 MG tablet Take 0.5-1 tablets (25-50 mg total) by mouth at bedtime as needed for sleep.  . nitroGLYCERIN (NITROSTAT) 0.4 MG SL tablet Place 1 tablet (0.4 mg total) under the tongue every 5 (five) minutes as needed for chest pain. (Patient not taking: Reported on 06/15/2019)    Past Surgical History:  Procedure Laterality Date  .  ECTOPIC PREGNANCY SURGERY    . left breast biospy    . left wrist fracture    . TOTAL KNEE ARTHROPLASTY Left 01/25/2019   Procedure: LEFT TOTAL KNEE ARTHROPLASTY;  Surgeon: Gean Birchwood, MD;  Location: [facility];  Service: Orthopedics;  Laterality: Left;    Family History  Problem Relation Age of Onset  . Asthma Father   . Diabetes Sister   . Diabetes Brother     New complaints: Dysuria started several days ago  Social history: Lives with her husband  Controlled substance contract: 06/15/19    Review of Systems  Constitutional: Negative for diaphoresis.  Eyes: Negative for pain.  Respiratory: Negative for shortness of breath.   Cardiovascular: Negative for chest pain, palpitations and leg swelling.  Gastrointestinal: Negative for abdominal pain.  Endocrine: Negative for polydipsia.  Genitourinary: Positive for dysuria and frequency. Negative for hematuria and urgency.  Musculoskeletal: Positive for arthralgias (right hip pain).  Skin: Negative for rash.  Neurological: Negative for dizziness, weakness and headaches.  Hematological: Does not bruise/bleed easily.  All other systems reviewed and are negative.      Objective:   Physical Exam Vitals and nursing note reviewed.  Constitutional:      General: She is not in acute distress.    Appearance: Normal appearance. She is well-developed.  HENT:     Head: Normocephalic.     Nose: Nose normal.  Eyes:     Pupils: Pupils are equal, round, and reactive to light.  Neck:     Vascular: No carotid bruit or JVD.  Cardiovascular:     Rate and Rhythm: Normal rate and regular rhythm.     Heart sounds: Normal heart sounds.  Pulmonary:     Effort: Pulmonary effort is normal. No respiratory distress.     Breath sounds: Normal breath sounds. No wheezing or rales.  Chest:     Chest wall: No tenderness.  Abdominal:     General: Bowel sounds are normal. There is no distension or abdominal bruit.     Palpations: Abdomen is soft.  There is no hepatomegaly, splenomegaly, mass or pulsatile mass.     Tenderness: There is no abdominal tenderness.  Musculoskeletal:        General: Normal range of motion.     Cervical back: Normal range of motion and neck supple.     Comments: Rises slowly from sitting to standing. Pain with weight bearing on right hip Limping gait on right hip.  Lymphadenopathy:     Cervical: No cervical adenopathy.  Skin:    General: Skin is warm and dry.  Neurological:     Mental Status: She is alert and oriented to person, place, and time.     Deep Tendon Reflexes: Reflexes are normal and symmetric.  Psychiatric:  Behavior: Behavior normal.        Thought Content: Thought content normal.        Judgment: Judgment normal.     BP (!) 103/56   Pulse (!) 55   Temp (!) 97.3 F (36.3 C)   Resp 20   Ht 5\' 4"  (1.626 m)   Wt 154 lb (69.9 kg)   SpO2 98%   BMI 26.43 kg/m   Urinalysis is normal    Assessment & Plan:  WILLENE DIMATTIA comes in today with chief complaint of Medical Management of Chronic Issues   Diagnosis and orders addressed:  1. Essential hypertension, benign Low sodium diet - benazepril (LOTENSIN) 40 MG tablet; Take 1 tablet (40 mg total) by mouth daily.  Dispense: 90 tablet; Refill: 1 - hydrochlorothiazide (HYDRODIURIL) 25 MG tablet; Take 1 tablet (25 mg total) by mouth daily.  Dispense: 90 tablet; Refill: 1 - amLODipine (NORVASC) 10 MG tablet; Take 1 tablet (10 mg total) by mouth daily.  Dispense: 90 tablet; Refill: 1  2. Coronary artery disease involving native coronary artery of native heart with angina pectoris with documented spasm (HCC) - clopidogrel (PLAVIX) 75 MG tablet; Take 1 tablet (75 mg total) by mouth daily.  Dispense: 90 tablet; Refill: 1 - metoprolol succinate (TOPROL-XL) 100 MG 24 hr tablet; TAKE 1 TABLET (100 MG TOTAL) BY MOUTH DAILY. TAKE 1/2 TABLET DAILY  Dispense: 90 tablet; Refill: 1 - isosorbide mononitrate (IMDUR) 60 MG 24 hr tablet; Take 1  tablet (60 mg total) by mouth at bedtime.  Dispense: 90 tablet; Refill: 1  3. Hyperlipidemia with target LDL less than 100 Low fat diet - atorvastatin (LIPITOR) 40 MG tablet; Take 1 tablet (40 mg total) by mouth daily at 6 PM.  Dispense: 90 tablet; Refill: 1  4. Gastroesophageal reflux disease without esophagitis *Avoid spicy foods Do not eat 2 hours prior to bedtime**  5. Primary osteoarthritis of right hip She will make appointment to see orthopedic surgeon  traMADol (ULTRAM) 50 MG tablet; Take 1 tablet (50 mg total) by mouth every 4 (four) hours.  Dispense: 30 tablet; Refill: 1   6. Recurrent major depressive disorder, in full remission (HCC) Stress management - PARoxetine (PAXIL) 40 MG tablet; Take 1 tablet (40 mg total) by mouth at bedtime.  Dispense: 90 tablet; Refill: 1  7. BMI 30.0-30.9,adult Discussed diet and exercise for person with BMI >25 Will recheck weight in 3-6 months  8. Urine frequency Force fluids - Urine Culture - Urinalysis  9. Primary insomnia Bedtime routine - traZODone (DESYREL) 50 MG tablet; Take 0.5-1 tablets (25-50 mg total) by mouth at bedtime as needed for sleep.  Dispense: 90 tablet; Refill: 1   Labs pending Health Maintenance reviewed Diet and exercise encouraged  Follow up plan: 3 months   Mary-Margaret Daphine Deutscher, FNP

## 2019-06-15 NOTE — Addendum Note (Signed)
Addended by: Zannie Cove on: 06/15/2019 10:48 AM   Modules accepted: Orders

## 2019-06-15 NOTE — Telephone Encounter (Signed)
Talked to Teresa Holloway 

## 2019-06-15 NOTE — Patient Instructions (Signed)
Hip Pain The hip is the joint between the upper legs and the lower pelvis. The bones, cartilage, tendons, and muscles of your hip joint support your body and allow you to move around. Hip pain can range from a minor ache to severe pain in one or both of your hips. The pain may be felt on the inside of the hip joint near the groin, or on the outside near the buttocks and upper thigh. You may also have swelling or stiffness in your hip area. Follow these instructions at home: Managing pain, stiffness, and swelling      If directed, put ice on the painful area. To do this: ? Put ice in a plastic bag. ? Place a towel between your skin and the bag. ? Leave the ice on for 20 minutes, 2-3 times a day.  If directed, apply heat to the affected area as often as told by your health care provider. Use the heat source that your health care provider recommends, such as a moist heat pack or a heating pad. ? Place a towel between your skin and the heat source. ? Leave the heat on for 20-30 minutes. ? Remove the heat if your skin turns bright red. This is especially important if you are unable to feel pain, heat, or cold. You may have a greater risk of getting burned. Activity  Do exercises as told by your health care provider.  Avoid activities that cause pain. General instructions   Take over-the-counter and prescription medicines only as told by your health care provider.  Keep a journal of your symptoms. Write down: ? How often you have hip pain. ? The location of your pain. ? What the pain feels like. ? What makes the pain worse.  Sleep with a pillow between your legs on your most comfortable side.  Keep all follow-up visits as told by your health care provider. This is important. Contact a health care provider if:  You cannot put weight on your leg.  Your pain or swelling continues or gets worse after one week.  It gets harder to walk.  You have a fever. Get help right away  if:  You fall.  You have a sudden increase in pain and swelling in your hip.  Your hip is red or swollen or very tender to touch. Summary  Hip pain can range from a minor ache to severe pain in one or both of your hips.  The pain may be felt on the inside of the hip joint near the groin, or on the outside near the buttocks and upper thigh.  Avoid activities that cause pain.  Write down how often you have hip pain, the location of the pain, what makes it worse, and what it feels like. This information is not intended to replace advice given to you by your health care provider. Make sure you discuss any questions you have with your health care provider. Document Revised: 08/24/2018 Document Reviewed: 08/24/2018 Elsevier Patient Education  2020 Elsevier Inc[employer] -- 

## 2019-06-16 LAB — URINE CULTURE

## 2019-06-21 ENCOUNTER — Other Ambulatory Visit: Payer: Self-pay | Admitting: Nurse Practitioner

## 2019-06-21 DIAGNOSIS — M810 Age-related osteoporosis without current pathological fracture: Secondary | ICD-10-CM

## 2019-06-23 DIAGNOSIS — Z23 Encounter for immunization: Secondary | ICD-10-CM | POA: Diagnosis not present

## 2019-07-13 DIAGNOSIS — M7061 Trochanteric bursitis, right hip: Secondary | ICD-10-CM | POA: Diagnosis not present

## 2019-07-13 DIAGNOSIS — Z96652 Presence of left artificial knee joint: Secondary | ICD-10-CM | POA: Diagnosis not present

## 2019-07-13 DIAGNOSIS — M1711 Unilateral primary osteoarthritis, right knee: Secondary | ICD-10-CM | POA: Diagnosis not present

## 2019-07-19 ENCOUNTER — Telehealth: Payer: Self-pay | Admitting: Nurse Practitioner

## 2019-07-19 NOTE — Chronic Care Management (AMB) (Signed)
Chronic Care Management   Note  07/19/2019 Name: Teresa Holloway MRN: [id number] DOB: Sep 06, 1940  Teresa Holloway is a 79 y.o. year old female who is a primary care patient of Bennie Pierini, FNP. I reached out to Teresa Holloway by phone today in response to a referral sent by [name]'s health plan.     [name] was given information about Chronic Care Management services today including:  1. CCM service includes personalized support from designated clinical staff supervised by her physician, including individualized plan of care and coordination with other care providers 2. 24/7 contact phone numbers for assistance for urgent and routine care needs. 3. Service will only be billed when office clinical staff spend 20 minutes or more in a month to coordinate care. 4. Only one practitioner may furnish and bill the service in a calendar month. 5. The patient may stop CCM services at any time (effective at the end of the month) by phone call to the office staff. 6. The patient will be responsible for cost sharing (co-pay) of up to 20% of the service fee (after annual deductible is met).  Patient agreed to services and verbal consent obtained.   Follow up plan: Telephone appointment with care management team member scheduled for:02/25/2020  Penne Lash, RMA Care Guide, Embedded Care Coordination [facility]  [place], [place] [address] Direct Dial: [phone] [email or web address] Website: [email or web address]

## 2019-07-21 DIAGNOSIS — Z23 Encounter for immunization: Secondary | ICD-10-CM | POA: Diagnosis not present

## 2019-08-27 ENCOUNTER — Telehealth: Payer: Self-pay | Admitting: Nurse Practitioner

## 2019-08-27 MED ORDER — CEPHALEXIN 500 MG PO CAPS: 500.0000 mg | ORAL_CAPSULE | Freq: Two times a day (BID) | ORAL | 0 refills | Status: DC

## 2019-08-27 NOTE — Telephone Encounter (Signed)
No appt. Patient complains of low back pain with dysuria. Azo is helping asking for an abx. Patient uses [facility] in [place].

## 2019-08-27 NOTE — Telephone Encounter (Signed)
Patient aware, script is ready. 

## 2019-08-27 NOTE — Telephone Encounter (Signed)
Sent in keflex rx

## 2019-09-07 DIAGNOSIS — M7061 Trochanteric bursitis, right hip: Secondary | ICD-10-CM | POA: Diagnosis not present

## 2019-09-10 ENCOUNTER — Telehealth: Payer: Self-pay | Admitting: Nurse Practitioner

## 2019-09-10 NOTE — Telephone Encounter (Signed)
Has to be seen for refill

## 2019-09-10 NOTE — Telephone Encounter (Signed)
Note sent on patient's request.

## 2019-09-10 NOTE — Telephone Encounter (Signed)
Pt understands. She will see Mary-Margaret on Friday next week.

## 2019-09-14 ENCOUNTER — Other Ambulatory Visit: Payer: Self-pay | Admitting: Nurse Practitioner

## 2019-09-14 DIAGNOSIS — M810 Age-related osteoporosis without current pathological fracture: Secondary | ICD-10-CM

## 2019-09-15 ENCOUNTER — Telehealth: Payer: Self-pay | Admitting: Nurse Practitioner

## 2019-09-16 ENCOUNTER — Ambulatory Visit (INDEPENDENT_AMBULATORY_CARE_PROVIDER_SITE_OTHER): Payer: Medicare Other | Admitting: Nurse Practitioner

## 2019-09-16 ENCOUNTER — Other Ambulatory Visit: Payer: Self-pay

## 2019-09-16 ENCOUNTER — Encounter: Payer: Self-pay | Admitting: Nurse Practitioner

## 2019-09-16 VITALS — BP 97/57 | HR 55 | Temp 97.7°F | Resp 20 | Ht 64.0 in | Wt 155.0 lb

## 2019-09-16 DIAGNOSIS — I25111 Atherosclerotic heart disease of native coronary artery with angina pectoris with documented spasm: Secondary | ICD-10-CM

## 2019-09-16 DIAGNOSIS — M1612 Unilateral primary osteoarthritis, left hip: Secondary | ICD-10-CM

## 2019-09-16 DIAGNOSIS — F3342 Major depressive disorder, recurrent, in full remission: Secondary | ICD-10-CM | POA: Diagnosis not present

## 2019-09-16 DIAGNOSIS — M25551 Pain in right hip: Secondary | ICD-10-CM

## 2019-09-16 DIAGNOSIS — I10 Essential (primary) hypertension: Secondary | ICD-10-CM | POA: Diagnosis not present

## 2019-09-16 DIAGNOSIS — E785 Hyperlipidemia, unspecified: Secondary | ICD-10-CM | POA: Diagnosis not present

## 2019-09-16 DIAGNOSIS — Z6830 Body mass index (BMI) 30.0-30.9, adult: Secondary | ICD-10-CM

## 2019-09-16 MED ORDER — TRAMADOL HCL 50 MG PO TABS: 50.0000 mg | ORAL_TABLET | Freq: Two times a day (BID) | ORAL | 2 refills | Status: DC

## 2019-09-16 NOTE — Patient Instructions (Signed)
Hip Bursitis  Hip bursitis is swelling of a fluid-filled sac (bursa) in your hip joint. This swelling (inflammation) can be painful. This condition may come and go over time. What are the causes?  Injury to the hip.  Overuse of the muscles that surround the hip joint.  An earlier injury or surgery of the hip.  Arthritis or gout.  Diabetes.  Thyroid disease.  Infection.  In some cases, the cause may not be known. What are the signs or symptoms?  Mild or moderate pain in the hip area. Pain may get worse with movement.  Tenderness and swelling of the hip, especially on the outer side of the hip.  In rare cases, the bursa may become infected. This may cause: ? A fever. ? Warmth and redness in the area. Symptoms may come and go. How is this treated? This condition is treated by resting, icing, applying pressure (compression), and raising (elevating) the injured area. You may hear this called the RICE treatment. Treatment may also include:  Using crutches.  Draining fluid out of the bursa to help relieve swelling.  Giving a shot of (injecting) medicine that helps to reduce swelling (cortisone).  Other medicines if the bursa is infected. Follow these instructions at home: Managing pain, stiffness, and swelling   If told, put ice on the painful area. ? Put ice in a plastic bag. ? Place a towel between your skin and the bag. ? Leave the ice on for 20 minutes, 2-3 times a day. ? Raise (elevate) your hip above the level of your heart as much as you can without pain. To do this, try putting a pillow under your hips while you lie down. Stop if this causes pain. Activity  Return to your normal activities as told by your doctor. Ask your doctor what activities are safe for you.  Rest and protect your hip as much as you can until you feel better. General instructions  Take over-the-counter and prescription medicines only as told by your doctor.  Wear wraps that put pressure  on your hip (compression wraps) only as told by your doctor.  Do not use your hip to support your body weight until your doctor says that you can.  Use crutches as told by your doctor.  Gently rub and stretch your injured area as often as is comfortable.  Keep all follow-up visits as told by your doctor. This is important. How is this prevented?  Exercise regularly, as told by your doctor.  Warm up and stretch before being active.  Cool down and stretch after being active.  Avoid activities that bother your hip or cause pain.  Avoid sitting down for long periods at a time. Contact a doctor if:  You have a fever.  You get new symptoms.  You have trouble walking.  You have trouble doing everyday activities.  You have pain that gets worse.  You have pain that does not get better with medicine.  You get red skin on your hip area.  You get a feeling of warmth in your hip area. Get help right away if:  You cannot move your hip.  You have very bad pain. Summary  Hip bursitis is swelling of a fluid-filled sac (bursa) in your hip.  Hip bursitis can be painful.  Symptoms often come and go over time.  This condition is treated with rest, ice, compression, elevation, and medicines. This information is not intended to replace advice given to you by your health care provider.   Make sure you discuss any questions you have with your health care provider. Document Revised: 12/15/2017 Document Reviewed: 12/15/2017 Elsevier Patient Education  2020 Elsevier Inc.  

## 2019-09-16 NOTE — Progress Notes (Signed)
Subjective:    Patient ID: Teresa Holloway, female    DOB: 05-28-1940, 79 y.o.   MRN: [id number]   Chief Complaint: Medical Management of Chronic Issues    HPI:  1. Essential hypertension, benign No c/o chest pain, ob or headache. Does not check blood pressure at home. BP Readings from Last 3 Encounters:  09/16/19 (!) 97/57  06/15/19 (!) 103/56  01/28/19 (!) 123/58     2. Hyperlipidemia with target LDL less than 100 Does not watch diet and does very little exrcise. Lab Results  Component Value Date   CHOL 126 06/15/2019   HDL 69 06/15/2019   LDLCALC 41 06/15/2019   TRIG 79 06/15/2019   CHOLHDL 1.8 06/15/2019     3. Coronary artery disease involving native coronary artery of native heart with angina pectoris with documented spasm [facility]) Has not seen cardiology in several years. She say sthat she ha an appointment coming up on June 15,2021.  4. Primary osteoarthritis of left hip Has chronic right hip pain. [name] gotten worse since she had Total knee replacement on left. Ortho ha dx her with bursiti and I sending her for physical therapy. Pain assessment: Cause of pain- bursitis of right hip Pain location- right hip Pain on scale of 1-10- 9/10 currently Frequency- daily What increases pain-laying down and standing and walking increases pain What makes pain Better-heating pad helps a little Effects on ADL - some days it hurts o bad that he can not get much done. Any change in general medical condition-none  Current opioids rx- tramadon 50mg  BID # meds rx- #60 Effectiveness of current meds-bring pain down to 2/10 Adverse reactions form pain meds-none Morphine equivalent-  Pill count performed-No Last drug screen - 12/10/18 ( high risk q49m, moderate risk q20m, low risk yearly ) Urine drug screen today- No Was the NCCSR reviewed- yes  If yes were their any concerning findings? - no   Overdose risk: 1   Pain contract signed on:06/16/19  5. Recurrent major  depressive disorder, in full remission (HCC) Is on paxil daily. Doing well. Depression screen [facility] 2/9 06/15/2019 03/12/2019 01/07/2019  Decreased Interest 0 0 0  Down, Depressed, Hopeless 0 1 0  PHQ - 2 Score 0 1 0     6. BMI 30.0-30.9,adult No recent weight changes Wt Readings from Last 3 Encounters:  09/16/19 155 lb (70.3 kg)  06/15/19 154 lb (69.9 kg)  01/25/19 163 lb (73.9 kg)   BMI Readings from Last 3 Encounters:  09/16/19 26.61 kg/m  06/15/19 26.43 kg/m  01/25/19 27.98 kg/m        Outpatient Encounter Medications as of 09/16/2019  Medication Sig  . alendronate (FOSAMAX) 70 MG tablet TAKE 1 TABLE BY MOUTH EVERY 7 DAYS. TAKE WITH A FULL GLASS OF WATER ON AN EMPTY STOMACH.  Marland Kitchen amLODipine (NORVASC) 10 MG tablet Take 1 tablet (10 mg total) by mouth daily.  Marland Kitchen aspirin EC 81 MG tablet Take 1 tablet (81 mg total) by mouth 2 (two) times daily.  Marland Kitchen atorvastatin (LIPITOR) 40 MG tablet Take 1 tablet (40 mg total) by mouth daily at 6 PM.  . benazepril (LOTENSIN) 40 MG tablet Take 1 tablet (40 mg total) by mouth daily.  . cephALEXin (KEFLEX) 500 MG capsule Take 1 capsule (500 mg total) by mouth 2 (two) times daily.  . cholecalciferol (VITAMIN D) 1000 UNITS tablet Take 1,000 Units by mouth daily.  . clopidogrel (PLAVIX) 75 MG tablet Take 1 tablet (75 mg total)  by mouth daily.  . diclofenac sodium (VOLTAREN) 1 % GEL Apply 2 g topically 4 (four) times daily. (Patient taking differently: Apply 2 g topically 4 (four) times daily as needed (pain). )  . hydrochlorothiazide (HYDRODIURIL) 25 MG tablet Take 1 tablet (25 mg total) by mouth daily.  . isosorbide mononitrate (IMDUR) 60 MG 24 hr tablet Take 1 tablet (60 mg total) by mouth at bedtime.  Marland Kitchen latanoprost (XALATAN) 0.005 % ophthalmic solution Place 1 drop into both eyes at bedtime.  . metoprolol succinate (TOPROL-XL) 50 MG 24 hr tablet Take 1 tablet (50 mg total) by mouth daily. Take with or immediately following a meal.  . nitroGLYCERIN  (NITROSTAT) 0.4 MG SL tablet Place 1 tablet (0.4 mg total) under the tongue every 5 (five) minutes as needed for chest pain.  Marland Kitchen PARoxetine (PAXIL) 40 MG tablet Take 1 tablet (40 mg total) by mouth at bedtime.  . traMADol (ULTRAM) 50 MG tablet Take 1 tablet (50 mg total) by mouth every 4 (four) hours.  . traZODone (DESYREL) 50 MG tablet Take 0.5-1 tablets (25-50 mg total) by mouth at bedtime as needed for sleep.     Past Surgical History:  Procedure Laterality Date  . ECTOPIC PREGNANCY SURGERY    . left breast biospy    . left wrist fracture    . TOTAL KNEE ARTHROPLASTY Left 01/25/2019   Procedure: LEFT TOTAL KNEE ARTHROPLASTY;  Surgeon: Gean Birchwood, MD;  Location: [facility];  Service: Orthopedics;  Laterality: Left;    Family History  Problem Relation Age of Onset  . Asthma Father   . Diabetes Sister   . Diabetes Brother     New complaints: None today  Social history: Live with her husband whom she think I developing dementia.   Review of Systems  Constitutional: Negative for diaphoresis.  Eyes: Negative for pain.  Respiratory: Negative for shortness of breath.   Cardiovascular: Negative for chest pain, palpitations and leg swelling.  Gastrointestinal: Negative for abdominal pain.  Endocrine: Negative for polydipsia.  Musculoskeletal: Positive for arthralgias (right hip pain).  Skin: Negative for rash.  Neurological: Negative for dizziness, weakness and headaches.  Hematological: Does not bruise/bleed easily.  All other systems reviewed and are negative.      Objective:   Physical Exam Vitals and nursing note reviewed.  Constitutional:      General: She is not in acute distress.    Appearance: Normal appearance. She is well-developed.  HENT:     Head: Normocephalic.     Nose: Nose normal.  Eyes:     Pupils: Pupils are equal, round, and reactive to light.  Neck:     Vascular: No carotid bruit or JVD.  Cardiovascular:     Rate and Rhythm: Normal rate and regular  rhythm.     Heart sounds: Normal heart sounds.  Pulmonary:     Effort: Pulmonary effort is normal. No respiratory distress.     Breath sounds: Normal breath sounds. No wheezing or rales.  Chest:     Chest wall: No tenderness.  Abdominal:     General: Bowel sounds are normal. There is no distension or abdominal bruit.     Palpations: Abdomen is soft. There is no hepatomegaly, splenomegaly, mass or pulsatile mass.     Tenderness: There is no abdominal tenderness.  Musculoskeletal:        General: Normal range of motion.     Cervical back: Normal range of motion and neck supple.     Comments:  Rising lowly from sitting to standing. Light limp on right with ambulating  Lymphadenopathy:     Cervical: No cervical adenopathy.  Skin:    General: Skin is warm and dry.  Neurological:     Mental Status: She is alert and oriented to person, place, and time.     Deep Tendon Reflexes: Reflexes are normal and symmetric.  Psychiatric:        Behavior: Behavior normal.        Thought Content: Thought content normal.        Judgment: Judgment normal.    BP (!) 97/57   Pulse (!) 55   Temp 97.7 F (36.5 C) (Temporal)   Resp 20   Ht 5\' 4"  (1.626 m)   Wt 155 lb (70.3 kg)   SpO2 97%   BMI 26.61 kg/m         Assessment & Plan:  Teresa Holloway comes in today with chief complaint of Medical Management of Chronic Issues   Diagnosis and orders addressed:  1. Essential hypertension, benign Low sodium diet  2. Hyperlipidemia with target LDL less than 100 Low ffat diet  3. Coronary artery disease involving native coronary artery of native heart with angina pectoris with documented spasm [facility]) Keep follow up appointment with cardiology  4. Primary osteoarthritis of left hip Keep follow with ortho  5. Recurrent major depressive disorder, in full remission (HCC) stress management  6. BMI 30.0-30.9,adult Discussed diet and exercise for person with BMI >25 Will recheck weight in 3-6  months   7. Right hip pain PT as precribed - traMADol (ULTRAM) 50 MG tablet; Take 1 tablet (50 mg total) by mouth 2 (two) times daily.  Dispense: 60 tablet; Refill: 2   Labs pending Health Maintenance reviewed Diet and exercise encouraged  Follow up plan: 3 months   Mary-Margaret Daphine Deutscher, FNP

## 2019-09-16 NOTE — Addendum Note (Signed)
Addended by: Rolena Infante on: 09/16/2019 02:51 PM   Modules accepted: Orders

## 2019-09-17 LAB — CMP14+EGFR
ALT: 8 IU/L (ref 0–32)
AST: 15 IU/L (ref 0–40)
Albumin/Globulin Ratio: 2 (ref 1.2–2.2)
Albumin: 4.2 g/dL (ref 3.7–4.7)
Alkaline Phosphatase: 98 IU/L (ref 48–121)
BUN/Creatinine Ratio: 18 (ref 12–28)
BUN: 19 mg/dL (ref 8–27)
Bilirubin Total: 0.6 mg/dL (ref 0.0–1.2)
CO2: 26 mmol/L (ref 20–29)
Calcium: 9.7 mg/dL (ref 8.7–10.3)
Chloride: 105 mmol/L (ref 96–106)
Creatinine, Ser: 1.08 mg/dL — ABNORMAL HIGH (ref 0.57–1.00)
GFR calc Af Amer: 57 mL/min/{1.73_m2} — ABNORMAL LOW (ref >59)
GFR calc non Af Amer: 50 mL/min/{1.73_m2} — ABNORMAL LOW (ref >59)
Globulin, Total: 2.1 g/dL (ref 1.5–4.5)
Glucose: 96 mg/dL (ref 65–99)
Potassium: 3.8 mmol/L (ref 3.5–5.2)
Sodium: 144 mmol/L (ref 134–144)
Total Protein: 6.3 g/dL (ref 6.0–8.5)

## 2019-09-17 LAB — CBC WITH DIFFERENTIAL/PLATELET
Basophils Absolute: 0.1 10*3/uL (ref 0.0–0.2)
Basos: 1 %
EOS (ABSOLUTE): 0.2 10*3/uL (ref 0.0–0.4)
Eos: 3 %
Hematocrit: 35.5 % (ref 34.0–46.6)
Hemoglobin: 11.3 g/dL (ref 11.1–15.9)
Immature Grans (Abs): 0 10*3/uL (ref 0.0–0.1)
Immature Granulocytes: 1 %
Lymphocytes Absolute: 1.5 10*3/uL (ref 0.7–3.1)
Lymphs: 25 %
MCH: 29.4 pg (ref 26.6–33.0)
MCHC: 31.8 g/dL (ref 31.5–35.7)
MCV: 92 fL (ref 79–97)
Monocytes Absolute: 0.5 10*3/uL (ref 0.1–0.9)
Monocytes: 9 %
Neutrophils Absolute: 3.6 10*3/uL (ref 1.4–7.0)
Neutrophils: 61 %
Platelets: 275 10*3/uL (ref 150–450)
RBC: 3.85 x10E6/uL (ref 3.77–5.28)
RDW: 13.2 % (ref 11.7–15.4)
WBC: 5.9 10*3/uL (ref 3.4–10.8)

## 2019-09-17 LAB — LIPID PANEL
Chol/HDL Ratio: 1.8 ratio (ref 0.0–4.4)
Cholesterol, Total: 124 mg/dL (ref 100–199)
HDL: 69 mg/dL (ref >39)
LDL Chol Calc (NIH): 41 mg/dL (ref 0–99)
Triglycerides: 70 mg/dL (ref 0–149)
VLDL Cholesterol Cal: 14 mg/dL (ref 5–40)

## 2019-10-06 DIAGNOSIS — I259 Chronic ischemic heart disease, unspecified: Secondary | ICD-10-CM | POA: Diagnosis not present

## 2019-10-06 DIAGNOSIS — R001 Bradycardia, unspecified: Secondary | ICD-10-CM | POA: Diagnosis not present

## 2019-10-21 ENCOUNTER — Other Ambulatory Visit: Payer: Self-pay | Admitting: Nurse Practitioner

## 2019-10-21 DIAGNOSIS — F5101 Primary insomnia: Secondary | ICD-10-CM

## 2019-10-27 DIAGNOSIS — H40023 Open angle with borderline findings, high risk, bilateral: Secondary | ICD-10-CM | POA: Diagnosis not present

## 2019-10-27 DIAGNOSIS — H2589 Other age-related cataract: Secondary | ICD-10-CM | POA: Diagnosis not present

## 2019-10-27 DIAGNOSIS — H524 Presbyopia: Secondary | ICD-10-CM | POA: Diagnosis not present

## 2019-10-27 DIAGNOSIS — H25813 Combined forms of age-related cataract, bilateral: Secondary | ICD-10-CM | POA: Diagnosis not present

## 2019-10-27 DIAGNOSIS — H5213 Myopia, bilateral: Secondary | ICD-10-CM | POA: Diagnosis not present

## 2019-10-27 DIAGNOSIS — H35363 Drusen (degenerative) of macula, bilateral: Secondary | ICD-10-CM | POA: Diagnosis not present

## 2019-10-27 DIAGNOSIS — H52223 Regular astigmatism, bilateral: Secondary | ICD-10-CM | POA: Diagnosis not present

## 2019-12-01 IMAGING — DX DG CHEST 2V
2 series · 2 of 2 positions shown · non-contrast
Comparison: Chest radiograph 04/01/2014

CLINICAL DATA: Pre-for knee.   No chest complaints.

EXAM:
CHEST - 2 VIEW

[chest pa]
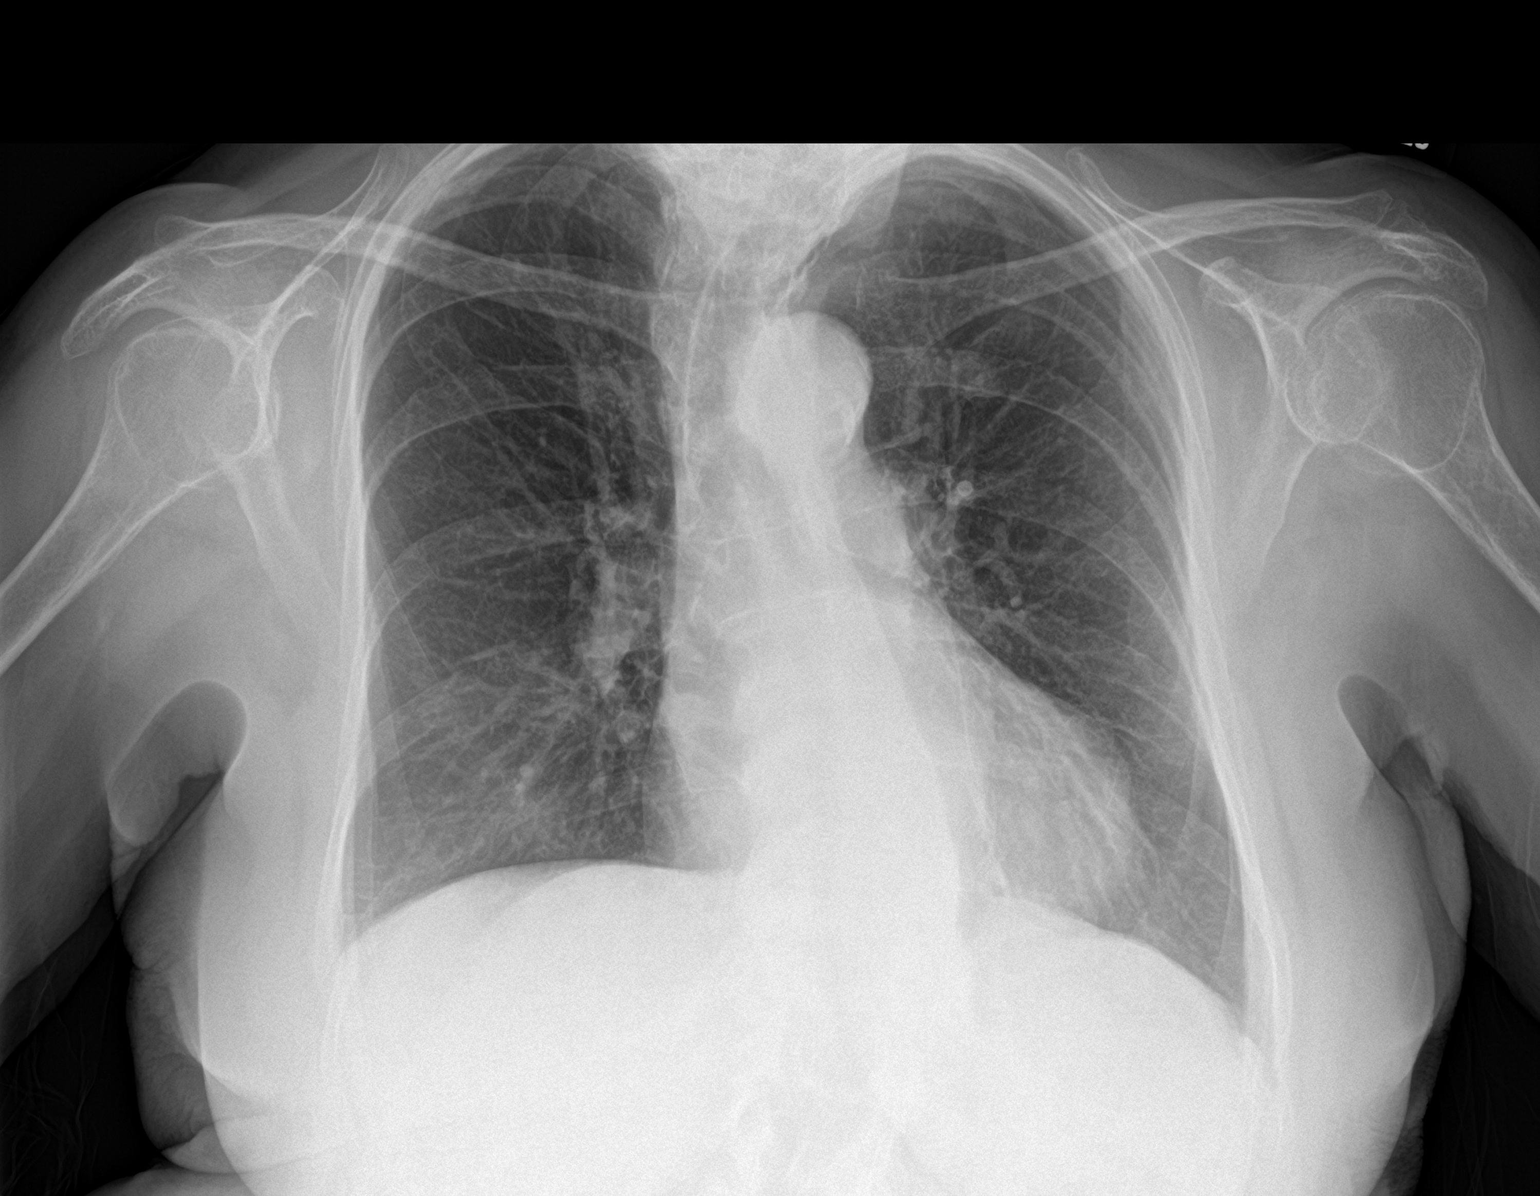

[chest lat]
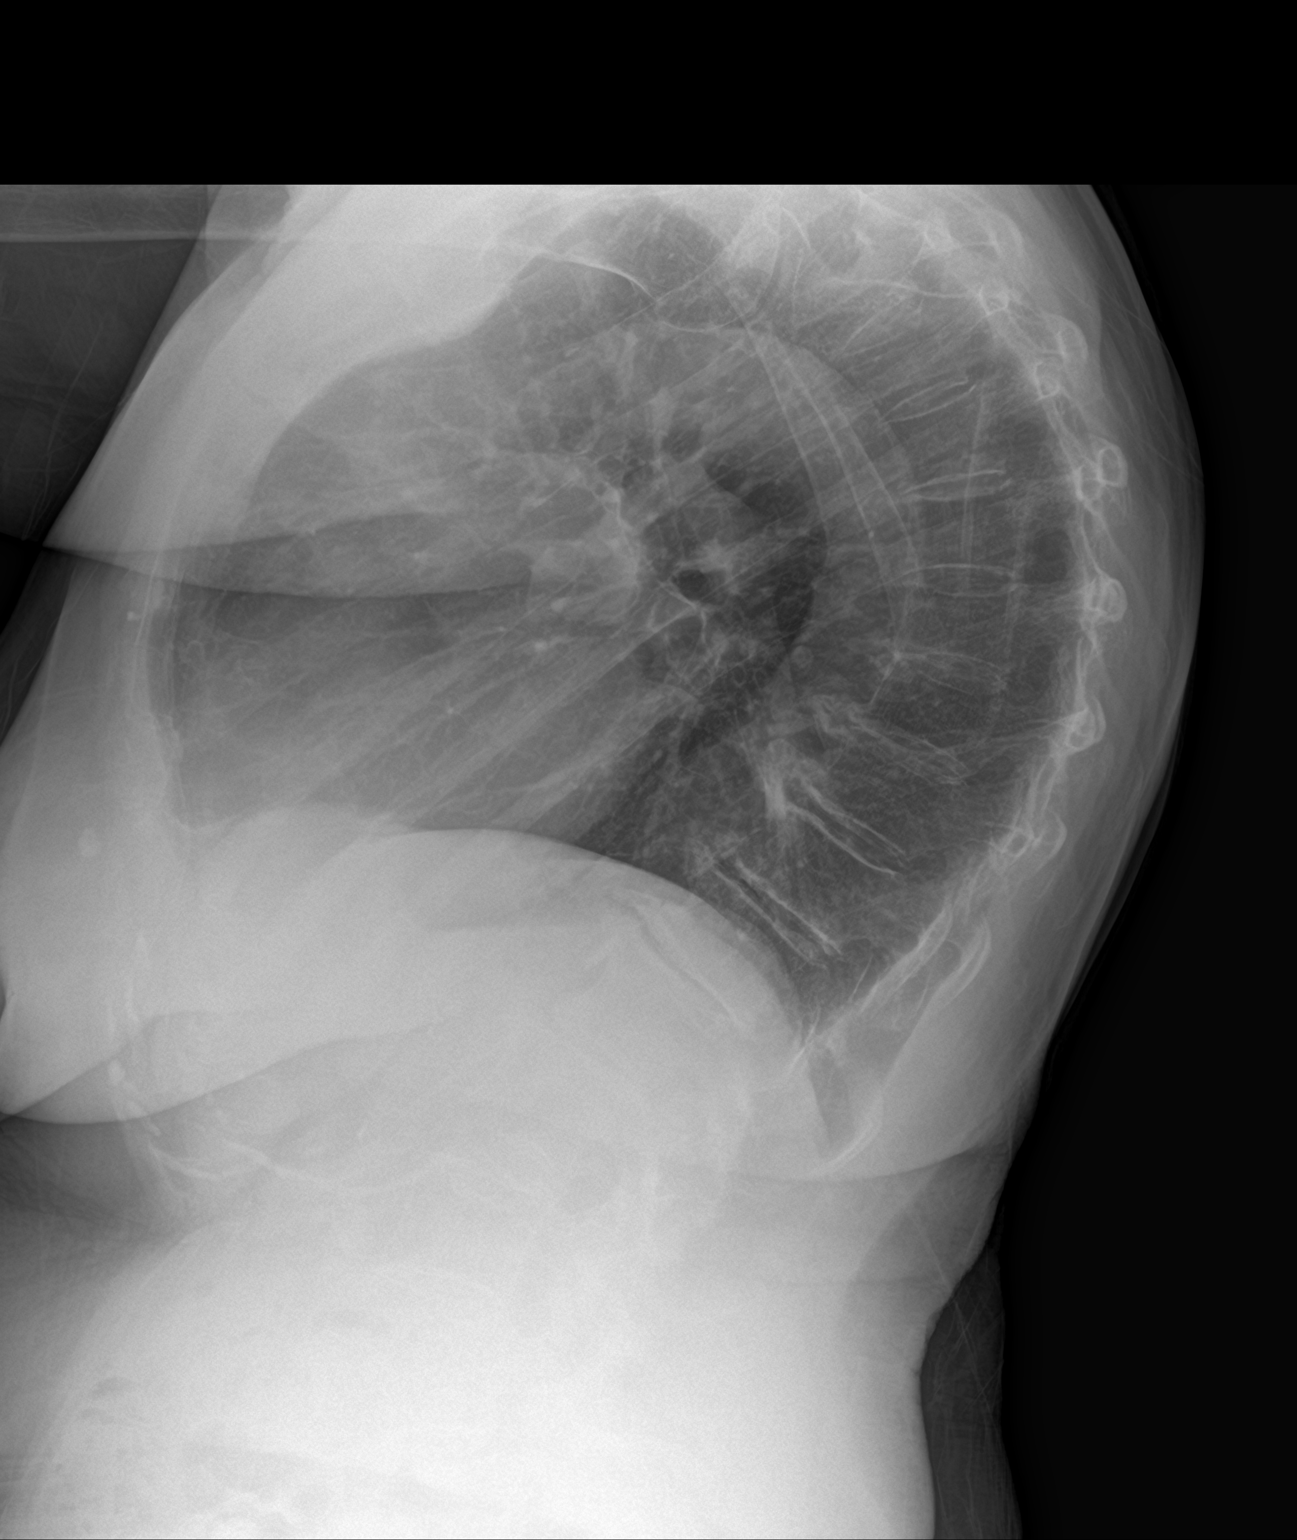

[2 of 2 positions shown; findings below may reference images not displayed]

FINDINGS: Stable cardiomediastinal contours with normal heart size. The lungs
are clear. No pneumothorax or pleural effusion. No acute findings in
the visualized skeleton.
IMPRESSION: No acute cardiopulmonary process.

## 2019-12-07 ENCOUNTER — Other Ambulatory Visit: Payer: Self-pay

## 2019-12-07 ENCOUNTER — Encounter: Payer: Self-pay | Admitting: Nurse Practitioner

## 2019-12-07 ENCOUNTER — Ambulatory Visit (INDEPENDENT_AMBULATORY_CARE_PROVIDER_SITE_OTHER): Payer: Medicare Other | Admitting: Nurse Practitioner

## 2019-12-07 VITALS — BP 103/59 | HR 52 | Temp 97.2°F | Resp 20 | Ht 64.0 in | Wt 155.0 lb

## 2019-12-07 DIAGNOSIS — I25111 Atherosclerotic heart disease of native coronary artery with angina pectoris with documented spasm: Secondary | ICD-10-CM

## 2019-12-07 DIAGNOSIS — M25551 Pain in right hip: Secondary | ICD-10-CM

## 2019-12-07 DIAGNOSIS — M170 Bilateral primary osteoarthritis of knee: Secondary | ICD-10-CM | POA: Diagnosis not present

## 2019-12-07 MED ORDER — TRAMADOL HCL 50 MG PO TABS: 50.0000 mg | ORAL_TABLET | Freq: Two times a day (BID) | ORAL | 2 refills | Status: DC

## 2019-12-07 NOTE — Progress Notes (Signed)
Subjective:    Patient ID: Teresa Holloway, female    DOB: 1940/09/25, 79 y.o.   MRN: [id number]   Chief Complaint: Medical Management of Chronic Issues   HPI Patient comes in today for pain mangement. Pain assessment: Cause of pain- arthritis bil knees Pain location- bil knees-has had TKR bil Pain on scale of 1-10- 6/10 currently Frequency- daily What increases pain-to much walking or standing What makes pain Better-rest Effects on ADL - none Any change in general medical condition-none  Current opioids rx- tramadol 50mg  BID # meds rx- 60 Effectiveness of current meds-helps make pain tolerable Adverse reactions form pain meds-none Morphine equivalent-  Pill count performed-No Last drug screen - 12/10/18 ( high risk q59m, moderate risk q90m, low risk yearly ) Urine drug screen today- No Was the NCCSR reviewed- yes  If yes were their any concerning findings? - no   Overdose risk: 1  Pain contract signed on:06/16/19    Review of Systems  Constitutional: Negative for diaphoresis.  Eyes: Negative for pain.  Respiratory: Negative for shortness of breath.   Cardiovascular: Negative for chest pain, palpitations and leg swelling.  Gastrointestinal: Negative for abdominal pain.  Endocrine: Negative for polydipsia.  Musculoskeletal: Positive for arthralgias (bil knees).  Skin: Negative for rash.  Neurological: Negative for dizziness, weakness and headaches.  Hematological: Does not bruise/bleed easily.  All other systems reviewed and are negative.      Objective:   Physical Exam Vitals and nursing note reviewed.  Constitutional:      Appearance: Normal appearance.  Cardiovascular:     Rate and Rhythm: Normal rate and regular rhythm.     Heart sounds: Normal heart sounds.  Pulmonary:     Breath sounds: Normal breath sounds.  Musculoskeletal:     Comments: Gait slow and steady No knee effusion noted  Skin:    General: Skin is warm.  Neurological:      General: No focal deficit present.     Mental Status: She is alert and oriented to person, place, and time.  Psychiatric:        Behavior: Behavior normal.    BP (!) 103/59    Pulse (!) 52    Temp (!) 97.2 F (36.2 C) (Temporal)    Resp 20    Ht 5\' 4"  (1.626 m)    Wt 155 lb (70.3 kg)    SpO2 96%    BMI 26.61 kg/m         Assessment & Plan:  Teresa Holloway in today with chief complaint of Medical Management of Chronic Issues   1. Arthritis of both knees  2. Right hip pain Moist heat Rest  Follow  Up in 3 months - traMADol (ULTRAM) 50 MG tablet; Take 1 tablet (50 mg total) by mouth 2 (two) times daily.  Dispense: 60 tablet; Refill: 2    The above assessment and management plan was discussed with the patient. The patient verbalized understanding of and has agreed to the management plan. Patient is aware to call the clinic if symptoms persist or worsen. Patient is aware when to return to the clinic for a follow-up visit. Patient educated on when it is appropriate to go to the emergency department.   Mary-Margaret Daphine Deutscher, FNP

## 2019-12-16 DIAGNOSIS — M7061 Trochanteric bursitis, right hip: Secondary | ICD-10-CM | POA: Diagnosis not present

## 2019-12-30 ENCOUNTER — Other Ambulatory Visit: Payer: Self-pay | Admitting: Nurse Practitioner

## 2019-12-30 DIAGNOSIS — M25551 Pain in right hip: Secondary | ICD-10-CM

## 2019-12-31 ENCOUNTER — Telehealth: Payer: Self-pay | Admitting: Nurse Practitioner

## 2019-12-31 NOTE — Telephone Encounter (Signed)
°  Prescription Request  12/31/2019  What is the name of the medication or equipment? Tramadol 50 mg. Was called in by pharmacy and denied and had appt for pain management 8-16  Have you contacted your pharmacy to request a refill? (if applicable) Yes  Which pharmacy would you like this sent to? [facility] in [place]   Patient notified that their request is being sent to the clinical staff for review and that they should receive a response within 2 business days.

## 2020-01-04 ENCOUNTER — Other Ambulatory Visit: Payer: Self-pay | Admitting: Nurse Practitioner

## 2020-01-04 DIAGNOSIS — M25551 Pain in right hip: Secondary | ICD-10-CM

## 2020-01-07 ENCOUNTER — Other Ambulatory Visit: Payer: Self-pay | Admitting: Nurse Practitioner

## 2020-01-07 DIAGNOSIS — I25111 Atherosclerotic heart disease of native coronary artery with angina pectoris with documented spasm: Secondary | ICD-10-CM

## 2020-01-17 ENCOUNTER — Other Ambulatory Visit: Payer: Self-pay | Admitting: Nurse Practitioner

## 2020-01-17 DIAGNOSIS — E785 Hyperlipidemia, unspecified: Secondary | ICD-10-CM

## 2020-02-02 DIAGNOSIS — H25812 Combined forms of age-related cataract, left eye: Secondary | ICD-10-CM | POA: Diagnosis not present

## 2020-02-02 DIAGNOSIS — H401421 Capsular glaucoma with pseudoexfoliation of lens, left eye, mild stage: Secondary | ICD-10-CM | POA: Diagnosis not present

## 2020-02-02 DIAGNOSIS — H401412 Capsular glaucoma with pseudoexfoliation of lens, right eye, moderate stage: Secondary | ICD-10-CM | POA: Diagnosis not present

## 2020-02-02 DIAGNOSIS — H25811 Combined forms of age-related cataract, right eye: Secondary | ICD-10-CM | POA: Diagnosis not present

## 2020-02-07 ENCOUNTER — Other Ambulatory Visit: Payer: Self-pay | Admitting: Nurse Practitioner

## 2020-02-07 DIAGNOSIS — I10 Essential (primary) hypertension: Secondary | ICD-10-CM

## 2020-02-08 ENCOUNTER — Other Ambulatory Visit: Payer: Self-pay | Admitting: Nurse Practitioner

## 2020-02-08 DIAGNOSIS — I25111 Atherosclerotic heart disease of native coronary artery with angina pectoris with documented spasm: Secondary | ICD-10-CM

## 2020-02-25 ENCOUNTER — Telehealth: Payer: Medicare Other

## 2020-02-29 ENCOUNTER — Telehealth: Payer: Self-pay | Admitting: *Deleted

## 2020-02-29 ENCOUNTER — Telehealth: Payer: Medicare Other | Admitting: *Deleted

## 2020-03-01 NOTE — Telephone Encounter (Signed)
Chronic Care Management   Outreach Note  02/29/2020 Name: Teresa Holloway MRN: [id number] DOB: Sep 03, 1940  Referred by: Bennie Pierini, FNP Reason for referral : Chronic Care Management   An unsuccessful Initial Telephone Visit was attempted today. The patient was referred to the case management team for assistance with care management and care coordination.   Clinical Goals: . Over the next 30 days, patient will be contacted by a Care Guide to reschedule their Initial CCM Visit . Over the next 30 days, patient will have an Initial CCM Visit with a member of the embedded CCM team to discuss self-management of their chronic medical conditions  Interventions and Plan . Chart reviewed in preparation for initial visit telephone call . Collaboration with other care team members as needed . Unsuccessful outreach to patient  . Request sent to care guides to reach out and reschedule patient's initial visit   Demetrios Loll, BSN, RN-BC Embedded Chronic Care Manager [facility] / [facility] Care Management Direct Dial: [phone]

## 2020-03-07 ENCOUNTER — Ambulatory Visit (INDEPENDENT_AMBULATORY_CARE_PROVIDER_SITE_OTHER): Payer: Medicare Other | Admitting: Nurse Practitioner

## 2020-03-07 ENCOUNTER — Encounter: Payer: Self-pay | Admitting: Nurse Practitioner

## 2020-03-07 ENCOUNTER — Other Ambulatory Visit: Payer: Self-pay

## 2020-03-07 VITALS — BP 118/65 | HR 55 | Temp 97.4°F | Resp 20 | Ht 64.0 in | Wt 154.0 lb

## 2020-03-07 DIAGNOSIS — Z23 Encounter for immunization: Secondary | ICD-10-CM

## 2020-03-07 DIAGNOSIS — K219 Gastro-esophageal reflux disease without esophagitis: Secondary | ICD-10-CM

## 2020-03-07 DIAGNOSIS — I25111 Atherosclerotic heart disease of native coronary artery with angina pectoris with documented spasm: Secondary | ICD-10-CM

## 2020-03-07 DIAGNOSIS — M1612 Unilateral primary osteoarthritis, left hip: Secondary | ICD-10-CM

## 2020-03-07 DIAGNOSIS — M810 Age-related osteoporosis without current pathological fracture: Secondary | ICD-10-CM

## 2020-03-07 DIAGNOSIS — E785 Hyperlipidemia, unspecified: Secondary | ICD-10-CM | POA: Diagnosis not present

## 2020-03-07 DIAGNOSIS — I10 Essential (primary) hypertension: Secondary | ICD-10-CM

## 2020-03-07 DIAGNOSIS — F3342 Major depressive disorder, recurrent, in full remission: Secondary | ICD-10-CM

## 2020-03-07 DIAGNOSIS — M170 Bilateral primary osteoarthritis of knee: Secondary | ICD-10-CM

## 2020-03-07 DIAGNOSIS — F1120 Opioid dependence, uncomplicated: Secondary | ICD-10-CM

## 2020-03-07 DIAGNOSIS — Z6830 Body mass index (BMI) 30.0-30.9, adult: Secondary | ICD-10-CM

## 2020-03-07 DIAGNOSIS — M25551 Pain in right hip: Secondary | ICD-10-CM

## 2020-03-07 LAB — LIPID PANEL

## 2020-03-07 MED ORDER — ATORVASTATIN CALCIUM 40 MG PO TABS: 40.0000 mg | ORAL_TABLET | Freq: Every day | ORAL | 0 refills | Status: DC

## 2020-03-07 MED ORDER — BENAZEPRIL HCL 40 MG PO TABS: 40.0000 mg | ORAL_TABLET | Freq: Every day | ORAL | 1 refills | Status: DC

## 2020-03-07 MED ORDER — CLOPIDOGREL BISULFATE 75 MG PO TABS: 75.0000 mg | ORAL_TABLET | Freq: Every day | ORAL | 1 refills | Status: DC

## 2020-03-07 MED ORDER — ISOSORBIDE MONONITRATE ER 60 MG PO TB24: ORAL_TABLET | ORAL | 1 refills | Status: DC

## 2020-03-07 MED ORDER — TRAMADOL HCL 50 MG PO TABS: 50.0000 mg | ORAL_TABLET | Freq: Two times a day (BID) | ORAL | 2 refills | Status: DC

## 2020-03-07 MED ORDER — AMLODIPINE BESYLATE 10 MG PO TABS: 10.0000 mg | ORAL_TABLET | Freq: Every day | ORAL | 1 refills | Status: DC

## 2020-03-07 MED ORDER — PAROXETINE HCL 40 MG PO TABS: 40.0000 mg | ORAL_TABLET | Freq: Every day | ORAL | 1 refills | Status: DC

## 2020-03-07 MED ORDER — HYDROCHLOROTHIAZIDE 25 MG PO TABS: 25.0000 mg | ORAL_TABLET | Freq: Every day | ORAL | 1 refills | Status: DC

## 2020-03-07 NOTE — Progress Notes (Signed)
Subjective:    Patient ID: Teresa Holloway, female    DOB: [birth year], 79 y.o.   MRN: [id number]   Chief Complaint: medical management of chronic issues     HPI:  1. Essential hypertension, benign No c/o chest pain, sob or headache. Does not check blood pressure at home. BP Readings from Last 3 Encounters:  12/07/19 (!) 103/59  09/16/19 (!) 97/57  06/15/19 (!) 103/56     2. Coronary artery disease involving native coronary artery of native heart with angina pectoris with documented spasm [facility]) Had visit with cardiology in June 2021. She was in hospital with chest pain. All test were good she was told to continue all current meds, low carb diet and exercise. She was told to follow up with them in 1 year.  3. Hyperlipidemia with target LDL less than 100 Does try to watch diet but does no exercise. Lab Results  Component Value Date   CHOL 124 09/16/2019   HDL 69 09/16/2019   LDLCALC 41 09/16/2019   TRIG 70 09/16/2019   CHOLHDL 1.8 09/16/2019     4. Gastroesophageal reflux disease without esophagitis Currently on no prescription meds for this.  5. Age related osteoporosis, unspecified pathological fracture presence Last dexascan was done in 2015. She has been on fosamax for several years. She has not been taking for several months. Refuses repeat bone density test.  6. Primary osteoarthritis of left hip Has constant pain in hip as well as knees.- she was told by ortho that she had bursitis,  bt she says pain is worsening when she is walking. xrays were done at ortho office so report is not in chart.-see pain management below.  7. Arthritis of both knees Pain assessment: Cause of pain- osteoarthritis Pain location- hip and knees Pain on scale of 1-10- 7/10 in hip  Frequency- daily What increases pain-walking What makes pain Better-seating Effects on ADL - none Any change in general medical condition-none  Current opioids rx- ultram 50 bid # meds rx-  60 Effectiveness of current meds-helps Adverse reactions from pain meds-none Morphine equivalent-  Pill count performed-No Last drug screen - never ( high risk q75m, moderate risk q59m, low risk yearly ) Urine drug screen today- Yes Was the NCCSR reviewed- yes  If yes were their any concerning findings? - no   Overdose risk: 1  Pain contract signed on: 06/16/19   8. Recurrent major depressive disorder, in full remission (HCC) Has been on paxil for several years. Says she is doing well. Depression screen [facility] 2/9 03/07/2020 12/07/2019 06/15/2019  Decreased Interest 2 0 0  Down, Depressed, Hopeless 0 0 0  PHQ - 2 Score 2 0 0  Altered sleeping 0 0 -  Tired, decreased energy 1 0 -  Change in appetite 0 0 -  Feeling bad or failure about yourself  0 0 -  Trouble concentrating 0 0 -  Moving slowly or fidgety/restless 0 0 -  Suicidal thoughts 0 0 -  PHQ-9 Score 3 0 -  Difficult doing work/chores Not difficult at all Not difficult at all -  Some recent data might be hidden    9. BMI 30.0-30.9,adult No recent weight changes Wt Readings from Last 3 Encounters:  03/07/20 154 lb (69.9 kg)  12/07/19 155 lb (70.3 kg)  09/16/19 155 lb (70.3 kg)   BMI Readings from Last 3 Encounters:  03/07/20 26.43 kg/m  12/07/19 26.61 kg/m  09/16/19 26.61 kg/m     10. Opioid type  dependence, continuous (HCC) Has beenon pain meds for several years. No side effects noted.    Outpatient Encounter Medications as of 03/07/2020  Medication Sig  . alendronate (FOSAMAX) 70 MG tablet TAKE 1 TABLE BY MOUTH EVERY 7 DAYS. TAKE WITH A FULL GLASS OF WATER ON AN EMPTY STOMACH.  Marland Kitchen amLODipine (NORVASC) 10 MG tablet TAKE 1 TABLET BY MOUTH EVERY DAY  . aspirin EC 81 MG tablet Take 1 tablet (81 mg total) by mouth 2 (two) times daily.  Marland Kitchen atorvastatin (LIPITOR) 40 MG tablet TAKE 1 TABLET (40 MG TOTAL) BY MOUTH DAILY AT 6 PM.  . benazepril (LOTENSIN) 40 MG tablet Take 1 tablet (40 mg total) by mouth daily.   . cholecalciferol (VITAMIN D) 1000 UNITS tablet Take 1,000 Units by mouth daily.  . clopidogrel (PLAVIX) 75 MG tablet TAKE 1 TABLET BY MOUTH EVERY DAY  . diclofenac sodium (VOLTAREN) 1 % GEL Apply 2 g topically 4 (four) times daily. (Patient taking differently: Apply 2 g topically 4 (four) times daily as needed (pain). )  . hydrochlorothiazide (HYDRODIURIL) 25 MG tablet TAKE 1 TABLET BY MOUTH EVERY DAY  . isosorbide mononitrate (IMDUR) 60 MG 24 hr tablet TAKE 1 TABLET BY MOUTH EVERYDAY AT BEDTIME  . latanoprost (XALATAN) 0.005 % ophthalmic solution Place 1 drop into both eyes at bedtime.  . metoprolol succinate (TOPROL-XL) 50 MG 24 hr tablet Take 1 tablet (50 mg total) by mouth daily. Take with or immediately following a meal.  . nitroGLYCERIN (NITROSTAT) 0.4 MG SL tablet Place 1 tablet (0.4 mg total) under the tongue every 5 (five) minutes as needed for chest pain.  Marland Kitchen PARoxetine (PAXIL) 40 MG tablet Take 1 tablet (40 mg total) by mouth at bedtime.  . traMADol (ULTRAM) 50 MG tablet Take 1 tablet (50 mg total) by mouth 2 (two) times daily.     Past Surgical History:  Procedure Laterality Date  . ECTOPIC PREGNANCY SURGERY    . left breast biospy    . left wrist fracture    . TOTAL KNEE ARTHROPLASTY Left 01/25/2019   Procedure: LEFT TOTAL KNEE ARTHROPLASTY;  Surgeon: Gean Birchwood, MD;  Location: [facility];  Service: Orthopedics;  Laterality: Left;    Family History  Problem Relation Age of Onset  . Asthma Father   . Diabetes Sister   . Diabetes Brother     New complaints: None other then hip pain worsening- has follow up with ortho in 1 month  Social history: Lives with husband     Review of Systems  Constitutional: Negative for diaphoresis.  Eyes: Negative for pain.  Respiratory: Negative for shortness of breath.   Cardiovascular: Negative for chest pain, palpitations and leg swelling.  Gastrointestinal: Negative for abdominal pain.  Endocrine: Negative for polydipsia.   Musculoskeletal: Positive for arthralgias (left hip an knee).  Skin: Negative for rash.  Neurological: Negative for dizziness, weakness and headaches.  Hematological: Does not bruise/bleed easily.  All other systems reviewed and are negative.      Objective:   Physical Exam Vitals and nursing note reviewed.  Constitutional:      General: She is not in acute distress.    Appearance: Normal appearance. She is well-developed.  HENT:     Head: Normocephalic.     Nose: Nose normal.  Eyes:     Pupils: Pupils are equal, round, and reactive to light.  Neck:     Vascular: No carotid bruit or JVD.  Cardiovascular:     Rate and Rhythm:  Normal rate and regular rhythm.     Heart sounds: Normal heart sounds.  Pulmonary:     Effort: Pulmonary effort is normal. No respiratory distress.     Breath sounds: Normal breath sounds. No wheezing or rales.  Chest:     Chest wall: No tenderness.  Abdominal:     General: Bowel sounds are normal. There is no distension or abdominal bruit.     Palpations: Abdomen is soft. There is no hepatomegaly, splenomegaly, mass or pulsatile mass.     Tenderness: There is no abdominal tenderness.  Musculoskeletal:        General: Normal range of motion.     Cervical back: Normal range of motion and neck supple.     Comments: Walking with limp on left side  Lymphadenopathy:     Cervical: No cervical adenopathy.  Skin:    General: Skin is warm and dry.  Neurological:     Mental Status: She is alert and oriented to person, place, and time.     Deep Tendon Reflexes: Reflexes are normal and symmetric.  Psychiatric:        Behavior: Behavior normal.        Thought Content: Thought content normal.        Judgment: Judgment normal.   BP 118/65   Pulse (!) 55   Temp (!) 97.4 F (36.3 C) (Temporal)   Resp 20   Ht 5\' 4"  (1.626 m)   Wt 154 lb (69.9 kg)   SpO2 97%   BMI 26.43 kg/m          Assessment & Plan:  WYLLA MEDLEY comes in today with chief  complaint of Medical Management of Chronic Issues   Diagnosis and orders addressed:  1. Essential hypertension, benign Low sodium diet - hydrochlorothiazide (HYDRODIURIL) 25 MG tablet; Take 1 tablet (25 mg total) by mouth daily.  Dispense: 90 tablet; Refill: 1 - amLODipine (NORVASC) 10 MG tablet; Take 1 tablet (10 mg total) by mouth daily.  Dispense: 90 tablet; Refill: 1 - benazepril (LOTENSIN) 40 MG tablet; Take 1 tablet (40 mg total) by mouth daily.  Dispense: 90 tablet; Refill: 1 - CBC with Differential/Platelet - CMP14+EGFR  2. Coronary artery disease involving native coronary artery of native heart with angina pectoris with documented spasm (HCC) Yearly follow up with cardiology - isosorbide mononitrate (IMDUR) 60 MG 24 hr tablet; TAKE 1 TABLET BY MOUTH EVERYDAY AT BEDTIME  Dispense: 90 tablet; Refill: 1 - clopidogrel (PLAVIX) 75 MG tablet; Take 1 tablet (75 mg total) by mouth daily.  Dispense: 90 tablet; Refill: 1  3. Hyperlipidemia with target LDL less than 100 Low fat diet - atorvastatin (LIPITOR) 40 MG tablet; Take 1 tablet (40 mg total) by mouth daily at 6 PM.  Dispense: 90 tablet; Refill: 0 - Lipid panel  4. Gastroesophageal reflux disease without esophagitis Avoid spicy foods Do not eat 2 hours prior to bedtime   5. Age related osteoporosis, unspecified pathological fracture presence Weight bearing exercise when can tolerate  6. Primary osteoarthritis of left hip Keep follow up with ortho as schedule  7. Arthritis of both knees  8. Recurrent major depressive disorder, in full remission (HCC) Stress management - PARoxetine (PAXIL) 40 MG tablet; Take 1 tablet (40 mg total) by mouth at bedtime.  Dispense: 90 tablet; Refill: 1  9. BMI 30.0-30.9,adult Discussed diet and exercise for person with BMI >25 Will recheck weight in 3-6 months  10. Opioid type dependence, continuous (HCC)  11. Right  hip pain - traMADol (ULTRAM) 50 MG tablet; Take 1 tablet (50 mg total)  by mouth 2 (two) times daily.  Dispense: 60 tablet; Refill: 2   Labs pending Health Maintenance reviewed Diet and exercise encouraged  Follow up plan: 3 months   Mary-Margaret Daphine Deutscher, FNP

## 2020-03-07 NOTE — Addendum Note (Signed)
Addended by: Chevis Pretty on: 03/07/2020 08:51 AM   Modules accepted: Orders

## 2020-03-07 NOTE — Patient Instructions (Signed)

## 2020-03-08 LAB — CMP14+EGFR
ALT: 7 IU/L (ref 0–32)
AST: 10 IU/L (ref 0–40)
Albumin/Globulin Ratio: 2.3 — ABNORMAL HIGH (ref 1.2–2.2)
Albumin: 4.6 g/dL (ref 3.7–4.7)
Alkaline Phosphatase: 99 IU/L (ref 44–121)
BUN/Creatinine Ratio: 16 (ref 12–28)
BUN: 18 mg/dL (ref 8–27)
Bilirubin Total: 0.8 mg/dL (ref 0.0–1.2)
CO2: 28 mmol/L (ref 20–29)
Calcium: 10.3 mg/dL (ref 8.7–10.3)
Chloride: 102 mmol/L (ref 96–106)
Creatinine, Ser: 1.15 mg/dL — ABNORMAL HIGH (ref 0.57–1.00)
GFR calc Af Amer: 53 mL/min/{1.73_m2} — ABNORMAL LOW (ref >59)
GFR calc non Af Amer: 46 mL/min/{1.73_m2} — ABNORMAL LOW (ref >59)
Globulin, Total: 2 g/dL (ref 1.5–4.5)
Glucose: 94 mg/dL (ref 65–99)
Potassium: 3.9 mmol/L (ref 3.5–5.2)
Sodium: 143 mmol/L (ref 134–144)
Total Protein: 6.6 g/dL (ref 6.0–8.5)

## 2020-03-08 LAB — CBC WITH DIFFERENTIAL/PLATELET
Basophils Absolute: 0.1 10*3/uL (ref 0.0–0.2)
Basos: 2 %
EOS (ABSOLUTE): 0.2 10*3/uL (ref 0.0–0.4)
Eos: 3 %
Hematocrit: 36.5 % (ref 34.0–46.6)
Hemoglobin: 11.9 g/dL (ref 11.1–15.9)
Immature Grans (Abs): 0 10*3/uL (ref 0.0–0.1)
Immature Granulocytes: 0 %
Lymphocytes Absolute: 2.3 10*3/uL (ref 0.7–3.1)
Lymphs: 32 %
MCH: 30.3 pg (ref 26.6–33.0)
MCHC: 32.6 g/dL (ref 31.5–35.7)
MCV: 93 fL (ref 79–97)
Monocytes Absolute: 0.8 10*3/uL (ref 0.1–0.9)
Monocytes: 11 %
Neutrophils Absolute: 3.8 10*3/uL (ref 1.4–7.0)
Neutrophils: 52 %
Platelets: 261 10*3/uL (ref 150–450)
RBC: 3.93 x10E6/uL (ref 3.77–5.28)
RDW: 12.4 % (ref 11.7–15.4)
WBC: 7.3 10*3/uL (ref 3.4–10.8)

## 2020-03-08 LAB — LIPID PANEL
Chol/HDL Ratio: 2 ratio (ref 0.0–4.4)
Cholesterol, Total: 142 mg/dL (ref 100–199)
HDL: 72 mg/dL (ref >39)
LDL Chol Calc (NIH): 54 mg/dL (ref 0–99)
Triglycerides: 87 mg/dL (ref 0–149)
VLDL Cholesterol Cal: 16 mg/dL (ref 5–40)

## 2020-03-13 LAB — TOXASSURE SELECT 13 (MW), URINE

## 2020-03-23 ENCOUNTER — Telehealth: Payer: Self-pay | Admitting: *Deleted

## 2020-03-23 NOTE — Chronic Care Management (AMB) (Signed)
Care Management   Note  03/23/2020 Name: Teresa Holloway MRN: [id number] DOB: [birth year]  Teresa Holloway is a 79 y.o. year old female who is a primary care patient of Bennie Pierini, FNP and is actively engaged with the care management team. I reached out to [name] by phone today to assist with re-scheduling an initial visit with the RN Case Manager.  Follow up plan: Unsuccessful telephone outreach attempt made. The care management team will reach out to the patient again over the next 7 days. If patient returns call to provider office, please advise to call Embedded Care Management Care Guide Gwenevere Ghazi at [phone].  Gwenevere Ghazi  Care Guide, Embedded Care Coordination [facility] Care Management

## 2020-03-24 NOTE — Telephone Encounter (Signed)
R/s

## 2020-03-27 ENCOUNTER — Telehealth: Payer: Self-pay

## 2020-03-27 NOTE — Telephone Encounter (Signed)
Ok to work patient in

## 2020-03-27 NOTE — Telephone Encounter (Signed)
Patient states she has a knot on her foot that has been there x 3 days and would like to know if MMM can work her in.  Aware there are no openings.

## 2020-03-28 ENCOUNTER — Telehealth: Payer: Self-pay

## 2020-03-28 ENCOUNTER — Encounter: Payer: Self-pay | Admitting: Nurse Practitioner

## 2020-03-28 ENCOUNTER — Ambulatory Visit (INDEPENDENT_AMBULATORY_CARE_PROVIDER_SITE_OTHER): Payer: Medicare Other | Admitting: Nurse Practitioner

## 2020-03-28 ENCOUNTER — Other Ambulatory Visit: Payer: Self-pay

## 2020-03-28 ENCOUNTER — Ambulatory Visit (INDEPENDENT_AMBULATORY_CARE_PROVIDER_SITE_OTHER): Payer: Medicare Other

## 2020-03-28 VITALS — BP 129/68 | HR 59 | Temp 97.3°F | Resp 20 | Ht 64.0 in | Wt 153.0 lb

## 2020-03-28 DIAGNOSIS — I25111 Atherosclerotic heart disease of native coronary artery with angina pectoris with documented spasm: Secondary | ICD-10-CM | POA: Diagnosis not present

## 2020-03-28 DIAGNOSIS — M79672 Pain in left foot: Secondary | ICD-10-CM | POA: Diagnosis not present

## 2020-03-28 NOTE — Telephone Encounter (Signed)
Lmtcb- Per Leafy Ro patient can get worked in at [facility]

## 2020-03-28 NOTE — Progress Notes (Signed)
Subjective:    Patient ID: Teresa Holloway, female    DOB: Dec 19, 1940, 79 y.o.   MRN: [id number]   Chief Complaint: knot on left foot (Painful and bruised)   HPI Patient come sin c/o painful knot on the top of her left foot. She noticed it late last week. She says it hurts to walk on. She has been using some pain spray from [facility] and that helps some.    Review of Systems  Constitutional: Negative for diaphoresis.  Eyes: Negative for pain.  Respiratory: Negative for shortness of breath.   Cardiovascular: Negative for chest pain, palpitations and leg swelling.  Gastrointestinal: Negative for abdominal pain.  Endocrine: Negative for polydipsia.  Musculoskeletal: Positive for arthralgias (left foot).  Skin: Negative for rash.  Neurological: Negative for dizziness, weakness and headaches.  Hematological: Does not bruise/bleed easily.  All other systems reviewed and are negative.      Objective:   Physical Exam Vitals and nursing note reviewed.  Constitutional:      Appearance: Normal appearance.  Cardiovascular:     Rate and Rhythm: Normal rate and regular rhythm.     Heart sounds: Normal heart sounds.  Pulmonary:     Breath sounds: Normal breath sounds.  Musculoskeletal:     Comments: Mild contusion on medial side of left foot with slight edema. Pain in palpation in area along 5th metatarsal.  Skin:    General: Skin is warm.  Neurological:     General: No focal deficit present.     Mental Status: She is alert and oriented to person, place, and time.  Psychiatric:        Mood and Affect: Mood normal.    BP 129/68   Pulse (!) 59   Temp (!) 97.3 F (36.3 C) (Temporal)   Resp 20   Ht 5\' 4"  (1.626 m)   Wt 153 lb (69.4 kg)   SpO2 93%   BMI 26.26 kg/m   Left foot xray- no fracture noted-Preliminary reading by Paulene Floor, FNP  [facility]      Assessment & Plan:  Teresa Holloway in today with chief complaint of knot on left foot (Painful and bruised)   1. Left  foot pain Ice BID Motrin or tylenol as needed RTO prn - DG Foot Complete Left    The above assessment and management plan was discussed with the patient. The patient verbalized understanding of and has agreed to the management plan. Patient is aware to call the clinic if symptoms persist or worsen. Patient is aware when to return to the clinic for a follow-up visit. Patient educated on when it is appropriate to go to the emergency department.   Mary-Margaret Daphine Deutscher, FNP

## 2020-03-28 NOTE — Telephone Encounter (Signed)
error 

## 2020-03-28 NOTE — Telephone Encounter (Signed)
Appointment scheduled.

## 2020-03-28 NOTE — Patient Instructions (Signed)
Contusion A contusion is a deep bruise. This is a result of an injury that causes bleeding under the skin. Symptoms of bruising include pain, swelling, and discolored skin. The skin may turn blue, purple, or yellow. Follow these instructions at home: Managing pain, stiffness, and swelling You may use RICE. This stands for:  Resting.  Icing.  Compression, or putting pressure.  Elevating, or raising the injured area. To follow this method, do these actions:  Rest the injured area.  If told, put ice on the injured area. ? Put ice in a plastic bag. ? Place a towel between your skin and the bag. ? Leave the ice on for 20 minutes, 2-3 times per day.  If told, put light pressure (compression) on the injured area using an elastic bandage. Make sure the bandage is not too tight. If the area tingles or becomes numb, remove it and put it back on as told by your doctor.  If possible, raise (elevate) the injured area above the level of your heart while you are sitting or lying down.  General instructions  Take over-the-counter and prescription medicines only as told by your doctor.  Keep all follow-up visits as told by your doctor. This is important. Contact a doctor if:  Your symptoms do not get better after several days of treatment.  Your symptoms get worse.  You have trouble moving the injured area. Get help right away if:  You have very bad pain.  You have a loss of feeling (numbness) in a hand or foot.  Your hand or foot turns pale or cold. Summary  A contusion is a deep bruise. This is a result of an injury that causes bleeding under the skin.  Symptoms of bruising include pain, swelling, and discolored skin. The skin may turn blue, purple, or yellow.  This condition is treated with rest, ice, compression, and elevation. This is also called RICE. You may be given over-the-counter medicines for pain.  Contact a doctor if you do not feel better, or you feel worse. Get  help right away if you have very bad pain, have lost feeling in a hand or foot, or the area turns pale or cold. This information is not intended to replace advice given to you by your health care provider. Make sure you discuss any questions you have with your health care provider. Document Revised: 11/28/2017 Document Reviewed: 11/28/2017 Elsevier Patient Education  [place].

## 2020-03-29 NOTE — Chronic Care Management (AMB) (Signed)
Care Management   Note  03/29/2020 Name: Teresa Holloway MRN: [id number] DOB: 01/23/1941  Teresa Holloway is a 79 y.o. year old female who is a primary care patient of Bennie Pierini, FNP and is actively engaged with the care management team. I reached out to [name] by phone today to assist with re-scheduling an initial visit with the RN Case Manager  Follow up plan: A second unsuccessful telephone outreach attempt made. A HIPAA compliant phone message was left for the patient providing contact information and requesting a return call. The care management team will reach out to the patient again over the next 7 days. If patient returns call to provider office, please advise to call Embedded Care Management Care Guide Gwenevere Ghazi at [phone].  Gwenevere Ghazi  Care Guide, Embedded Care Coordination [facility] Management

## 2020-04-04 NOTE — Chronic Care Management (AMB) (Signed)
Care Management   Note  04/04/2020 Name: Teresa Holloway MRN: [id number] DOB: 18-Oct-1940  Teresa Holloway is a 79 y.o. year old female who is a primary care patient of Bennie Pierini, FNP and is actively engaged with the care management team. I reached out to [name] by phone today to assist with re-scheduling an initial visit with the RN Case Manager  Follow up plan: Telephone appointment with care management team member scheduled for:04/28/2020  [facility] Guide, Embedded Care Coordination [facility]  Care Management

## 2020-04-04 NOTE — Telephone Encounter (Signed)
2nd attempt outreach rescheduled with Chong Sicilian RNCM for 04/28/2020

## 2020-04-13 ENCOUNTER — Other Ambulatory Visit: Payer: Self-pay

## 2020-04-13 ENCOUNTER — Ambulatory Visit (INDEPENDENT_AMBULATORY_CARE_PROVIDER_SITE_OTHER): Payer: Medicare Other

## 2020-04-13 ENCOUNTER — Encounter: Payer: Self-pay | Admitting: Nurse Practitioner

## 2020-04-13 ENCOUNTER — Ambulatory Visit (INDEPENDENT_AMBULATORY_CARE_PROVIDER_SITE_OTHER): Payer: Medicare Other | Admitting: Nurse Practitioner

## 2020-04-13 VITALS — BP 123/60 | HR 56 | Temp 97.4°F | Resp 20 | Ht 64.0 in | Wt 152.0 lb

## 2020-04-13 DIAGNOSIS — M1611 Unilateral primary osteoarthritis, right hip: Secondary | ICD-10-CM | POA: Diagnosis not present

## 2020-04-13 DIAGNOSIS — I25111 Atherosclerotic heart disease of native coronary artery with angina pectoris with documented spasm: Secondary | ICD-10-CM

## 2020-04-13 DIAGNOSIS — M25551 Pain in right hip: Secondary | ICD-10-CM

## 2020-04-13 DIAGNOSIS — M25561 Pain in right knee: Secondary | ICD-10-CM | POA: Diagnosis not present

## 2020-04-13 DIAGNOSIS — M1712 Unilateral primary osteoarthritis, left knee: Secondary | ICD-10-CM | POA: Diagnosis not present

## 2020-04-13 DIAGNOSIS — M25562 Pain in left knee: Secondary | ICD-10-CM

## 2020-04-13 DIAGNOSIS — W19XXXA Unspecified fall, initial encounter: Secondary | ICD-10-CM | POA: Diagnosis not present

## 2020-04-13 DIAGNOSIS — M1711 Unilateral primary osteoarthritis, right knee: Secondary | ICD-10-CM | POA: Diagnosis not present

## 2020-04-13 NOTE — Patient Instructions (Signed)

## 2020-04-13 NOTE — Progress Notes (Signed)
Subjective:    Patient ID: Teresa Holloway, female    DOB: Sep 18, 1940, 79 y.o.   MRN: [id number]   Chief Complaint: bilateral knee pain Larey Seat yesterday/)   HPI Patient fell yesterday leaving a restaurant. She said she landed on her butt. She says her right hip and both knees are hurting her.   Review of Systems  Constitutional: Negative for diaphoresis.  Eyes: Negative for pain.  Respiratory: Negative for shortness of breath.   Cardiovascular: Negative for chest pain, palpitations and leg swelling.  Gastrointestinal: Negative for abdominal pain.  Endocrine: Negative for polydipsia.  Musculoskeletal: Positive for arthralgias.  Skin: Negative for rash.  Neurological: Negative for dizziness, weakness and headaches.  Hematological: Does not bruise/bleed easily.  All other systems reviewed and are negative.      Objective:   Physical Exam Vitals and nursing note reviewed.  Constitutional:      Appearance: Normal appearance.  Cardiovascular:     Rate and Rhythm: Normal rate and regular rhythm.     Heart sounds: Normal heart sounds.  Pulmonary:     Breath sounds: Normal breath sounds.  Musculoskeletal:     Comments: FROM of bil knees with pain on full flexion FROM of right hip with pain on rotation.  Skin:    General: Skin is warm and dry.  Neurological:     General: No focal deficit present.     Mental Status: She is alert and oriented to person, place, and time.  Psychiatric:        Mood and Affect: Mood normal.        Behavior: Behavior normal.    BP 123/60   Pulse (!) 56   Temp (!) 97.4 F (36.3 C) (Temporal)   Resp 20   Ht 5\' 4"  (1.626 m)   Wt 152 lb (68.9 kg)   SpO2 98%   BMI 26.09 kg/m   Right knee no fracture- Preliminary reading by Paulene Floor, FNP  [facility]  left knee xray- no fracture- prosthesis intact-Preliminary reading by Paulene Floor, FNP  [facility] Right hip- chronic arthritic changes- no fracture-Preliminary reading by Paulene Floor, FNP   [facility]        Assessment & Plan:  Teresa Holloway in today with chief complaint of bilateral knee pain (Fell yesterday/)   1. Fall, initial encounter Tylenol OYTC as needed RTO prn - DG Knee 1-2 Views Left - DG Knee 1-2 Views Right - DG HIP UNILAT W OR W/O PELVIS 2-3 VIEWS RIGHT    The above assessment and management plan was discussed with the patient. The patient verbalized understanding of and has agreed to the management plan. Patient is aware to call the clinic if symptoms persist or worsen. Patient is aware when to return to the clinic for a follow-up visit. Patient educated on when it is appropriate to go to the emergency department.   Mary-Margaret Daphine Deutscher, FNP

## 2020-04-28 ENCOUNTER — Telehealth: Payer: Medicare Other | Admitting: *Deleted

## 2020-05-04 ENCOUNTER — Ambulatory Visit: Payer: PRIVATE HEALTH INSURANCE

## 2020-05-04 ENCOUNTER — Other Ambulatory Visit: Payer: PRIVATE HEALTH INSURANCE

## 2020-05-18 ENCOUNTER — Ambulatory Visit (INDEPENDENT_AMBULATORY_CARE_PROVIDER_SITE_OTHER): Payer: Medicare Other

## 2020-05-18 DIAGNOSIS — Z0000 Encounter for general adult medical examination without abnormal findings: Secondary | ICD-10-CM

## 2020-05-18 NOTE — Progress Notes (Signed)
MEDICARE ANNUAL WELLNESS VISIT  05/18/2020  Telephone Visit Disclaimer This Medicare AWV was conducted by telephone due to national recommendations for restrictions regarding the COVID-19 Pandemic (e.g. social distancing).  I verified, using two identifiers, that I am speaking with Teresa Holloway or their authorized healthcare agent. I discussed the limitations, risks, security, and privacy concerns of performing an evaluation and management service by telephone and the potential availability of an in-person appointment in the future. The patient expressed understanding and agreed to proceed.  Location of Patient: Home Location of Provider (nurse):  [facility]  Subjective:    Teresa Holloway is a 80 y.o. female patient of Bennie Pierini, FNP who had a Medicare Annual Wellness Visit today via telephone. Jolynne is Retired and lives with their spouse. She has one child, two grandchildren and two great grandchildren. She reports that she is socially active and does interact with friends/family regularly. She is minimally physically active and enjoys working in her yard and planting flowers.  Patient Care Team: Bennie Pierini, FNP as PCP - General (Nurse Practitioner) Gwenith Daily, RN as Registered Nurse  Advanced Directives 05/18/2020 01/25/2019 01/18/2019 09/18/2018 11/06/2015  Does Patient Have a Medical Advance Directive? No No No No No  Would patient like information on creating a medical advance directive? No - Patient declined No - Patient declined - No - Patient declined -    Hospital Utilization Over the Past 12 Months: # of hospitalizations or ER visits: 0 # of surgeries: 0  Review of Systems    Patient reports that her overall health is unchanged compared to last year.  History obtained from chart review and the patient  Patient Reported Readings (BP, Pulse, CBG, Weight, etc) none  Pain Assessment Pain Score: 6  Pain Location: Hip Pain Orientation:  Right,Left Pain Descriptors / Indicators: Aching,Burning,Constant Pain Onset: More than a month ago Pain Frequency: Constant Pain Relieving Factors: Tramadol  Pain Relieving Factors: Tramadol  Current Medications & Allergies (verified) Allergies as of 05/18/2020      Reactions   Atarax [hydroxyzine] Nausea And Vomiting   Bactrim [sulfamethoxazole-trimethoprim] Nausea Only   Coreg [carvedilol] Other (See Comments)   Unknown      Medication List       Accurate as of May 18, 2020  1:25 PM. If you have any questions, ask your nurse or doctor.        amLODipine 10 MG tablet Commonly known as: NORVASC Take 1 tablet (10 mg total) by mouth daily.   aspirin EC 81 MG tablet Take 1 tablet (81 mg total) by mouth 2 (two) times daily.   atorvastatin 40 MG tablet Commonly known as: LIPITOR Take 1 tablet (40 mg total) by mouth daily at 6 PM.   benazepril 40 MG tablet Commonly known as: LOTENSIN Take 1 tablet (40 mg total) by mouth daily.   cholecalciferol 1000 units tablet Commonly known as: VITAMIN D Take 1,000 Units by mouth daily.   clopidogrel 75 MG tablet Commonly known as: PLAVIX Take 1 tablet (75 mg total) by mouth daily.   diclofenac sodium 1 % Gel Commonly known as: VOLTAREN Apply 2 g topically 4 (four) times daily. What changed:   when to take this  reasons to take this   hydrochlorothiazide 25 MG tablet Commonly known as: HYDRODIURIL Take 1 tablet (25 mg total) by mouth daily.   isosorbide mononitrate 60 MG 24 hr tablet Commonly known as: IMDUR TAKE 1 TABLET BY MOUTH EVERYDAY AT BEDTIME  latanoprost 0.005 % ophthalmic solution Commonly known as: XALATAN Place 1 drop into both eyes at bedtime.   metoprolol succinate 50 MG 24 hr tablet Commonly known as: TOPROL-XL Take 1 tablet (50 mg total) by mouth daily. Take with or immediately following a meal.   nitroGLYCERIN 0.4 MG SL tablet Commonly known as: Nitrostat Place 1 tablet (0.4 mg total) under  the tongue every 5 (five) minutes as needed for chest pain.   PARoxetine 40 MG tablet Commonly known as: PAXIL Take 1 tablet (40 mg total) by mouth at bedtime.   traMADol 50 MG tablet Commonly known as: Ultram Take 1 tablet (50 mg total) by mouth 2 (two) times daily.       History (reviewed): Past Medical History:  Diagnosis Date  . Anxiety   . Arthritis    Knees, hips  . CAD (coronary artery disease)   . Chronic back pain   . Depression   . GERD (gastroesophageal reflux disease)   . Hyperlipidemia   . Hypertension   . MI (myocardial infarction) (HCC)   . Osteopenia    Past Surgical History:  Procedure Laterality Date  . ECTOPIC PREGNANCY SURGERY    . left breast biospy    . left wrist fracture    . TOTAL KNEE ARTHROPLASTY Left 01/25/2019   Procedure: LEFT TOTAL KNEE ARTHROPLASTY;  Surgeon: Gean Birchwood, MD;  Location: [facility];  Service: Orthopedics;  Laterality: Left;   Family History  Problem Relation Age of Onset  . Asthma Father   . Diabetes Sister   . Diabetes Brother    Social History   Socioeconomic History  . Marital status: Married    Spouse name: Teresa Holloway  . Number of children: 1  . Years of education: Not on file  . Highest education level: 10th grade  Occupational History  . Occupation: Retired  Tobacco Use  . Smoking status: Never Smoker  . Smokeless tobacco: Never Used  Vaping Use  . Vaping Use: Never used  Substance and Sexual Activity  . Alcohol use: No  . Drug use: No  . Sexual activity: Not Currently    Birth control/protection: Post-menopausal  Other Topics Concern  . Not on file  Social History Narrative  . Not on file   Social Determinants of Health   Financial Resource Strain: Not on file  Food Insecurity: Not on file  Transportation Needs: Not on file  Physical Activity: Not on file  Stress: Not on file  Social Connections: Not on file    Activities of Daily Living In your present state of health, do you have any difficulty  performing the following activities: 05/18/2020  Hearing? N  Vision? N  Difficulty concentrating or making decisions? N  Walking or climbing stairs? Y  Dressing or bathing? N  Doing errands, shopping? N  Preparing Food and eating ? N  Using the Toilet? N  In the past six months, have you accidently leaked urine? N  Do you have problems with loss of bowel control? N  Managing your Medications? N  Managing your Finances? N  Housekeeping or managing your Housekeeping? N  Some recent data might be hidden   Patient reports hip pan that worsens when climbing stairs.  Patient Education/ Literacy How often do you need to have someone help you when you read instructions, pamphlets, or other written materials from your doctor or pharmacy?: 1 - Never What is the last grade level you completed in school?: 10th grade  Exercise Current Exercise  Habits: The patient does not participate in regular exercise at present, Exercise limited by: orthopedic condition(s)  Diet Patient reports consuming 2 meals a day and 1 snack(s) a day Patient reports that her primary diet is: Regular Patient reports that she does have regular access to food.   Depression Screen PHQ 2/9 Scores 05/18/2020 03/07/2020 12/07/2019 06/15/2019 03/12/2019 01/07/2019 12/10/2018  PHQ - 2 Score 1 2 0 0 1 0 0  PHQ- 9 Score - 3 0 - - - -     Fall Risk Fall Risk  05/18/2020 04/13/2020 03/28/2020 03/07/2020 12/07/2019  Falls in the past year? 1 1 0 1 1  Number falls in past yr: 1 0 - 0 0  Injury with Fall? 0 0 - 0 0  Comment - - - - -  Risk for fall due to : History of fall(s);Impaired mobility History of fall(s) - History of fall(s) History of fall(s)  Follow up Falls evaluation completed Education provided - Education provided Education provided     Objective:  Teresa Holloway seemed alert and oriented and she participated appropriately during our telephone visit.  Blood Pressure Weight BMI  BP Readings from Last 3 Encounters:   04/13/20 123/60  03/28/20 129/68  03/07/20 118/65   Wt Readings from Last 3 Encounters:  04/13/20 152 lb (68.9 kg)  03/28/20 153 lb (69.4 kg)  03/07/20 154 lb (69.9 kg)   BMI Readings from Last 1 Encounters:  04/13/20 26.09 kg/m    *Unable to obtain current vital signs, weight, and BMI due to telephone visit type  Hearing/Vision  . Terrilyn did not seem to have difficulty with hearing/understanding during the telephone conversation . Reports that she has had a formal eye exam by an eye care professional within the past year . Reports that she has not had a formal hearing evaluation within the past year *Unable to fully assess hearing and vision during telephone visit type  Cognitive Function: 6CIT Screen 05/18/2020 09/18/2018  What Year? 0 points 0 points  What month? - 0 points  What time? 0 points 0 points  Count back from 20 0 points 0 points  Months in reverse 4 points 2 points  Repeat phrase 2 points 4 points  Total Score - 6   (Normal:0-7, Significant for Dysfunction: >8)  Normal Cognitive Function Screening: Yes   Immunization & Health Maintenance Record Immunization History  Administered Date(s) Administered  . Fluad Quad(high Dose 65+) 03/07/2020  . Influenza, High Dose Seasonal PF 02/22/2016, 03/07/2017, 03/13/2018  . Influenza,inj,Quad PF,6+ Mos 02/20/2015  . Moderna Sars-Covid-2 Vaccination 06/23/2019, 07/21/2019  . Pneumococcal Conjugate-13 07/01/2014  . Pneumococcal Polysaccharide-23 07/14/2015  . Zoster 12/30/2013    Health Maintenance  Topic Date Due  . COVID-19 Vaccine (3 - Booster for Moderna series) 01/20/2020  . TETANUS/TDAP  06/14/2020 (Originally 06/20/2017)  . MAMMOGRAM  09/15/2020 (Originally 09/25/2015)  . Hepatitis C Screening  03/07/2021 (Originally 1940/11/27)  . INFLUENZA VACCINE  Completed  . DEXA SCAN  Completed  . PNA vac Low Risk Adult  Completed       Assessment  This is a routine wellness examination for Teresa Holloway.  Health Maintenance: Due or Overdue Health Maintenance Due  Topic Date Due  . COVID-19 Vaccine (3 - Booster for Moderna series) 01/20/2020    Teresa Holloway does not need a referral for Community Assistance: Care Management:   no Social Work:    no Prescription Assistance:  no Nutrition/Diabetes Education:  no   Plan:  Personalized Goals Goals Addressed            This Visit's Progress   . Patient Stated       05/18/2020 AWV Goal: Exercise for General Health   Patient will verbalize understanding of the benefits of increased physical activity:  Exercising regularly is important. It will improve your overall fitness, flexibility, and endurance.  Regular exercise also will improve your overall health. It can help you control your weight, reduce stress, and improve your bone density.  Over the next year, patient will increase physical activity as tolerated with a goal of at least 150 minutes of moderate physical activity per week.   You can tell that you are exercising at a moderate intensity if your heart starts beating faster and you start breathing faster but can still hold a conversation.  Moderate-intensity exercise ideas include:  Walking 1 mile (1.6 km) in about 15 minutes  Biking  Hiking  Golfing  Dancing  Water aerobics  Patient will verbalize understanding of everyday activities that increase physical activity by providing examples like the following: ? Yard work, such as: ? Pushing a Surveyor, mining ? Raking and bagging leaves ? Washing your car ? Pushing a stroller ? Shoveling snow ? Gardening ? Washing windows or floors  Patient will be able to explain general safety guidelines for exercising:   Before you start a new exercise program, talk with your health care provider.  Do not exercise so much that you hurt yourself, feel dizzy, or get very short of breath.  Wear comfortable clothes and wear shoes with good support.  Drink plenty  of water while you exercise to prevent dehydration or heat stroke.  Work out until your breathing and your heartbeat get faster.       Personalized Health Maintenance & Screening Recommendations  Td vaccine Screening mammography Bone densitometry screening  Lung Cancer Screening Recommended: no (Low Dose CT Chest recommended if Age 66-80 years, 30 pack-year currently smoking OR have quit w/in past 15 years) Hepatitis C Screening recommended: yes HIV Screening recommended: no  Advanced Directives: Written information was not prepared per patient's request.  Referrals & Orders No orders of the defined types were placed in this encounter.   Follow-up Plan . Follow-up with Bennie Pierini, FNP as planned . Schedule mammogram . Schedule dexa scan . Tdap . Hepatitis C screen    I have personally reviewed and noted the following in the patient's chart:   . Medical and social history . Use of alcohol, tobacco or illicit drugs  . Current medications and supplements . Functional ability and status . Nutritional status . Physical activity . Advanced directives . List of other physicians . Hospitalizations, surgeries, and ER visits in previous 12 months . Vitals . Screenings to include cognitive, depression, and falls . Referrals and appointments  In addition, I have reviewed and discussed with Teresa Holloway certain preventive protocols, quality metrics, and best practice recommendations. A written personalized care plan for preventive services as well as general preventive health recommendations is available and can be mailed to the patient at her request.      Mariam Dollar, LPN    06/05/863   Patient declined after visit summary

## 2020-05-29 ENCOUNTER — Ambulatory Visit: Payer: Self-pay | Admitting: Nurse Practitioner

## 2020-06-16 ENCOUNTER — Encounter: Payer: Self-pay | Admitting: Nurse Practitioner

## 2020-06-16 ENCOUNTER — Ambulatory Visit (INDEPENDENT_AMBULATORY_CARE_PROVIDER_SITE_OTHER): Payer: Medicare Other | Admitting: Nurse Practitioner

## 2020-06-16 ENCOUNTER — Other Ambulatory Visit: Payer: Self-pay

## 2020-06-16 VITALS — BP 90/44 | HR 55 | Temp 96.8°F | Ht 64.0 in | Wt 150.6 lb

## 2020-06-16 DIAGNOSIS — M810 Age-related osteoporosis without current pathological fracture: Secondary | ICD-10-CM | POA: Diagnosis not present

## 2020-06-16 DIAGNOSIS — Z6830 Body mass index (BMI) 30.0-30.9, adult: Secondary | ICD-10-CM

## 2020-06-16 DIAGNOSIS — I25111 Atherosclerotic heart disease of native coronary artery with angina pectoris with documented spasm: Secondary | ICD-10-CM | POA: Diagnosis not present

## 2020-06-16 DIAGNOSIS — M170 Bilateral primary osteoarthritis of knee: Secondary | ICD-10-CM | POA: Diagnosis not present

## 2020-06-16 DIAGNOSIS — K219 Gastro-esophageal reflux disease without esophagitis: Secondary | ICD-10-CM | POA: Diagnosis not present

## 2020-06-16 DIAGNOSIS — I10 Essential (primary) hypertension: Secondary | ICD-10-CM

## 2020-06-16 DIAGNOSIS — F3342 Major depressive disorder, recurrent, in full remission: Secondary | ICD-10-CM

## 2020-06-16 DIAGNOSIS — Z23 Encounter for immunization: Secondary | ICD-10-CM

## 2020-06-16 DIAGNOSIS — E785 Hyperlipidemia, unspecified: Secondary | ICD-10-CM | POA: Diagnosis not present

## 2020-06-16 MED ORDER — TRAMADOL HCL 50 MG PO TABS: 50.0000 mg | ORAL_TABLET | Freq: Two times a day (BID) | ORAL | 2 refills | Status: DC

## 2020-06-16 NOTE — Patient Instructions (Signed)

## 2020-06-16 NOTE — Progress Notes (Signed)
Subjective:    Patient ID: Teresa Holloway, female    DOB: 17-Jan-1941, 80 y.o.   MRN: [id number]   Chief Complaint: Medical Management of Chronic Issues    HPI:  1. Essential hypertension, benign No c/o chest pain, sob or headache. Doe snot check blood pressure at home. BP Readings from Last 3 Encounters:  06/16/20 (!) 90/44  04/13/20 123/60  03/28/20 129/68     2. Hyperlipidemia with target LDL less than 100 Does oit watch and does no dedicate dexercise. Lab Results  Component Value Date   CHOL 142 03/07/2020   HDL 72 03/07/2020   LDLCALC 54 03/07/2020   TRIG 87 03/07/2020   CHOLHDL 2.0 03/07/2020     3. Coronary artery disease involving native coronary artery of native heart with angina pectoris with documented spasm (HCC) Last appointment was 10/06/19. According to office note , no changes were made to plan of care. She is still on her imdur and plavix.  4. Gastroesophageal reflux disease without esophagitis No real issues  5. Recurrent major depressive disorder, in full remission (HCC) On paxil daily and is doing well. Depression screen [facility] 2/9 06/16/2020 05/18/2020 03/07/2020  Decreased Interest 0 1 2  Down, Depressed, Hopeless 0 0 0  PHQ - 2 Score 0 1 2  Altered sleeping - - 0  Tired, decreased energy - - 1  Change in appetite - - 0  Feeling bad or failure about yourself  - - 0  Trouble concentrating - - 0  Moving slowly or fidgety/restless - - 0  Suicidal thoughts - - 0  PHQ-9 Score - - 3  Difficult doing work/chores - - Not difficult at all  Some recent data might be hidden     6. Age related osteoporosis, unspecified pathological fracture presence Last dexascan was in 2015. Needs to be repeated  7. BMI 30.0-30.9,adult No recent weight changes. Wt Readings from Last 3 Encounters:  06/16/20 150 lb 9.6 oz (68.3 kg)  04/13/20 152 lb (68.9 kg)  03/28/20 153 lb (69.4 kg)   BMI Readings from Last 3 Encounters:  06/16/20 25.85 kg/m  04/13/20  26.09 kg/m  03/28/20 26.26 kg/m   8. Arthritis bil knees Is on ultram daily. SAYS SHE CAN HARDLY DO ANYTHING WITHOUT IT.    Outpatient Encounter Medications as of 06/16/2020  Medication Sig  . amLODipine (NORVASC) 10 MG tablet Take 1 tablet (10 mg total) by mouth daily.  Marland Kitchen aspirin EC 81 MG tablet Take 1 tablet (81 mg total) by mouth 2 (two) times daily.  Marland Kitchen atorvastatin (LIPITOR) 40 MG tablet Take 1 tablet (40 mg total) by mouth daily at 6 PM.  . benazepril (LOTENSIN) 40 MG tablet Take 1 tablet (40 mg total) by mouth daily.  . cholecalciferol (VITAMIN D) 1000 UNITS tablet Take 1,000 Units by mouth daily.  . clopidogrel (PLAVIX) 75 MG tablet Take 1 tablet (75 mg total) by mouth daily.  . diclofenac sodium (VOLTAREN) 1 % GEL Apply 2 g topically 4 (four) times daily. (Patient taking differently: Apply 2 g topically 4 (four) times daily as needed (pain).)  . hydrochlorothiazide (HYDRODIURIL) 25 MG tablet Take 1 tablet (25 mg total) by mouth daily.  . isosorbide mononitrate (IMDUR) 60 MG 24 hr tablet TAKE 1 TABLET BY MOUTH EVERYDAY AT BEDTIME  . latanoprost (XALATAN) 0.005 % ophthalmic solution Place 1 drop into both eyes at bedtime.  . metoprolol succinate (TOPROL-XL) 50 MG 24 hr tablet Take 1 tablet (50 mg total) by  mouth daily. Take with or immediately following a meal.  . nitroGLYCERIN (NITROSTAT) 0.4 MG SL tablet Place 1 tablet (0.4 mg total) under the tongue every 5 (five) minutes as needed for chest pain.  Marland Kitchen PARoxetine (PAXIL) 40 MG tablet Take 1 tablet (40 mg total) by mouth at bedtime.  . traMADol (ULTRAM) 50 MG tablet Take 1 tablet (50 mg total) by mouth 2 (two) times daily.     Past Surgical History:  Procedure Laterality Date  . ECTOPIC PREGNANCY SURGERY    . left breast biospy    . left wrist fracture    . TOTAL KNEE ARTHROPLASTY Left 01/25/2019   Procedure: LEFT TOTAL KNEE ARTHROPLASTY;  Surgeon: Gean Birchwood, MD;  Location: [facility];  Service: Orthopedics;  Laterality: Left;     Family History  Problem Relation Age of Onset  . Asthma Father   . Diabetes Sister   . Diabetes Brother     New complaints: - still having left foot pain- has appointment with podiatrist next week. - has skin lesion on right neck  area. Comes and goes.  Social history: Lives with her husband  Controlled substance contract: 06/16/20    Review of Systems  Constitutional: Negative for diaphoresis.  Eyes: Negative for pain.  Respiratory: Negative for shortness of breath.   Cardiovascular: Negative for chest pain, palpitations and leg swelling.  Gastrointestinal: Negative for abdominal pain.  Endocrine: Negative for polydipsia.  Skin: Negative for rash.  Neurological: Negative for dizziness, weakness and headaches.  Hematological: Does not bruise/bleed easily.  All other systems reviewed and are negative.      Objective:   Physical Exam Vitals and nursing note reviewed.  Constitutional:      General: She is not in acute distress.    Appearance: Normal appearance. She is well-developed and well-nourished.  HENT:     Head: Normocephalic.     Nose: Nose normal.     Mouth/Throat:     Mouth: Oropharynx is clear and moist.  Eyes:     Extraocular Movements: EOM normal.     Pupils: Pupils are equal, round, and reactive to light.  Neck:     Vascular: No carotid bruit or JVD.  Cardiovascular:     Rate and Rhythm: Normal rate and regular rhythm.     Pulses: Intact distal pulses.     Heart sounds: Normal heart sounds.  Pulmonary:     Effort: Pulmonary effort is normal. No respiratory distress.     Breath sounds: Normal breath sounds. No wheezing or rales.  Chest:     Chest wall: No tenderness.  Abdominal:     General: Bowel sounds are normal. There is no distension or abdominal bruit. Aorta is normal.     Palpations: Abdomen is soft. There is no hepatomegaly, splenomegaly, mass or pulsatile mass.     Tenderness: There is no abdominal tenderness.  Musculoskeletal:         General: No edema. Normal range of motion.     Cervical back: Normal range of motion and neck supple.  Lymphadenopathy:     Cervical: No cervical adenopathy.  Skin:    General: Skin is warm and dry.     Findings: Rash:  exercises.     Comments: 2CM RAISED DRY FLAKY LESION ON RIGHT SIDE OF NECK  Neurological:     Mental Status: She is alert and oriented to person, place, and time.     Deep Tendon Reflexes: Reflexes are normal and symmetric.  Psychiatric:  Mood and Affect: Mood and affect normal.        Behavior: Behavior normal.        Thought Content: Thought content normal.        Judgment: Judgment normal.    BP (!) 90/44   Pulse (!) 55   Temp (!) 96.8 F (36 C)   Ht 5\' 4"  (1.626 m)   Wt 150 lb 9.6 oz (68.3 kg)   SpO2 97%   BMI 25.85 kg/m         Assessment & Plan:  Teresa Holloway comes in today with chief complaint of Medical Management of Chronic Issues   Diagnosis and orders addressed:  1. Essential hypertension, benign Low sodium diet  2. Hyperlipidemia with target LDL less than 100 Low fat diet  3. Coronary artery disease involving native coronary artery of native heart with angina pectoris with documented spasm [facility]) Keep yearly follow up with cardiologist  4. Gastroesophageal reflux disease without esophagitis Avoid spicy foods Do not eat 2 hours prior to bedtime  5. Recurrent major depressive disorder, in full remission [facility]) Stress management  6. Age related osteoporosis, unspecified pathological fracture presence Weight bearing Will repeat dexascan today  7. BMI 30.0-30.9,adult Discussed diet and exercise for person with BMI >25 Will recheck weight in 3-6 months  8. Arthritis of both knees - traMADol (ULTRAM) 50 MG tablet; Take 1 tablet (50 mg total) by mouth 2 (two) times daily.  Dispense: 60 tablet; Refill: 2   Labs pending Health Maintenance reviewed Diet and exercise encouraged  Follow up plan: 3  months   Mary-Margaret Daphine Deutscher, FNP

## 2020-06-16 NOTE — Addendum Note (Signed)
Addended by: Christia Reading on: 06/16/2020 09:08 AM   Modules accepted: Orders

## 2020-06-26 DIAGNOSIS — M79672 Pain in left foot: Secondary | ICD-10-CM | POA: Diagnosis not present

## 2020-06-26 DIAGNOSIS — M76822 Posterior tibial tendinitis, left leg: Secondary | ICD-10-CM | POA: Diagnosis not present

## 2020-06-26 DIAGNOSIS — M25579 Pain in unspecified ankle and joints of unspecified foot: Secondary | ICD-10-CM | POA: Diagnosis not present

## 2020-07-08 ENCOUNTER — Other Ambulatory Visit: Payer: Self-pay | Admitting: Nurse Practitioner

## 2020-07-17 DIAGNOSIS — M79672 Pain in left foot: Secondary | ICD-10-CM | POA: Diagnosis not present

## 2020-07-17 DIAGNOSIS — M76822 Posterior tibial tendinitis, left leg: Secondary | ICD-10-CM | POA: Diagnosis not present

## 2020-08-10 ENCOUNTER — Other Ambulatory Visit: Payer: Self-pay | Admitting: Nurse Practitioner

## 2020-08-10 DIAGNOSIS — E785 Hyperlipidemia, unspecified: Secondary | ICD-10-CM

## 2020-08-17 ENCOUNTER — Ambulatory Visit: Payer: Medicare Other | Admitting: Nurse Practitioner

## 2020-08-24 ENCOUNTER — Ambulatory Visit (INDEPENDENT_AMBULATORY_CARE_PROVIDER_SITE_OTHER): Payer: Medicare Other | Admitting: Nurse Practitioner

## 2020-08-24 ENCOUNTER — Encounter: Payer: Self-pay | Admitting: Nurse Practitioner

## 2020-08-24 ENCOUNTER — Other Ambulatory Visit: Payer: Self-pay

## 2020-08-24 VITALS — BP 127/71 | HR 57 | Temp 97.2°F | Resp 20 | Ht 64.0 in | Wt 149.0 lb

## 2020-08-24 DIAGNOSIS — R195 Other fecal abnormalities: Secondary | ICD-10-CM

## 2020-08-24 NOTE — Progress Notes (Signed)
Subjective:    Patient ID: Teresa Holloway, female    DOB: April 27, 1940, 80 y.o.   MRN: [id number]   Chief Complaint: Discuss bowel movements   HPI Patient comes in requesting a referral to GI. She had an episode of bowel incontinence a few weeks ago. Only occurred that one time. She also says that her stools look like they have coffee grounds in it.    Review of Systems  Constitutional: Negative for unexpected weight change.  Gastrointestinal: Positive for blood in stool (?). Negative for constipation, diarrhea, nausea, rectal pain and vomiting.  Psychiatric/Behavioral: Negative.   All other systems reviewed and are negative.      Objective:   Physical Exam Vitals and nursing note reviewed.  Constitutional:      Appearance: Normal appearance.  Cardiovascular:     Rate and Rhythm: Normal rate and regular rhythm.     Heart sounds: Normal heart sounds.  Pulmonary:     Effort: Pulmonary effort is normal.     Breath sounds: Normal breath sounds.  Abdominal:     General: Abdomen is flat. Bowel sounds are normal.     Palpations: Abdomen is soft.  Skin:    General: Skin is warm and dry.  Neurological:     General: No focal deficit present.     Mental Status: She is alert and oriented to person, place, and time.  Psychiatric:        Mood and Affect: Mood normal.        Behavior: Behavior normal.     BP 127/71   Pulse (!) 57   Temp (!) 97.2 F (36.2 C) (Temporal)   Resp 20   Ht 5' 4" (1.626 m)   Wt 149 lb (67.6 kg)   SpO2 96%   BMI 25.58 kg/m        Assessment & Plan:  Teresa Holloway in today with chief complaint of Discuss bowel movements   1. Dark stools Watch stools for changes in color Labs pending - CBC with Differential/Platelet - CMP14+EGFR - Ambulatory referral to Gastroenterology    The above assessment and management plan was discussed with the patient. The patient verbalized understanding of and has agreed to the management plan. Patient  is aware to call the clinic if symptoms persist or worsen. Patient is aware when to return to the clinic for a follow-up visit. Patient educated on when it is appropriate to go to the emergency department.   Mary-Margaret Hassell Done, FNP  Subjective:    Patient ID: Teresa Holloway, female    DOB: April 27, 1940, 80 y.o.   MRN: [id number]   Chief Complaint: Discuss bowel movements   HPI Patient comes in requesting a referral to GI. She had an episode of bowel incontinence a few weeks ago. Only occurred that one time. She also says that her stools look like they have coffee grounds in it.    Review of Systems  Constitutional: Negative for unexpected weight change.  Gastrointestinal: Positive for blood in stool (?). Negative for constipation, diarrhea, nausea, rectal pain and vomiting.  Psychiatric/Behavioral: Negative.   All other systems reviewed and are negative.      Objective:   Physical Exam Vitals and nursing note reviewed.  Constitutional:      Appearance: Normal appearance.  Cardiovascular:     Rate and Rhythm: Normal rate and regular rhythm.     Heart sounds: Normal heart sounds.  Pulmonary:     Effort: Pulmonary effort is normal.     Breath sounds: Normal breath sounds.  Abdominal:     General: Abdomen is flat. Bowel sounds are normal.     Palpations: Abdomen is soft.  Skin:    General: Skin is warm and dry.  Neurological:     General: No focal deficit present.     Mental Status: She is alert and oriented to person, place, and time.  Psychiatric:        Mood and Affect: Mood normal.        Behavior: Behavior normal.     BP 127/71   Pulse (!) 57   Temp (!) 97.2 F (36.2 C) (Temporal)   Resp 20   Ht 5' 4" (1.626 m)   Wt 149 lb (67.6 kg)   SpO2 96%   BMI 25.58 kg/m        Assessment & Plan:  Teresa Holloway in today with chief complaint of Discuss bowel movements   1. Dark stools Watch stools for changes in color Labs pending - CBC with Differential/Platelet - CMP14+EGFR - Ambulatory referral to Gastroenterology    The above assessment and management plan was discussed with the patient. The patient verbalized understanding of and has agreed to the management plan. Patient  is aware to call the clinic if symptoms persist or worsen. Patient is aware when to return to the clinic for a follow-up visit. Patient educated on when it is appropriate to go to the emergency department.   Mary-Margaret Hassell Done, FNP

## 2020-08-25 ENCOUNTER — Encounter: Payer: Self-pay | Admitting: Nurse Practitioner

## 2020-09-01 ENCOUNTER — Ambulatory Visit (INDEPENDENT_AMBULATORY_CARE_PROVIDER_SITE_OTHER): Payer: Medicare Other | Admitting: Nurse Practitioner

## 2020-09-01 ENCOUNTER — Encounter: Payer: Self-pay | Admitting: Nurse Practitioner

## 2020-09-01 ENCOUNTER — Ambulatory Visit: Payer: Medicare Other

## 2020-09-01 ENCOUNTER — Other Ambulatory Visit: Payer: Self-pay

## 2020-09-01 VITALS — BP 114/58 | HR 55 | Temp 97.2°F | Resp 20 | Ht 64.0 in | Wt 149.0 lb

## 2020-09-01 DIAGNOSIS — I25111 Atherosclerotic heart disease of native coronary artery with angina pectoris with documented spasm: Secondary | ICD-10-CM | POA: Diagnosis not present

## 2020-09-01 DIAGNOSIS — M810 Age-related osteoporosis without current pathological fracture: Secondary | ICD-10-CM | POA: Diagnosis not present

## 2020-09-01 DIAGNOSIS — I10 Essential (primary) hypertension: Secondary | ICD-10-CM

## 2020-09-01 DIAGNOSIS — E785 Hyperlipidemia, unspecified: Secondary | ICD-10-CM | POA: Diagnosis not present

## 2020-09-01 DIAGNOSIS — M170 Bilateral primary osteoarthritis of knee: Secondary | ICD-10-CM

## 2020-09-01 DIAGNOSIS — Z6830 Body mass index (BMI) 30.0-30.9, adult: Secondary | ICD-10-CM

## 2020-09-01 DIAGNOSIS — M1612 Unilateral primary osteoarthritis, left hip: Secondary | ICD-10-CM

## 2020-09-01 DIAGNOSIS — F3342 Major depressive disorder, recurrent, in full remission: Secondary | ICD-10-CM

## 2020-09-01 DIAGNOSIS — K219 Gastro-esophageal reflux disease without esophagitis: Secondary | ICD-10-CM | POA: Diagnosis not present

## 2020-09-01 DIAGNOSIS — F1120 Opioid dependence, uncomplicated: Secondary | ICD-10-CM

## 2020-09-01 MED ORDER — ATORVASTATIN CALCIUM 40 MG PO TABS: ORAL_TABLET | ORAL | 1 refills | Status: DC

## 2020-09-01 MED ORDER — METOPROLOL SUCCINATE ER 50 MG PO TB24: 50.0000 mg | ORAL_TABLET | Freq: Every day | ORAL | 1 refills | Status: DC

## 2020-09-01 MED ORDER — BENAZEPRIL HCL 40 MG PO TABS: 40.0000 mg | ORAL_TABLET | Freq: Every day | ORAL | 1 refills | Status: DC

## 2020-09-01 MED ORDER — ISOSORBIDE MONONITRATE ER 60 MG PO TB24: ORAL_TABLET | ORAL | 1 refills | Status: DC

## 2020-09-01 MED ORDER — PAROXETINE HCL 40 MG PO TABS: 40.0000 mg | ORAL_TABLET | Freq: Every day | ORAL | 1 refills | Status: DC

## 2020-09-01 MED ORDER — HYDROCHLOROTHIAZIDE 25 MG PO TABS
25.0000 mg | ORAL_TABLET | Freq: Every day | ORAL | 1 refills | Status: DC
Start: 2020-09-01 — End: 2020-12-05

## 2020-09-01 MED ORDER — TRAMADOL HCL 50 MG PO TABS: 50.0000 mg | ORAL_TABLET | Freq: Two times a day (BID) | ORAL | 2 refills | Status: DC

## 2020-09-01 MED ORDER — CLOPIDOGREL BISULFATE 75 MG PO TABS: 75.0000 mg | ORAL_TABLET | Freq: Every day | ORAL | 1 refills | Status: DC

## 2020-09-01 MED ORDER — AMLODIPINE BESYLATE 10 MG PO TABS
10.0000 mg | ORAL_TABLET | Freq: Every day | ORAL | 1 refills | Status: DC
Start: 2020-09-01 — End: 2020-12-05

## 2020-09-01 NOTE — Progress Notes (Signed)
Subjective:    Patient ID: Teresa Holloway, female    DOB: 11/20/1940, 80 y.o.   MRN: [id number]   Chief Complaint: medical management of chronic issues     HPI:  1. Essential hypertension, benign *NO c/o chest pain, sob or headache. Does not check blood blood pressure at home. BP Readings from Last 3 Encounters:  08/24/20 127/71  06/16/20 (!) 90/44  04/13/20 123/60     2. Hyperlipidemia with target LDL less than 100 Doe snot really watch diet and does not do much exercise due to arthritis. Lab Results  Component Value Date   CHOL 142 03/07/2020   HDL 72 03/07/2020   LDLCALC 54 03/07/2020   TRIG 87 03/07/2020   CHOLHDL 2.0 03/07/2020     3. Coronary artery disease involving native coronary artery of native heart with angina pectoris with documented spasm [facility]) Patient saw cardiology 10/06/19. No change was made to plan of care. She is still taking her plavix without any side effects.  4. Gastroesophageal reflux disease without esophagitis Only uses OTC meds as needed.  5. Age related osteoporosis, unspecified pathological fracture presence Last dexascan was 06/23/13. Needs to be repeated today.  6. Primary osteoarthritis of left hip Has constant pain in  both knees. Has trouble walking some days.  7. Recurrent major depressive disorder, in full remission (HCC) Is on paxil and is doing well.  Depression screen [facility] 2/9 09/01/2020 08/24/2020 06/16/2020  Decreased Interest 0 0 0  Down, Depressed, Hopeless 0 0 0  PHQ - 2 Score 0 0 0  Altered sleeping - 0 -  Tired, decreased energy - 0 -  Change in appetite - 0 -  Feeling bad or failure about yourself  - 0 -  Trouble concentrating - 0 -  Moving slowly or fidgety/restless - 0 -  Suicidal thoughts - 0 -  PHQ-9 Score - 0 -  Difficult doing work/chores - Not difficult at all -  Some recent data might be hidden    8. Opioid type dependence, continuous (HCC) Pain assessment: Cause of pain- osteoarthritis Pain  location- bil knees Pain on scale of 1-10- 5/10 currently Frequency- mainly in knees and hips What increases pain-standing and walking to long What makes pain Better-rest Effects on ADL - none Any change in general medical condition-none  Current opioids rx- ultram 50mg  bid # meds rx- 60 Effectiveness of current meds-helps with pain but never completely relieves pain. Adverse reactions from pain meds-none Morphine equivalent-  Pill count performed-Yes Last drug screen - 03/07/20 ( high risk q33m, moderate risk q56m, low risk yearly ) Urine drug screen today- No Was the NCCSR reviewed- yes  If yes were their any concerning findings? - no   Overdose risk: 1  Pain contract signed on:09/01/20   9. BMI 30.0-30.9,adult No recent weight changes    Outpatient Encounter Medications as of 09/01/2020  Medication Sig  . amLODipine (NORVASC) 10 MG tablet Take 1 tablet (10 mg total) by mouth daily.  Marland Kitchen aspirin EC 81 MG tablet Take 1 tablet (81 mg total) by mouth 2 (two) times daily.  Marland Kitchen atorvastatin (LIPITOR) 40 MG tablet TAKE 1 TABLET BY MOUTH DAILY AT 6 PM.  . benazepril (LOTENSIN) 40 MG tablet Take 1 tablet (40 mg total) by mouth daily.  . cholecalciferol (VITAMIN D) 1000 UNITS tablet Take 1,000 Units by mouth daily.  . clopidogrel (PLAVIX) 75 MG tablet Take 1 tablet (75 mg total) by mouth daily.  . diclofenac sodium (  VOLTAREN) 1 % GEL Apply 2 g topically 4 (four) times daily. (Patient taking differently: Apply 2 g topically 4 (four) times daily as needed (pain).)  . hydrochlorothiazide (HYDRODIURIL) 25 MG tablet Take 1 tablet (25 mg total) by mouth daily.  . isosorbide mononitrate (IMDUR) 60 MG 24 hr tablet TAKE 1 TABLET BY MOUTH EVERYDAY AT BEDTIME  . latanoprost (XALATAN) 0.005 % ophthalmic solution Place 1 drop into both eyes at bedtime.  . metoprolol succinate (TOPROL-XL) 50 MG 24 hr tablet TAKE 1 TABLET (50 MG TOTAL) BY MOUTH DAILY. TAKE WITH OR IMMEDIATELY FOLLOWING A MEAL.   . nitroGLYCERIN (NITROSTAT) 0.4 MG SL tablet Place 1 tablet (0.4 mg total) under the tongue every 5 (five) minutes as needed for chest pain.  Marland Kitchen PARoxetine (PAXIL) 40 MG tablet Take 1 tablet (40 mg total) by mouth at bedtime.  . traMADol (ULTRAM) 50 MG tablet Take 1 tablet (50 mg total) by mouth 2 (two) times daily.   No facility-administered encounter medications on file as of 09/01/2020.    Past Surgical History:  Procedure Laterality Date  . ECTOPIC PREGNANCY SURGERY    . left breast biospy    . left wrist fracture    . TOTAL KNEE ARTHROPLASTY Left 01/25/2019   Procedure: LEFT TOTAL KNEE ARTHROPLASTY;  Surgeon: Gean Birchwood, MD;  Location: [facility];  Service: Orthopedics;  Laterality: Left;    Family History  Problem Relation Age of Onset  . Asthma Father   . Diabetes Sister   . Diabetes Brother     New complaints: None today  Social history: Lives with her husband   Review of Systems  Constitutional: Negative for diaphoresis.  Eyes: Negative for pain.  Respiratory: Negative for shortness of breath.   Cardiovascular: Negative for chest pain, palpitations and leg swelling.  Gastrointestinal: Negative for abdominal pain.  Endocrine: Negative for polydipsia.  Skin: Negative for rash.  Neurological: Negative for dizziness, weakness and headaches.  Hematological: Does not bruise/bleed easily.  All other systems reviewed and are negative.      Objective:   Physical Exam Vitals and nursing note reviewed.  Constitutional:      General: She is not in acute distress.    Appearance: Normal appearance. She is well-developed.  HENT:     Head: Normocephalic.     Nose: Nose normal.  Eyes:     Pupils: Pupils are equal, round, and reactive to light.  Neck:     Vascular: No carotid bruit or JVD.  Cardiovascular:     Rate and Rhythm: Normal rate and regular rhythm.     Heart sounds: Normal heart sounds.  Pulmonary:     Effort: Pulmonary effort is normal. No respiratory  distress.     Breath sounds: Normal breath sounds. No wheezing or rales.  Chest:     Chest wall: No tenderness.  Abdominal:     General: Bowel sounds are normal. There is no distension or abdominal bruit.     Palpations: Abdomen is soft. There is no hepatomegaly, splenomegaly, mass or pulsatile mass.     Tenderness: There is no abdominal tenderness.  Musculoskeletal:        General: Normal range of motion.     Cervical back: Normal range of motion and neck supple.  Lymphadenopathy:     Cervical: No cervical adenopathy.  Skin:    General: Skin is warm and dry.  Neurological:     Mental Status: She is alert and oriented to person, place, and time.  Deep Tendon Reflexes: Reflexes are normal and symmetric.  Psychiatric:        Behavior: Behavior normal.        Thought Content: Thought content normal.        Judgment: Judgment normal.     BP (!) 114/58   Pulse (!) 55   Temp (!) 97.2 F (36.2 C) (Temporal)   Resp 20   Ht 5\' 4"  (1.626 m)   Wt 149 lb (67.6 kg)   SpO2 97%   BMI 25.58 kg/m        Assessment & Plan:  Teresa Holloway comes in today with chief complaint of Medical Management of Chronic Issues   Diagnosis and orders addressed:  1. Essential hypertension, benign [place] sodium diet - CBC with Differential/Platelet - CMP14+EGFR - benazepril (LOTENSIN) 40 MG tablet; Take 1 tablet (40 mg total) by mouth daily.  Dispense: 90 tablet; Refill: 1 - hydrochlorothiazide (HYDRODIURIL) 25 MG tablet; Take 1 tablet (25 mg total) by mouth daily.  Dispense: 90 tablet; Refill: 1 - amLODipine (NORVASC) 10 MG tablet; Take 1 tablet (10 mg total) by mouth daily.  Dispense: 90 tablet; Refill: 1  2. Hyperlipidemia with target LDL less than 100 Low fat diet - Lipid panel - atorvastatin (LIPITOR) 40 MG tablet; TAKE 1 TABLET BY MOUTH DAILY AT 6 PM.  Dispense: 90 tablet; Refill: 1  3. Coronary artery disease involving native coronary artery of native heart with angina pectoris with  documented spasm (HCC) Keep yearly follow up with cardiology - clopidogrel (PLAVIX) 75 MG tablet; Take 1 tablet (75 mg total) by mouth daily.  Dispense: 90 tablet; Refill: 1 - isosorbide mononitrate (IMDUR) 60 MG 24 hr tablet; TAKE 1 TABLET BY MOUTH EVERYDAY AT BEDTIME  Dispense: 90 tablet; Refill: 1 - metoprolol succinate (TOPROL-XL) 50 MG 24 hr tablet; Take 1 tablet (50 mg total) by mouth daily. Take with or immediately following a meal.  Dispense: 90 tablet; Refill: 1  4. Gastroesophageal reflux disease without esophagitis Avoid spicy foods Do not eat 2 hours prior to bedtime  5. Age related osteoporosis, unspecified pathological fracture presence Does not want to do dexacan today  6. Primary osteoarthritis of left hip Moist heat alternating with ice.  7. Recurrent major depressive disorder, in full remission (HCC) Stress manageemnt - hydrochlorothiazide (HYDRODIURIL) 25 MG tablet; Take 1 tablet (25 mg total) by mouth daily.  Dispense: 90 tablet; Refill: 1 - PARoxetine (PAXIL) 40 MG tablet; Take 1 tablet (40 mg total) by mouth at bedtime.  Dispense: 90 tablet; Refill: 1  8. Opioid type dependence, continuous (HCC)  9. BMI 30.0-30.9,adult Discussed diet and exercise for person with BMI >25 Will recheck weight in 3-6 months   10. Arthritis of both knees - traMADol (ULTRAM) 50 MG tablet; Take 1 tablet (50 mg total) by mouth 2 (two) times daily.  Dispense: 60 tablet; Refill: 2   Labs pending Health Maintenance reviewed Diet and exercise encouraged  Follow up plan: 3 months   Mary-Margaret Daphine Deutscher, FNP

## 2020-09-01 NOTE — Patient Instructions (Signed)
Fall Prevention in the Home, Adult Falls can cause injuries and can happen to people of all ages. There are many things you can do to make your home safe and to help prevent falls. Ask for help when making these changes. What actions can I take to prevent falls? General Instructions  Use good lighting in all rooms. Replace any light bulbs that burn out.  Turn on the lights in dark areas. Use night-lights.  Keep items that you use often in easy-to-reach places. Lower the shelves around your home if needed.  Set up your furniture so you have a clear path. Avoid moving your furniture around.  Do not have throw rugs or other things on the floor that can make you trip.  Avoid walking on wet floors.  If any of your floors are uneven, fix them.  Add color or contrast paint or tape to clearly mark and help you see: ? Grab bars or handrails. ? First and last steps of staircases. ? Where the edge of each step is.  If you use a stepladder: ? Make sure that it is fully opened. Do not climb a closed stepladder. ? Make sure the sides of the stepladder are locked in place. ? Ask someone to hold the stepladder while you use it.  Know where your pets are when moving through your home. What can I do in the bathroom?  Keep the floor dry. Clean up any water on the floor right away.  Remove soap buildup in the tub or shower.  Use nonskid mats or decals on the floor of the tub or shower.  Attach bath mats securely with double-sided, nonslip rug tape.  If you need to sit down in the shower, use a plastic, nonslip stool.  Install grab bars by the toilet and in the tub and shower. Do not use towel bars as grab bars.      What can I do in the bedroom?  Make sure that you have a light by your bed that is easy to reach.  Do not use any sheets or blankets for your bed that hang to the floor.  Have a firm chair with side arms that you can use for support when you get dressed. What can I do in  the kitchen?  Clean up any spills right away.  If you need to reach something above you, use a step stool with a grab bar.  Keep electrical cords out of the way.  Do not use floor polish or wax that makes floors slippery. What can I do with my stairs?  Do not leave any items on the stairs.  Make sure that you have a light switch at the top and the bottom of the stairs.  Make sure that there are handrails on both sides of the stairs. Fix handrails that are broken or loose.  Install nonslip stair treads on all your stairs.  Avoid having throw rugs at the top or bottom of the stairs.  Choose a carpet that does not hide the edge of the steps on the stairs.  Check carpeting to make sure that it is firmly attached to the stairs. Fix carpet that is loose or worn. What can I do on the outside of my home?  Use bright outdoor lighting.  Fix the edges of walkways and driveways and fix any cracks.  Remove anything that might make you trip as you walk through a door, such as a raised step or threshold.  Trim any   bushes or trees on paths to your home.  Check to see if handrails are loose or broken and that both sides of all steps have handrails.  Install guardrails along the edges of any raised decks and porches.  Clear paths of anything that can make you trip, such as tools or rocks.  Have leaves, snow, or ice cleared regularly.  Use sand or salt on paths during winter.  Clean up any spills in your garage right away. This includes grease or oil spills. What other actions can I take?  Wear shoes that: ? Have a low heel. Do not wear high heels. ? Have rubber bottoms. ? Feel good on your feet and fit well. ? Are closed at the toe. Do not wear open-toe sandals.  Use tools that help you move around if needed. These include: ? Canes. ? Walkers. ? Scooters. ? Crutches.  Review your medicines with your doctor. Some medicines can make you feel dizzy. This can increase your chance  of falling. Ask your doctor what else you can do to help prevent falls. Where to find more information  Centers for Disease Control and Prevention, [employer]: www.cdc.gov  National Institute on Aging: www.nia.nih.gov Contact a doctor if:  You are afraid of falling at home.  You feel weak, drowsy, or dizzy at home.  You fall at home. Summary  There are many simple things that you can do to make your home safe and to help prevent falls.  Ways to make your home safe include removing things that can make you trip and installing grab bars in the bathroom.  Ask for help when making these changes in your home. This information is not intended to replace advice given to you by your health care provider. Make sure you discuss any questions you have with your health care provider. Document Revised: 11/10/2019 Document Reviewed: 11/10/2019 Elsevier Patient Education  2021 Elsevier Inc.  

## 2020-09-06 ENCOUNTER — Other Ambulatory Visit: Payer: Self-pay | Admitting: Nurse Practitioner

## 2020-09-06 DIAGNOSIS — M170 Bilateral primary osteoarthritis of knee: Secondary | ICD-10-CM

## 2020-09-07 ENCOUNTER — Encounter: Payer: Self-pay | Admitting: *Deleted

## 2020-09-27 DIAGNOSIS — H401412 Capsular glaucoma with pseudoexfoliation of lens, right eye, moderate stage: Secondary | ICD-10-CM | POA: Diagnosis not present

## 2020-09-27 DIAGNOSIS — H401421 Capsular glaucoma with pseudoexfoliation of lens, left eye, mild stage: Secondary | ICD-10-CM | POA: Diagnosis not present

## 2020-09-27 DIAGNOSIS — H25813 Combined forms of age-related cataract, bilateral: Secondary | ICD-10-CM | POA: Diagnosis not present

## 2020-10-12 ENCOUNTER — Other Ambulatory Visit: Payer: Medicare Other

## 2020-10-30 ENCOUNTER — Ambulatory Visit (INDEPENDENT_AMBULATORY_CARE_PROVIDER_SITE_OTHER): Payer: Medicare Other | Admitting: Nurse Practitioner

## 2020-10-30 ENCOUNTER — Other Ambulatory Visit: Payer: Self-pay

## 2020-10-30 ENCOUNTER — Encounter: Payer: Self-pay | Admitting: Nurse Practitioner

## 2020-10-30 VITALS — BP 125/62 | HR 52 | Temp 97.3°F | Resp 20 | Ht 64.0 in | Wt 149.0 lb

## 2020-10-30 DIAGNOSIS — K5901 Slow transit constipation: Secondary | ICD-10-CM | POA: Diagnosis not present

## 2020-10-30 NOTE — Progress Notes (Signed)
Subjective:    Patient ID: Teresa Holloway, female    DOB: [birth year], 80 y.o.   MRN: [id number]   Chief Complaint: bowel changes  HPI  Patient comes in today c/o constipation intermittently. She uses miralax as needed. It helps her to go. She has referral into GI and she has appointment December 27, 2020. Has bowel moverment about every 4 days   Review of Systems  Constitutional:  Negative for diaphoresis.  Eyes:  Negative for pain.  Respiratory:  Negative for shortness of breath.   Cardiovascular:  Negative for chest pain, palpitations and leg swelling.  Gastrointestinal:  Negative for abdominal pain.  Endocrine: Negative for polydipsia.  Skin:  Negative for rash.  Neurological:  Negative for dizziness, weakness and headaches.  Hematological:  Does not bruise/bleed easily.  All other systems reviewed and are negative.     Objective:   Physical Exam Vitals reviewed.  Constitutional:      Appearance: Normal appearance.  Cardiovascular:     Rate and Rhythm: Normal rate and regular rhythm.     Heart sounds: Normal heart sounds.  Pulmonary:     Breath sounds: Normal breath sounds.  Abdominal:     General: Abdomen is flat. Bowel sounds are normal.     Palpations: Abdomen is soft.  Skin:    General: Skin is warm.  Neurological:     General: No focal deficit present.     Mental Status: She is alert and oriented to person, place, and time.  Psychiatric:        Mood and Affect: Mood normal.        Behavior: Behavior normal.    BP 125/62   Pulse (!) 52   Temp (!) 97.3 F (36.3 C) (Temporal)   Resp 20   Ht 5\' 4"  (1.626 m)   Wt 149 lb (67.6 kg)   SpO2 95%   BMI 25.58 kg/m        Assessment & Plan:  Teresa Holloway in today with chief complaint of constipation  1. Slow transit constipation Increase fiber in diet Continue miralax daily Keep appointment with GI  Mary-Margaret Daphine Deutscher, FNP     The above assessment and management plan was discussed with  the patient. The patient verbalized understanding of and has agreed to the management plan. Patient is aware to call the clinic if symptoms persist or worsen. Patient is aware when to return to the clinic for a follow-up visit. Patient educated on when it is appropriate to go to the emergency department.   Mary-Margaret Daphine Deutscher, FNP

## 2020-10-30 NOTE — Patient Instructions (Signed)

## 2020-11-06 DIAGNOSIS — L570 Actinic keratosis: Secondary | ICD-10-CM | POA: Diagnosis not present

## 2020-11-06 DIAGNOSIS — X32XXXD Exposure to sunlight, subsequent encounter: Secondary | ICD-10-CM | POA: Diagnosis not present

## 2020-11-06 DIAGNOSIS — L821 Other seborrheic keratosis: Secondary | ICD-10-CM | POA: Diagnosis not present

## 2020-12-05 ENCOUNTER — Other Ambulatory Visit: Payer: Self-pay

## 2020-12-05 ENCOUNTER — Ambulatory Visit (INDEPENDENT_AMBULATORY_CARE_PROVIDER_SITE_OTHER): Payer: Medicare Other | Admitting: Nurse Practitioner

## 2020-12-05 ENCOUNTER — Encounter: Payer: Self-pay | Admitting: Nurse Practitioner

## 2020-12-05 VITALS — BP 121/63 | HR 52 | Temp 97.2°F | Resp 20 | Ht 64.0 in | Wt 146.0 lb

## 2020-12-05 DIAGNOSIS — E785 Hyperlipidemia, unspecified: Secondary | ICD-10-CM | POA: Diagnosis not present

## 2020-12-05 DIAGNOSIS — I10 Essential (primary) hypertension: Secondary | ICD-10-CM

## 2020-12-05 DIAGNOSIS — F3342 Major depressive disorder, recurrent, in full remission: Secondary | ICD-10-CM

## 2020-12-05 DIAGNOSIS — I25111 Atherosclerotic heart disease of native coronary artery with angina pectoris with documented spasm: Secondary | ICD-10-CM | POA: Diagnosis not present

## 2020-12-05 DIAGNOSIS — M170 Bilateral primary osteoarthritis of knee: Secondary | ICD-10-CM

## 2020-12-05 DIAGNOSIS — K219 Gastro-esophageal reflux disease without esophagitis: Secondary | ICD-10-CM | POA: Diagnosis not present

## 2020-12-05 DIAGNOSIS — M810 Age-related osteoporosis without current pathological fracture: Secondary | ICD-10-CM | POA: Diagnosis not present

## 2020-12-05 DIAGNOSIS — Z6825 Body mass index (BMI) 25.0-25.9, adult: Secondary | ICD-10-CM

## 2020-12-05 LAB — CBC WITH DIFFERENTIAL/PLATELET
Basophils Absolute: 0.1 10*3/uL (ref 0.0–0.2)
Basos: 1 %
EOS (ABSOLUTE): 0.3 10*3/uL (ref 0.0–0.4)
Eos: 5 %
Hematocrit: 34.7 % (ref 34.0–46.6)
Hemoglobin: 11.2 g/dL (ref 11.1–15.9)
Immature Grans (Abs): 0 10*3/uL (ref 0.0–0.1)
Immature Granulocytes: 0 %
Lymphocytes Absolute: 1.8 10*3/uL (ref 0.7–3.1)
Lymphs: 31 %
MCH: 29.9 pg (ref 26.6–33.0)
MCHC: 32.3 g/dL (ref 31.5–35.7)
MCV: 93 fL (ref 79–97)
Monocytes Absolute: 0.6 10*3/uL (ref 0.1–0.9)
Monocytes: 11 %
Neutrophils Absolute: 3 10*3/uL (ref 1.4–7.0)
Neutrophils: 52 %
Platelets: 240 10*3/uL (ref 150–450)
RBC: 3.74 x10E6/uL — ABNORMAL LOW (ref 3.77–5.28)
RDW: 12.3 % (ref 11.7–15.4)
WBC: 5.8 10*3/uL (ref 3.4–10.8)

## 2020-12-05 LAB — LIPID PANEL
Chol/HDL Ratio: 1.8 ratio (ref 0.0–4.4)
Cholesterol, Total: 122 mg/dL (ref 100–199)
HDL: 68 mg/dL (ref >39)
LDL Chol Calc (NIH): 41 mg/dL (ref 0–99)
Triglycerides: 62 mg/dL (ref 0–149)
VLDL Cholesterol Cal: 13 mg/dL (ref 5–40)

## 2020-12-05 LAB — CMP14+EGFR
ALT: 8 IU/L (ref 0–32)
AST: 10 IU/L (ref 0–40)
Albumin/Globulin Ratio: 2.4 — ABNORMAL HIGH (ref 1.2–2.2)
Albumin: 4.5 g/dL (ref 3.7–4.7)
Alkaline Phosphatase: 105 IU/L (ref 44–121)
BUN/Creatinine Ratio: 16 (ref 12–28)
BUN: 19 mg/dL (ref 8–27)
Bilirubin Total: 0.6 mg/dL (ref 0.0–1.2)
CO2: 28 mmol/L (ref 20–29)
Calcium: 10 mg/dL (ref 8.7–10.3)
Chloride: 100 mmol/L (ref 96–106)
Creatinine, Ser: 1.19 mg/dL — ABNORMAL HIGH (ref 0.57–1.00)
Globulin, Total: 1.9 g/dL (ref 1.5–4.5)
Glucose: 89 mg/dL (ref 65–99)
Potassium: 3.7 mmol/L (ref 3.5–5.2)
Sodium: 142 mmol/L (ref 134–144)
Total Protein: 6.4 g/dL (ref 6.0–8.5)
eGFR: 47 mL/min/{1.73_m2} — ABNORMAL LOW (ref >59)

## 2020-12-05 MED ORDER — PAROXETINE HCL 40 MG PO TABS: 40.0000 mg | ORAL_TABLET | Freq: Every day | ORAL | 1 refills | Status: DC

## 2020-12-05 MED ORDER — ISOSORBIDE MONONITRATE ER 60 MG PO TB24: ORAL_TABLET | ORAL | 1 refills | Status: DC

## 2020-12-05 MED ORDER — METOPROLOL SUCCINATE ER 50 MG PO TB24: 50.0000 mg | ORAL_TABLET | Freq: Every day | ORAL | 1 refills | Status: DC

## 2020-12-05 MED ORDER — TRAMADOL HCL 50 MG PO TABS: 50.0000 mg | ORAL_TABLET | Freq: Two times a day (BID) | ORAL | 2 refills | Status: DC

## 2020-12-05 MED ORDER — CLOPIDOGREL BISULFATE 75 MG PO TABS: 75.0000 mg | ORAL_TABLET | Freq: Every day | ORAL | 1 refills | Status: DC

## 2020-12-05 MED ORDER — AMLODIPINE BESYLATE 10 MG PO TABS: 10.0000 mg | ORAL_TABLET | Freq: Every day | ORAL | 1 refills | Status: DC

## 2020-12-05 MED ORDER — HYDROCHLOROTHIAZIDE 25 MG PO TABS: 25.0000 mg | ORAL_TABLET | Freq: Every day | ORAL | 1 refills | Status: DC

## 2020-12-05 MED ORDER — BENAZEPRIL HCL 40 MG PO TABS: 40.0000 mg | ORAL_TABLET | Freq: Every day | ORAL | 1 refills | Status: DC

## 2020-12-05 MED ORDER — ATORVASTATIN CALCIUM 40 MG PO TABS: ORAL_TABLET | ORAL | 1 refills | Status: DC

## 2020-12-05 NOTE — Progress Notes (Signed)
Subjective:    Patient ID: Teresa Holloway, female    DOB: Jul 09, 1940, 80 y.o.   MRN: [id number]   Chief Complaint: medical management of chronic issues     HPI:  1. Essential hypertension, benign No c/o chest pain, sob or headache. Does not check blood pressure at home. BP Readings from Last 3 Encounters:  10/30/20 125/62  09/01/20 (!) 114/58  08/24/20 127/71     2. Coronary artery disease involving native coronary artery of native heart with angina pectoris with documented spasm [facility]) Last saw cardiology on 09/26/19 after a MI. According to note , she was to continue all meds, low carb diet. She was to follow up in a year, which she has not done. Sheis still on imdur, metoprolol and plavix. Says she has an appointment next month.  3. Hyperlipidemia with target LDL less than 100 Doe snot watch diet and does no dedicated exercise due to bil knee pain. Lab Results  Component Value Date   CHOL 142 03/07/2020   HDL 72 03/07/2020   LDLCALC 54 03/07/2020   TRIG 87 03/07/2020   CHOLHDL 2.0 03/07/2020     4. Gastroesophageal reflux disease without esophagitis Only takes OTC meds when needed.  5. Recurrent major depressive disorder, in full remission (HCC) Has been on paxil for several years. Says she is doing wewll on medication. Depression screen [facility] 2/9 12/05/2020 10/30/2020 09/01/2020  Decreased Interest 0 0 0  Down, Depressed, Hopeless 0 0 0  PHQ - 2 Score 0 0 0  Altered sleeping 0 - -  Tired, decreased energy 0 - -  Change in appetite 0 - -  Feeling bad or failure about yourself  0 - -  Trouble concentrating 0 - -  Moving slowly or fidgety/restless 0 - -  Suicidal thoughts 0 - -  PHQ-9 Score 0 - -  Difficult doing work/chores Not difficult at all - -  Some recent data might be hidden     6. Arthritis of both knees Has severe arthritis in both knees . Has had total knee replacements. Still has lots of pain with ambulation. Gait is unsteady at times. Takes tramadol  BID daily.  7. Age related osteoporosis, unspecified pathological fracture presence Last dexascan was in 2015. She has refused to repeat in the past.  8. BMI 25.0-25.9,adult Weight is down 3 lbs Wt Readings from Last 3 Encounters:  12/05/20 146 lb (66.2 kg)  10/30/20 149 lb (67.6 kg)  09/01/20 149 lb (67.6 kg)   BMI Readings from Last 3 Encounters:  12/05/20 25.06 kg/m  10/30/20 25.58 kg/m  09/01/20 25.58 kg/m       Outpatient Encounter Medications as of 12/05/2020  Medication Sig   amLODipine (NORVASC) 10 MG tablet Take 1 tablet (10 mg total) by mouth daily.   aspirin EC 81 MG tablet Take 1 tablet (81 mg total) by mouth 2 (two) times daily.   atorvastatin (LIPITOR) 40 MG tablet TAKE 1 TABLET BY MOUTH DAILY AT 6 PM.   benazepril (LOTENSIN) 40 MG tablet Take 1 tablet (40 mg total) by mouth daily.   cholecalciferol (VITAMIN D) 1000 UNITS tablet Take 1,000 Units by mouth daily.   clopidogrel (PLAVIX) 75 MG tablet Take 1 tablet (75 mg total) by mouth daily.   diclofenac sodium (VOLTAREN) 1 % GEL Apply 2 g topically 4 (four) times daily. (Patient taking differently: Apply 2 g topically 4 (four) times daily as needed (pain).)   hydrochlorothiazide (HYDRODIURIL) 25 MG tablet Take  1 tablet (25 mg total) by mouth daily.   isosorbide mononitrate (IMDUR) 60 MG 24 hr tablet TAKE 1 TABLET BY MOUTH EVERYDAY AT BEDTIME   latanoprost (XALATAN) 0.005 % ophthalmic solution Place 1 drop into both eyes at bedtime.   metoprolol succinate (TOPROL-XL) 50 MG 24 hr tablet Take 1 tablet (50 mg total) by mouth daily. Take with or immediately following a meal.   nitroGLYCERIN (NITROSTAT) 0.4 MG SL tablet Place 1 tablet (0.4 mg total) under the tongue every 5 (five) minutes as needed for chest pain.   PARoxetine (PAXIL) 40 MG tablet Take 1 tablet (40 mg total) by mouth at bedtime.   traMADol (ULTRAM) 50 MG tablet Take 1 tablet (50 mg total) by mouth 2 (two) times daily.   No facility-administered  encounter medications on file as of 12/05/2020.    Past Surgical History:  Procedure Laterality Date   ECTOPIC PREGNANCY SURGERY     left breast biospy     left wrist fracture     TOTAL KNEE ARTHROPLASTY Left 01/25/2019   Procedure: LEFT TOTAL KNEE ARTHROPLASTY;  Surgeon: Gean Birchwood, MD;  Location: [facility];  Service: Orthopedics;  Laterality: Left;    Family History  Problem Relation Age of Onset   Asthma Father    Diabetes Sister    Diabetes Brother     New complaints: None today  Social history: Lives with husband  Controlled substance contract: 09/04/20      Review of Systems  Constitutional:  Negative for diaphoresis.  Eyes:  Negative for pain.  Respiratory:  Negative for shortness of breath.   Cardiovascular:  Negative for chest pain, palpitations and leg swelling.  Gastrointestinal:  Negative for abdominal pain.  Endocrine: Negative for polydipsia.  Skin:  Negative for rash.  Neurological:  Negative for dizziness, weakness and headaches.  Hematological:  Does not bruise/bleed easily.  All other systems reviewed and are negative.     Objective:   Physical Exam Vitals and nursing note reviewed.  Constitutional:      General: She is not in acute distress.    Appearance: Normal appearance. She is well-developed.  HENT:     Head: Normocephalic.     Right Ear: Tympanic membrane normal.     Left Ear: Tympanic membrane normal.     Nose: Nose normal.     Mouth/Throat:     Mouth: Mucous membranes are moist.  Eyes:     Pupils: Pupils are equal, round, and reactive to light.  Neck:     Vascular: No carotid bruit or JVD.  Cardiovascular:     Rate and Rhythm: Normal rate and regular rhythm.     Heart sounds: Normal heart sounds.  Pulmonary:     Effort: Pulmonary effort is normal. No respiratory distress.     Breath sounds: Normal breath sounds. No wheezing or rales.  Chest:     Chest wall: No tenderness.  Abdominal:     General: Bowel sounds are normal.  There is no distension or abdominal bruit.     Palpations: Abdomen is soft. There is no hepatomegaly, splenomegaly, mass or pulsatile mass.     Tenderness: There is no abdominal tenderness.  Musculoskeletal:        General: Normal range of motion.     Cervical back: Normal range of motion and neck supple.     Right lower leg: Edema (1+) present.     Left lower leg: Edema (1+) present.     Comments: Iimp on right  side when walking  Lymphadenopathy:     Cervical: No cervical adenopathy.  Skin:    General: Skin is warm and dry.  Neurological:     Mental Status: She is alert and oriented to person, place, and time.     Deep Tendon Reflexes: Reflexes are normal and symmetric.  Psychiatric:        Behavior: Behavior normal.        Thought Content: Thought content normal.        Judgment: Judgment normal.    BP 121/63   Pulse (!) 52   Temp (!) 97.2 F (36.2 C) (Temporal)   Resp 20   Ht 5\' 4"  (1.626 m)   Wt 146 lb (66.2 kg)   SpO2 97%   BMI 25.06 kg/m        Assessment & Plan:   DORINNE WESBY comes in today with chief complaint of Medical Management of Chronic Issues   Diagnosis and orders addressed:  1. Essential hypertension, benign Low sodium diet - benazepril (LOTENSIN) 40 MG tablet; Take 1 tablet (40 mg total) by mouth daily.  Dispense: 90 tablet; Refill: 1 - amLODipine (NORVASC) 10 MG tablet; Take 1 tablet (10 mg total) by mouth daily.  Dispense: 90 tablet; Refill: 1 - hydrochlorothiazide (HYDRODIURIL) 25 MG tablet; Take 1 tablet (25 mg total) by mouth daily.  Dispense: 90 tablet; Refill: 1  2. Coronary artery disease involving native coronary artery of native heart with angina pectoris with documented spasm (HCC) Keep follow up appointmnet with cardiology - clopidogrel (PLAVIX) 75 MG tablet; Take 1 tablet (75 mg total) by mouth daily.  Dispense: 90 tablet; Refill: 1 - metoprolol succinate (TOPROL-XL) 50 MG 24 hr tablet; Take 1 tablet (50 mg total) by mouth  daily. Take with or immediately following a meal.  Dispense: 90 tablet; Refill: 1 - isosorbide mononitrate (IMDUR) 60 MG 24 hr tablet; TAKE 1 TABLET BY MOUTH EVERYDAY AT BEDTIME  Dispense: 90 tablet; Refill: 1  3. Hyperlipidemia with target LDL less than 100 Low fat diet - atorvastatin (LIPITOR) 40 MG tablet; TAKE 1 TABLET BY MOUTH DAILY AT 6 PM.  Dispense: 90 tablet; Refill: 1  4. Gastroesophageal reflux disease without esophagitis Avoid spicy foods Do not eat 2 hours prior to bedtime  5. Recurrent major depressive disorder, in full remission (HCC) Stress manaegement - hydrochlorothiazide (HYDRODIURIL) 25 MG tablet; Take 1 tablet (25 mg total) by mouth daily.  Dispense: 90 tablet; Refill: 1 - PARoxetine (PAXIL) 40 MG tablet; Take 1 tablet (40 mg total) by mouth at bedtime.  Dispense: 90 tablet; Refill: 1  6. Arthritis of both knees Keep follow up with orthopedic - traMADol (ULTRAM) 50 MG tablet; Take 1 tablet (50 mg total) by mouth 2 (two) times daily.  Dispense: 60 tablet; Refill: 2  7. Age related osteoporosis, unspecified pathological fracture presence Weight bearing exercise Patient refuses dexascan  8. BMI 25.0-25.9,adult Discussed diet and exercise for person with BMI >25 Will recheck weight in 3-6 months   Labs pending Health Maintenance reviewed Diet and exercise encouraged  Follow up plan: 3 month   Mary-Margaret Daphine Deutscher, FNP

## 2020-12-05 NOTE — Patient Instructions (Signed)

## 2020-12-27 ENCOUNTER — Ambulatory Visit: Payer: PRIVATE HEALTH INSURANCE | Admitting: Gastroenterology

## 2021-01-30 DIAGNOSIS — H401421 Capsular glaucoma with pseudoexfoliation of lens, left eye, mild stage: Secondary | ICD-10-CM | POA: Diagnosis not present

## 2021-01-30 DIAGNOSIS — H401412 Capsular glaucoma with pseudoexfoliation of lens, right eye, moderate stage: Secondary | ICD-10-CM | POA: Diagnosis not present

## 2021-02-08 ENCOUNTER — Other Ambulatory Visit: Payer: Self-pay

## 2021-02-08 ENCOUNTER — Ambulatory Visit (INDEPENDENT_AMBULATORY_CARE_PROVIDER_SITE_OTHER): Payer: Medicare Other | Admitting: Nurse Practitioner

## 2021-02-08 ENCOUNTER — Encounter: Payer: Self-pay | Admitting: Nurse Practitioner

## 2021-02-08 VITALS — BP 119/68 | HR 58 | Temp 97.9°F | Resp 20 | Ht 64.0 in | Wt 143.0 lb

## 2021-02-08 DIAGNOSIS — F4321 Adjustment disorder with depressed mood: Secondary | ICD-10-CM | POA: Diagnosis not present

## 2021-02-08 DIAGNOSIS — F3342 Major depressive disorder, recurrent, in full remission: Secondary | ICD-10-CM

## 2021-02-08 DIAGNOSIS — F419 Anxiety disorder, unspecified: Secondary | ICD-10-CM | POA: Diagnosis not present

## 2021-02-08 MED ORDER — ESCITALOPRAM OXALATE 20 MG PO TABS: 20.0000 mg | ORAL_TABLET | Freq: Every day | ORAL | 5 refills | Status: DC

## 2021-02-08 NOTE — Progress Notes (Signed)
Subjective:    Patient ID: Teresa Holloway, female    DOB: [birth year], 80 y.o.   MRN: [id number]  Chief Complaint: anxiety/grief  HPI Patient comes in c/o anxiety. Her husband passed suddenly 2 months ago and she is  not coping well. She is on edge all the time , mood swings and cries often. Misses her husband a lot. She has been on paxil for a long time. Appetite is good. Depression screen [facility] 2/9 02/08/2021 12/05/2020 10/30/2020  Decreased Interest 2 0 0  Down, Depressed, Hopeless 0 0 0  PHQ - 2 Score 2 0 0  Altered sleeping 0 0 -  Tired, decreased energy 2 0 -  Change in appetite 0 0 -  Feeling bad or failure about yourself  3 0 -  Trouble concentrating 2 0 -  Moving slowly or fidgety/restless 0 0 -  Suicidal thoughts 0 0 -  PHQ-9 Score 9 0 -  Difficult doing work/chores Somewhat difficult Not difficult at all -  Some recent data might be hidden    GAD 7 : Generalized Anxiety Score 02/08/2021 02/08/2021 12/05/2020 03/07/2020  Nervous, Anxious, on Edge 2 2 0 0  Control/stop worrying 2 2 0 0  Worry too much - different things 2 - 0 0  Trouble relaxing 3 3 0 0  Restless 0 0 0 0  Easily annoyed or irritable 3 3 0 0  Afraid - awful might happen 0 0 0 0  Total GAD 7 Score 12 - 0 0  Anxiety Difficulty Somewhat difficult Somewhat difficult Not difficult at all Not difficult at all         Review of Systems  Constitutional:  Negative for diaphoresis.  Eyes:  Negative for pain.  Respiratory:  Negative for shortness of breath.   Cardiovascular:  Negative for chest pain, palpitations and leg swelling.  Gastrointestinal:  Negative for abdominal pain.  Endocrine: Negative for polydipsia.  Skin:  Negative for rash.  Neurological:  Negative for dizziness, weakness and headaches.  Hematological:  Does not bruise/bleed easily.  All other systems reviewed and are negative.     Objective:   Physical Exam Vitals and nursing note reviewed.  Constitutional:      Appearance:  Normal appearance.  Cardiovascular:     Rate and Rhythm: Normal rate and regular rhythm.     Heart sounds: Normal heart sounds.  Pulmonary:     Effort: Pulmonary effort is normal.     Breath sounds: Normal breath sounds.  Skin:    General: Skin is warm.  Neurological:     General: No focal deficit present.     Mental Status: She is alert and oriented to person, place, and time.  Psychiatric:        Thought Content: Thought content normal.    BP 119/68   Pulse (!) 58   Temp 97.9 F (36.6 C) (Temporal)   Resp 20   Ht 5\' 4"  (1.626 m)   Wt 143 lb (64.9 kg)   SpO2 95%   BMI 24.55 kg/m        Assessment & Plan:  Teresa Holloway in today with chief complaint of Anxiety   1. Recurrent major depressive disorder, in full remission (HCC) - escitalopram (LEXAPRO) 20 MG tablet; Take 1 tablet (20 mg total) by mouth daily.  Dispense: 30 tablet; Refill: 5 2. Anxiety 3. Feeling grief Stress management Change paxil to lexapro Cannot due ativan , xanax or klonopin due to pain medication Grief  counseling encouraged     The above assessment and management plan was discussed with the patient. The patient verbalized understanding of and has agreed to the management plan. Patient is aware to call the clinic if symptoms persist or worsen. Patient is aware when to return to the clinic for a follow-up visit. Patient educated on when it is appropriate to go to the emergency department.   Mary-Margaret Daphine Deutscher, FNP

## 2021-02-08 NOTE — Patient Instructions (Signed)
Managing Loss, Adult People experience loss in many different ways throughout their lives. Events such as moving, changing jobs, and losing friends can create a sense of loss. The loss may be as serious as a major health change, divorce, death of a pet, or death of a loved one. All of these types of loss are likely to create a physical and emotional reaction known as grief. Grief is the result of a major change or an absence of something or someone that you count on. Grief is a normal reaction to loss. A variety of factors can affect your grieving experience, including: The nature of your loss. Your relationship to what or whom you lost. Your understanding of grief and how to manage it. Your support system. How to manage lifestyle changes Keep to your normal routine as much as possible. If you have trouble focusing or doing normal activities, it is acceptable to take some time away from your normal routine. Spend time with friends and loved ones. Eat a healthy diet, get plenty of sleep, and rest when you feel tired. How to recognize changes  The way that you deal with your grief will affect your ability to function as you normally do. When grieving, you may experience these changes: Numbness, shock, sadness, anxiety, anger, denial, and guilt. Thoughts about death. Unexpected crying. A physical sensation of emptiness in your stomach. Problems sleeping and eating. Tiredness (fatigue). Loss of interest in normal activities. Dreaming about or imagining seeing the person who died. A need to remember what or whom you lost. Difficulty thinking about anything other than your loss for a period of time. Relief. If you have been expecting the loss for a while, you may feel a sense of relief when it happens. Follow these instructions at home: Activity Express your feelings in healthy ways, such as: Talking with others about your loss. It may be helpful to find others who have had a similar loss, such  as a support group. Writing down your feelings in a journal. Doing physical activities to release stress and emotional energy. Doing creative activities like painting, sculpting, or playing or listening to music. Practicing resilience. This is the ability to recover and adjust after facing challenges. Reading some resources that encourage resilience may help you to learn ways to practice those behaviors.  General instructions Be patient with yourself and others. Allow the grieving process to happen, and remember that grieving takes time. It is likely that you may never feel completely done with some grief. You may find a way to move on while still cherishing memories and feelings about your loss. Accepting your loss is a process. It can take months or longer to adjust. Keep all follow-up visits as told by your health care provider. This is important. Where to find support To get support for managing loss: Ask your health care provider for help and recommendations, such as grief counseling or therapy. Think about joining a support group for people who are managing a loss. Where to find more information You can find more information about managing loss from: American Society of Clinical Oncology: www.cancer.net American Psychological Association: www.apa.org Contact a health care provider if: Your grief is extreme and keeps getting worse. You have ongoing grief that does not improve. Your body shows symptoms of grief, such as illness. You feel depressed, anxious, or lonely. Get help right away if: You have thoughts about hurting yourself or others. If you ever feel like you may hurt yourself or others, or have   thoughts about taking your own life, get help right away. You can go to your nearest emergency department or call: Your local emergency services (911 in the [place] A suicide crisis helpline, such as the National Suicide Prevention Lifeline at 1-800-273-8255. This is open 24 hours a  day. Summary Grief is the result of a major change or an absence of someone or something that you count on. Grief is a normal reaction to loss. The depth of grief and the period of recovery depend on the type of loss and your ability to adjust to the change and process your feelings. Processing grief requires patience and a willingness to accept your feelings and talk about your loss with people who are supportive. It is important to find resources that work for you and to realize that people experience grief differently. There is not one grieving process that works for everyone in the same way. Be aware that when grief becomes extreme, it can lead to more severe issues like isolation, depression, anxiety, or suicidal thoughts. Talk with your health care provider if you have any of these issues. This information is not intended to replace advice given to you by your health care provider. Make sure you discuss any questions you have with your health care provider. Document Revised: 09/30/2019 Document Reviewed: 09/30/2019 Elsevier Patient Education  2022 Elsevier Inc.  

## 2021-02-14 DIAGNOSIS — Z7902 Long term (current) use of antithrombotics/antiplatelets: Secondary | ICD-10-CM | POA: Diagnosis not present

## 2021-02-14 DIAGNOSIS — I2510 Atherosclerotic heart disease of native coronary artery without angina pectoris: Secondary | ICD-10-CM | POA: Diagnosis not present

## 2021-02-14 DIAGNOSIS — I459 Conduction disorder, unspecified: Secondary | ICD-10-CM | POA: Diagnosis not present

## 2021-02-14 DIAGNOSIS — I10 Essential (primary) hypertension: Secondary | ICD-10-CM | POA: Diagnosis not present

## 2021-02-14 DIAGNOSIS — I259 Chronic ischemic heart disease, unspecified: Secondary | ICD-10-CM | POA: Diagnosis not present

## 2021-02-14 DIAGNOSIS — R001 Bradycardia, unspecified: Secondary | ICD-10-CM | POA: Diagnosis not present

## 2021-02-21 DIAGNOSIS — Z96652 Presence of left artificial knee joint: Secondary | ICD-10-CM | POA: Diagnosis not present

## 2021-02-21 DIAGNOSIS — M7061 Trochanteric bursitis, right hip: Secondary | ICD-10-CM | POA: Diagnosis not present

## 2021-02-21 DIAGNOSIS — M25562 Pain in left knee: Secondary | ICD-10-CM | POA: Diagnosis not present

## 2021-02-23 DIAGNOSIS — H25813 Combined forms of age-related cataract, bilateral: Secondary | ICD-10-CM | POA: Diagnosis not present

## 2021-02-23 DIAGNOSIS — H401421 Capsular glaucoma with pseudoexfoliation of lens, left eye, mild stage: Secondary | ICD-10-CM | POA: Diagnosis not present

## 2021-02-23 DIAGNOSIS — H401412 Capsular glaucoma with pseudoexfoliation of lens, right eye, moderate stage: Secondary | ICD-10-CM | POA: Diagnosis not present

## 2021-02-24 IMAGING — DX DG HIP (WITH OR WITHOUT PELVIS) 2-3V*R*
3 series · 3 of 3 positions shown · non-contrast
Comparison: None

CLINICAL DATA: RIGHT hip pain, recent fall

EXAM:
DG HIP (WITH OR WITHOUT PELVIS) 2-3V RIGHT

[pelvis ap]
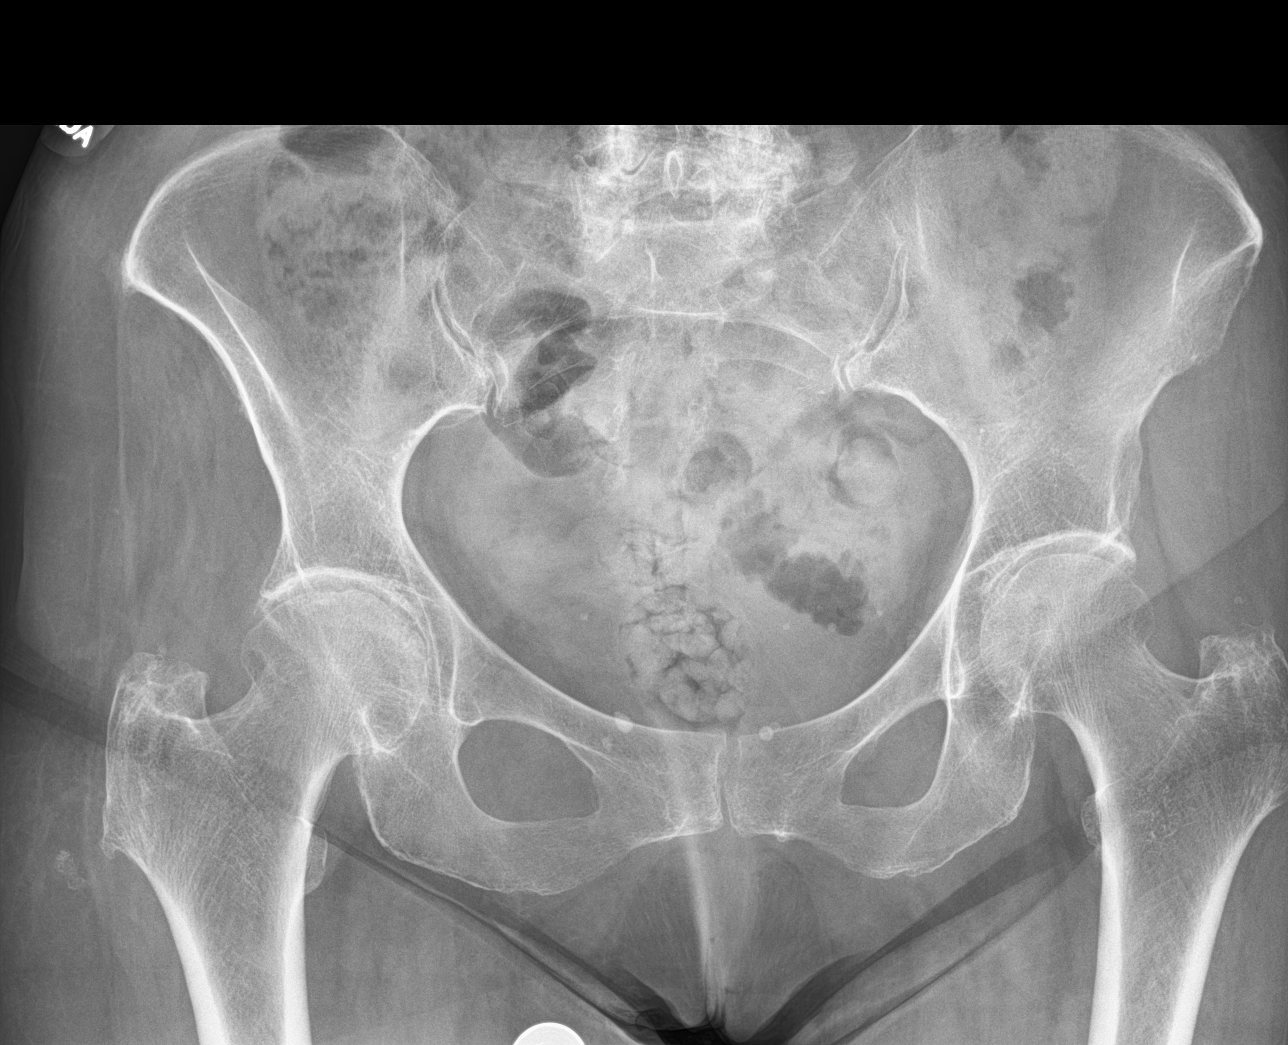

[hip ap]
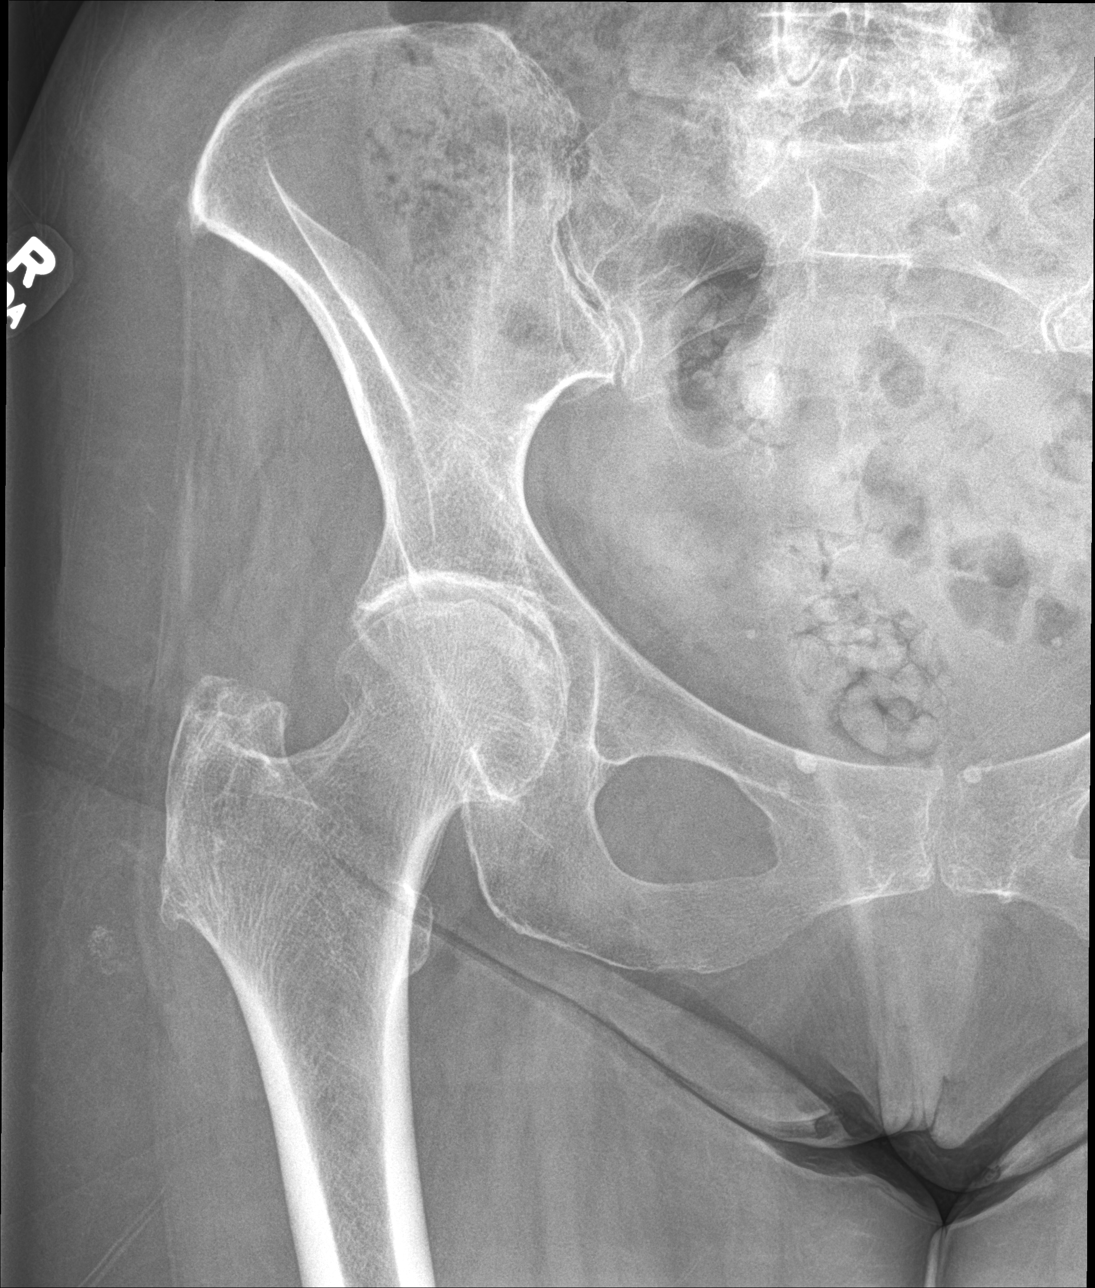

[hip lat]
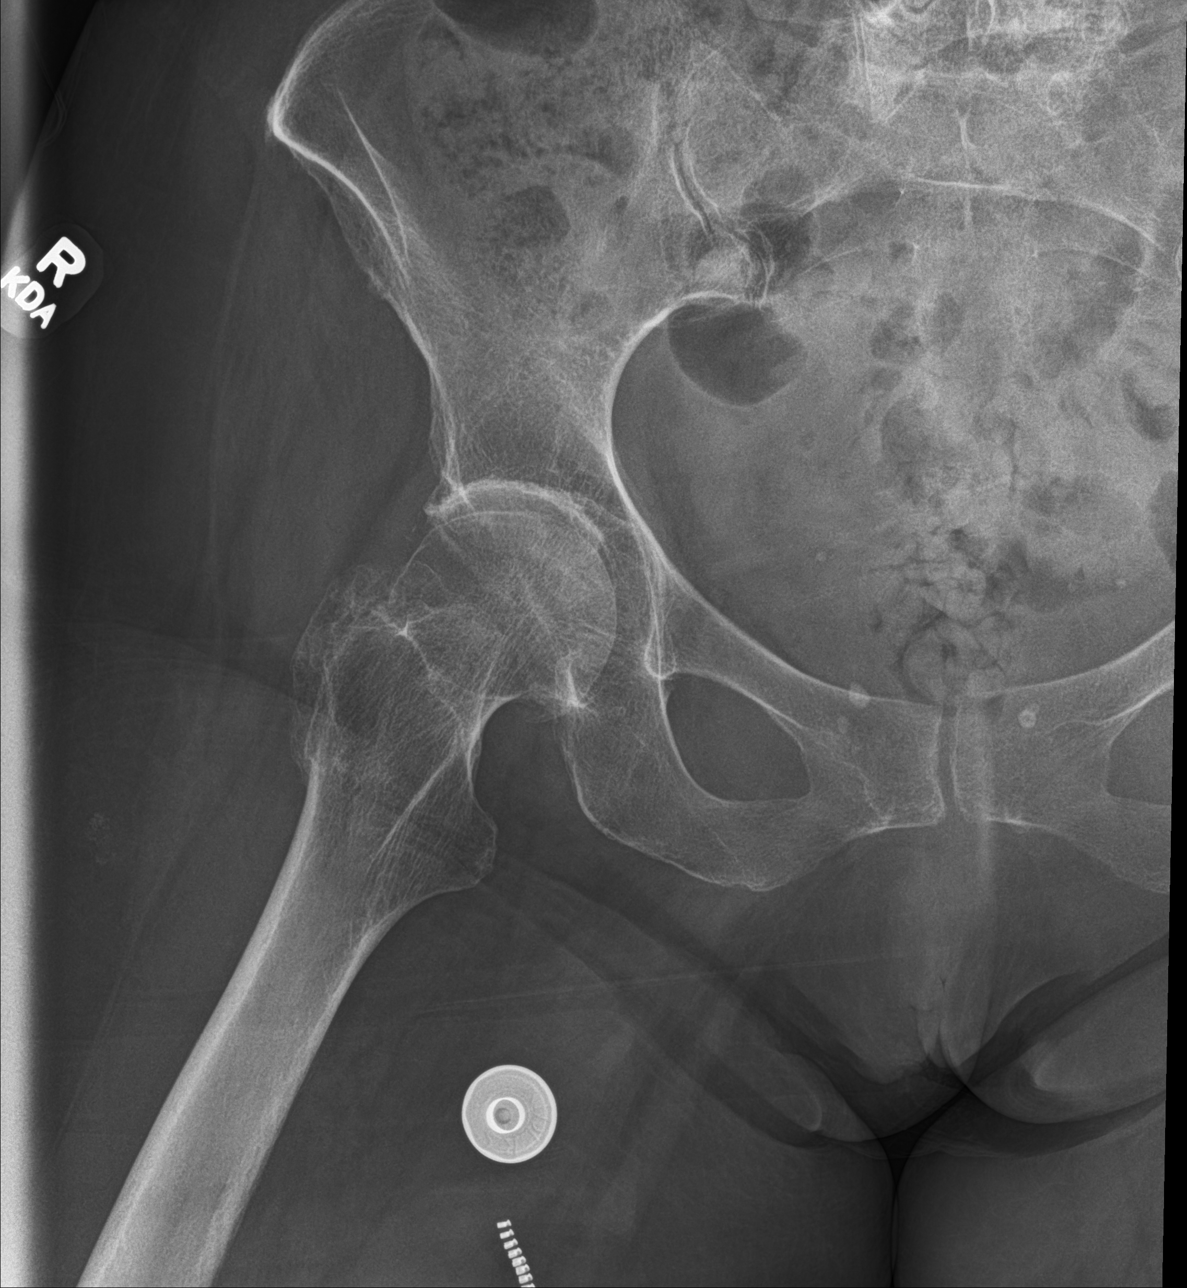

[3 of 3 positions shown; findings below may reference images not displayed]

FINDINGS: Osseous demineralization.

SI joint spaces preserved.

LEFT hip joint space preserved.

Mild narrowing of RIGHT hip joint with spurring and irregularity
consistent with degenerative changes.

No acute fracture, dislocation, or bone destruction.

Degenerative disc and facet disease changes at visualized lower
lumbar spine.
IMPRESSION: Mild degenerative changes of the RIGHT hip joint.

Osseous demineralization.

Degenerative disc and facet disease changes of lower lumbar spine.

## 2021-03-02 ENCOUNTER — Other Ambulatory Visit: Payer: Self-pay | Admitting: Nurse Practitioner

## 2021-03-02 DIAGNOSIS — F3342 Major depressive disorder, recurrent, in full remission: Secondary | ICD-10-CM

## 2021-03-08 ENCOUNTER — Ambulatory Visit (INDEPENDENT_AMBULATORY_CARE_PROVIDER_SITE_OTHER): Payer: Medicare Other | Admitting: Nurse Practitioner

## 2021-03-08 ENCOUNTER — Other Ambulatory Visit: Payer: Self-pay

## 2021-03-08 ENCOUNTER — Encounter: Payer: Self-pay | Admitting: Nurse Practitioner

## 2021-03-08 VITALS — BP 96/56 | HR 56 | Temp 97.2°F | Resp 20 | Ht 64.0 in | Wt 140.0 lb

## 2021-03-08 DIAGNOSIS — M810 Age-related osteoporosis without current pathological fracture: Secondary | ICD-10-CM

## 2021-03-08 DIAGNOSIS — K219 Gastro-esophageal reflux disease without esophagitis: Secondary | ICD-10-CM

## 2021-03-08 DIAGNOSIS — M170 Bilateral primary osteoarthritis of knee: Secondary | ICD-10-CM

## 2021-03-08 DIAGNOSIS — I10 Essential (primary) hypertension: Secondary | ICD-10-CM | POA: Diagnosis not present

## 2021-03-08 DIAGNOSIS — Z23 Encounter for immunization: Secondary | ICD-10-CM | POA: Diagnosis not present

## 2021-03-08 DIAGNOSIS — I25111 Atherosclerotic heart disease of native coronary artery with angina pectoris with documented spasm: Secondary | ICD-10-CM

## 2021-03-08 DIAGNOSIS — M1612 Unilateral primary osteoarthritis, left hip: Secondary | ICD-10-CM

## 2021-03-08 DIAGNOSIS — E785 Hyperlipidemia, unspecified: Secondary | ICD-10-CM | POA: Diagnosis not present

## 2021-03-08 DIAGNOSIS — Z6830 Body mass index (BMI) 30.0-30.9, adult: Secondary | ICD-10-CM

## 2021-03-08 DIAGNOSIS — F3342 Major depressive disorder, recurrent, in full remission: Secondary | ICD-10-CM

## 2021-03-08 MED ORDER — ISOSORBIDE MONONITRATE ER 60 MG PO TB24: ORAL_TABLET | ORAL | 1 refills | Status: DC

## 2021-03-08 MED ORDER — TRAMADOL HCL 50 MG PO TABS: 50.0000 mg | ORAL_TABLET | Freq: Two times a day (BID) | ORAL | 2 refills | Status: DC

## 2021-03-08 MED ORDER — ATORVASTATIN CALCIUM 40 MG PO TABS: ORAL_TABLET | ORAL | 1 refills | Status: DC

## 2021-03-08 MED ORDER — BENAZEPRIL HCL 40 MG PO TABS: 40.0000 mg | ORAL_TABLET | Freq: Every day | ORAL | 1 refills | Status: DC

## 2021-03-08 MED ORDER — METOPROLOL SUCCINATE ER 50 MG PO TB24: 50.0000 mg | ORAL_TABLET | Freq: Every day | ORAL | 1 refills | Status: DC

## 2021-03-08 MED ORDER — AMLODIPINE BESYLATE 5 MG PO TABS: 5.0000 mg | ORAL_TABLET | Freq: Every day | ORAL | 1 refills | Status: DC

## 2021-03-08 MED ORDER — HYDROCHLOROTHIAZIDE 25 MG PO TABS: 25.0000 mg | ORAL_TABLET | Freq: Every day | ORAL | 1 refills | Status: DC

## 2021-03-08 MED ORDER — ESCITALOPRAM OXALATE 20 MG PO TABS: 20.0000 mg | ORAL_TABLET | Freq: Every day | ORAL | 1 refills | Status: DC

## 2021-03-08 MED ORDER — CLOPIDOGREL BISULFATE 75 MG PO TABS: 75.0000 mg | ORAL_TABLET | Freq: Every day | ORAL | 1 refills | Status: DC

## 2021-03-08 NOTE — Patient Instructions (Signed)

## 2021-03-08 NOTE — Progress Notes (Signed)
Subjective:    Patient ID: Teresa Holloway, female    DOB: 1941-04-09, 80 y.o.   MRN: [id number]   Chief Complaint: Medical Management of Chronic Issues    HPI:  1. Essential hypertension, benign NO c/o chest pain, sob or headache. Does not check blood pressure at home BP Readings from Last 3 Encounters:  03/08/21 (!) 96/56  02/08/21 119/68  12/05/20 121/63    2. Coronary artery disease involving native coronary artery of native heart with angina pectoris with documented spasm [facility]) Saw cardiology on 02/14/21. Reviewing office note, no changes were made to plan of care except she decreased amlodipine to 5mg  daily.  3. Hyperlipidemia with target LDL less than 100 She tries to watch diet. Is not able to do any exercise due to hip pain. Lab Results  Component Value Date   CHOL 122 12/05/2020   HDL 68 12/05/2020   LDLCALC 41 12/05/2020   TRIG 62 12/05/2020   CHOLHDL 1.8 12/05/2020    4. Gastroesophageal reflux disease without esophagitis Not no prescription mds. Takes OTC meds as needed  5. Age related osteoporosis, unspecified pathological fracture presence Her last dexascan was done in 2018. She refuses to repeat today  6. Primary osteoarthritis of left hip? Bil knee arthritis Has chronic hip pain. She takes tramadol bid which helps. Rates pian 5/10 today. Walking and standing increases her pain. Rest helps.  7. Recurrent major depressive disorder, in full remission (HCC) Se is lexapro and is doing well. Depression screen [facility] 2/9 03/08/2021 02/08/2021 12/05/2020  Decreased Interest 2 2 0  Down, Depressed, Hopeless 0 0 0  PHQ - 2 Score 2 2 0  Altered sleeping 0 0 0  Tired, decreased energy 0 2 0  Change in appetite 0 0 0  Feeling bad or failure about yourself  0 3 0  Trouble concentrating 2 2 0  Moving slowly or fidgety/restless 0 0 0  Suicidal thoughts 0 0 0  PHQ-9 Score 4 9 0  Difficult doing work/chores Somewhat difficult Somewhat difficult Not difficult at  all  Some recent data might be hidden     8. BMI 30.0-30.9,adult No recent weight changes Wt Readings from Last 3 Encounters:  03/08/21 140 lb (63.5 kg)  02/08/21 143 lb (64.9 kg)  12/05/20 146 lb (66.2 kg)   BMI Readings from Last 3 Encounters:  03/08/21 24.03 kg/m  02/08/21 24.55 kg/m  12/05/20 25.06 kg/m       Outpatient Encounter Medications as of 03/08/2021  Medication Sig   amLODipine (NORVASC) 5 MG tablet Take 5 mg by mouth daily.   aspirin EC 81 MG tablet Take 1 tablet (81 mg total) by mouth 2 (two) times daily.   atorvastatin (LIPITOR) 40 MG tablet TAKE 1 TABLET BY MOUTH DAILY AT 6 PM.   benazepril (LOTENSIN) 40 MG tablet Take 1 tablet (40 mg total) by mouth daily.   cholecalciferol (VITAMIN D) 1000 UNITS tablet Take 1,000 Units by mouth daily.   clopidogrel (PLAVIX) 75 MG tablet Take 1 tablet (75 mg total) by mouth daily.   diclofenac sodium (VOLTAREN) 1 % GEL Apply 2 g topically 4 (four) times daily. (Patient taking differently: Apply 2 g topically 4 (four) times daily as needed (pain).)   escitalopram (LEXAPRO) 20 MG tablet TAKE 1 TABLET BY MOUTH EVERY DAY   hydrochlorothiazide (HYDRODIURIL) 25 MG tablet Take 1 tablet (25 mg total) by mouth daily.   isosorbide mononitrate (IMDUR) 60 MG 24 hr tablet TAKE 1 TABLET BY  MOUTH EVERYDAY AT BEDTIME   latanoprost (XALATAN) 0.005 % ophthalmic solution Place 1 drop into both eyes at bedtime.   metoprolol succinate (TOPROL-XL) 50 MG 24 hr tablet Take 1 tablet (50 mg total) by mouth daily. Take with or immediately following a meal.   nitroGLYCERIN (NITROSTAT) 0.4 MG SL tablet Place 1 tablet (0.4 mg total) under the tongue every 5 (five) minutes as needed for chest pain.   traMADol (ULTRAM) 50 MG tablet Take 1 tablet (50 mg total) by mouth 2 (two) times daily.   [DISCONTINUED] amLODipine (NORVASC) 10 MG tablet Take 1 tablet (10 mg total) by mouth daily.   No facility-administered encounter medications on file as of 03/08/2021.     Past Surgical History:  Procedure Laterality Date   ECTOPIC PREGNANCY SURGERY     left breast biospy     left wrist fracture     TOTAL KNEE ARTHROPLASTY Left 01/25/2019   Procedure: LEFT TOTAL KNEE ARTHROPLASTY;  Surgeon: Gean Birchwood, MD;  Location: [facility];  Service: Orthopedics;  Laterality: Left;    Family History  Problem Relation Age of Onset   Asthma Father    Diabetes Sister    Diabetes Brother     New complaints: None today  Social history: Lives by herself now that her husband has passed away. Her kids check on her daily  Controlled substance contract: 09/04/20     Review of Systems  Constitutional:  Negative for diaphoresis.  Eyes:  Negative for pain.  Respiratory:  Negative for shortness of breath.   Cardiovascular:  Negative for chest pain, palpitations and leg swelling.  Gastrointestinal:  Negative for abdominal pain.  Endocrine: Negative for polydipsia.  Skin:  Negative for rash.  Neurological:  Negative for dizziness, weakness and headaches.  Hematological:  Does not bruise/bleed easily.  All other systems reviewed and are negative.     Objective:   Physical Exam Vitals and nursing note reviewed.  Constitutional:      General: She is not in acute distress.    Appearance: Normal appearance. She is well-developed.  HENT:     Head: Normocephalic.     Right Ear: Tympanic membrane normal.     Left Ear: Tympanic membrane normal.     Nose: Nose normal.     Mouth/Throat:     Mouth: Mucous membranes are moist.  Eyes:     Pupils: Pupils are equal, round, and reactive to light.  Neck:     Vascular: No carotid bruit or JVD.  Cardiovascular:     Rate and Rhythm: Normal rate and regular rhythm.     Heart sounds: Normal heart sounds.  Pulmonary:     Effort: Pulmonary effort is normal. No respiratory distress.     Breath sounds: Normal breath sounds. No wheezing or rales.  Chest:     Chest wall: No tenderness.  Abdominal:     General: Bowel sounds  are normal. There is no distension or abdominal bruit.     Palpations: Abdomen is soft. There is no hepatomegaly, splenomegaly, mass or pulsatile mass.     Tenderness: There is no abdominal tenderness.  Musculoskeletal:        General: Normal range of motion.     Cervical back: Normal range of motion and neck supple.  Lymphadenopathy:     Cervical: No cervical adenopathy.  Skin:    General: Skin is warm and dry.  Neurological:     Mental Status: She is alert and oriented to person, place, and  time.     Deep Tendon Reflexes: Reflexes are normal and symmetric.  Psychiatric:        Behavior: Behavior normal.        Thought Content: Thought content normal.        Judgment: Judgment normal.    BP (!) 96/56   Pulse (!) 56   Temp (!) 97.2 F (36.2 C) (Temporal)   Resp 20   Ht 5\' 4"  (1.626 m)   Wt 140 lb (63.5 kg)   SpO2 94%   BMI 24.03 kg/m        Assessment & Plan:  Teresa Holloway comes in today with chief complaint of Medical Management of Chronic Issues   Diagnosis and orders addressed:  1. Essential hypertension, benign Low sodium diet - benazepril (LOTENSIN) 40 MG tablet; Take 1 tablet (40 mg total) by mouth daily.  Dispense: 90 tablet; Refill: 1 - hydrochlorothiazide (HYDRODIURIL) 25 MG tablet; Take 1 tablet (25 mg total) by mouth daily.  Dispense: 90 tablet; Refill: 1 - amLODipine (NORVASC) 5 MG tablet; Take 1 tablet (5 mg total) by mouth daily.  Dispense: 90 tablet; Refill: 1 - CBC with Differential/Platelet - CMP14+EGFR  2. Coronary artery disease involving native coronary artery of native heart with angina pectoris with documented spasm (HCC) Keep follow up with cardiology yearly - clopidogrel (PLAVIX) 75 MG tablet; Take 1 tablet (75 mg total) by mouth daily.  Dispense: 90 tablet; Refill: 1 - metoprolol succinate (TOPROL-XL) 50 MG 24 hr tablet; Take 1 tablet (50 mg total) by mouth daily. Take with or immediately following a meal.  Dispense: 90 tablet; Refill:  1 - isosorbide mononitrate (IMDUR) 60 MG 24 hr tablet; TAKE 1 TABLET BY MOUTH EVERYDAY AT BEDTIME  Dispense: 90 tablet; Refill: 1  3. Hyperlipidemia with target LDL less than 100 Low fat diet - atorvastatin (LIPITOR) 40 MG tablet; TAKE 1 TABLET BY MOUTH DAILY AT 6 PM.  Dispense: 90 tablet; Refill: 1 - Lipid panel  4. Gastroesophageal reflux disease without esophagitis Avoid spicy foods Do not eat 2 hours prior to bedtim   5. Age related osteoporosis, unspecified pathological fracture presence Refused dexascan today Weight bearing exercise encouraged  6. Primary osteoarthritis of left hip Fall prevention  7. Recurrent major depressive disorder, in full remission (HCC) Stress management - hydrochlorothiazide (HYDRODIURIL) 25 MG tablet; Take 1 tablet (25 mg total) by mouth daily.  Dispense: 90 tablet; Refill: 1 - escitalopram (LEXAPRO) 20 MG tablet; Take 1 tablet (20 mg total) by mouth daily.  Dispense: 90 tablet; Refill: 1  8. BMI 30.0-30.9,adult Discussed diet and exercise for person with BMI >25 Will recheck weight in 3-6 months   9. Arthritis of both knees Fall prevention - traMADol (ULTRAM) 50 MG tablet; Take 1 tablet (50 mg total) by mouth 2 (two) times daily.  Dispense: 60 tablet; Refill: 2   Labs pending Health Maintenance reviewed Diet and exercise encouraged  Follow up plan: 6 months   Mary-Margaret Daphine Deutscher, FNP

## 2021-03-09 LAB — CMP14+EGFR
ALT: 8 IU/L (ref 0–32)
AST: 8 IU/L (ref 0–40)
Albumin/Globulin Ratio: 2.1 (ref 1.2–2.2)
Albumin: 4 g/dL (ref 3.7–4.7)
Alkaline Phosphatase: 92 IU/L (ref 44–121)
BUN/Creatinine Ratio: 20 (ref 12–28)
BUN: 27 mg/dL (ref 8–27)
Bilirubin Total: 0.7 mg/dL (ref 0.0–1.2)
CO2: 28 mmol/L (ref 20–29)
Calcium: 9.7 mg/dL (ref 8.7–10.3)
Chloride: 103 mmol/L (ref 96–106)
Creatinine, Ser: 1.34 mg/dL — ABNORMAL HIGH (ref 0.57–1.00)
Globulin, Total: 1.9 g/dL (ref 1.5–4.5)
Glucose: 84 mg/dL (ref 70–99)
Potassium: 3.6 mmol/L (ref 3.5–5.2)
Sodium: 143 mmol/L (ref 134–144)
Total Protein: 5.9 g/dL — ABNORMAL LOW (ref 6.0–8.5)
eGFR: 40 mL/min/{1.73_m2} — ABNORMAL LOW (ref >59)

## 2021-03-09 LAB — CBC WITH DIFFERENTIAL/PLATELET
Basophils Absolute: 0.1 10*3/uL (ref 0.0–0.2)
Basos: 1 %
EOS (ABSOLUTE): 0.2 10*3/uL (ref 0.0–0.4)
Eos: 3 %
Hematocrit: 33.3 % — ABNORMAL LOW (ref 34.0–46.6)
Hemoglobin: 11 g/dL — ABNORMAL LOW (ref 11.1–15.9)
Immature Grans (Abs): 0 10*3/uL (ref 0.0–0.1)
Immature Granulocytes: 0 %
Lymphocytes Absolute: 1.6 10*3/uL (ref 0.7–3.1)
Lymphs: 25 %
MCH: 30.3 pg (ref 26.6–33.0)
MCHC: 33 g/dL (ref 31.5–35.7)
MCV: 92 fL (ref 79–97)
Monocytes Absolute: 0.8 10*3/uL (ref 0.1–0.9)
Monocytes: 12 %
Neutrophils Absolute: 3.9 10*3/uL (ref 1.4–7.0)
Neutrophils: 59 %
Platelets: 241 10*3/uL (ref 150–450)
RBC: 3.63 x10E6/uL — ABNORMAL LOW (ref 3.77–5.28)
RDW: 12.7 % (ref 11.7–15.4)
WBC: 6.6 10*3/uL (ref 3.4–10.8)

## 2021-03-09 LAB — LIPID PANEL
Chol/HDL Ratio: 1.9 ratio (ref 0.0–4.4)
Cholesterol, Total: 131 mg/dL (ref 100–199)
HDL: 69 mg/dL (ref >39)
LDL Chol Calc (NIH): 50 mg/dL (ref 0–99)
Triglycerides: 55 mg/dL (ref 0–149)
VLDL Cholesterol Cal: 12 mg/dL (ref 5–40)

## 2021-03-12 ENCOUNTER — Other Ambulatory Visit: Payer: Self-pay | Admitting: Nurse Practitioner

## 2021-03-12 DIAGNOSIS — M170 Bilateral primary osteoarthritis of knee: Secondary | ICD-10-CM

## 2021-04-06 DIAGNOSIS — R52 Pain, unspecified: Secondary | ICD-10-CM | POA: Diagnosis not present

## 2021-05-16 ENCOUNTER — Telehealth: Payer: Self-pay | Admitting: *Deleted

## 2021-05-16 ENCOUNTER — Other Ambulatory Visit: Payer: Self-pay

## 2021-05-16 ENCOUNTER — Encounter: Payer: Self-pay | Admitting: Internal Medicine

## 2021-05-16 ENCOUNTER — Ambulatory Visit (INDEPENDENT_AMBULATORY_CARE_PROVIDER_SITE_OTHER): Payer: Medicare Other | Admitting: Internal Medicine

## 2021-05-16 ENCOUNTER — Encounter: Payer: Self-pay | Admitting: *Deleted

## 2021-05-16 ENCOUNTER — Ambulatory Visit: Payer: PRIVATE HEALTH INSURANCE | Admitting: Gastroenterology

## 2021-05-16 VITALS — BP 126/67 | HR 57 | Temp 96.8°F | Ht 64.0 in | Wt 145.4 lb

## 2021-05-16 DIAGNOSIS — K921 Melena: Secondary | ICD-10-CM

## 2021-05-16 DIAGNOSIS — K5900 Constipation, unspecified: Secondary | ICD-10-CM | POA: Diagnosis not present

## 2021-05-16 DIAGNOSIS — Z79899 Other long term (current) drug therapy: Secondary | ICD-10-CM

## 2021-05-16 DIAGNOSIS — R634 Abnormal weight loss: Secondary | ICD-10-CM | POA: Diagnosis not present

## 2021-05-16 MED ORDER — PEG 3350-KCL-NA BICARB-NACL 420 G PO SOLR: ORAL | 0 refills | Status: DC

## 2021-05-16 NOTE — Telephone Encounter (Signed)
Called pt and made aware of pre-op appt details. She voiced understanding

## 2021-05-16 NOTE — Telephone Encounter (Signed)
PA approved. Auth# [id number], DOS: May 24, 2021 - Aug 22, 2021

## 2021-05-16 NOTE — Progress Notes (Signed)
Primary Care Physician:  Chevis Pretty, FNP Primary Gastroenterologist:  Dr. Abbey Chatters  Chief Complaint  Patient presents with   dark stool    Never had tcs. Not much abdominal pain. BM is little chunks    HPI:   Teresa Holloway is a 81 y.o. female who presents to the clinic today by referral from her PCP Chevis Pretty for evaluation.  She has multiple GI complaints for me today.  She states for approximately 1 year she has had black stools.  Also notes difficult constipation.  States she will strain often on the toilet.  Occasional rectal bleeding.  Has tried Maalox which does not help her symptoms.  States when she does have a bowel movement her stool are small balls.  No rectal pain or discomfort.  No previous colonoscopy.  Also notes 30 pound weight loss in the past year.  States she eats whatever she wants and has been losing weight unintentionally.  Denies any upper GI symptoms including heartburn, reflux, epigastric/chest pain, dysphagia/odynophagia.  No abdominal pain.  No previous upper endoscopy or colonoscopy.  No family history of colorectal malignancy.  Patient does take daily ASA and clopidogrel.  States she had a cardiac stent many years ago.  Past Medical History:  Diagnosis Date   Anxiety    Arthritis    Knees, hips   CAD (coronary artery disease)    Chronic back pain    Depression    GERD (gastroesophageal reflux disease)    Hyperlipidemia    Hypertension    MI (myocardial infarction) ([place])    Osteopenia     Past Surgical History:  Procedure Laterality Date   ECTOPIC PREGNANCY SURGERY     left breast biospy     left wrist fracture     TOTAL KNEE ARTHROPLASTY Left 01/25/2019   Procedure: LEFT TOTAL KNEE ARTHROPLASTY;  Surgeon: Frederik Pear, MD;  Location: [facility];  Service: Orthopedics;  Laterality: Left;    Current Outpatient Medications  Medication Sig Dispense Refill   amLODipine (NORVASC) 5 MG tablet Take 1 tablet (5 mg total) by mouth  daily. 90 tablet 1   aspirin EC 81 MG tablet Take 1 tablet (81 mg total) by mouth 2 (two) times daily. 60 tablet 0   atorvastatin (LIPITOR) 40 MG tablet TAKE 1 TABLET BY MOUTH DAILY AT 6 PM. 90 tablet 1   benazepril (LOTENSIN) 40 MG tablet Take 1 tablet (40 mg total) by mouth daily. 90 tablet 1   cholecalciferol (VITAMIN D) 1000 UNITS tablet Take 1,000 Units by mouth daily.     clopidogrel (PLAVIX) 75 MG tablet Take 1 tablet (75 mg total) by mouth daily. 90 tablet 1   diclofenac sodium (VOLTAREN) 1 % GEL Apply 2 g topically 4 (four) times daily. (Patient taking differently: Apply 2 g topically 4 (four) times daily as needed (pain).) 500 g 1   escitalopram (LEXAPRO) 20 MG tablet Take 1 tablet (20 mg total) by mouth daily. 90 tablet 1   hydrochlorothiazide (HYDRODIURIL) 25 MG tablet Take 1 tablet (25 mg total) by mouth daily. 90 tablet 1   isosorbide mononitrate (IMDUR) 60 MG 24 hr tablet TAKE 1 TABLET BY MOUTH EVERYDAY AT BEDTIME 90 tablet 1   latanoprost (XALATAN) 0.005 % ophthalmic solution Place 1 drop into both eyes at bedtime.     metoprolol succinate (TOPROL-XL) 50 MG 24 hr tablet Take 1 tablet (50 mg total) by mouth daily. Take with or immediately following a meal. 90 tablet  1   nitroGLYCERIN (NITROSTAT) 0.4 MG SL tablet Place 1 tablet (0.4 mg total) under the tongue every 5 (five) minutes as needed for chest pain. 30 tablet 1   traMADol (ULTRAM) 50 MG tablet Take 1 tablet (50 mg total) by mouth 2 (two) times daily. 60 tablet 2   No current facility-administered medications for this visit.    Allergies as of 05/16/2021 - Review Complete 05/16/2021  Allergen Reaction Noted   Atarax [hydroxyzine] Nausea And Vomiting 08/10/2012   Bactrim [sulfamethoxazole-trimethoprim] Nausea Only 03/23/2015   Coreg [carvedilol] Other (See Comments) 08/10/2012    Family History  Problem Relation Age of Onset   Asthma Father    Diabetes Sister    Diabetes Brother     Social History   Socioeconomic  History   Marital status: Married    Spouse name: [name]   Number of children: 1   Years of education: Not on file   Highest education level: 10th grade  Occupational History   Occupation: Retired  Tobacco Use   Smoking status: Never   Smokeless tobacco: Never  Vaping Use   Vaping Use: Never used  Substance and Sexual Activity   Alcohol use: No   Drug use: No   Sexual activity: Not Currently    Birth control/protection: Post-menopausal  Other Topics Concern   Not on file  Social History Narrative   Not on file   Social Determinants of Health   Financial Resource Strain: Not on file  Food Insecurity: Not on file  Transportation Needs: Not on file  Physical Activity: Not on file  Stress: Not on file  Social Connections: Not on file  Intimate Partner Violence: Not on file    Subjective: Review of Systems  Constitutional:  Negative for chills and fever.  HENT:  Negative for congestion and hearing loss.   Eyes:  Negative for blurred vision and double vision.  Respiratory:  Negative for cough and shortness of breath.   Cardiovascular:  Negative for chest pain and palpitations.  Gastrointestinal:  Positive for blood in stool, constipation and melena. Negative for abdominal pain, diarrhea, heartburn and vomiting.  Genitourinary:  Negative for dysuria and urgency.  Musculoskeletal:  Negative for joint pain and myalgias.  Skin:  Negative for itching and rash.  Neurological:  Negative for dizziness and headaches.  Psychiatric/Behavioral:  Negative for depression. The patient is not nervous/anxious.       Objective: BP 126/67    Pulse (!) 57    Temp (!) 96.8 F (36 C) (Temporal)    Ht 5\' 4"  (1.626 m)    Wt 145 lb 6.4 oz (66 kg)    BMI 24.96 kg/m  Physical Exam Constitutional:      Appearance: Normal appearance.  HENT:     Head: Normocephalic and atraumatic.  Eyes:     Extraocular Movements: Extraocular movements intact.     Conjunctiva/sclera: Conjunctivae normal.   Cardiovascular:     Rate and Rhythm: Normal rate and regular rhythm.  Pulmonary:     Effort: Pulmonary effort is normal.     Breath sounds: Normal breath sounds.  Abdominal:     General: Bowel sounds are normal.     Palpations: Abdomen is soft.  Musculoskeletal:        General: No swelling. Normal range of motion.     Cervical back: Normal range of motion and neck supple.  Skin:    General: Skin is warm and dry.     Coloration: Skin is not  jaundiced.  Neurological:     General: No focal deficit present.     Mental Status: She is alert and oriented to person, place, and time.  Psychiatric:        Mood and Affect: Mood normal.        Behavior: Behavior normal.     Assessment: *Melena and rectal bleeding *Weight loss *Constipation *High risk medication use  Plan: Discussed patient's symptoms in depth with her today.  She has multiple alarm symptoms including GI bleeding and weight loss.  Will schedule for EGD to evaluate for peptic ulcer disease, esophagitis, gastritis, H. Pylori, duodenitis, or other. Will also evaluate for esophageal stricture, Schatzki's ring, esophageal web or other.   At this time, we will perform colonoscopy to rule out bleeding sources such as hemorrhoids, diverticulosis, AVMs, polyps, malignancy, or other.  The risks including infection, bleed, or perforation as well as benefits, limitations, alternatives and imponderables have been reviewed with the patient. Potential for esophageal dilation, biopsy, etc. have also been reviewed.  Questions have been answered. All parties agreeable.  Patient will need to hold her clopidogrel x5 days prior to procedures.  Discussed that there is a slightly increased risk of cardiovascular events during this time and she understands.  For her constipation, I recommend she start taking MiraLAX 1 capful every morning.  If this is not adequate she can increase to 2 capfuls daily.  If this is still not adequate she can add  on once daily Dulcolax.  Recommend she increase fiber in her diet or take over-the-counter Benefiber/Metamucil.  Also recommend she drink at least 4 to 6 glasses of water daily.  Thank you Chevis Pretty for the kind referral.  05/16/2021 9:17 AM   Disclaimer: This note was dictated with voice recognition software. Similar sounding words can inadvertently be transcribed and may not be corrected upon review.

## 2021-05-16 NOTE — Patient Instructions (Signed)
We will schedule you for upper endoscopy and colonoscopy to further evaluate your black stools, rectal bleeding, weight loss.  For your constipation, I want you to start taking over the counter MiraLAX (polyethylene glycol) 1 capful daily.  If this does not adequately control your constipation, I would increase to 2 capfuls daily.  If this is still not adequate, then I would add on once daily Dulcolax (bisacodyl) tablet.   I also recommend increasing fiber in your diet or adding OTC Benefiber/Metamucil. Be sure to drink at least 4 to 6 glasses of water daily.   It was very nice meeting you today.  Dr. Abbey Chatters  At [facility] we value your feedback. You may receive a survey about your visit today. Please share your experience as we strive to create trusting relationships with our patients to provide genuine, compassionate, quality care.  We appreciate your understanding and patience as we review any laboratory studies, imaging, and other diagnostic tests that are ordered as we care for you. Our office policy is 5 business days for review of these results, and any emergent or urgent results are addressed in a timely manner for your best interest. If you do not hear from our office in 1 week, please contact us.   We also encourage the use of MyChart, which contains your medical information for your review as well. If you are not enrolled in this feature, an access code is on this after visit summary for your convenience. Thank you for allowing [place] to be involved in your care.  It was great to see you today!  I hope you have a great rest of your Winter!    Elon Alas. Abbey Chatters, D.O. Gastroenterology and Hepatology [facility]

## 2021-05-17 ENCOUNTER — Telehealth: Payer: Self-pay

## 2021-05-17 ENCOUNTER — Telehealth: Payer: Self-pay | Admitting: Nurse Practitioner

## 2021-05-17 NOTE — Patient Instructions (Signed)
Teresa Holloway  05/17/2021     @PREFPERIOPPHARMACY @   Your procedure is scheduled on  05/24/2021.   Report to [facility] at  0900 A.M.   Call this number if you have problems the morning of surgery:  [phone]   Remember:  Follow the diet and prep instructions given to you by the office.    Take these medicines the morning of surgery with A SIP OF WATER        amlodipine, lexapro, metoprolol, tramadol (if needed).     Do not wear jewelry, make-up or nail polish.  Do not wear lotions, powders, or perfumes, or deodorant.  Do not shave 48 hours prior to surgery.  Men may shave face and neck.  Do not bring valuables to the hospital.  [facility] is not responsible for any belongings or valuables.  Contacts, dentures or bridgework may not be worn into surgery.  Leave your suitcase in the car.  After surgery it may be brought to your room.  For patients admitted to the hospital, discharge time will be determined by your treatment team.  Patients discharged the day of surgery will not be allowed to drive home and must have someone with them for 24 hours.    Special instructions:   DO NOT smoke tobacco or vape for 24 hours before your procedure.  Please read over the following fact sheets that you were given. Anesthesia Post-op Instructions and Care and Recovery After Surgery      Upper Endoscopy, Adult, Care After This sheet gives you information about how to care for yourself after your procedure. Your health care provider may also give you more specific instructions. If you have problems or questions, contact your health care provider. What can I expect after the procedure? After the procedure, it is common to have: A sore throat. Mild stomach pain or discomfort. Bloating. Nausea. Follow these instructions at home:  Follow instructions from your health care provider about what to eat or drink after your procedure. Return to your normal activities as  told by your health care provider. Ask your health care provider what activities are safe for you. Take over-the-counter and prescription medicines only as told by your health care provider. If you were given a sedative during the procedure, it can affect you for several hours. Do not drive or operate machinery until your health care provider says that it is safe. Keep all follow-up visits as told by your health care provider. This is important. Contact a health care provider if you have: A sore throat that lasts longer than one day. Trouble swallowing. Get help right away if: You vomit blood or your vomit looks like coffee grounds. You have: A fever. Bloody, black, or tarry stools. A severe sore throat or you cannot swallow. Difficulty breathing. Severe pain in your chest or abdomen. Summary After the procedure, it is common to have a sore throat, mild stomach discomfort, bloating, and nausea. If you were given a sedative during the procedure, it can affect you for several hours. Do not drive or operate machinery until your health care provider says that it is safe. Follow instructions from your health care provider about what to eat or drink after your procedure. Return to your normal activities as told by your health care provider. This information is not intended to replace advice given to you by your health care provider. Make sure you discuss any questions you have with your  health care provider. Document Revised: 02/12/2019 Document Reviewed: 09/08/2017 Elsevier Patient Education  2022 [place]. Colonoscopy, Adult, Care After This sheet gives you information about how to care for yourself after your procedure. Your health care provider may also give you more specific instructions. If you have problems or questions, contact your health care provider. What can I expect after the procedure? After the procedure, it is common to have: A small amount of blood in your stool for 24  hours after the procedure. Some gas. Mild cramping or bloating of your abdomen. Follow these instructions at home: Eating and drinking  Drink enough fluid to keep your urine pale yellow. Follow instructions from your health care provider about eating or drinking restrictions. Resume your normal diet as instructed by your health care provider. Avoid heavy or fried foods that are hard to digest. Activity Rest as told by your health care provider. Avoid sitting for a long time without moving. Get up to take short walks every 1-2 hours. This is important to improve blood flow and breathing. Ask for help if you feel weak or unsteady. Return to your normal activities as told by your health care provider. Ask your health care provider what activities are safe for you. Managing cramping and bloating  Try walking around when you have cramps or feel bloated. Apply heat to your abdomen as told by your health care provider. Use the heat source that your health care provider recommends, such as a moist heat pack or a heating pad. Place a towel between your skin and the heat source. Leave the heat on for 20-30 minutes. Remove the heat if your skin turns bright red. This is especially important if you are unable to feel pain, heat, or cold. You may have a greater risk of getting burned. General instructions If you were given a sedative during the procedure, it can affect you for several hours. Do not drive or operate machinery until your health care provider says that it is safe. For the first 24 hours after the procedure: Do not sign important documents. Do not drink alcohol. Do your regular daily activities at a slower pace than normal. Eat soft foods that are easy to digest. Take over-the-counter and prescription medicines only as told by your health care provider. Keep all follow-up visits as told by your health care provider. This is important. Contact a health care provider if: You have blood in  your stool 2-3 days after the procedure. Get help right away if you have: More than a small spotting of blood in your stool. Large blood clots in your stool. Swelling of your abdomen. Nausea or vomiting. A fever. Increasing pain in your abdomen that is not relieved with medicine. Summary After the procedure, it is common to have a small amount of blood in your stool. You may also have mild cramping and bloating of your abdomen. If you were given a sedative during the procedure, it can affect you for several hours. Do not drive or operate machinery until your health care provider says that it is safe. Get help right away if you have a lot of blood in your stool, nausea or vomiting, a fever, or increased pain in your abdomen. This information is not intended to replace advice given to you by your health care provider. Make sure you discuss any questions you have with your health care provider. Document Revised: 02/12/2019 Document Reviewed: 11/02/2018 Elsevier Patient Education  [place] After This  sheet gives you information about how to care for yourself after your procedure. Your health care provider may also give you more specific instructions. If you have problems or questions, contact your health care provider. What can I expect after the procedure? After the procedure, it is common to have: Tiredness. Forgetfulness about what happened after the procedure. Impaired judgment for important decisions. Nausea or vomiting. Some difficulty with balance. Follow these instructions at home: For the time period you were told by your health care provider:   Rest as needed. Do not participate in activities where you could fall or become injured. Do not drive or use machinery. Do not drink alcohol. Do not take sleeping pills or medicines that cause drowsiness. Do not make important decisions or sign legal documents. Do not take care of children on  your own. Eating and drinking Follow the diet that is recommended by your health care provider. Drink enough fluid to keep your urine pale yellow. If you vomit: Drink water, juice, or soup when you can drink without vomiting. Make sure you have little or no nausea before eating solid foods. General instructions Have a responsible adult stay with you for the time you are told. It is important to have someone help care for you until you are awake and alert. Take over-the-counter and prescription medicines only as told by your health care provider. If you have sleep apnea, surgery and certain medicines can increase your risk for breathing problems. Follow instructions from your health care provider about wearing your sleep device: Anytime you are sleeping, including during daytime naps. While taking prescription pain medicines, sleeping medicines, or medicines that make you drowsy. Avoid smoking. Keep all follow-up visits as told by your health care provider. This is important. Contact a health care provider if: You keep feeling nauseous or you keep vomiting. You feel light-headed. You are still sleepy or having trouble with balance after 24 hours. You develop a rash. You have a fever. You have redness or swelling around the IV site. Get help right away if: You have trouble breathing. You have new-onset confusion at home. Summary For several hours after your procedure, you may feel tired. You may also be forgetful and have poor judgment. Have a responsible adult stay with you for the time you are told. It is important to have someone help care for you until you are awake and alert. Rest as told. Do not drive or operate machinery. Do not drink alcohol or take sleeping pills. Get help right away if you have trouble breathing, or if you suddenly become confused. This information is not intended to replace advice given to you by your health care provider. Make sure you discuss any questions  you have with your health care provider. Document Revised: 12/23/2019 Document Reviewed: 03/11/2019 Elsevier Patient Education  2022 [employer].

## 2021-05-17 NOTE — Telephone Encounter (Signed)
Returned the phone call of this pt's son Steffi Noviello) and he has concerns regarding his mother being scheduled for a colonoscopy. He states at her age is it really necessary to have this procedure done? Due to the pt's age she now has dementia and he is having to stay with her. Also he states his mother had a knee surgery 2 years ago and he had heard that putting people under with dementia does not work good. The pt's son is not on board with this and he wants to speak with you regarding this. His number is [phone].

## 2021-05-17 NOTE — Telephone Encounter (Signed)
°  Prescription Request  05/17/2021  Is this a "Controlled Substance" medicine? no  Have you seen your PCP in the last 2 weeks? no  If YES, route message to pool  -  If NO, patient needs to be scheduled for appointment.  What is the name of the medication or equipment? amLODipine (NORVASC) 5 MG tablet  Have you contacted your pharmacy to request a refill? Yes, they said they can't fill until doctor approves   Which pharmacy would you like this sent to? [facility] in [place]   Patient notified that their request is being sent to the clinical staff for review and that they should receive a response within 2 business days.

## 2021-05-18 NOTE — Telephone Encounter (Signed)
Pharmacy SAYS IT IS THERE WAITING FOR HER.

## 2021-05-21 ENCOUNTER — Telehealth: Payer: Self-pay | Admitting: Internal Medicine

## 2021-05-21 NOTE — Telephone Encounter (Signed)
TCS/EGD scheduled now for 05/24/21.  Called pt, she wants to wait until March to do procedure. Advised her we will call to reschedule procedure when March schedule is available. Endo scheduler informed.

## 2021-05-21 NOTE — Telephone Encounter (Signed)
Patient wants to reschedule her procedure

## 2021-05-22 ENCOUNTER — Encounter (HOSPITAL_COMMUNITY)
Admission: RE | Admit: 2021-05-22 | Discharge: 2021-05-22 | Disposition: A | Discharge status: Home / Self Care | Payer: PRIVATE HEALTH INSURANCE | Source: Ambulatory Visit | Attending: Internal Medicine | Admitting: Internal Medicine

## 2021-05-22 ENCOUNTER — Encounter (HOSPITAL_COMMUNITY): Payer: Self-pay

## 2021-05-24 ENCOUNTER — Ambulatory Visit (HOSPITAL_COMMUNITY): Admission: RE | Admit: 2021-05-24 | Payer: Medicare Other | Source: Home / Self Care

## 2021-05-24 ENCOUNTER — Encounter (HOSPITAL_COMMUNITY): Admission: RE | Payer: Self-pay | Source: Home / Self Care

## 2021-05-24 SURGERY — COLONOSCOPY WITH PROPOFOL
Anesthesia: Monitor Anesthesia Care

## 2021-05-30 NOTE — Telephone Encounter (Signed)
LMTCB for pt 

## 2021-06-04 NOTE — Telephone Encounter (Signed)
Letter mailed

## 2021-06-07 ENCOUNTER — Ambulatory Visit (INDEPENDENT_AMBULATORY_CARE_PROVIDER_SITE_OTHER): Payer: Medicare Other | Admitting: Nurse Practitioner

## 2021-06-07 ENCOUNTER — Encounter: Payer: Self-pay | Admitting: Nurse Practitioner

## 2021-06-07 VITALS — BP 113/56 | HR 49 | Temp 98.4°F | Resp 20 | Ht 64.0 in | Wt 142.0 lb

## 2021-06-07 DIAGNOSIS — M170 Bilateral primary osteoarthritis of knee: Secondary | ICD-10-CM

## 2021-06-07 DIAGNOSIS — L989 Disorder of the skin and subcutaneous tissue, unspecified: Secondary | ICD-10-CM | POA: Diagnosis not present

## 2021-06-07 DIAGNOSIS — D485 Neoplasm of uncertain behavior of skin: Secondary | ICD-10-CM | POA: Diagnosis not present

## 2021-06-07 MED ORDER — TRAMADOL HCL 50 MG PO TABS: 50.0000 mg | ORAL_TABLET | Freq: Two times a day (BID) | ORAL | 2 refills | Status: DC

## 2021-06-07 NOTE — Progress Notes (Signed)
Subjective:    Patient ID: Teresa Holloway, female    DOB: 09-26-1940, 81 y.o.   MRN: [id number]   Chief Complaint: Pain management  HPI  Pain assessment: Cause of pain- osteoarthriis Pain location- all over joints Pain on scale of 1-10- 5/10 currently Frequency- daily What increases pain-walking and standing What makes pain Better-pain meds help Effects on ADL - none Any change in general medical condition-none  Current opioids rx- ultram BID # meds rx- 60 Effectiveness of current meds-helps Adverse reactions from pain meds-none Morphine equivalent-  Pill count performed-No Last drug screen - 03/07/20 ( high risk q27m, moderate risk q84m, low risk yearly ) Urine drug screen today- No Was the NCCSR reviewed- yes  If yes were their any concerning findings? - no   Overdose risk: 1   Pain contract signed on:09/04/20    Review of Systems  Constitutional:  Negative for diaphoresis.  Eyes:  Negative for pain.  Respiratory:  Negative for shortness of breath.   Cardiovascular:  Negative for chest pain, palpitations and leg swelling.  Gastrointestinal:  Negative for abdominal pain.  Endocrine: Negative for polydipsia.  Skin:  Negative for rash.  Neurological:  Negative for dizziness, weakness and headaches.  Hematological:  Does not bruise/bleed easily.  All other systems reviewed and are negative.     Objective:   Physical Exam Vitals and nursing note reviewed.  Constitutional:      General: She is not in acute distress.    Appearance: Normal appearance. She is well-developed.  Neck:     Vascular: No carotid bruit or JVD.  Cardiovascular:     Rate and Rhythm: Normal rate and regular rhythm.     Heart sounds: Normal heart sounds.  Pulmonary:     Effort: Pulmonary effort is normal. No respiratory distress.     Breath sounds: Normal breath sounds. No wheezing or rales.  Chest:     Chest wall: No tenderness.  Abdominal:     General: Bowel sounds are  normal. There is no distension or abdominal bruit.     Palpations: Abdomen is soft. There is no hepatomegaly, splenomegaly, mass or pulsatile mass.     Tenderness: There is no abdominal tenderness.  Musculoskeletal:        General: Normal range of motion.  Lymphadenopathy:     Cervical: No cervical adenopathy.  Skin:    General: Skin is warm and dry.     Comments: Lack seedy looking lesion on left wrist  Neurological:     Mental Status: She is alert and oriented to person, place, and time.     Deep Tendon Reflexes: Reflexes are normal and symmetric.  Psychiatric:        Behavior: Behavior normal.        Thought Content: Thought content normal.        Judgment: Judgment normal.   BP (!) 113/56    Pulse (!) 49    Temp 98.4 F (36.9 C) (Temporal)    Resp 20    Ht 5\' 4"  (1.626 m)    Wt 142 lb (64.4 kg)    SpO2 98%    BMI 24.37 kg/m          Assessment & Plan:   Teresa Holloway in today with chief complaint of Pain Management   1. Arthritis of both knees Rest Moist heat - traMADol (ULTRAM) 50 MG tablet; Take 1 tablet (50 mg total) by mouth 2 (two) times daily.  Dispense: 60  tablet; Refill: 2  2. Lesion of skin of wrist [facility] samples- pending results    The above assessment and management plan was discussed with the patient. The patient verbalized understanding of and has agreed to the management plan. Patient is aware to call the clinic if symptoms persist or worsen. Patient is aware when to return to the clinic for a follow-up visit. Patient educated on when it is appropriate to go to the emergency department.   Mary-Margaret Daphine Deutscher, FNP

## 2021-06-25 ENCOUNTER — Telehealth: Payer: Self-pay | Admitting: Internal Medicine

## 2021-06-25 NOTE — Telephone Encounter (Signed)
PATIENT CALLED BACK AND WANTS TO SCHEDULE HER COLONOSCOPY NOW.   ?

## 2021-06-26 MED ORDER — PEG 3350-KCL-NA BICARB-NACL 420 G PO SOLR: 4000.0000 mL | ORAL | 0 refills | Status: DC

## 2021-06-26 NOTE — Telephone Encounter (Signed)
Called pt, TCS/EGD w/Propofol ASA 3 w/Dr. Abbey Chatters rescheduled to 07/16/21 at 10:30am. Advised her to hold Plavix for 5 days prior to procedure. Rx for prep resent to pharmacy. Orders entered. ?

## 2021-06-26 NOTE — Telephone Encounter (Signed)
Pre-op appt 07/11/21. Appt letter mailed with procedure instructions. ?

## 2021-06-26 NOTE — Addendum Note (Signed)
Addended by: Hassan Rowan on: 06/26/2021 08:16 AM ? ? Modules accepted: Orders ? ?

## 2021-07-04 ENCOUNTER — Ambulatory Visit (INDEPENDENT_AMBULATORY_CARE_PROVIDER_SITE_OTHER): Payer: Medicare Other

## 2021-07-04 VITALS — Ht 64.0 in | Wt 142.0 lb

## 2021-07-04 DIAGNOSIS — Z0000 Encounter for general adult medical examination without abnormal findings: Secondary | ICD-10-CM | POA: Diagnosis not present

## 2021-07-04 DIAGNOSIS — Z780 Asymptomatic menopausal state: Secondary | ICD-10-CM | POA: Diagnosis not present

## 2021-07-04 NOTE — Patient Instructions (Signed)
Teresa Holloway , ?Thank you for taking time to come for your Medicare Wellness Visit. I appreciate your ongoing commitment to your health goals. Please review the following plan we discussed and let me know if I can assist you in the future.  ? ?Screening recommendations/referrals: ?Colonoscopy: Ordered. ?Mammogram: Done 09/24/2013, no longer required. ?Bone Density: Ordered 07/04/2021. ? ?Recommended yearly ophthalmology/optometry visit for glaucoma screening and checkup ?Recommended yearly dental visit for hygiene and checkup ? ?Vaccinations: ?Influenza vaccine: Done 03/08/2021 Repeat annually ? ?Pneumococcal vaccine: Done 07/01/14 and 07/14/15.  ?Tdap vaccine: Done 06/16/20. Repeat in 10 years ? ?Shingles vaccine: Zoster done 12/30/2013. Shingrix discussed. ?   ?Covid-19:Done 06/23/19, 07/21/19 and 05/16/20. ? ?Advanced directives: Advance directive discussed with you today. Even though you declined this today, please call our office should you change your mind, and we can give you the proper paperwork for you to fill out. ? ? ?Conditions/risks identified: Aim for 30 minutes of exercise or brisk walking, 6-8 glasses of water, and 5 servings of fruits and vegetables each day. ? ? ?Next appointment: Follow up in one year for your annual wellness visit 2024. ? ? ?Preventive Care 44 Years and Older, Female ?Preventive care refers to lifestyle choices and visits with your health care provider that can promote health and wellness. ?What does preventive care include? ?A yearly physical exam. This is also called an annual well check. ?Dental exams once or twice a year. ?Routine eye exams. Ask your health care provider how often you should have your eyes checked. ?Personal lifestyle choices, including: ?Daily care of your teeth and gums. ?Regular physical activity. ?Eating a healthy diet. ?Avoiding tobacco and drug use. ?Limiting alcohol use. ?Practicing safe sex. ?Taking low-dose aspirin every day. ?Taking vitamin and mineral  supplements as recommended by your health care provider. ?What happens during an annual well check? ?The services and screenings done by your health care provider during your annual well check will depend on your age, overall health, lifestyle risk factors, and family history of disease. ?Counseling  ?Your health care provider may ask you questions about your: ?Alcohol use. ?Tobacco use. ?Drug use. ?Emotional well-being. ?Home and relationship well-being. ?Sexual activity. ?Eating habits. ?History of falls. ?Memory and ability to understand (cognition). ?Work and work Statistician. ?Reproductive health. ?Screening  ?You may have the following tests or measurements: ?Height, weight, and BMI. ?Blood pressure. ?Lipid and cholesterol levels. These may be checked every 5 years, or more frequently if you are over 68 years old. ?Skin check. ?Lung cancer screening. You may have this screening every year starting at age 23 if you have a 30-pack-year history of smoking and currently smoke or have quit within the past 15 years. ?Fecal occult blood test (FOBT) of the stool. You may have this test every year starting at age 55. ?Flexible sigmoidoscopy or colonoscopy. You may have a sigmoidoscopy every 5 years or a colonoscopy every 10 years starting at age 84. ?Hepatitis C blood test. ?Hepatitis B blood test. ?Sexually transmitted disease (STD) testing. ?Diabetes screening. This is done by checking your blood sugar (glucose) after you have not eaten for a while (fasting). You may have this done every 1-3 years. ?Bone density scan. This is done to screen for osteoporosis. You may have this done starting at age 22. ?Mammogram. This may be done every 1-2 years. Talk to your health care provider about how often you should have regular mammograms. ?Talk with your health care provider about your test results, treatment options, and if necessary,  the need for more tests. ?Vaccines  ?Your health care provider may recommend certain  vaccines, such as: ?Influenza vaccine. This is recommended every year. ?Tetanus, diphtheria, and acellular pertussis (Tdap, Td) vaccine. You may need a Td booster every 10 years. ?Zoster vaccine. You may need this after age 66. ?Pneumococcal 13-valent conjugate (PCV13) vaccine. One dose is recommended after age 37. ?Pneumococcal polysaccharide (PPSV23) vaccine. One dose is recommended after age 70. ?Talk to your health care provider about which screenings and vaccines you need and how often you need them. ?This information is not intended to replace advice given to you by your health care provider. Make sure you discuss any questions you have with your health care provider. ?Document Released: 05/05/2015 Document Revised: 12/27/2015 Document Reviewed: 02/07/2015 ?Elsevier Interactive Patient Education ? 2017 [place]. ? ?Fall Prevention in the Home ?Falls can cause injuries. They can happen to people of all ages. There are many things you can do to make your home safe and to help prevent falls. ?What can I do on the outside of my home? ?Regularly fix the edges of walkways and driveways and fix any cracks. ?Remove anything that might make you trip as you walk through a door, such as a raised step or threshold. ?Trim any bushes or trees on the path to your home. ?Use bright outdoor lighting. ?Clear any walking paths of anything that might make someone trip, such as rocks or tools. ?Regularly check to see if handrails are loose or broken. Make sure that both sides of any steps have handrails. ?Any raised decks and porches should have guardrails on the edges. ?Have any leaves, snow, or ice cleared regularly. ?Use sand or salt on walking paths during winter. ?Clean up any spills in your garage right away. This includes oil or grease spills. ?What can I do in the bathroom? ?Use night lights. ?Install grab bars by the toilet and in the tub and shower. Do not use towel bars as grab bars. ?Use non-skid mats or decals in  the tub or shower. ?If you need to sit down in the shower, use a plastic, non-slip stool. ?Keep the floor dry. Clean up any water that spills on the floor as soon as it happens. ?Remove soap buildup in the tub or shower regularly. ?Attach bath mats securely with double-sided non-slip rug tape. ?Do not have throw rugs and other things on the floor that can make you trip. ?What can I do in the bedroom? ?Use night lights. ?Make sure that you have a light by your bed that is easy to reach. ?Do not use any sheets or blankets that are too big for your bed. They should not hang down onto the floor. ?Have a firm chair that has side arms. You can use this for support while you get dressed. ?Do not have throw rugs and other things on the floor that can make you trip. ?What can I do in the kitchen? ?Clean up any spills right away. ?Avoid walking on wet floors. ?Keep items that you use a lot in easy-to-reach places. ?If you need to reach something above you, use a strong step stool that has a grab bar. ?Keep electrical cords out of the way. ?Do not use floor polish or wax that makes floors slippery. If you must use wax, use non-skid floor wax. ?Do not have throw rugs and other things on the floor that can make you trip. ?What can I do with my stairs? ?Do not leave any items on  the stairs. ?Make sure that there are handrails on both sides of the stairs and use them. Fix handrails that are broken or loose. Make sure that handrails are as long as the stairways. ?Check any carpeting to make sure that it is firmly attached to the stairs. Fix any carpet that is loose or worn. ?Avoid having throw rugs at the top or bottom of the stairs. If you do have throw rugs, attach them to the floor with carpet tape. ?Make sure that you have a light switch at the top of the stairs and the bottom of the stairs. If you do not have them, ask someone to add them for you. ?What else can I do to help prevent falls? ?Wear shoes that: ?Do not have high  heels. ?Have rubber bottoms. ?Are comfortable and fit you well. ?Are closed at the toe. Do not wear sandals. ?If you use a stepladder: ?Make sure that it is fully opened. Do not climb a closed stepladder. ?Make sure

## 2021-07-04 NOTE — Progress Notes (Signed)
? ?Subjective:  ? Teresa Holloway is a 81 y.o. female who presents for Medicare Annual (Subsequent) preventive examination. ?Virtual Visit via Telephone Note ? ?I connected with  Garrison Columbus on 07/04/21 at  2:45 PM EDT by telephone and verified that I am speaking with the correct person using two identifiers. ? ?Location: ?Patient: HOME ?Provider: [facility] ?Persons participating in the virtual visit: patient/Nurse Health Advisor ?  ?I discussed the limitations, risks, security and privacy concerns of performing an evaluation and management service by telephone and the availability of in person appointments. The patient expressed understanding and agreed to proceed. ? ?Interactive audio and video telecommunications were attempted between this nurse and patient, however failed, due to patient having technical difficulties OR patient did not have access to video capability.  We continued and completed visit with audio only. ? ?Some vital signs may be absent or patient reported.  ? ?Chriss Driver, LPN ? ?Review of Systems    ? ?Cardiac Risk Factors include: advanced age (>3mn, >>48women);hypertension;dyslipidemia;sedentary lifestyle ? ?   ?Objective:  ?  ?Today's Vitals  ? 07/04/21 1400  ?Weight: 142 lb (64.4 kg)  ?Height: '5\' 4"'$  (1.626 m)  ? ?Body mass index is 24.37 kg/m?. ? ?Advanced Directives 07/04/2021 05/18/2020 01/25/2019 01/18/2019 09/18/2018 11/06/2015  ?Does Patient Have a Medical Advance Directive? No No No No No No  ?Would patient like information on creating a medical advance directive? No - Patient declined No - Patient declined No - Patient declined - No - Patient declined -  ? ? ?Current Medications (verified) ?Outpatient Encounter Medications as of 07/04/2021  ?Medication Sig  ? amLODipine (NORVASC) 5 MG tablet Take 1 tablet (5 mg total) by mouth daily.  ? aspirin EC 81 MG tablet Take 1 tablet (81 mg total) by mouth 2 (two) times daily.  ? atorvastatin (LIPITOR) 40 MG tablet TAKE 1 TABLET BY MOUTH  DAILY AT 6 PM.  ? benazepril (LOTENSIN) 40 MG tablet Take 1 tablet (40 mg total) by mouth daily.  ? brimonidine (ALPHAGAN) 0.2 % ophthalmic solution Place 1 drop into both eyes 2 (two) times daily.  ? cholecalciferol (VITAMIN D) 1000 UNITS tablet Take 1,000 Units by mouth daily.  ? clopidogrel (PLAVIX) 75 MG tablet Take 1 tablet (75 mg total) by mouth daily.  ? diclofenac sodium (VOLTAREN) 1 % GEL Apply 2 g topically 4 (four) times daily. (Patient taking differently: Apply 2 g topically 4 (four) times daily as needed (pain).)  ? escitalopram (LEXAPRO) 20 MG tablet Take 1 tablet (20 mg total) by mouth daily.  ? hydrochlorothiazide (HYDRODIURIL) 25 MG tablet Take 1 tablet (25 mg total) by mouth daily.  ? isosorbide mononitrate (IMDUR) 60 MG 24 hr tablet TAKE 1 TABLET BY MOUTH EVERYDAY AT BEDTIME  ? metoprolol succinate (TOPROL-XL) 50 MG 24 hr tablet Take 1 tablet (50 mg total) by mouth daily. Take with or immediately following a meal.  ? nitroGLYCERIN (NITROSTAT) 0.4 MG SL tablet Place 1 tablet (0.4 mg total) under the tongue every 5 (five) minutes as needed for chest pain.  ? Phenazopyridine HCl (AZO TABS PO) Take 1 tablet by mouth daily.  ? polyethylene glycol-electrolytes (NULYTELY) 420 g solution As directed  ? polyethylene glycol-electrolytes (TRILYTE) 420 g solution Take 4,000 mLs by mouth as directed.  ? timolol (TIMOPTIC) 0.5 % ophthalmic solution   ? traMADol (ULTRAM) 50 MG tablet Take 1 tablet (50 mg total) by mouth 2 (two) times daily.  ? ?No facility-administered encounter medications  on file as of 07/04/2021.  ? ? ?Allergies (verified) ?Atarax [hydroxyzine], Bactrim [sulfamethoxazole-trimethoprim], and Coreg [carvedilol]  ? ?History: ?Past Medical History:  ?Diagnosis Date  ? Anxiety   ? Arthritis   ? Knees, hips  ? CAD (coronary artery disease)   ? Chronic back pain   ? Depression   ? GERD (gastroesophageal reflux disease)   ? Hyperlipidemia   ? Hypertension   ? MI (myocardial infarction) ([place])   ?  Osteopenia   ? ?Past Surgical History:  ?Procedure Laterality Date  ? ECTOPIC PREGNANCY SURGERY    ? left breast biospy    ? left wrist fracture    ? TOTAL KNEE ARTHROPLASTY Left 01/25/2019  ? Procedure: LEFT TOTAL KNEE ARTHROPLASTY;  Surgeon: Frederik Pear, MD;  Location: [facility];  Service: Orthopedics;  Laterality: Left;  ? ?Family History  ?Problem Relation Age of Onset  ? Asthma Father   ? Diabetes Sister   ? Diabetes Brother   ? ?Social History  ? ?Socioeconomic History  ? Marital status: Widowed  ?  Spouse name: Octavia Bruckner  ? Number of children: 1  ? Years of education: Not on file  ? Highest education level: 10th grade  ?Occupational History  ? Occupation: Retired  ?Tobacco Use  ? Smoking status: Never  ? Smokeless tobacco: Never  ?Vaping Use  ? Vaping Use: Never used  ?Substance and Sexual Activity  ? Alcohol use: No  ? Drug use: No  ? Sexual activity: Not Currently  ?  Birth control/protection: Post-menopausal  ?Other Topics Concern  ? Not on file  ?Social History Narrative  ? Husband passed 12/2020.   ? 1 son-[name].  ? 2 grandchildren  ? Twin great grandchildren.  ? ?Social Determinants of Health  ? ?Financial Resource Strain: Low Risk   ? Difficulty of Paying Living Expenses: Not hard at all  ?Food Insecurity: No Food Insecurity  ? Worried About Charity fundraiser in the Last Year: Never true  ? Ran Out of Food in the Last Year: Never true  ?Transportation Needs: No Transportation Needs  ? Lack of Transportation (Medical): No  ? Lack of Transportation (Non-Medical): No  ?Physical Activity: Insufficiently Active  ? Days of Exercise per Week: 5 days  ? Minutes of Exercise per Session: 20 min  ?Stress: No Stress Concern Present  ? Feeling of Stress : Only a little  ?Social Connections: Moderately Isolated  ? Frequency of Communication with Friends and Family: More than three times a week  ? Frequency of Social Gatherings with Friends and Family: More than three times a week  ? Attends Religious Services: 1 to 4 times  per year  ? Active Member of Clubs or Organizations: No  ? Attends Archivist Meetings: Never  ? Marital Status: Widowed  ? ? ?Tobacco Counseling ?Counseling given: Not Answered ? ? ?Clinical Intake: ? ?Pre-visit preparation completed: Yes ? ?Pain : No/denies pain ? ?  ? ?BMI - recorded: 24.37 ?Nutritional Status: BMI of 19-24  Normal ?Nutritional Risks: None ?Diabetes: No ? ?How often do you need to have someone help you when you read instructions, pamphlets, or other written materials from your doctor or pharmacy?: 1 - Never ? ?Diabetic?NO ? ?Interpreter Needed?: No ? ?Information entered by :: mj Meily Glowacki, lpn ? ? ?Activities of Daily Living ?In your present state of health, do you have any difficulty performing the following activities: 07/04/2021  ?Hearing? N  ?Vision? N  ?Difficulty concentrating or making decisions? N  ?  Comment Memory  ?Walking or climbing stairs? N  ?Dressing or bathing? N  ?Doing errands, shopping? N  ?Preparing Food and eating ? N  ?Using the Toilet? N  ?In the past six months, have you accidently leaked urine? N  ?Do you have problems with loss of bowel control? N  ?Managing your Medications? N  ?Managing your Finances? N  ?Housekeeping or managing your Housekeeping? N  ?Some recent data might be hidden  ? ? ?Patient Care Team: ?Chevis Pretty, FNP as PCP - General (Nurse Practitioner) ?Ilean China, RN as Equities trader ?[place] ? ?Indicate any recent Medical Services you may have received from other than [facility] providers in the past year (date may be approximate). ? ?   ?Assessment:  ? This is a routine wellness examination for Teresa Holloway. ? ?Hearing/Vision screen ?Hearing Screening - Comments:: No hearing issues.  ?Vision Screening - Comments:: Contacts. [facility]. ? ?Dietary issues and exercise activities discussed: ?Current Exercise Habits: Home exercise routine, Type of exercise: walking, Time (Minutes): 20, Frequency  (Times/Week): 3, Weekly Exercise (Minutes/Week): 60, Intensity: Mild, Exercise limited by: cardiac condition(s) ? ? Goals Addressed   ? ?  ?  ?  ?  ? This Visit's Progress  ?  DIET - INCREASE WATER INTAKE   On track

## 2021-07-09 NOTE — Patient Instructions (Signed)
? ? ? ? ? ? ? ? ? ? Teresa Holloway ? 07/09/2021  ?  ? '@PREFPERIOPPHARMACY'$ @ ? ? Your procedure is scheduled on 07/16/2021. ? ? Report to [facility] at  0830  A.M. ? ? Call this number if you have problems the morning of surgery: ? [phone] ? ? Remember: ? Follow the diet and prep instructions given to you by the office. ? ?Your last dose of plavix should be 07/10/2021. ? ?  ? Take these medicines the morning of surgery with A SIP OF WATER  ? ?amlodipine, lexapro, metoprolol, tramadol(if needed). ? ? ?  ? Do not wear jewelry, make-up or nail polish. ? Do not wear lotions, powders, or perfumes, or deodorant. ? Do not shave 48 hours prior to surgery.  Men may shave face and neck. ? Do not bring valuables to the hospital. ? [facility] is not responsible for any belongings or valuables. ? ?Contacts, dentures or bridgework may not be worn into surgery.  Leave your suitcase in the car.  After surgery it may be brought to your room. ? ?For patients admitted to the hospital, discharge time will be determined by your treatment team. ? ?Patients discharged the day of surgery will not be allowed to drive home and must have someone with them for 24 hours.  ? ? ?Special instructions:   DO NOT smoke tobacco or vape for 24 hours before your procedure. ? ?Please read over the following fact sheets that you were given. ?Anesthesia Post-op Instructions and Care and Recovery After Surgery ?  ? ? ? Upper Endoscopy, Adult, Care After ?This sheet gives you information about how to care for yourself after your procedure. Your health care provider may also give you more specific instructions. If you have problems or questions, contact your health care provider. ?What can I expect after the procedure? ?After the procedure, it is common to have: ?A sore throat. ?Mild stomach pain or discomfort. ?Bloating. ?Nausea. ?Follow these instructions at home: ? ?Follow instructions from your health care provider about what to eat or drink after  your procedure. ?Return to your normal activities as told by your health care provider. Ask your health care provider what activities are safe for you. ?Take over-the-counter and prescription medicines only as told by your health care provider. ?If you were given a sedative during the procedure, it can affect you for several hours. Do not drive or operate machinery until your health care provider says that it is safe. ?Keep all follow-up visits as told by your health care provider. This is important. ?Contact a health care provider if you have: ?A sore throat that lasts longer than one day. ?Trouble swallowing. ?Get help right away if: ?You vomit blood or your vomit looks like coffee grounds. ?You have: ?A fever. ?Bloody, black, or tarry stools. ?A severe sore throat or you cannot swallow. ?Difficulty breathing. ?Severe pain in your chest or abdomen. ?Summary ?After the procedure, it is common to have a sore throat, mild stomach discomfort, bloating, and nausea. ?If you were given a sedative during the procedure, it can affect you for several hours. Do not drive or operate machinery until your health care provider says that it is safe. ?Follow instructions from your health care provider about what to eat or drink after your procedure. ?Return to your normal activities as told by your health care provider. ?This information is not intended to replace advice given to you by your health care provider. Make sure you  discuss any questions you have with your health care provider. ?Document Revised: 02/12/2019 Document Reviewed: 09/08/2017 ?Elsevier Patient Education ? [place]. ?Colonoscopy, Adult, Care After ?This sheet gives you information about how to care for yourself after your procedure. Your health care provider may also give you more specific instructions. If you have problems or questions, contact your health care provider. ?What can I expect after the procedure? ?After the procedure, it is common to  have: ?A small amount of blood in your stool for 24 hours after the procedure. ?Some gas. ?Mild cramping or bloating of your abdomen. ?Follow these instructions at home: ?Eating and drinking ? ?Drink enough fluid to keep your urine pale yellow. ?Follow instructions from your health care provider about eating or drinking restrictions. ?Resume your normal diet as instructed by your health care provider. Avoid heavy or fried foods that are hard to digest. ?Activity ?Rest as told by your health care provider. ?Avoid sitting for a long time without moving. Get up to take short walks every 1-2 hours. This is important to improve blood flow and breathing. Ask for help if you feel weak or unsteady. ?Return to your normal activities as told by your health care provider. Ask your health care provider what activities are safe for you. ?Managing cramping and bloating ? ?Try walking around when you have cramps or feel bloated. ?Apply heat to your abdomen as told by your health care provider. Use the heat source that your health care provider recommends, such as a moist heat pack or a heating pad. ?Place a towel between your skin and the heat source. ?Leave the heat on for 20-30 minutes. ?Remove the heat if your skin turns bright red. This is especially important if you are unable to feel pain, heat, or cold. You may have a greater risk of getting burned. ?General instructions ?If you were given a sedative during the procedure, it can affect you for several hours. Do not drive or operate machinery until your health care provider says that it is safe. ?For the first 24 hours after the procedure: ?Do not sign important documents. ?Do not drink alcohol. ?Do your regular daily activities at a slower pace than normal. ?Eat soft foods that are easy to digest. ?Take over-the-counter and prescription medicines only as told by your health care provider. ?Keep all follow-up visits as told by your health care provider. This is  important. ?Contact a health care provider if: ?You have blood in your stool 2-3 days after the procedure. ?Get help right away if you have: ?More than a small spotting of blood in your stool. ?Large blood clots in your stool. ?Swelling of your abdomen. ?Nausea or vomiting. ?A fever. ?Increasing pain in your abdomen that is not relieved with medicine. ?Summary ?After the procedure, it is common to have a small amount of blood in your stool. You may also have mild cramping and bloating of your abdomen. ?If you were given a sedative during the procedure, it can affect you for several hours. Do not drive or operate machinery until your health care provider says that it is safe. ?Get help right away if you have a lot of blood in your stool, nausea or vomiting, a fever, or increased pain in your abdomen. ?This information is not intended to replace advice given to you by your health care provider. Make sure you discuss any questions you have with your health care provider. ?Document Revised: 02/12/2019 Document Reviewed: 11/02/2018 ?Elsevier Patient Education ? 2022 [employer] ?Monitored Anesthesia Care, Care After ?This sheet gives you information about how to care for yourself after your procedure. Your health care provider may also give you more specific instructions. If you have problems or questions, contact your health care provider. ?What can I expect after the procedure? ?After the procedure, it is common to have: ?Tiredness. ?Forgetfulness about what happened after the procedure. ?Impaired judgment for important decisions. ?Nausea or vomiting. ?Some difficulty with balance. ?Follow these instructions at home: ?For the time period you were told by your health care provider: ?  ?Rest as needed. ?Do not participate in activities where you could fall or become injured. ?Do not drive or use machinery. ?Do not drink alcohol. ?Do not take sleeping pills or medicines that cause drowsiness. ?Do not make important  decisions or sign legal documents. ?Do not take care of children on your own. ?Eating and drinking ?Follow the diet that is recommended by your health care provider. ?Drink enough fluid to keep your urine pale yellow. ?If

## 2021-07-11 ENCOUNTER — Encounter (HOSPITAL_COMMUNITY)
Admission: RE | Admit: 2021-07-11 | Discharge: 2021-07-11 | Disposition: A | Discharge status: Home / Self Care | Payer: Medicare Other | Source: Ambulatory Visit | Attending: Internal Medicine | Admitting: Internal Medicine

## 2021-07-11 DIAGNOSIS — Z01810 Encounter for preprocedural cardiovascular examination: Secondary | ICD-10-CM | POA: Diagnosis not present

## 2021-07-16 ENCOUNTER — Ambulatory Visit (HOSPITAL_COMMUNITY): Payer: Medicare Other | Admitting: Anesthesiology

## 2021-07-16 ENCOUNTER — Other Ambulatory Visit: Payer: Self-pay

## 2021-07-16 ENCOUNTER — Encounter (HOSPITAL_COMMUNITY)
Admission: RE | Disposition: A | Discharge status: Home / Self Care | Payer: Self-pay | Source: Home / Self Care | Attending: Internal Medicine

## 2021-07-16 ENCOUNTER — Ambulatory Visit (HOSPITAL_COMMUNITY)
Admission: RE | Admit: 2021-07-16 | Discharge: 2021-07-16 | Disposition: A | Discharge status: Home / Self Care | Payer: Medicare Other | Attending: Internal Medicine | Admitting: Internal Medicine

## 2021-07-16 ENCOUNTER — Ambulatory Visit (HOSPITAL_BASED_OUTPATIENT_CLINIC_OR_DEPARTMENT_OTHER): Payer: Medicare Other | Admitting: Anesthesiology

## 2021-07-16 ENCOUNTER — Encounter (HOSPITAL_COMMUNITY): Payer: Self-pay

## 2021-07-16 DIAGNOSIS — F418 Other specified anxiety disorders: Secondary | ICD-10-CM | POA: Insufficient documentation

## 2021-07-16 DIAGNOSIS — K2970 Gastritis, unspecified, without bleeding: Secondary | ICD-10-CM | POA: Diagnosis not present

## 2021-07-16 DIAGNOSIS — K2971 Gastritis, unspecified, with bleeding: Secondary | ICD-10-CM | POA: Diagnosis not present

## 2021-07-16 DIAGNOSIS — I2510 Atherosclerotic heart disease of native coronary artery without angina pectoris: Secondary | ICD-10-CM | POA: Diagnosis not present

## 2021-07-16 DIAGNOSIS — R634 Abnormal weight loss: Secondary | ICD-10-CM | POA: Diagnosis not present

## 2021-07-16 DIAGNOSIS — A048 Other specified bacterial intestinal infections: Secondary | ICD-10-CM | POA: Diagnosis not present

## 2021-07-16 DIAGNOSIS — I10 Essential (primary) hypertension: Secondary | ICD-10-CM | POA: Insufficient documentation

## 2021-07-16 DIAGNOSIS — K921 Melena: Secondary | ICD-10-CM | POA: Diagnosis not present

## 2021-07-16 DIAGNOSIS — K449 Diaphragmatic hernia without obstruction or gangrene: Secondary | ICD-10-CM

## 2021-07-16 DIAGNOSIS — K648 Other hemorrhoids: Secondary | ICD-10-CM | POA: Diagnosis not present

## 2021-07-16 DIAGNOSIS — K2950 Unspecified chronic gastritis without bleeding: Secondary | ICD-10-CM | POA: Insufficient documentation

## 2021-07-16 DIAGNOSIS — B9681 Helicobacter pylori [H. pylori] as the cause of diseases classified elsewhere: Secondary | ICD-10-CM | POA: Insufficient documentation

## 2021-07-16 DIAGNOSIS — K219 Gastro-esophageal reflux disease without esophagitis: Secondary | ICD-10-CM | POA: Insufficient documentation

## 2021-07-16 DIAGNOSIS — K625 Hemorrhage of anus and rectum: Secondary | ICD-10-CM

## 2021-07-16 HISTORY — PX: COLONOSCOPY WITH PROPOFOL: SHX5780

## 2021-07-16 HISTORY — PX: ESOPHAGOGASTRODUODENOSCOPY (EGD) WITH PROPOFOL: SHX5813

## 2021-07-16 HISTORY — PX: BIOPSY: SHX5522

## 2021-07-16 SURGERY — COLONOSCOPY WITH PROPOFOL
Anesthesia: General

## 2021-07-16 MED ORDER — PROPOFOL 10 MG/ML IV BOLUS
INTRAVENOUS | Status: DC | PRN
  Administered 2021-07-16: 60 mg via INTRAVENOUS

## 2021-07-16 MED ORDER — PROPOFOL 500 MG/50ML IV EMUL
INTRAVENOUS | Status: DC | PRN
  Administered 2021-07-16: 200 ug/kg/min via INTRAVENOUS

## 2021-07-16 MED ORDER — LIDOCAINE 2% (20 MG/ML) 5 ML SYRINGE
INTRAMUSCULAR | Status: DC | PRN
Start: 2021-07-16 — End: 2021-07-16
  Administered 2021-07-16: 50 mg via INTRAVENOUS

## 2021-07-16 MED ORDER — LACTATED RINGERS IV SOLN: INTRAVENOUS | Status: DC

## 2021-07-16 NOTE — H&P (Signed)
Primary Care Physician:  Chevis Pretty, FNP ?Primary Gastroenterologist:  Dr. Abbey Chatters ? ?Pre-Procedure History & Physical: ?HPI:  Teresa Holloway is a 81 y.o. female is here for an EGD and colonoscopy for rectal bleeding, melena, weight loss. . ? ?Past Medical History:  ?Diagnosis Date  ? Anxiety   ? Arthritis   ? Knees, hips  ? CAD (coronary artery disease)   ? Chronic back pain   ? Depression   ? GERD (gastroesophageal reflux disease)   ? Hyperlipidemia   ? Hypertension   ? MI (myocardial infarction) ([place])   ? Osteopenia   ? ? ?Past Surgical History:  ?Procedure Laterality Date  ? ECTOPIC PREGNANCY SURGERY    ? left breast biospy    ? left wrist fracture    ? TOTAL KNEE ARTHROPLASTY Left 01/25/2019  ? Procedure: LEFT TOTAL KNEE ARTHROPLASTY;  Surgeon: Frederik Pear, MD;  Location: [facility];  Service: Orthopedics;  Laterality: Left;  ? ? ?Prior to Admission medications   ?Medication Sig Start Date End Date Taking? Authorizing Provider  ?amLODipine (NORVASC) 5 MG tablet Take 1 tablet (5 mg total) by mouth daily. 03/08/21  Yes Hassell Done, Mary-Margaret, FNP  ?aspirin EC 81 MG tablet Take 1 tablet (81 mg total) by mouth 2 (two) times daily. 01/25/19  Yes Leighton Parody, PA-C  ?atorvastatin (LIPITOR) 40 MG tablet TAKE 1 TABLET BY MOUTH DAILY AT 6 PM. 03/08/21  Yes Chevis Pretty, FNP  ?benazepril (LOTENSIN) 40 MG tablet Take 1 tablet (40 mg total) by mouth daily. 03/08/21  Yes Martin, Mary-Margaret, FNP  ?brimonidine (ALPHAGAN) 0.2 % ophthalmic solution Place 1 drop into both eyes 2 (two) times daily. 04/20/21  Yes [provider]  ?cholecalciferol (VITAMIN D) 1000 UNITS tablet Take 1,000 Units by mouth daily.   Yes [provider]  ?clopidogrel (PLAVIX) 75 MG tablet Take 1 tablet (75 mg total) by mouth daily. 03/08/21  Yes Chevis Pretty, FNP  ?diclofenac sodium (VOLTAREN) 1 % GEL Apply 2 g topically 4 (four) times daily. ?Patient taking differently: Apply 2 g topically 4 (four) times  daily as needed (pain). 06/30/18  Yes Chevis Pretty, FNP  ?hydrochlorothiazide (HYDRODIURIL) 25 MG tablet Take 1 tablet (25 mg total) by mouth daily. 03/08/21  Yes Chevis Pretty, FNP  ?isosorbide mononitrate (IMDUR) 60 MG 24 hr tablet TAKE 1 TABLET BY MOUTH EVERYDAY AT BEDTIME 03/08/21  Yes Chevis Pretty, FNP  ?metoprolol succinate (TOPROL-XL) 50 MG 24 hr tablet Take 1 tablet (50 mg total) by mouth daily. Take with or immediately following a meal. 03/08/21  Yes Chevis Pretty, FNP  ?polyethylene glycol-electrolytes (TRILYTE) 420 g solution Take 4,000 mLs by mouth as directed. 06/26/21  Yes Eloise Harman, DO  ?traMADol (ULTRAM) 50 MG tablet Take 1 tablet (50 mg total) by mouth 2 (two) times daily. 06/07/21 06/07/22 Yes Chevis Pretty, FNP  ?escitalopram (LEXAPRO) 20 MG tablet Take 1 tablet (20 mg total) by mouth daily. 03/08/21   Hassell Done Mary-Margaret, FNP  ?nitroGLYCERIN (NITROSTAT) 0.4 MG SL tablet Place 1 tablet (0.4 mg total) under the tongue every 5 (five) minutes as needed for chest pain. 09/24/18   Chevis Pretty, FNP  ?polyethylene glycol-electrolytes (NULYTELY) 420 g solution As directed 05/16/21   Eloise Harman, DO  ? ? ?Allergies as of 06/26/2021 - Review Complete 06/07/2021  ?Allergen Reaction Noted  ? Atarax [hydroxyzine] Nausea And Vomiting 08/10/2012  ? Bactrim [sulfamethoxazole-trimethoprim] Nausea Only 03/23/2015  ? Coreg [carvedilol] Other (See Comments) 08/10/2012  ? ? ?Family History  ?  Problem Relation Age of Onset  ? Asthma Father   ? Diabetes Sister   ? Diabetes Brother   ? ? ?Social History  ? ?Socioeconomic History  ? Marital status: Widowed  ?  Spouse name: Octavia Bruckner  ? Number of children: 1  ? Years of education: Not on file  ? Highest education level: 10th grade  ?Occupational History  ? Occupation: Retired  ?Tobacco Use  ? Smoking status: Never  ? Smokeless tobacco: Never  ?Vaping Use  ? Vaping Use: Never used  ?Substance and Sexual Activity  ?  Alcohol use: No  ? Drug use: No  ? Sexual activity: Not Currently  ?  Birth control/protection: Post-menopausal  ?Other Topics Concern  ? Not on file  ?Social History Narrative  ? Husband passed 12/2020.   ? 1 son-[name].  ? 2 grandchildren  ? Twin great grandchildren.  ? ?Social Determinants of Health  ? ?Financial Resource Strain: Low Risk   ? Difficulty of Paying Living Expenses: Not hard at all  ?Food Insecurity: No Food Insecurity  ? Worried About Charity fundraiser in the Last Year: Never true  ? Ran Out of Food in the Last Year: Never true  ?Transportation Needs: No Transportation Needs  ? Lack of Transportation (Medical): No  ? Lack of Transportation (Non-Medical): No  ?Physical Activity: Insufficiently Active  ? Days of Exercise per Week: 5 days  ? Minutes of Exercise per Session: 20 min  ?Stress: No Stress Concern Present  ? Feeling of Stress : Only a little  ?Social Connections: Moderately Isolated  ? Frequency of Communication with Friends and Family: More than three times a week  ? Frequency of Social Gatherings with Friends and Family: More than three times a week  ? Attends Religious Services: 1 to 4 times per year  ? Active Member of Clubs or Organizations: No  ? Attends Archivist Meetings: Never  ? Marital Status: Widowed  ?Intimate Partner Violence: Not At Risk  ? Fear of Current or Ex-Partner: No  ? Emotionally Abused: No  ? Physically Abused: No  ? Sexually Abused: No  ? ? ?Review of Systems: ?See HPI, otherwise negative ROS ? ?Physical Exam: ?Vital signs in last 24 hours: ?Temp:  [98.2 ?F (36.8 ?C)] 98.2 ?F (36.8 ?C) (03/27 8182) ?Pulse Rate:  [60] 60 (03/27 0953) ?Resp:  [15] 15 (03/27 0953) ?BP: (163)/(63) 163/63 (03/27 9937) ?SpO2:  [99 %] 99 % (03/27 0953) ?  ?General:   Alert,  Well-developed, well-nourished, pleasant and cooperative in NAD ?Head:  Normocephalic and atraumatic. ?Eyes:  Sclera clear, no icterus.   Conjunctiva pink. ?Ears:  Normal auditory acuity. ?Nose:  No  deformity, discharge,  or lesions. ?Mouth:  No deformity or lesions, dentition normal. ?Neck:  Supple; no masses or thyromegaly. ?Lungs:  Clear throughout to auscultation.   No wheezes, crackles, or rhonchi. No acute distress. ?Heart:  Regular rate and rhythm; no murmurs, clicks, rubs,  or gallops. ?Abdomen:  Soft, nontender and nondistended. No masses, hepatosplenomegaly or hernias noted. Normal bowel sounds, without guarding, and without rebound.   ?Msk:  Symmetrical without gross deformities. Normal posture. ?Extremities:  Without clubbing or edema. ?Neurologic:  Alert and  oriented x4;  grossly normal neurologically. ?Skin:  Intact without significant lesions or rashes. ?Cervical Nodes:  No significant cervical adenopathy. ?Psych:  Alert and cooperative. Normal mood and affect. ? ?Impression/Plan: ?Teresa Holloway is here for an EGD and colonoscopy for rectal bleeding, melena, weight loss.  ? ?  The risks of the procedure including infection, bleed, or perforation as well as benefits, limitations, alternatives and imponderables have been reviewed with the patient. Questions have been answered. All parties agreeable. ? ? ?

## 2021-07-16 NOTE — Transfer of Care (Signed)
Immediate Anesthesia Transfer of Care Note ? ?Patient: Teresa Holloway ? ?Procedure(s) Performed: COLONOSCOPY WITH PROPOFOL ?ESOPHAGOGASTRODUODENOSCOPY (EGD) WITH PROPOFOL ?BIOPSY ? ?Patient Location: Endoscopy Unit ? ?Anesthesia Type:MAC ? ?Level of Consciousness: awake, alert , oriented and patient cooperative ? ?Airway & Oxygen Therapy: Patient Spontanous Breathing and Patient connected to nasal cannula oxygen ? ?Post-op Assessment: Report given to RN, Post -op Vital signs reviewed and stable and Patient moving all extremities ? ?Post vital signs: Reviewed and stable ? ?Last Vitals:  ?Vitals Value Taken Time  ?BP    ?Temp    ?Pulse    ?Resp    ?SpO2    ? ? ?Last Pain:  ?Vitals:  ? 07/16/21 1032  ?TempSrc:   ?PainSc: 0-No pain  ?   ? ?Patients Stated Pain Goal: 3 (07/16/21 2563) ? ?Complications: No notable events documented. ?

## 2021-07-16 NOTE — Discharge Instructions (Addendum)
EGD ?Discharge instructions ?Please read the instructions outlined below and refer to this sheet in the next few weeks. These discharge instructions provide you with general information on caring for yourself after you leave the hospital. Your doctor may also give you specific instructions. While your treatment has been planned according to the most current medical practices available, unavoidable complications occasionally occur. If you have any problems or questions after discharge, please call your doctor. ?ACTIVITY ?You may resume your regular activity but move at a slower pace for the next 24 hours.  ?Take frequent rest periods for the next 24 hours.  ?Walking will help expel (get rid of) the air and reduce the bloated feeling in your abdomen.  ?No driving for 24 hours (because of the anesthesia (medicine) used during the test).  ?You may shower.  ?Do not sign any important legal documents or operate any machinery for 24 hours (because of the anesthesia used during the test).  ?NUTRITION ?Drink plenty of fluids.  ?You may resume your normal diet.  ?Begin with a light meal and progress to your normal diet.  ?Avoid alcoholic beverages for 24 hours or as instructed by your caregiver.  ?MEDICATIONS ?You may resume your normal medications unless your caregiver tells you otherwise.  ?WHAT YOU CAN EXPECT TODAY ?You may experience abdominal discomfort such as a feeling of fullness or ?gas? pains.  ?FOLLOW-UP ?Your doctor will discuss the results of your test with you.  ?SEEK IMMEDIATE MEDICAL ATTENTION IF ANY OF THE FOLLOWING OCCUR: ?Excessive nausea (feeling sick to your stomach) and/or vomiting.  ?Severe abdominal pain and distention (swelling).  ?Trouble swallowing.  ?Temperature over 101? F (37.8? C).  ?Rectal bleeding or vomiting of blood.  ? ? ?Colonoscopy ?Discharge Instructions ? ?Read the instructions outlined below and refer to this sheet in the next few weeks. These discharge instructions provide you with  general information on caring for yourself after you leave the hospital. Your doctor may also give you specific instructions. While your treatment has been planned according to the most current medical practices available, unavoidable complications occasionally occur.  ? ?ACTIVITY ?You may resume your regular activity, but move at a slower pace for the next 24 hours.  ?Take frequent rest periods for the next 24 hours.  ?Walking will help get rid of the air and reduce the bloated feeling in your belly (abdomen).  ?No driving for 24 hours (because of the medicine (anesthesia) used during the test).   ?Do not sign any important legal documents or operate any machinery for 24 hours (because of the anesthesia used during the test).  ?NUTRITION ?Drink plenty of fluids.  ?You may resume your normal diet as instructed by your doctor.  ?Begin with a light meal and progress to your normal diet. Heavy or fried foods are harder to digest and may make you feel sick to your stomach (nauseated).  ?Avoid alcoholic beverages for 24 hours or as instructed.  ?MEDICATIONS ?You may resume your normal medications unless your doctor tells you otherwise.  ?WHAT YOU CAN EXPECT TODAY ?Some feelings of bloating in the abdomen.  ?Passage of more gas than usual.  ?Spotting of blood in your stool or on the toilet paper.  ?IF YOU HAD POLYPS REMOVED DURING THE COLONOSCOPY: ?No aspirin products for 7 days or as instructed.  ?No alcohol for 7 days or as instructed.  ?Eat a soft diet for the next 24 hours.  ?FINDING OUT THE RESULTS OF YOUR TEST ?Not all test results are available  during your visit. If your test results are not back during the visit, make an appointment with your caregiver to find out the results. Do not assume everything is normal if you have not heard from your caregiver or the medical facility. It is important for you to follow up on all of your test results.  ?SEEK IMMEDIATE MEDICAL ATTENTION IF: ?You have more than a spotting of  blood in your stool.  ?Your belly is swollen (abdominal distention).  ?You are nauseated or vomiting.  ?You have a temperature over 101.  ?You have abdominal pain or discomfort that is severe or gets worse throughout the day.  ? ?Your EGD revealed mild amount inflammation in your stomach.  I took biopsies of this to rule out infection with a bacteria called H. pylori.  Await pathology results, my office will contact you.  ? ?Your colonoscopy was relatively unremarkable.  I did not find any polyps or evidence of colon cancer.  Overall, today's exam was benign. ? ?Follow up with GI in 3-4 months.  ? ?I hope you have a great rest of your week! ? ?Elon Alas. Abbey Chatters, D.O. ?Gastroenterology and Hepatology ?[facility] ? ?

## 2021-07-16 NOTE — Op Note (Signed)
[facility] ?Patient Name: Teresa Holloway ?Procedure Date: 07/16/2021 10:27 AM ?MRN: [id number] ?Date of Birth: [birth year] ?Attending MD: [name]. [name] , DO ?CSN: [id number] ?Age: 81 ?Admit Type: Outpatient ?Procedure:                Upper GI endoscopy ?Indications:              Melena, Weight loss ?Providers:                [name]. [name], DO, Crystal Page, Judeth Cornfield.  ?                          Jessee Avers, Technician ?Referring MD:              ?Medicines:                See the Anesthesia note for documentation of the  ?                          administered medications ?Complications:            No immediate complications. ?Estimated Blood Loss:     Estimated blood loss was minimal. ?Procedure:                Pre-Anesthesia Assessment: ?                          - The anesthesia plan was to use monitored  ?                          anesthesia care (MAC). ?                          After obtaining informed consent, the endoscope was  ?                          passed under direct vision. Throughout the  ?                          procedure, the patient's blood pressure, pulse, and  ?                          oxygen saturations were monitored continuously. The  ?                          [id number]-H190 ([id number]) scope was introduced through the  ?                          mouth, and advanced to the second part of duodenum.  ?                          The upper GI endoscopy was accomplished without  ?                          difficulty. The patient tolerated the procedure  ?                          well. ?Scope In: 10:36:14  AM ?Scope Out: 10:39:52 AM ?Total Procedure Duration: 0 hours 3 minutes 38 seconds  ?Findings: ?     The Z-line was regular and was found 39 cm from the incisors. ?     Patchy mild inflammation characterized by erythema was found in the  ?     gastric body and in the gastric antrum. Biopsies were taken with a cold  ?     forceps for Helicobacter pylori testing. ?     The duodenal bulb,  first portion of the duodenum and second portion of  ?     the duodenum were normal. ?     A small hiatal hernia was present. ?Impression:               - Z-line regular, 39 cm from the incisors. ?                          - Gastritis. Biopsied. ?                          - Normal duodenal bulb, first portion of the  ?                          duodenum and second portion of the duodenum. ?                          - Small hiatal hernia. ?Moderate Sedation: ?     Per Anesthesia Care ?Recommendation:           - Patient has a contact number available for  ?                          emergencies. The signs and symptoms of potential  ?                          delayed complications were discussed with the  ?                          patient. Return to normal activities tomorrow.  ?                          Written discharge instructions were provided to the  ?                          patient. ?                          - Resume previous diet. ?                          - Continue present medications. ?                          - Await pathology results. ?                          - Proceed with colonoscopy ?Procedure Code(s):        --- Professional --- ?  19147, Esophagogastroduodenoscopy, flexible,  ?                          transoral; with biopsy, single or multiple ?Diagnosis Code(s):        --- Professional --- ?                          K29.70, Gastritis, unspecified, without bleeding ?                          K92.1, Melena (includes Hematochezia) ?                          R63.4, Abnormal weight loss ?CPT copyright 2019 American Medical Association. All rights reserved. ?The codes documented in this report are preliminary and upon coder review may  ?be revised to meet current compliance requirements. ?[name]. [name], DO ?[name]. Pearlie Lafosse, DO ?07/16/2021 10:42:32 AM ?This report has been signed electronically. ?Number of Addenda: 0 ?

## 2021-07-16 NOTE — Op Note (Signed)
[facility] ?Patient Name: Teresa Holloway ?Procedure Date: 07/16/2021 10:41 AM ?MRN: [id number] ?Date of Birth: 06-Aug-1940 ?Attending MD: [name]. [name] , DO ?CSN: [id number] ?Age: 81 ?Admit Type: Outpatient ?Procedure:                Colonoscopy ?Indications:              Melena, Rectal bleeding, Weight loss ?Providers:                [name]. [name], DO, Crystal Page, Judeth Cornfield.  ?                          Jessee Avers, Technician ?Referring MD:              ?Medicines:                See the Anesthesia note for documentation of the  ?                          administered medications ?Complications:            No immediate complications. ?Estimated Blood Loss:     Estimated blood loss: none. ?Procedure:                Pre-Anesthesia Assessment: ?                          - The anesthesia plan was to use monitored  ?                          anesthesia care (MAC). ?                          After obtaining informed consent, the colonoscope  ?                          was passed under direct vision. Throughout the  ?                          procedure, the patient's blood pressure, pulse, and  ?                          oxygen saturations were monitored continuously. The  ?                          PCF-HQ190L ([id number]) scope was introduced through  ?                          the anus and advanced to the the cecum, identified  ?                          by appendiceal orifice and ileocecal valve. The  ?                          colonoscopy was performed without difficulty. The  ?                          patient tolerated the procedure well. The  quality  ?                          of the bowel preparation was evaluated using the  ?                          BBPS [facility] Bowel Preparation Scale) with scores  ?                          of: Right Colon = 2 (minor amount of residual  ?                          staining, small fragments of stool and/or opaque  ?                          liquid, but mucosa seen  well), Transverse Colon = 3  ?                          (entire mucosa seen well with no residual staining,  ?                          small fragments of stool or opaque liquid) and Left  ?                          Colon = 3 (entire mucosa seen well with no residual  ?                          staining, small fragments of stool or opaque  ?                          liquid). The total BBPS score equals 8. The quality  ?                          of the bowel preparation was good. ?Scope In: 10:43:44 AM ?Scope Out: 10:56:38 AM ?Scope Withdrawal Time: 0 hours 7 minutes 1 second  ?Total Procedure Duration: 0 hours 12 minutes 54 seconds  ?Findings: ?     The perianal and digital rectal examinations were normal. ?     Non-bleeding internal hemorrhoids were found during endoscopy. ?     The exam was otherwise without abnormality. ?Impression:               - Non-bleeding internal hemorrhoids. ?                          - The examination was otherwise normal. ?                          - No specimens collected. ?Moderate Sedation: ?     Per Anesthesia Care ?Recommendation:           - Patient has a contact number available for  ?                          emergencies. The signs and symptoms of potential  ?  delayed complications were discussed with the  ?                          patient. Return to normal activities tomorrow.  ?                          Written discharge instructions were provided to the  ?                          patient. ?                          - Resume previous diet. ?                          - Continue present medications. ?                          - Await pathology results. ?                          - No repeat colonoscopy due to age. ?                          - Return to GI clinic in 4 months. ?Procedure Code(s):        --- Professional --- ?                          [phone], Colonoscopy, flexible; diagnostic, including  ?                          collection of specimen(s)  by brushing or washing,  ?                          when performed (separate procedure) ?Diagnosis Code(s):        --- Professional --- ?                          X91.4, Other hemorrhoids ?                          K92.1, Melena (includes Hematochezia) ?                          K62.5, Hemorrhage of anus and rectum ?                          R63.4, Abnormal weight loss ?CPT copyright 2019 American Medical Association. All rights reserved. ?The codes documented in this report are preliminary and upon coder review may  ?be revised to meet current compliance requirements. ?[name]. [name], DO ?[name]. Jaymar Loeber, DO ?07/16/2021 10:59:53 AM ?This report has been signed electronically. ?Number of Addenda: 0 ?

## 2021-07-16 NOTE — Anesthesia Preprocedure Evaluation (Signed)
Anesthesia Evaluation  ?Patient identified by MRN, date of birth, ID band ?Patient awake ? ? ? ?Reviewed: ?Allergy & Precautions, H&P , NPO status , Patient's Chart, lab work & pertinent test results, reviewed documented beta blocker date and time  ? ?Airway ?Mallampati: II ? ?TM Distance: >3 FB ?Neck ROM: full ? ? ? Dental ?no notable dental hx. ? ?  ?Pulmonary ?neg pulmonary ROS,  ?  ?Pulmonary exam normal ?breath sounds clear to auscultation ? ? ? ? ? ? Cardiovascular ?Exercise Tolerance: Good ?hypertension, + CAD and + Past MI  ? ?Rhythm:regular Rate:Normal ? ? ?  ?Neuro/Psych ?PSYCHIATRIC DISORDERS Anxiety Depression negative neurological ROS ?   ? GI/Hepatic ?Neg liver ROS, GERD  Medicated,  ?Endo/Other  ?negative endocrine ROS ? Renal/GU ?negative Renal ROS  ?negative genitourinary ?  ?Musculoskeletal ? ? Abdominal ?  ?Peds ? Hematology ?negative hematology ROS ?(+)   ?Anesthesia Other Findings ? ? Reproductive/Obstetrics ?negative OB ROS ? ?  ? ? ? ? ? ? ? ? ? ? ? ? ? ?  ?  ? ? ? ? ? ? ? ? ?Anesthesia Physical ?Anesthesia Plan ? ?ASA: 2 ? ?Anesthesia Plan: General  ? ?Post-op Pain Management:   ? ?Induction:  ? ?PONV Risk Score and Plan: Propofol infusion ? ?Airway Management Planned:  ? ?Additional Equipment:  ? ?Intra-op Plan:  ? ?Post-operative Plan:  ? ?Informed Consent: I have reviewed the patients History and Physical, chart, labs and discussed the procedure including the risks, benefits and alternatives for the proposed anesthesia with the patient or authorized representative who has indicated his/her understanding and acceptance.  ? ? ? ?Dental Advisory Given ? ?Plan Discussed with: CRNA ? ?Anesthesia Plan Comments:   ? ? ? ? ? ? ?Anesthesia Quick Evaluation ? ?

## 2021-07-17 NOTE — Anesthesia Postprocedure Evaluation (Signed)
Anesthesia Post Note ? ?Patient: VICKE PLOTNER ? ?Procedure(s) Performed: COLONOSCOPY WITH PROPOFOL ?ESOPHAGOGASTRODUODENOSCOPY (EGD) WITH PROPOFOL ?BIOPSY ? ?Patient location during evaluation: Phase II ?Anesthesia Type: General ?Level of consciousness: awake ?Pain management: pain level controlled ?Vital Signs Assessment: post-procedure vital signs reviewed and stable ?Respiratory status: spontaneous breathing and respiratory function stable ?Cardiovascular status: blood pressure returned to baseline and stable ?Postop Assessment: no headache and no apparent nausea or vomiting ?Anesthetic complications: no ?Comments: Late entry ? ? ?No notable events documented. ? ? ?Last Vitals:  ?Vitals:  ? 07/16/21 0953 07/16/21 1100  ?BP: (!) 163/63 (!) 96/35  ?Pulse: 60 61  ?Resp: 15 19  ?Temp: 36.8 ?C (!) 36.3 ?C  ?SpO2: 99% 98%  ?  ?Last Pain:  ?Vitals:  ? 07/16/21 1100  ?TempSrc: Axillary  ?PainSc: 0-No pain  ? ? ?  ?  ?  ?  ?  ?  ? ?Louann Sjogren ? ? ? ? ?

## 2021-07-18 ENCOUNTER — Other Ambulatory Visit: Payer: Self-pay

## 2021-07-18 ENCOUNTER — Telehealth: Payer: Self-pay | Admitting: Internal Medicine

## 2021-07-18 ENCOUNTER — Other Ambulatory Visit: Payer: Self-pay | Admitting: Internal Medicine

## 2021-07-18 DIAGNOSIS — B9681 Helicobacter pylori [H. pylori] as the cause of diseases classified elsewhere: Secondary | ICD-10-CM

## 2021-07-18 DIAGNOSIS — K219 Gastro-esophageal reflux disease without esophagitis: Secondary | ICD-10-CM

## 2021-07-18 LAB — SURGICAL PATHOLOGY

## 2021-07-18 MED ORDER — PYLERA 140-125-125 MG PO CAPS: 3.0000 | ORAL_CAPSULE | Freq: Three times a day (TID) | ORAL | 0 refills | Status: DC

## 2021-07-18 NOTE — Telephone Encounter (Signed)
Patient has H. pylori gastritis.  Please let her know.  Please send in Prevpac or generic equivalent to pharmacy.  Patient will need to hold current PPI until after full 14-day course at which time they may resume PPI therapy.  Follow-up as previously scheduled to discuss eradication testing.  Thank you ?

## 2021-07-18 NOTE — Telephone Encounter (Signed)
Last ov: 05/16/21

## 2021-07-18 NOTE — Telephone Encounter (Signed)
noted 

## 2021-07-18 NOTE — Telephone Encounter (Signed)
Sent in Rx for Pylera for pt to her pharmacy. LMOVM for pt to return call ?

## 2021-07-19 ENCOUNTER — Encounter (HOSPITAL_COMMUNITY): Payer: Self-pay | Admitting: Internal Medicine

## 2021-07-19 NOTE — Telephone Encounter (Signed)
Phoned and spoke to the pt and advised her of her result note and Rx to pick up at pharmacy and to take for the full 14 days. Pt expressed understanding ?

## 2021-07-23 ENCOUNTER — Telehealth: Payer: Self-pay | Admitting: Internal Medicine

## 2021-07-23 MED ORDER — PANTOPRAZOLE SODIUM 40 MG PO TBEC: 40.0000 mg | DELAYED_RELEASE_TABLET | Freq: Two times a day (BID) | ORAL | 5 refills | Status: DC

## 2021-07-23 MED ORDER — AMOXICILLIN 500 MG PO TABS: 1000.0000 mg | ORAL_TABLET | Freq: Two times a day (BID) | ORAL | 0 refills | Status: AC

## 2021-07-23 MED ORDER — CLARITHROMYCIN 500 MG PO TABS: 500.0000 mg | ORAL_TABLET | Freq: Two times a day (BID) | ORAL | 0 refills | Status: AC

## 2021-07-23 NOTE — Telephone Encounter (Signed)
I have sent in clarithromycin and amoxicillin x14 days to patient's pharmacy.  I will also send in pantoprazole 40 mg twice daily.  She will need to stay on pantoprazole twice daily until she sees [place] in office to discuss eradication testing.  Thank you ?

## 2021-07-23 NOTE — Telephone Encounter (Signed)
Pt said she had a colonoscopy last week and she sent someone to the pharmacy to pick up a prescription Dr Abbey Chatters prescribed for her and it was too expensive. She doesn't know the name of medicine and was asking for something else cheaper. She uses [facility] in [place]. [phone] ?

## 2021-07-23 NOTE — Telephone Encounter (Signed)
The pt is needing a cheaper medication phoned in because she cannot afford the Pylera which was sent in for her. Please advise ?

## 2021-07-23 NOTE — Telephone Encounter (Signed)
Pt's daughter in law Kenzli Barritt phoned to advise that the pt cannot afford to get the Pylera and could Dr. Abbey Chatters send in another medication. Dr. Abbey Chatters was in the office so I phoned the pt back and advised her that he was sending in 3 generic antibiotics and to take for the full 14 days. The pt's daughter in law expressed understanding and thanks [place] ?

## 2021-07-24 NOTE — Telephone Encounter (Signed)
Phoned and spoke with the pt advised of the medications that were phoned in and how to take them. Also advised of the pantoprazole and how to take it and to continue until her followup here. If her daughter needs to call us then  can also advise her as well. The pt expressed understanding ?

## 2021-08-24 ENCOUNTER — Other Ambulatory Visit: Payer: Self-pay | Admitting: Nurse Practitioner

## 2021-08-24 DIAGNOSIS — I10 Essential (primary) hypertension: Secondary | ICD-10-CM

## 2021-08-24 DIAGNOSIS — H25813 Combined forms of age-related cataract, bilateral: Secondary | ICD-10-CM | POA: Diagnosis not present

## 2021-08-24 DIAGNOSIS — H401412 Capsular glaucoma with pseudoexfoliation of lens, right eye, moderate stage: Secondary | ICD-10-CM | POA: Diagnosis not present

## 2021-08-24 DIAGNOSIS — H401421 Capsular glaucoma with pseudoexfoliation of lens, left eye, mild stage: Secondary | ICD-10-CM | POA: Diagnosis not present

## 2021-08-28 ENCOUNTER — Ambulatory Visit: Payer: Self-pay | Admitting: *Deleted

## 2021-08-28 NOTE — Chronic Care Management (AMB) (Signed)
? ?  08/28/2021 ? ?Teresa Holloway ?1941-03-29 ?[id number] ? ?Case closed to Chronic Care Management services in primary care home due to lack of patient engagement within the past 6 months. Re-enrollment in CCM services can be initiated in the future by PCP or patient as needed. ? ?Follow Up Plan: No further follow-up required at this time. Patient or PCP may request re-enrollment as necessary. Patient enrollment status has been updated to previously enrolled and RN Care Manager has been removed from the care plan.  ? ?Demetrios Loll, BSN, RN-BC ?Embedded Chronic Care Manager ?[facility] ?Direct Dial: [phone] ? ?

## 2021-09-04 ENCOUNTER — Encounter: Payer: Self-pay | Admitting: Nurse Practitioner

## 2021-09-04 ENCOUNTER — Ambulatory Visit: Payer: Medicare Other

## 2021-09-04 ENCOUNTER — Ambulatory Visit (INDEPENDENT_AMBULATORY_CARE_PROVIDER_SITE_OTHER): Payer: Medicare Other | Admitting: Nurse Practitioner

## 2021-09-04 VITALS — BP 116/59 | HR 52 | Temp 97.6°F | Resp 20 | Ht 64.0 in | Wt 141.0 lb

## 2021-09-04 DIAGNOSIS — M810 Age-related osteoporosis without current pathological fracture: Secondary | ICD-10-CM

## 2021-09-04 DIAGNOSIS — E785 Hyperlipidemia, unspecified: Secondary | ICD-10-CM | POA: Diagnosis not present

## 2021-09-04 DIAGNOSIS — I10 Essential (primary) hypertension: Secondary | ICD-10-CM

## 2021-09-04 DIAGNOSIS — K219 Gastro-esophageal reflux disease without esophagitis: Secondary | ICD-10-CM

## 2021-09-04 DIAGNOSIS — I25111 Atherosclerotic heart disease of native coronary artery with angina pectoris with documented spasm: Secondary | ICD-10-CM

## 2021-09-04 DIAGNOSIS — M1611 Unilateral primary osteoarthritis, right hip: Secondary | ICD-10-CM | POA: Diagnosis not present

## 2021-09-04 DIAGNOSIS — Z79891 Long term (current) use of opiate analgesic: Secondary | ICD-10-CM | POA: Diagnosis not present

## 2021-09-04 DIAGNOSIS — E876 Hypokalemia: Secondary | ICD-10-CM

## 2021-09-04 DIAGNOSIS — F3342 Major depressive disorder, recurrent, in full remission: Secondary | ICD-10-CM

## 2021-09-04 DIAGNOSIS — Z6830 Body mass index (BMI) 30.0-30.9, adult: Secondary | ICD-10-CM

## 2021-09-04 MED ORDER — HYDROCHLOROTHIAZIDE 25 MG PO TABS: 25.0000 mg | ORAL_TABLET | Freq: Every day | ORAL | 1 refills | Status: DC

## 2021-09-04 MED ORDER — TRAMADOL HCL 50 MG PO TABS: 50.0000 mg | ORAL_TABLET | Freq: Two times a day (BID) | ORAL | 2 refills | Status: DC

## 2021-09-04 MED ORDER — BENAZEPRIL HCL 40 MG PO TABS: 40.0000 mg | ORAL_TABLET | Freq: Every day | ORAL | 1 refills | Status: DC

## 2021-09-04 MED ORDER — ATORVASTATIN CALCIUM 40 MG PO TABS: ORAL_TABLET | ORAL | 1 refills | Status: DC

## 2021-09-04 MED ORDER — CLOPIDOGREL BISULFATE 75 MG PO TABS: 75.0000 mg | ORAL_TABLET | Freq: Every day | ORAL | 1 refills | Status: DC

## 2021-09-04 MED ORDER — AMLODIPINE BESYLATE 5 MG PO TABS: 5.0000 mg | ORAL_TABLET | Freq: Every day | ORAL | 0 refills | Status: DC

## 2021-09-04 MED ORDER — METOPROLOL SUCCINATE ER 50 MG PO TB24: 50.0000 mg | ORAL_TABLET | Freq: Every day | ORAL | 1 refills | Status: DC

## 2021-09-04 MED ORDER — ISOSORBIDE MONONITRATE ER 60 MG PO TB24: ORAL_TABLET | ORAL | 1 refills | Status: DC

## 2021-09-04 MED ORDER — ESCITALOPRAM OXALATE 20 MG PO TABS: 20.0000 mg | ORAL_TABLET | Freq: Every day | ORAL | 1 refills | Status: DC

## 2021-09-04 MED ORDER — PANTOPRAZOLE SODIUM 40 MG PO TBEC: 40.0000 mg | DELAYED_RELEASE_TABLET | Freq: Two times a day (BID) | ORAL | 5 refills | Status: DC

## 2021-09-04 NOTE — Patient Instructions (Signed)

## 2021-09-04 NOTE — Progress Notes (Signed)
? ?Subjective:  ? ? Patient ID: Teresa Holloway, female    DOB: 1940/10/21, 81 y.o.   MRN: [id number] ? ? ?Chief Complaint: No chief complaint on file. ?  ? ?HPI: ? ?[name] is a 81 y.o. who identifies as a female who was assigned female at birth.  ? ?Social history: ?Lives with: by herself- son stays with her son ?Work history: retired ? ? ?Comes in today for follow up of the following chronic medical issues: ? ?1. Essential hypertension, benign ?No c/o chest pain, sob or headache. Does not check her blood pressure at home very often but os below 140 systolic when she does check it. ? ?2. Hyperlipidemia with target LDL less than 100 ?Does not watch diet. Walks her dog daily ?Lab Results  ?Component Value Date  ? CHOL 131 03/08/2021  ? HDL 69 03/08/2021  ? LDLCALC 50 03/08/2021  ? TRIG 55 03/08/2021  ? CHOLHDL 1.9 03/08/2021  ? ? ? ?3. Coronary artery disease involving native coronary artery of native heart with angina pectoris with documented spasm (HCC) ?Has not seen cardiology lately. Has an appointment coming up in the next month or so. ? ?4. Gastroesophageal reflux disease without esophagitis ?No symptoms.takes OTC meds when needed. ? ?5. Recurrent major depressive disorder, in full remission (HCC) ?Is on lexapro. Her husband died this year and  that has been hard for her. ? ?  07/04/2021  ?  2:04 PM 06/07/2021  ? 11:08 AM 03/08/2021  ? 10:14 AM  ?Depression screen PHQ 2/9  ?Decreased Interest 0 1 2  ?Down, Depressed, Hopeless 1 1 0  ?PHQ - 2 Score 1 2 2   ?Altered sleeping 0 0 0  ?Tired, decreased energy 0 0 0  ?Change in appetite 0 0 0  ?Feeling bad or failure about yourself  0 1 0  ?Trouble concentrating   2  ?Moving slowly or fidgety/restless 0 0 0  ?Suicidal thoughts 0 0 0  ?PHQ-9 Score 1 3 4   ?Difficult doing work/chores  Not difficult at all Somewhat difficult  ? ? ? ?6. Age related osteoporosis, unspecified pathological fracture presence ?Needs to have done . Is scheduled to have today but does  not have time today. ? ?7. Primary osteoarthritis of right hip ?Chronic right hip pain ?Pain assessment: ?Cause of pain- bursitits and arthritis ?Pain location- right hip ?Pain on scale of 1-10- 7/10 ?Frequency- daily ?What increases pain-walking and standing ?What makes pain Better-rest ?Effects on ADL - none ?Any change in general medical condition-none ? ?Current opioids rx- ultram ?# meds rx- 60 ?Effectiveness of current meds-helps ?Adverse reactions from pain meds-none ?Morphine equivalent- ? ?Pill count performed-No ?Last drug screen - 03/07/20 ?( high risk q35m, moderate risk q39m, low risk yearly ) ?Urine drug screen today- Yes ?Was the NCCSR reviewed- yes ? If yes were their any concerning findings? - no ? ? ?Overdose risk: 1 ? ?Pain contract signed on:09/04/21 ? ? ?8. BMI 30.0-30.9,adult ?No recent weight changes ?Wt Readings from Last 3 Encounters:  ?09/04/21 141 lb (64 kg)  ?07/11/21 142 lb (64.4 kg)  ?07/04/21 142 lb (64.4 kg)  ? ?BMI Readings from Last 3 Encounters:  ?09/04/21 24.20 kg/m?  ?07/11/21 24.37 kg/m?  ?07/04/21 24.37 kg/m?  ? ? ? ? ?New complaints: ?None today ? ?Allergies  ?Allergen Reactions  ? Atarax [Hydroxyzine] Nausea And Vomiting  ? Bactrim [Sulfamethoxazole-Trimethoprim] Nausea Only  ? Coreg [Carvedilol] Other (See Comments)  ?  Unknown  ? ?Outpatient Encounter  Medications as of 09/04/2021  ?Medication Sig  ? amLODipine (NORVASC) 5 MG tablet TAKE 1 TABLET (5 MG TOTAL) BY MOUTH DAILY.  ? aspirin EC 81 MG tablet Take 1 tablet (81 mg total) by mouth 2 (two) times daily.  ? atorvastatin (LIPITOR) 40 MG tablet TAKE 1 TABLET BY MOUTH DAILY AT 6 PM.  ? benazepril (LOTENSIN) 40 MG tablet Take 1 tablet (40 mg total) by mouth daily.  ? brimonidine (ALPHAGAN) 0.2 % ophthalmic solution Place 1 drop into both eyes 2 (two) times daily.  ? cholecalciferol (VITAMIN D) 1000 UNITS tablet Take 1,000 Units by mouth daily.  ? clopidogrel (PLAVIX) 75 MG tablet Take 1 tablet (75 mg total) by mouth daily.   ? diclofenac sodium (VOLTAREN) 1 % GEL Apply 2 g topically 4 (four) times daily. (Patient taking differently: Apply 2 g topically 4 (four) times daily as needed (pain).)  ? escitalopram (LEXAPRO) 20 MG tablet Take 1 tablet (20 mg total) by mouth daily.  ? hydrochlorothiazide (HYDRODIURIL) 25 MG tablet Take 1 tablet (25 mg total) by mouth daily.  ? isosorbide mononitrate (IMDUR) 60 MG 24 hr tablet TAKE 1 TABLET BY MOUTH EVERYDAY AT BEDTIME  ? metoprolol succinate (TOPROL-XL) 50 MG 24 hr tablet Take 1 tablet (50 mg total) by mouth daily. Take with or immediately following a meal.  ? nitroGLYCERIN (NITROSTAT) 0.4 MG SL tablet Place 1 tablet (0.4 mg total) under the tongue every 5 (five) minutes as needed for chest pain.  ? pantoprazole (PROTONIX) 40 MG tablet Take 1 tablet (40 mg total) by mouth 2 (two) times daily.  ? traMADol (ULTRAM) 50 MG tablet Take 1 tablet (50 mg total) by mouth 2 (two) times daily.  ? ?No facility-administered encounter medications on file as of 09/04/2021.  ? ? ?Past Surgical History:  ?Procedure Laterality Date  ? BIOPSY  07/16/2021  ? Procedure: BIOPSY;  Surgeon: Lanelle Bal, DO;  Location: [facility];  Service: Endoscopy;;  ? COLONOSCOPY WITH PROPOFOL N/A 07/16/2021  ? Procedure: COLONOSCOPY WITH PROPOFOL;  Surgeon: Lanelle Bal, DO;  Location: [facility];  Service: Endoscopy;  Laterality: N/A;  10:30am  ? ECTOPIC PREGNANCY SURGERY    ? ESOPHAGOGASTRODUODENOSCOPY (EGD) WITH PROPOFOL N/A 07/16/2021  ? Procedure: ESOPHAGOGASTRODUODENOSCOPY (EGD) WITH PROPOFOL;  Surgeon: Lanelle Bal, DO;  Location: [facility];  Service: Endoscopy;  Laterality: N/A;  ? left breast biospy    ? left wrist fracture    ? TOTAL KNEE ARTHROPLASTY Left 01/25/2019  ? Procedure: LEFT TOTAL KNEE ARTHROPLASTY;  Surgeon: Gean Birchwood, MD;  Location: [facility];  Service: Orthopedics;  Laterality: Left;  ? ? ?Family History  ?Problem Relation Age of Onset  ? Asthma Father   ? Diabetes Sister   ? Diabetes  Brother   ? ? ? ? ? ? ? ?Review of Systems  ?Constitutional:  Negative for diaphoresis.  ?Eyes:  Negative for pain.  ?Respiratory:  Negative for shortness of breath.   ?Cardiovascular:  Negative for chest pain, palpitations and leg swelling.  ?Gastrointestinal:  Negative for abdominal pain.  ?Endocrine: Negative for polydipsia.  ?Skin:  Negative for rash.  ?Neurological:  Negative for dizziness, weakness and headaches.  ?Hematological:  Does not bruise/bleed easily.  ?All other systems reviewed and are negative. ? ?   ?Objective:  ? Physical Exam ?Vitals and nursing note reviewed.  ?Constitutional:   ?   General: She is not in acute distress. ?   Appearance: Normal appearance. She is well-developed.  ?  HENT:  ?   Head: Normocephalic.  ?   Right Ear: Tympanic membrane normal.  ?   Left Ear: Tympanic membrane normal.  ?   Nose: Nose normal.  ?   Mouth/Throat:  ?   Mouth: Mucous membranes are moist.  ?Eyes:  ?   Pupils: Pupils are equal, round, and reactive to light.  ?Neck:  ?   Vascular: No carotid bruit or JVD.  ?Cardiovascular:  ?   Rate and Rhythm: Normal rate and regular rhythm.  ?   Heart sounds: Normal heart sounds.  ?Pulmonary:  ?   Effort: Pulmonary effort is normal. No respiratory distress.  ?   Breath sounds: Normal breath sounds. No wheezing or rales.  ?Chest:  ?   Chest wall: No tenderness.  ?Abdominal:  ?   General: Bowel sounds are normal. There is no distension or abdominal bruit.  ?   Palpations: Abdomen is soft. There is no hepatomegaly, splenomegaly, mass or pulsatile mass.  ?   Tenderness: There is no abdominal tenderness.  ?Musculoskeletal:     ?   General: Normal range of motion.  ?   Cervical back: Normal range of motion and neck supple.  ?   Comments: Limp on right side  ?Lymphadenopathy:  ?   Cervical: No cervical adenopathy.  ?Skin: ?   General: Skin is warm and dry.  ?Neurological:  ?   Mental Status: She is alert and oriented to person, place, and time.  ?   Deep Tendon Reflexes: Reflexes  are normal and symmetric.  ?Psychiatric:     ?   Behavior: Behavior normal.     ?   Thought Content: Thought content normal.     ?   Judgment: Judgment normal.  ? ? ?BP (!) 116/59   Pulse (!) 52   Temp

## 2021-09-04 NOTE — Addendum Note (Signed)
Addended by: Chevis Pretty on: 09/04/2021 11:11 AM ? ? Modules accepted: Orders ? ?

## 2021-09-05 ENCOUNTER — Ambulatory Visit: Payer: Medicare Other | Admitting: Nurse Practitioner

## 2021-09-05 LAB — CMP14+EGFR
ALT: 7 IU/L (ref 0–32)
AST: 12 IU/L (ref 0–40)
Albumin/Globulin Ratio: 2 (ref 1.2–2.2)
Albumin: 4.3 g/dL (ref 3.7–4.7)
Alkaline Phosphatase: 101 IU/L (ref 44–121)
BUN/Creatinine Ratio: 13 (ref 12–28)
BUN: 12 mg/dL (ref 8–27)
Bilirubin Total: 0.7 mg/dL (ref 0.0–1.2)
CO2: 31 mmol/L — ABNORMAL HIGH (ref 20–29)
Calcium: 9.8 mg/dL (ref 8.7–10.3)
Chloride: 100 mmol/L (ref 96–106)
Creatinine, Ser: 0.91 mg/dL (ref 0.57–1.00)
Globulin, Total: 2.1 g/dL (ref 1.5–4.5)
Glucose: 81 mg/dL (ref 70–99)
Potassium: 3.3 mmol/L — ABNORMAL LOW (ref 3.5–5.2)
Sodium: 144 mmol/L (ref 134–144)
Total Protein: 6.4 g/dL (ref 6.0–8.5)
eGFR: 64 mL/min/{1.73_m2} (ref >59)

## 2021-09-05 LAB — LIPID PANEL
Chol/HDL Ratio: 1.7 ratio (ref 0.0–4.4)
Cholesterol, Total: 124 mg/dL (ref 100–199)
HDL: 74 mg/dL (ref >39)
LDL Chol Calc (NIH): 35 mg/dL (ref 0–99)
Triglycerides: 78 mg/dL (ref 0–149)
VLDL Cholesterol Cal: 15 mg/dL (ref 5–40)

## 2021-09-05 LAB — CBC WITH DIFFERENTIAL/PLATELET
Basophils Absolute: 0.1 10*3/uL (ref 0.0–0.2)
Basos: 1 %
EOS (ABSOLUTE): 0.2 10*3/uL (ref 0.0–0.4)
Eos: 4 %
Hematocrit: 33.9 % — ABNORMAL LOW (ref 34.0–46.6)
Hemoglobin: 10.9 g/dL — ABNORMAL LOW (ref 11.1–15.9)
Immature Grans (Abs): 0 10*3/uL (ref 0.0–0.1)
Immature Granulocytes: 0 %
Lymphocytes Absolute: 2 10*3/uL (ref 0.7–3.1)
Lymphs: 39 %
MCH: 29.6 pg (ref 26.6–33.0)
MCHC: 32.2 g/dL (ref 31.5–35.7)
MCV: 92 fL (ref 79–97)
Monocytes Absolute: 0.6 10*3/uL (ref 0.1–0.9)
Monocytes: 11 %
Neutrophils Absolute: 2.3 10*3/uL (ref 1.4–7.0)
Neutrophils: 45 %
Platelets: 203 10*3/uL (ref 150–450)
RBC: 3.68 x10E6/uL — ABNORMAL LOW (ref 3.77–5.28)
RDW: 13 % (ref 11.7–15.4)
WBC: 5.2 10*3/uL (ref 3.4–10.8)

## 2021-09-05 MED ORDER — POTASSIUM CHLORIDE CRYS ER 10 MEQ PO TBCR: 10.0000 meq | EXTENDED_RELEASE_TABLET | Freq: Two times a day (BID) | ORAL | 1 refills | Status: DC

## 2021-09-05 NOTE — Addendum Note (Signed)
Addended by: Chevis Pretty on: 09/05/2021 08:53 AM ? ? Modules accepted: Orders ? ?

## 2021-09-06 ENCOUNTER — Telehealth: Payer: Self-pay | Admitting: Nurse Practitioner

## 2021-09-06 NOTE — Telephone Encounter (Signed)
Patient's niece informed to take Potassium once per day.

## 2021-09-08 LAB — TOXASSURE SELECT 13 (MW), URINE

## 2021-10-29 ENCOUNTER — Ambulatory Visit: Payer: Medicare Other | Admitting: Gastroenterology

## 2021-10-29 ENCOUNTER — Encounter: Payer: Self-pay | Admitting: Gastroenterology

## 2021-10-29 VITALS — BP 101/56 | HR 55 | Temp 97.9°F | Ht 65.0 in | Wt 138.6 lb

## 2021-10-29 DIAGNOSIS — K5900 Constipation, unspecified: Secondary | ICD-10-CM

## 2021-10-29 DIAGNOSIS — B9681 Helicobacter pylori [H. pylori] as the cause of diseases classified elsewhere: Secondary | ICD-10-CM | POA: Diagnosis not present

## 2021-10-29 DIAGNOSIS — K219 Gastro-esophageal reflux disease without esophagitis: Secondary | ICD-10-CM | POA: Diagnosis not present

## 2021-10-29 DIAGNOSIS — K2970 Gastritis, unspecified, without bleeding: Secondary | ICD-10-CM

## 2021-10-29 NOTE — Progress Notes (Signed)
GI Office Note    Referring Provider: Chevis Pretty, * Primary Care Physician:  Chevis Pretty, FNP  Primary Gastroenterologist: Elon Alas. Abbey Chatters, DO   Chief Complaint   Chief Complaint  Patient presents with   Follow-up    Having issues with constipation.     History of Present Illness   Teresa Holloway is a 81 y.o. female presenting today for follow-up.  She was initially seen back in January for chronic constipation.  She also had multiple GI complaints including intermittent melena, rectal bleeding, 30 pound weight loss.  Patient was advised to start MiraLAX 1 capful daily to 2 capfuls daily for constipation.  If needed she could also add bisacodyl 5 mg daily.  Advised to increase her fiber or take over-the-counter Benefiber/Metamucil and to drink 4 to 6 glasses of water daily for constipation.  Weight fluctuates quite a bit over the past 6 to 8 months.  She weighed 140 pounds in November 2022.  Her highest weight was 145 pounds in January.  She weighed 142 pounds in February.  Current weight 138 pounds.  In July 2022 she weighed 149 pounds.    Typically has a BM every day but has been 2-3 days since last stool. [place] 5. Takes miralax one capful every other day. Using stool softener with laxative two daily. Denies brbpr. Some mid abdominal pain. No heartburn. Appetite has been good. Usually 3 meals per day.  Typically for breakfast she eats cereal or a honey bun but twice per week her son brings her bacon and eggs.  For lunch and supper she often eats a sandwich or a salad.  Sometimes eats frozen spaghetti.  Drinks tea or lemonade.  Snacks on potato chips.  Patient states she completed H. pylori treatment.    Colonoscopy March 2023: - Non-bleeding internal hemorrhoids. - The examination was otherwise normal. - No specimens collected.  EGD March 2023: - Z-line regular, 39 cm from the incisors. - Gastritis. Biopsied. - Normal duodenal bulb, first  portion of the duodenum and second portion of the duodenum. - Small hiatal hernia. -Gastric biopsy showed H. pylori associated gastritis.  Patient treated with clarithromycin and amoxicillin along with pantoprazole 40 mg twice daily.  Medications   Current Outpatient Medications  Medication Sig Dispense Refill   amLODipine (NORVASC) 5 MG tablet Take 1 tablet (5 mg total) by mouth daily. 90 tablet 0   aspirin EC 81 MG tablet Take 1 tablet (81 mg total) by mouth 2 (two) times daily. (Patient taking differently: Take 81 mg by mouth daily as needed.) 60 tablet 0   atorvastatin (LIPITOR) 40 MG tablet TAKE 1 TABLET BY MOUTH DAILY AT 6 PM. 90 tablet 1   benazepril (LOTENSIN) 40 MG tablet Take 1 tablet (40 mg total) by mouth daily. 90 tablet 1   cholecalciferol (VITAMIN D) 1000 UNITS tablet Take 1,000 Units by mouth daily.     clopidogrel (PLAVIX) 75 MG tablet Take 1 tablet (75 mg total) by mouth daily. 90 tablet 1   diclofenac sodium (VOLTAREN) 1 % GEL Apply 2 g topically 4 (four) times daily. (Patient taking differently: Apply 2 g topically 4 (four) times daily as needed (pain).) 500 g 1   escitalopram (LEXAPRO) 20 MG tablet Take 1 tablet (20 mg total) by mouth daily. 90 tablet 1   hydrochlorothiazide (HYDRODIURIL) 25 MG tablet Take 1 tablet (25 mg total) by mouth daily. 90 tablet 1   isosorbide mononitrate (IMDUR) 60 MG 24 hr tablet  TAKE 1 TABLET BY MOUTH EVERYDAY AT BEDTIME 90 tablet 1   latanoprost (XALATAN) 0.005 % ophthalmic solution 1 drop at bedtime.     metoprolol succinate (TOPROL-XL) 50 MG 24 hr tablet Take 1 tablet (50 mg total) by mouth daily. Take with or immediately following a meal. 90 tablet 1   nitroGLYCERIN (NITROSTAT) 0.4 MG SL tablet Place 1 tablet (0.4 mg total) under the tongue every 5 (five) minutes as needed for chest pain. 30 tablet 1   pantoprazole (PROTONIX) 40 MG tablet Take 1 tablet (40 mg total) by mouth 2 (two) times daily. 60 tablet 5   potassium chloride (KLOR-CON M)  10 MEQ tablet Take 1 tablet (10 mEq total) by mouth 2 (two) times daily. 90 tablet 1   traMADol (ULTRAM) 50 MG tablet Take 1 tablet (50 mg total) by mouth 2 (two) times daily. 60 tablet 2   No current facility-administered medications for this visit.    Allergies   Allergies as of 10/29/2021 - Review Complete 10/29/2021  Allergen Reaction Noted   Atarax [hydroxyzine] Nausea And Vomiting 08/10/2012   Bactrim [sulfamethoxazole-trimethoprim] Nausea Only 03/23/2015   Coreg [carvedilol] Other (See Comments) 08/10/2012     Review of Systems   General: Negative for anorexia,  fever, chills, fatigue, weakness. See hpi ENT: Negative for hoarseness, difficulty swallowing , nasal congestion. CV: Negative for chest pain, angina, palpitations, dyspnea on exertion, peripheral edema.  Respiratory: Negative for dyspnea at rest, dyspnea on exertion, cough, sputum, wheezing.  GI: See history of present illness. GU:  Negative for dysuria, hematuria, urinary incontinence, urinary frequency, nocturnal urination.  Endo: Negative for unusual weight change.     Physical Exam   BP (!) 101/56 (BP Location: Left Arm, Patient Position: Sitting, Cuff Size: Normal)   Pulse (!) 55   Temp 97.9 F (36.6 C) (Oral)   Ht '5\' 5"'$  (1.651 m)   Wt 138 lb 9.6 oz (62.9 kg)   SpO2 94%   BMI 23.06 kg/m    General: Well-nourished, well-developed in no acute distress.  Eyes: No icterus. Mouth: Oropharyngeal mucosa moist and pink , no lesions erythema or exudate. Lungs: Clear to auscultation bilaterally.  Heart: Regular rate and rhythm, no murmurs rubs or gallops.  Abdomen: Bowel sounds are normal, nontender, nondistended, no hepatosplenomegaly or masses,  no abdominal bruits or hernia , no rebound or guarding.  Rectal: not performed Extremities: No lower extremity edema. No clubbing or deformities. Neuro: Alert and oriented x 4   Skin: Warm and dry, no jaundice.   Psych: Alert and cooperative, normal mood and  affect.  Labs   Lab Results  Component Value Date   CREATININE 0.91 09/04/2021   BUN 12 09/04/2021   NA 144 09/04/2021   K 3.3 (L) 09/04/2021   CL 100 09/04/2021   CO2 31 (H) 09/04/2021   Lab Results  Component Value Date   ALT 7 09/04/2021   AST 12 09/04/2021   ALKPHOS 101 09/04/2021   BILITOT 0.7 09/04/2021   Lab Results  Component Value Date   WBC 5.2 09/04/2021   HGB 10.9 (L) 09/04/2021   HCT 33.9 (L) 09/04/2021   MCV 92 09/04/2021   PLT 203 09/04/2021   No results found for: "TSH"  Imaging Studies   No results found.  Assessment     Constipation: chronic, using otc laxatives/stool softeners. Will have her use miralax daily and use laxatives as needed. Instructions written.  Weight loss: weight fluctuates. Similar weight to 02/2021 but overall  down 10 pounds in the past one year. Eats snacks and sandwiches since she lives alone. Continue to monitor for now.   Anemia: follow labs. Check for IDA. Rectal bleeding resolved.   H.pylori gastritis: completed treatment. Need to confirm eradication. Still with some mid abd pain. No heartburn.   PLAN   Take MiraLAX 1 capful every day.  If needed can take up to 2 times daily.  If still needs additional help, can add bisacodyl 5 mg daily as needed.  Patient is to call if regimen not effective.   Continue to monitor weight at home.  If weight drops below 135 pounds, she is to let us know.   Update labs.  Check H. pylori.  Patient was given verbal and written instructions to stop pantoprazole for 2 weeks and then collect stool specimen.    Laureen Ochs. Bobby Rumpf, [place], [facility]

## 2021-10-29 NOTE — Patient Instructions (Addendum)
We checked with [facility] and the medications listed is what they had on file that you are taking. I am not sure of any other the counter medications you are on. Please bring all of your medications to your appointments to ensure your provider knows everything you are taking.  For constipation: take miralax one capful once EVERY day.  After several days, if you find you continue to have issues with constipation, you can take 2 capfuls of MiraLAX every day.  If you still need additional help with constipation, you can add bisacodyl 5 mg daily as needed.  Please call if this regimen is not effective. Please continue to monitor your weight at home.  If your weight consistently drops less than 135 pounds, you should let us know. We will need to make sure the H. pylori infection in your stomach was adequately treated earlier this year.  In order to do this, we need to to collect a stool specimen.  You will need to pick up a stool container from the lab at [facility] and drop it back there when completed.   You will also get blood work done at the same time.  Before you collect your stool specimen, you will need to be off any antibiotics for 2 weeks as well as pantoprazole for 2 weeks.  While you are off pantoprazole, if you need something for acid reflux you can take Pepcid or Tums.  You cannot take Pepto-Bismol. Once you collect your stool specimen you can start back on pantoprazole but you only need to take it ONCE per day. We will be in touch with results as available.

## 2021-10-30 ENCOUNTER — Other Ambulatory Visit: Payer: Medicare Other

## 2021-10-31 ENCOUNTER — Other Ambulatory Visit: Payer: Medicare Other

## 2021-10-31 DIAGNOSIS — K5900 Constipation, unspecified: Secondary | ICD-10-CM | POA: Diagnosis not present

## 2021-10-31 DIAGNOSIS — K219 Gastro-esophageal reflux disease without esophagitis: Secondary | ICD-10-CM | POA: Diagnosis not present

## 2021-10-31 DIAGNOSIS — B9681 Helicobacter pylori [H. pylori] as the cause of diseases classified elsewhere: Secondary | ICD-10-CM | POA: Diagnosis not present

## 2021-10-31 DIAGNOSIS — K2970 Gastritis, unspecified, without bleeding: Secondary | ICD-10-CM | POA: Diagnosis not present

## 2021-11-01 LAB — IRON,TIBC AND FERRITIN PANEL
Ferritin: 33 ng/mL (ref 15–150)
Iron Saturation: 20 % (ref 15–55)
Iron: 74 ug/dL (ref 27–139)
Total Iron Binding Capacity: 376 ug/dL (ref 250–450)
UIBC: 302 ug/dL (ref 118–369)

## 2021-11-01 LAB — CBC WITH DIFFERENTIAL/PLATELET
Basophils Absolute: 0.1 10*3/uL (ref 0.0–0.2)
Basos: 2 %
EOS (ABSOLUTE): 0.2 10*3/uL (ref 0.0–0.4)
Eos: 3 %
Hematocrit: 31.4 % — ABNORMAL LOW (ref 34.0–46.6)
Hemoglobin: 10.4 g/dL — ABNORMAL LOW (ref 11.1–15.9)
Immature Grans (Abs): 0 10*3/uL (ref 0.0–0.1)
Immature Granulocytes: 0 %
Lymphocytes Absolute: 1.7 10*3/uL (ref 0.7–3.1)
Lymphs: 32 %
MCH: 30.1 pg (ref 26.6–33.0)
MCHC: 33.1 g/dL (ref 31.5–35.7)
MCV: 91 fL (ref 79–97)
Monocytes Absolute: 0.6 10*3/uL (ref 0.1–0.9)
Monocytes: 11 %
Neutrophils Absolute: 2.8 10*3/uL (ref 1.4–7.0)
Neutrophils: 52 %
Platelets: 226 10*3/uL (ref 150–450)
RBC: 3.46 x10E6/uL — ABNORMAL LOW (ref 3.77–5.28)
RDW: 12.5 % (ref 11.7–15.4)
WBC: 5.4 10*3/uL (ref 3.4–10.8)

## 2021-11-01 LAB — B12 AND FOLATE PANEL
Folate: 16.7 ng/mL (ref >3.0)
Vitamin B-12: 234 pg/mL (ref 232–1245)

## 2021-11-02 ENCOUNTER — Encounter: Payer: Self-pay | Admitting: Gastroenterology

## 2021-11-02 LAB — H. PYLORI ANTIGEN, STOOL: H pylori Ag, Stl: NEGATIVE

## 2021-11-05 ENCOUNTER — Encounter: Payer: Self-pay | Admitting: Internal Medicine

## 2021-11-06 ENCOUNTER — Other Ambulatory Visit: Payer: Self-pay

## 2021-11-06 DIAGNOSIS — D509 Iron deficiency anemia, unspecified: Secondary | ICD-10-CM

## 2021-11-30 ENCOUNTER — Other Ambulatory Visit: Payer: Self-pay | Admitting: Nurse Practitioner

## 2021-11-30 DIAGNOSIS — E876 Hypokalemia: Secondary | ICD-10-CM

## 2021-12-04 ENCOUNTER — Ambulatory Visit (INDEPENDENT_AMBULATORY_CARE_PROVIDER_SITE_OTHER): Payer: Medicare Other

## 2021-12-04 ENCOUNTER — Ambulatory Visit (INDEPENDENT_AMBULATORY_CARE_PROVIDER_SITE_OTHER): Payer: Medicare Other | Admitting: Nurse Practitioner

## 2021-12-04 ENCOUNTER — Encounter: Payer: Self-pay | Admitting: Nurse Practitioner

## 2021-12-04 DIAGNOSIS — Z780 Asymptomatic menopausal state: Secondary | ICD-10-CM | POA: Diagnosis not present

## 2021-12-04 DIAGNOSIS — M810 Age-related osteoporosis without current pathological fracture: Secondary | ICD-10-CM | POA: Diagnosis not present

## 2021-12-04 DIAGNOSIS — M1611 Unilateral primary osteoarthritis, right hip: Secondary | ICD-10-CM

## 2021-12-04 DIAGNOSIS — M85851 Other specified disorders of bone density and structure, right thigh: Secondary | ICD-10-CM | POA: Diagnosis not present

## 2021-12-04 MED ORDER — TRAMADOL HCL 50 MG PO TABS: 50.0000 mg | ORAL_TABLET | Freq: Two times a day (BID) | ORAL | 2 refills | Status: DC

## 2021-12-04 NOTE — Patient Instructions (Signed)

## 2021-12-04 NOTE — Progress Notes (Signed)
Subjective:    Patient ID: Teresa Holloway, female    DOB: 1940/08/28, 81 y.o.   MRN: [id number]   Chief Complaint: Pain Management   HPI Pain assessment: Cause of pain- arthritis Pain location- back and hips Pain on scale of 1-10- 7/10 Frequency- daily What increases pain-to much walking or standing What makes pain Better-rest helps Effects on ADL - none Any change in general medical condition-none  Current opioids rx- ultram 50mg  BID # meds rx- 60 Effectiveness of current meds-helps Adverse reactions from pain meds-none Morphine equivalent-  Pill count performed-No Last drug screen - 09/04/21 ( high risk q44m, moderate risk q55m, low risk yearly ) Urine drug screen today- No Was the NCCSR reviewed- yes  If yes were their any concerning findings? - no   Overdose risk: 1   Pain contract signed on:12/04/21     Review of Systems  Constitutional:  Negative for diaphoresis.  Eyes:  Negative for pain.  Respiratory:  Negative for shortness of breath.   Cardiovascular:  Negative for chest pain, palpitations and leg swelling.  Gastrointestinal:  Negative for abdominal pain.  Endocrine: Negative for polydipsia.  Skin:  Negative for rash.  Neurological:  Negative for dizziness, weakness and headaches.  Hematological:  Does not bruise/bleed easily.  All other systems reviewed and are negative.      Objective:   Physical Exam Constitutional:      Appearance: Normal appearance.  Cardiovascular:     Rate and Rhythm: Normal rate and regular rhythm.     Heart sounds: Normal heart sounds.  Pulmonary:     Effort: Pulmonary effort is normal.     Breath sounds: Normal breath sounds.  Musculoskeletal:     Comments: Rises slowly from sitting to standing Gait slow and steady Walks with cane  Skin:    General: Skin is warm.  Neurological:     General: No focal deficit present.     Mental Status: She is alert and oriented to person, place, and time.   Psychiatric:        Mood and Affect: Mood normal.        Behavior: Behavior normal.   BP 124/64   Pulse (!) 50   Temp (!) 97.4 F (36.3 C) (Temporal)   Resp 20   Ht 5\' 5"  (1.651 m)   Wt 138 lb (62.6 kg)   SpO2 96%   BMI 22.96 kg/m        Assessment & Plan:   Teresa Holloway in today with chief complaint of Pain Management   1. Primary osteoarthritis of right hip Moist heat Rest Continue to walk with cane See ortho to find out if need Hip replacement - traMADol (ULTRAM) 50 MG tablet; Take 1 tablet (50 mg total) by mouth 2 (two) times daily.  Dispense: 60 tablet; Refill: 2    The above assessment and management plan was discussed with the patient. The patient verbalized understanding of and has agreed to the management plan. Patient is aware to call the clinic if symptoms persist or worsen. Patient is aware when to return to the clinic for a follow-up visit. Patient educated on when it is appropriate to go to the emergency department.   Mary-Margaret Daphine Deutscher, FNP

## 2021-12-11 ENCOUNTER — Telehealth: Payer: Self-pay

## 2021-12-11 NOTE — Chronic Care Management (AMB) (Signed)
Chronic Care Management   Note  12/11/2021 Name: JALEYAH ROSENGRANT MRN: [id number] DOB: 1940/08/05  [name] is a 81 y.o. year old female who is a primary care patient of Bennie Pierini, FNP. I reached out to [name] by phone today in response to a referral sent by Ms. Oliver Hum Piechocki's PCP.  Ms. Morell was given information about Chronic Care Management services today including:  CCM service includes personalized support from designated clinical staff supervised by her physician, including individualized plan of care and coordination with other care providers 24/7 contact phone numbers for assistance for urgent and routine care needs. Service will only be billed when office clinical staff spend 20 minutes or more in a month to coordinate care. Only one practitioner may furnish and bill the service in a calendar month. The patient may stop CCM services at any time (effective at the end of the month) by phone call to the office staff. The patient is responsible for co-pay (up to 20% after annual deductible is met) if co-pay is required by the individual health plan.   Patient agreed to services and verbal consent obtained.   Follow up plan: Telephone appointment with care management team member scheduled for:02/06/2022  Penne Lash, RMA Care Guide [facility]  [place], [place] [address] Direct Dial: [phone] [email or web address]

## 2021-12-14 ENCOUNTER — Other Ambulatory Visit: Payer: Self-pay | Admitting: Nurse Practitioner

## 2021-12-14 DIAGNOSIS — E876 Hypokalemia: Secondary | ICD-10-CM

## 2022-01-21 ENCOUNTER — Other Ambulatory Visit: Payer: Self-pay

## 2022-01-21 DIAGNOSIS — D509 Iron deficiency anemia, unspecified: Secondary | ICD-10-CM

## 2022-01-24 ENCOUNTER — Other Ambulatory Visit: Payer: Self-pay | Admitting: Nurse Practitioner

## 2022-01-24 DIAGNOSIS — I10 Essential (primary) hypertension: Secondary | ICD-10-CM

## 2022-01-24 DIAGNOSIS — F3342 Major depressive disorder, recurrent, in full remission: Secondary | ICD-10-CM

## 2022-01-24 DIAGNOSIS — K219 Gastro-esophageal reflux disease without esophagitis: Secondary | ICD-10-CM

## 2022-01-25 ENCOUNTER — Other Ambulatory Visit: Payer: Self-pay | Admitting: Nurse Practitioner

## 2022-01-25 DIAGNOSIS — M1611 Unilateral primary osteoarthritis, right hip: Secondary | ICD-10-CM

## 2022-02-05 ENCOUNTER — Encounter: Payer: Self-pay | Admitting: Gastroenterology

## 2022-02-05 ENCOUNTER — Ambulatory Visit: Payer: Medicare Other | Admitting: Gastroenterology

## 2022-02-05 VITALS — BP 115/63 | HR 68 | Temp 97.9°F | Ht 65.0 in | Wt 139.2 lb

## 2022-02-05 DIAGNOSIS — D649 Anemia, unspecified: Secondary | ICD-10-CM

## 2022-02-05 DIAGNOSIS — K5900 Constipation, unspecified: Secondary | ICD-10-CM | POA: Diagnosis not present

## 2022-02-05 NOTE — Patient Instructions (Addendum)
Please have your son confirm you medication list. Compare what you are taking with what we have on file. See print out provided today.  Please let us know that name of the "little yellow round pill" you are taking. Please make sure you are taking Vitamin B12 1026mg daily. We added this after your labs a couple of months ago. Please complete your lab work, orders provided today. Return office visit in six months.

## 2022-02-05 NOTE — Progress Notes (Signed)
GI Office Note    Referring Provider: Chevis Pretty, * Primary Care Physician:  Chevis Pretty, FNP  Primary Gastroenterologist: Elon Alas. Abbey Chatters, DO   Chief Complaint   Chief Complaint  Patient presents with   Follow-up    Doing well.     History of Present Illness   Teresa Holloway is a 81 y.o. female presenting today for follow up. She was last seen in 10/2021.  History of chronic constipation, H. pylori gastritis status posttreatment (confirmed eradication in July by stool antigen), anemia.  Labs from July 2023 showed stable hemoglobin (10.4), B12 low normal, folate normal, iron normal.  Recommended to take over-the-counter vitamin B12 1000 mcg daily.  She is due for repeat labs at this time.  She presents today without medication list.  We were unable to confirm that she is taking vitamin B12.  She also states that she is taking a "little yellow-brown pill" but we are not sure what this is.  She gets it without a prescription.  She feels well.  Takes Miralax every night. BMs are doing well. Said her stools were dark and black about a month ago but this cleared up. Denies Pepto Bismol. No abdominal pain. No n/v. No heartburn.  Weight has been stable.     Colonoscopy March 2023: - Non-bleeding internal hemorrhoids. - The examination was otherwise normal. - No specimens collected.   EGD March 2023: - Z-line regular, 39 cm from the incisors. - Gastritis. Biopsied. - Normal duodenal bulb, first portion of the duodenum and second portion of the duodenum. - Small hiatal hernia. -Gastric biopsy showed H. pylori associated gastritis.  Patient treated with clarithromycin and amoxicillin along with pantoprazole 40 mg twice daily.     Medications   Current Outpatient Medications  Medication Sig Dispense Refill   amLODipine (NORVASC) 5 MG tablet TAKE 1 TABLET (5 MG TOTAL) BY MOUTH DAILY. 90 tablet 0   aspirin EC 81 MG tablet Take 1 tablet (81 mg total) by  mouth 2 (two) times daily. (Patient taking differently: Take 81 mg by mouth daily as needed.) 60 tablet 0   atorvastatin (LIPITOR) 40 MG tablet TAKE 1 TABLET BY MOUTH DAILY AT 6 PM. 90 tablet 1   benazepril (LOTENSIN) 40 MG tablet Take 1 tablet (40 mg total) by mouth daily. 90 tablet 1   cholecalciferol (VITAMIN D) 1000 UNITS tablet Take 1,000 Units by mouth daily.     clopidogrel (PLAVIX) 75 MG tablet Take 1 tablet (75 mg total) by mouth daily. 90 tablet 1   diclofenac sodium (VOLTAREN) 1 % GEL Apply 2 g topically 4 (four) times daily. (Patient taking differently: Apply 2 g topically 4 (four) times daily as needed (pain).) 500 g 1   escitalopram (LEXAPRO) 20 MG tablet TAKE 1 TABLET BY MOUTH EVERY DAY 90 tablet 0   hydrochlorothiazide (HYDRODIURIL) 25 MG tablet Take 1 tablet (25 mg total) by mouth daily. 90 tablet 1   isosorbide mononitrate (IMDUR) 60 MG 24 hr tablet TAKE 1 TABLET BY MOUTH EVERYDAY AT BEDTIME 90 tablet 1   KLOR-CON M10 10 MEQ tablet TAKE 1 TABLET BY MOUTH TWICE A DAY 180 tablet 0   latanoprost (XALATAN) 0.005 % ophthalmic solution 1 drop at bedtime.     metoprolol succinate (TOPROL-XL) 50 MG 24 hr tablet Take 1 tablet (50 mg total) by mouth daily. Take with or immediately following a meal. 90 tablet 1   nitroGLYCERIN (NITROSTAT) 0.4 MG SL tablet Place 1 tablet (  0.4 mg total) under the tongue every 5 (five) minutes as needed for chest pain. 30 tablet 1   traMADol (ULTRAM) 50 MG tablet Take 1 tablet (50 mg total) by mouth 2 (two) times daily. 60 tablet 2   No current facility-administered medications for this visit.    Allergies   Allergies as of 02/05/2022 - Review Complete 02/05/2022  Allergen Reaction Noted   Atarax [hydroxyzine] Nausea And Vomiting 08/10/2012   Bactrim [sulfamethoxazole-trimethoprim] Nausea Only 03/23/2015   Coreg [carvedilol] Other (See Comments) 08/10/2012     Review of Systems   General: Negative for anorexia, weight loss, fever, chills, fatigue,  weakness. ENT: Negative for hoarseness, difficulty swallowing , nasal congestion. CV: Negative for chest pain, angina, palpitations, dyspnea on exertion, peripheral edema.  Respiratory: Negative for dyspnea at rest, dyspnea on exertion, cough, sputum, wheezing.  GI: See history of present illness. GU:  Negative for dysuria, hematuria, urinary incontinence, urinary frequency, nocturnal urination.  Endo: Negative for unusual weight change.     Physical Exam   BP 115/63 (BP Location: Right Arm, Patient Position: Sitting, Cuff Size: Normal)   Pulse 68   Temp 97.9 F (36.6 C) (Oral)   Ht '5\' 5"'$  (1.651 m)   Wt 139 lb 3.2 oz (63.1 kg)   SpO2 96%   BMI 23.16 kg/m    General: Well-nourished, well-developed in no acute distress.  Eyes: No icterus. Mouth: Oropharyngeal mucosa moist and pink , no lesions erythema or exudate. Abdomen: Bowel sounds are normal, nontender, nondistended, no hepatosplenomegaly or masses,  no abdominal bruits or hernia , no rebound or guarding.  Rectal: not performed  Extremities: No lower extremity edema. No clubbing or deformities. Neuro: Alert and oriented x 4   Skin: Warm and dry, no jaundice.   Psych: Alert and cooperative, normal mood and affect.  Labs   Lab Results  Component Value Date   CREATININE 0.91 09/04/2021   BUN 12 09/04/2021   NA 144 09/04/2021   K 3.3 (L) 09/04/2021   CL 100 09/04/2021   CO2 31 (H) 09/04/2021   Lab Results  Component Value Date   ALT 7 09/04/2021   AST 12 09/04/2021   ALKPHOS 101 09/04/2021   BILITOT 0.7 09/04/2021   Lab Results  Component Value Date   WBC 5.4 10/31/2021   HGB 10.4 (L) 10/31/2021   HCT 31.4 (L) 10/31/2021   MCV 91 10/31/2021   PLT 226 10/31/2021   Lab Results  Component Value Date   IRON 74 10/31/2021   TIBC 376 10/31/2021   FERRITIN 33 10/31/2021   Lab Results  Component Value Date   FOLATE 16.7 10/31/2021   Lab Results  Component Value Date   VITAMINB12 234 10/31/2021    Imaging  Studies   No results found.  Assessment   Constipation: Improved on MiraLAX daily.  She may be taking additional over-the-counter laxative at times but she cannot confirm the name of medication she takes sometimes with MiraLAX.  Anemia: Low normal vitamin B12 previously.  Hemoglobin in the 10 range.  EGD and colonoscopy as outlined.  Due for follow-up labs.  She is not sure if she is taking vitamin B12 as recommended.  H. pylori gastritis: Resolved.   PLAN   Complete labs. Patient will have her son compare medications provided on her AVS today with what she is taking at home and let us know if there is any changes.  We need to confirm if she is taking vitamin B12.  Also  we need to know the name "little yellow-brown pill".     Laureen Ochs. Bobby Rumpf, [place], [facility]

## 2022-02-06 ENCOUNTER — Telehealth: Payer: Self-pay

## 2022-02-06 ENCOUNTER — Telehealth: Payer: Medicare Other

## 2022-02-06 NOTE — Telephone Encounter (Signed)
Ok. Dulocolax brand bisacodyl is yellow.   Please let her know that would advise taking miralax every day and using bisacodyl only when needed.  Await further input regarding if she is taking vitamin B12 as previously recommended.  Await lab results.

## 2022-02-06 NOTE — Telephone Encounter (Signed)
Pt was made aware, she states that she has not been taking vitamin b12, she is going to pick them up tomorrow and start taking them. Pt also states that she will be having her labs done on Friday.

## 2022-02-06 NOTE — Telephone Encounter (Signed)
Pt called and left a message stating that the little yellow pill that she takes is dulcolax.

## 2022-02-11 ENCOUNTER — Other Ambulatory Visit: Payer: Medicare Other

## 2022-02-11 DIAGNOSIS — D509 Iron deficiency anemia, unspecified: Secondary | ICD-10-CM | POA: Diagnosis not present

## 2022-02-12 LAB — CBC WITH DIFFERENTIAL/PLATELET
Basophils Absolute: 0.1 10*3/uL (ref 0.0–0.2)
Basos: 2 %
EOS (ABSOLUTE): 0.3 10*3/uL (ref 0.0–0.4)
Eos: 6 %
Hematocrit: 35.2 % (ref 34.0–46.6)
Hemoglobin: 11.4 g/dL (ref 11.1–15.9)
Immature Grans (Abs): 0 10*3/uL (ref 0.0–0.1)
Immature Granulocytes: 0 %
Lymphocytes Absolute: 1.4 10*3/uL (ref 0.7–3.1)
Lymphs: 29 %
MCH: 29.6 pg (ref 26.6–33.0)
MCHC: 32.4 g/dL (ref 31.5–35.7)
MCV: 91 fL (ref 79–97)
Monocytes Absolute: 0.5 10*3/uL (ref 0.1–0.9)
Monocytes: 10 %
Neutrophils Absolute: 2.5 10*3/uL (ref 1.4–7.0)
Neutrophils: 53 %
Platelets: 212 10*3/uL (ref 150–450)
RBC: 3.85 x10E6/uL (ref 3.77–5.28)
RDW: 12.9 % (ref 11.7–15.4)
WBC: 4.7 10*3/uL (ref 3.4–10.8)

## 2022-02-12 LAB — FERRITIN: Ferritin: 35 ng/mL (ref 15–150)

## 2022-02-12 LAB — VITAMIN B12: Vitamin B-12: 706 pg/mL (ref 232–1245)

## 2022-02-26 DIAGNOSIS — I10 Essential (primary) hypertension: Secondary | ICD-10-CM | POA: Diagnosis not present

## 2022-02-26 DIAGNOSIS — Z79899 Other long term (current) drug therapy: Secondary | ICD-10-CM | POA: Diagnosis not present

## 2022-02-26 DIAGNOSIS — I259 Chronic ischemic heart disease, unspecified: Secondary | ICD-10-CM | POA: Diagnosis not present

## 2022-02-26 DIAGNOSIS — Z7982 Long term (current) use of aspirin: Secondary | ICD-10-CM | POA: Diagnosis not present

## 2022-02-26 DIAGNOSIS — I2510 Atherosclerotic heart disease of native coronary artery without angina pectoris: Secondary | ICD-10-CM | POA: Diagnosis not present

## 2022-02-26 DIAGNOSIS — E7800 Pure hypercholesterolemia, unspecified: Secondary | ICD-10-CM | POA: Diagnosis not present

## 2022-02-28 DIAGNOSIS — E860 Dehydration: Secondary | ICD-10-CM | POA: Diagnosis not present

## 2022-02-28 DIAGNOSIS — E785 Hyperlipidemia, unspecified: Secondary | ICD-10-CM | POA: Diagnosis not present

## 2022-02-28 DIAGNOSIS — E876 Hypokalemia: Secondary | ICD-10-CM | POA: Diagnosis not present

## 2022-02-28 DIAGNOSIS — R531 Weakness: Secondary | ICD-10-CM | POA: Diagnosis not present

## 2022-02-28 DIAGNOSIS — I959 Hypotension, unspecified: Secondary | ICD-10-CM | POA: Diagnosis not present

## 2022-02-28 DIAGNOSIS — T887XXA Unspecified adverse effect of drug or medicament, initial encounter: Secondary | ICD-10-CM | POA: Diagnosis not present

## 2022-02-28 DIAGNOSIS — I10 Essential (primary) hypertension: Secondary | ICD-10-CM | POA: Diagnosis not present

## 2022-02-28 DIAGNOSIS — I252 Old myocardial infarction: Secondary | ICD-10-CM | POA: Diagnosis not present

## 2022-03-05 ENCOUNTER — Ambulatory Visit (INDEPENDENT_AMBULATORY_CARE_PROVIDER_SITE_OTHER): Payer: Medicare Other | Admitting: Nurse Practitioner

## 2022-03-05 ENCOUNTER — Encounter: Payer: Self-pay | Admitting: Nurse Practitioner

## 2022-03-05 VITALS — BP 126/68 | HR 56 | Temp 97.3°F | Resp 20 | Ht 65.0 in | Wt 138.0 lb

## 2022-03-05 DIAGNOSIS — Z09 Encounter for follow-up examination after completed treatment for conditions other than malignant neoplasm: Secondary | ICD-10-CM | POA: Diagnosis not present

## 2022-03-05 DIAGNOSIS — I959 Hypotension, unspecified: Secondary | ICD-10-CM

## 2022-03-05 NOTE — Progress Notes (Signed)
Subjective:    Patient ID: Teresa Holloway, female    DOB: Mar 07, 1941, 81 y.o.   MRN: [id number]   Chief Complaint: Hospitalization Follow-up   HPI Patient went to the hospital on 02/28/22. She thought she was having a stroke. Her blood pressure was low and they thought was coming from her metoprolol. They gave her 2 bags of fluids. Since being in hospital her blood pressure has been running around 101/60 this morning. Later it was 135/70.     Review of Systems  Constitutional:  Negative for diaphoresis.  Eyes:  Negative for pain.  Respiratory:  Negative for shortness of breath.   Cardiovascular:  Negative for chest pain, palpitations and leg swelling.  Gastrointestinal:  Negative for abdominal pain.  Endocrine: Negative for polydipsia.  Skin:  Negative for rash.  Neurological:  Negative for dizziness, weakness and headaches.  Hematological:  Does not bruise/bleed easily.  All other systems reviewed and are negative.      Objective:   Physical Exam Constitutional:      Appearance: Normal appearance.  Cardiovascular:     Rate and Rhythm: Normal rate and regular rhythm.     Heart sounds: Normal heart sounds.  Pulmonary:     Effort: Pulmonary effort is normal.     Breath sounds: Normal breath sounds.  Skin:    General: Skin is warm.  Neurological:     General: No focal deficit present.     Mental Status: She is alert and oriented to person, place, and time.  Psychiatric:        Mood and Affect: Mood normal.        Behavior: Behavior normal.     BP 126/68   Pulse (!) 56   Temp (!) 97.3 F (36.3 C) (Temporal)   Resp 20   Ht 5\' 5"  (1.651 m)   Wt 138 lb (62.6 kg)   SpO2 97%   BMI 22.96 kg/m        Assessment & Plan:   Teresa Holloway in today with chief complaint of Hospitalization Follow-up   1. Hypotension, unspecified hypotension type Eat saltine crackers prior to going to bed Keep diary of blood pressure at home  2. Hospital discharge  follow-up Hospital follow up    The above assessment and management plan was discussed with the patient. The patient verbalized understanding of and has agreed to the management plan. Patient is aware to call the clinic if symptoms persist or worsen. Patient is aware when to return to the clinic for a follow-up visit. Patient educated on when it is appropriate to go to the emergency department.   Mary-Margaret Daphine Deutscher, FNP

## 2022-03-05 NOTE — Patient Instructions (Signed)
Hypotension As your heart beats, it forces blood through your body. This force is called blood pressure. If you have hypotension, you have low blood pressure.  When your blood pressure is too low, you may not get enough blood to your brain or other parts of your body. This may cause you to feel weak, light-headed, have a fast heartbeat, or even faint. Low blood pressure may be harmless, or it may cause serious problems. What are the causes? Blood loss. Not enough water in the body (dehydration). Heart problems. Hormone problems. Pregnancy. A very bad infection. Not having enough of certain nutrients. Very bad allergic reactions. Certain medicines. What increases the risk? Age. The risk increases as you get older. Conditions that affect the heart or the brain and spinal cord (central nervous system). What are the signs or symptoms? Feeling: Weak. Light-headed. Dizzy. Tired (fatigued). Blurred vision. Fast heartbeat. Fainting, in very bad cases. How is this treated? Changing your diet. This may involve drinking more water or including more salt (sodium) in your diet by eating high-salt foods. Taking medicines to raise your blood pressure. Changing how much you take (the dosage) of some of your medicines. Wearing compression stockings. These stockings help to prevent blood clots and reduce swelling in your legs. In some cases, you may need to go to the hospital to: Receive fluids through an IV tube. Receive donated blood through an IV tube (transfusion). Get treated for an infection or heart problems, if this applies. Be monitored while medicines that you are taking wear off. Follow these instructions at home: Eating and drinking  Drink enough fluids to keep your pee (urine) pale yellow. Eat a healthy diet. Follow instructions from your doctor about what you can eat or drink. A healthy diet includes: Fresh fruits and vegetables. Whole grains. Low-fat (lean) meats. Low-fat  dairy products. If told, include more salt in your diet. Do not add extra salt to your diet unless your doctor tells you to. Eat small meals often. Avoid standing up quickly after you eat. Medicines Take over-the-counter and prescription medicines only as told by your doctor. Follow instructions from your doctor about changing how much you take of your medicines, if this applies. Do not stop or change any of your medicines on your own. General instructions  Wear compression stockings as told by your doctor. Get up slowly from lying down or sitting. Avoid hot showers and a lot of heat as told by your doctor. Return to your normal activities when your doctor says that it is safe. Do not smoke or use any products that contain nicotine or tobacco. If you need help quitting, ask your doctor. Keep all follow-up visits. Contact a doctor if: You vomit. You have watery poop (diarrhea). You have a fever for more than 2-3 days. You feel more thirsty than normal. You feel weak and tired. Get help right away if: You have chest pain. You have a fast or uneven heartbeat. You lose feeling (have numbness) in any part of your body. You cannot move your arms or your legs. You have trouble talking. You get sweaty or feel light-headed. You faint. You have trouble breathing. You have trouble staying awake. You feel mixed up (confused). These symptoms may be an emergency. Get help right away. Call 911. Do not wait to see if the symptoms will go away. Do not drive yourself to the hospital. Summary Hypotension is also called low blood pressure. It is when the force of blood pumping through your body   is too weak. Hypotension may be harmless, or it may cause serious problems. Treatment may include changing your diet and medicines, and wearing compression stockings. In very bad cases, you may need to go to the hospital. This information is not intended to replace advice given to you by your health care  provider. Make sure you discuss any questions you have with your health care provider. Document Revised: 11/27/2020 Document Reviewed: 11/27/2020 Elsevier Patient Education  2023 [employer] Inc.  

## 2022-03-07 ENCOUNTER — Ambulatory Visit: Payer: Medicare Other | Admitting: Pharmacist

## 2022-03-07 ENCOUNTER — Telehealth: Payer: Self-pay | Admitting: Pharmacist

## 2022-03-07 NOTE — Telephone Encounter (Signed)
03/07/2022 Name: Teresa Holloway MRN: [id number] DOB: [birth year]   Referred by: Bennie Pierini, FNP Reason for referral : Appointment (osteoporosis)  An unsuccessful telephone attempt (x2) was made today.  No voicemail was available as phone line was busy.  The patient was referred to pharmacy clinic for osteoporosis management.  Follow Up Plan: The care management team will reach out to the patient again over the next 5-7 business days to reschedule as needed. Reschedule pharmacy clinic (telephone or in person )     Kieth Brightly, PharmD, BCPS Clinical Pharmacist, [facility]  II Phone [phone]

## 2022-03-08 DIAGNOSIS — H401421 Capsular glaucoma with pseudoexfoliation of lens, left eye, mild stage: Secondary | ICD-10-CM | POA: Diagnosis not present

## 2022-03-08 DIAGNOSIS — H401412 Capsular glaucoma with pseudoexfoliation of lens, right eye, moderate stage: Secondary | ICD-10-CM | POA: Diagnosis not present

## 2022-03-08 DIAGNOSIS — H25813 Combined forms of age-related cataract, bilateral: Secondary | ICD-10-CM | POA: Diagnosis not present

## 2022-03-11 ENCOUNTER — Encounter: Payer: Self-pay | Admitting: Nurse Practitioner

## 2022-03-11 ENCOUNTER — Ambulatory Visit (INDEPENDENT_AMBULATORY_CARE_PROVIDER_SITE_OTHER): Payer: Medicare Other | Admitting: Nurse Practitioner

## 2022-03-11 VITALS — BP 122/64 | HR 51 | Temp 97.1°F | Resp 20 | Ht 65.0 in | Wt 140.0 lb

## 2022-03-11 DIAGNOSIS — Z23 Encounter for immunization: Secondary | ICD-10-CM | POA: Diagnosis not present

## 2022-03-11 DIAGNOSIS — Z6830 Body mass index (BMI) 30.0-30.9, adult: Secondary | ICD-10-CM

## 2022-03-11 DIAGNOSIS — L570 Actinic keratosis: Secondary | ICD-10-CM

## 2022-03-11 DIAGNOSIS — E785 Hyperlipidemia, unspecified: Secondary | ICD-10-CM

## 2022-03-11 DIAGNOSIS — D649 Anemia, unspecified: Secondary | ICD-10-CM

## 2022-03-11 DIAGNOSIS — M810 Age-related osteoporosis without current pathological fracture: Secondary | ICD-10-CM

## 2022-03-11 DIAGNOSIS — K219 Gastro-esophageal reflux disease without esophagitis: Secondary | ICD-10-CM | POA: Diagnosis not present

## 2022-03-11 DIAGNOSIS — K5900 Constipation, unspecified: Secondary | ICD-10-CM

## 2022-03-11 DIAGNOSIS — M1611 Unilateral primary osteoarthritis, right hip: Secondary | ICD-10-CM

## 2022-03-11 DIAGNOSIS — I10 Essential (primary) hypertension: Secondary | ICD-10-CM | POA: Diagnosis not present

## 2022-03-11 DIAGNOSIS — I25111 Atherosclerotic heart disease of native coronary artery with angina pectoris with documented spasm: Secondary | ICD-10-CM

## 2022-03-11 DIAGNOSIS — M1612 Unilateral primary osteoarthritis, left hip: Secondary | ICD-10-CM

## 2022-03-11 DIAGNOSIS — F3342 Major depressive disorder, recurrent, in full remission: Secondary | ICD-10-CM

## 2022-03-11 DIAGNOSIS — E876 Hypokalemia: Secondary | ICD-10-CM

## 2022-03-11 MED ORDER — ATORVASTATIN CALCIUM 40 MG PO TABS: ORAL_TABLET | ORAL | 1 refills | Status: DC

## 2022-03-11 MED ORDER — HYDROCHLOROTHIAZIDE 25 MG PO TABS: 25.0000 mg | ORAL_TABLET | Freq: Every day | ORAL | 1 refills | Status: DC

## 2022-03-11 MED ORDER — TRAMADOL HCL 50 MG PO TABS: 50.0000 mg | ORAL_TABLET | Freq: Two times a day (BID) | ORAL | 2 refills | Status: DC

## 2022-03-11 MED ORDER — AMLODIPINE BESYLATE 5 MG PO TABS: 5.0000 mg | ORAL_TABLET | Freq: Every day | ORAL | 1 refills | Status: DC

## 2022-03-11 MED ORDER — METOPROLOL SUCCINATE ER 25 MG PO TB24: 25.0000 mg | ORAL_TABLET | Freq: Every day | ORAL | 11 refills | Status: DC

## 2022-03-11 MED ORDER — ESCITALOPRAM OXALATE 20 MG PO TABS: 20.0000 mg | ORAL_TABLET | Freq: Every day | ORAL | 1 refills | Status: DC

## 2022-03-11 MED ORDER — BENAZEPRIL HCL 40 MG PO TABS: 40.0000 mg | ORAL_TABLET | Freq: Every day | ORAL | 1 refills | Status: DC

## 2022-03-11 MED ORDER — POTASSIUM CHLORIDE CRYS ER 10 MEQ PO TBCR: 10.0000 meq | EXTENDED_RELEASE_TABLET | Freq: Two times a day (BID) | ORAL | 1 refills | Status: DC

## 2022-03-11 MED ORDER — ISOSORBIDE MONONITRATE ER 60 MG PO TB24: ORAL_TABLET | ORAL | 1 refills | Status: DC

## 2022-03-11 NOTE — Patient Instructions (Signed)

## 2022-03-11 NOTE — Progress Notes (Addendum)
Subjective:    Patient ID: [name], female    DOB: 12-Mar-1941, 81 y.o.   MRN: [id number]   Chief Complaint: medical management of chronic issues     HPI:  LAILANA ZUMBRUN is a 81 y.o. who identifies as a female who was assigned female at birth.   Social history: Lives with: by herself- family checks on her daily Work history: retired   Water engineer in today for follow up of the following chronic medical issues:  1. Essential hypertension, benign No c/o chest pain, sob or headache. Was seen on 03/05/22 for hospital follow up for hypotension. Her metoprolol was cut in half and she was suppose to keep a diary of blood pressures at home. Blood pressure averaging 110's systolic.  2. Coronary artery disease involving native coronary artery of native heart with angina pectoris with documented spasm [facility]) Last saw cardiology on 02/26/22.  Review of office note- metoprolol decreased to 25mg  daily. They stopped her plavix.   3. Hyperlipidemia with target LDL less than 100 Doe snot wtahc diet and does no exercise due to arthritis  4. Gastroesophageal reflux disease, unspecified whether esophagitis present On no prescription meds for this. Only has occasional flare ups  5. Anemia, unspecified type No c/o fatigue Lab Results  Component Value Date   HGB 11.4 02/11/2022     6. Constipation, unspecified constipation type No problems now.is on miralax daily  7. Recurrent major depressive disorder, in full remission (HCC) Has been on lexapro for several years and has been doing ell.    03/05/2022    2:06 PM 12/04/2021   11:36 AM 07/04/2021    2:04 PM  Depression screen PHQ 2/9  Decreased Interest 2 0 0  Down, Depressed, Hopeless 2 0 1  PHQ - 2 Score 4 0 1  Altered sleeping 1 0 0  Tired, decreased energy 2 0 0  Change in appetite 0 0 0  Feeling bad or failure about yourself  2 0 0  Trouble concentrating 0 0   Moving slowly or fidgety/restless 0 0 0  Suicidal thoughts 0 0 0   PHQ-9 Score 9 0 1  Difficult doing work/chores Somewhat difficult Not difficult at all      8. Age related osteoporosis, unspecified pathological fracture presence Last dexacan was done on 12/04/21. T score was -3.1. is currently not on anything  9. Osteoarthritis of hip Pain assessment: Cause of pain- arthritis Pain location- bil hips Pain on scale of 1-10- 5-6/10 Frequency- daily What increases pain-to much walking and standing What makes pain Better-rest Effects on ADL - none Any change in general medical condition-no  Current opioids rx- ultramBID # meds rx- 60 Effectiveness of current meds-helps Adverse reactions from pain meds-none Morphine equivalent- 10 MME  Pill count performed-No Last drug screen - 09/04/21 ( high risk q39m, moderate risk q69m, low risk yearly ) Urine drug screen today- No Was the NCCSR reviewed- yes  If yes were their any concerning findings? - no   Overdose risk: 1   Pain contract signed on: 8/23/233    10. BMI 30.0-30.9,adult No recent weight changes Wt Readings from Last 3 Encounters:  03/11/22 140 lb (63.5 kg)  03/05/22 138 lb (62.6 kg)  02/05/22 139 lb 3.2 oz (63.1 kg)   BMI Readings from Last 3 Encounters:  03/11/22 23.30 kg/m  03/05/22 22.96 kg/m  02/05/22 23.16 kg/m     New complaints: Dry scaly lesion on right anterior shoulder  Allergies  Allergen  Reactions   Atarax [Hydroxyzine] Nausea And Vomiting   Bactrim [Sulfamethoxazole-Trimethoprim] Nausea Only   Coreg [Carvedilol] Other (See Comments)    Unknown   Outpatient Encounter Medications as of 03/11/2022  Medication Sig   amLODipine (NORVASC) 5 MG tablet TAKE 1 TABLET (5 MG TOTAL) BY MOUTH DAILY.   aspirin EC 81 MG tablet Take 1 tablet (81 mg total) by mouth 2 (two) times daily. (Patient taking differently: Take 81 mg by mouth daily as needed.)   atorvastatin (LIPITOR) 40 MG tablet TAKE 1 TABLET BY MOUTH DAILY AT 6 PM.   benazepril (LOTENSIN) 40 MG tablet  Take 1 tablet (40 mg total) by mouth daily.   cholecalciferol (VITAMIN D) 1000 UNITS tablet Take 1,000 Units by mouth daily.   diclofenac sodium (VOLTAREN) 1 % GEL Apply 2 g topically 4 (four) times daily. (Patient taking differently: Apply 2 g topically 4 (four) times daily as needed (pain).)   escitalopram (LEXAPRO) 20 MG tablet TAKE 1 TABLET BY MOUTH EVERY DAY   hydrochlorothiazide (HYDRODIURIL) 25 MG tablet Take 1 tablet (25 mg total) by mouth daily.   isosorbide mononitrate (IMDUR) 60 MG 24 hr tablet TAKE 1 TABLET BY MOUTH EVERYDAY AT BEDTIME   KLOR-CON M10 10 MEQ tablet TAKE 1 TABLET BY MOUTH TWICE A DAY   latanoprost (XALATAN) 0.005 % ophthalmic solution 1 drop at bedtime.   metoprolol succinate (TOPROL-XL) 25 MG 24 hr tablet Take 1 tablet by mouth daily.   nitroGLYCERIN (NITROSTAT) 0.4 MG SL tablet Place 1 tablet (0.4 mg total) under the tongue every 5 (five) minutes as needed for chest pain.   polyethylene glycol powder (GLYCOLAX/MIRALAX) 17 GM/SCOOP powder Take by mouth. 17 grams oral daily for constipation.   traMADol (ULTRAM) 50 MG tablet Take 1 tablet (50 mg total) by mouth 2 (two) times daily.   No facility-administered encounter medications on file as of 03/11/2022.    Past Surgical History:  Procedure Laterality Date   BIOPSY  07/16/2021   Procedure: BIOPSY;  Surgeon: Lanelle Bal, DO;  Location: [facility];  Service: Endoscopy;;   COLONOSCOPY WITH PROPOFOL N/A 07/16/2021   Procedure: COLONOSCOPY WITH PROPOFOL;  Surgeon: Lanelle Bal, DO;  Location: [facility];  Service: Endoscopy;  Laterality: N/A;  10:30am   ECTOPIC PREGNANCY SURGERY     ESOPHAGOGASTRODUODENOSCOPY (EGD) WITH PROPOFOL N/A 07/16/2021   Procedure: ESOPHAGOGASTRODUODENOSCOPY (EGD) WITH PROPOFOL;  Surgeon: Lanelle Bal, DO;  Location: [facility];  Service: Endoscopy;  Laterality: N/A;   left breast biospy     left wrist fracture     TOTAL KNEE ARTHROPLASTY Left 01/25/2019   Procedure: LEFT  TOTAL KNEE ARTHROPLASTY;  Surgeon: Gean Birchwood, MD;  Location: [facility];  Service: Orthopedics;  Laterality: Left;    Family History  Problem Relation Age of Onset   Asthma Father    Diabetes Sister    Diabetes Brother        Review of Systems  Constitutional:  Negative for diaphoresis.  Eyes:  Negative for pain.  Respiratory:  Negative for shortness of breath.   Cardiovascular:  Negative for chest pain, palpitations and leg swelling.  Gastrointestinal:  Negative for abdominal pain.  Endocrine: Negative for polydipsia.  Musculoskeletal:  Positive for arthralgias (bil hip).  Skin:  Negative for rash.  Neurological:  Negative for dizziness, weakness and headaches.  Hematological:  Does not bruise/bleed easily.  All other systems reviewed and are negative.      Objective:   Physical Exam Vitals  and nursing note reviewed.  Constitutional:      General: She is not in acute distress.    Appearance: Normal appearance. She is well-developed.  HENT:     Head: Normocephalic.     Right Ear: Tympanic membrane normal.     Left Ear: Tympanic membrane normal.     Nose: Nose normal.     Mouth/Throat:     Mouth: Mucous membranes are moist.  Eyes:     Pupils: Pupils are equal, round, and reactive to light.  Neck:     Vascular: No carotid bruit or JVD.  Cardiovascular:     Rate and Rhythm: Normal rate and regular rhythm.     Heart sounds: Normal heart sounds.  Pulmonary:     Effort: Pulmonary effort is normal. No respiratory distress.     Breath sounds: Normal breath sounds. No wheezing or rales.  Chest:     Chest wall: No tenderness.  Abdominal:     General: Bowel sounds are normal. There is no distension or abdominal bruit.     Palpations: Abdomen is soft. There is no hepatomegaly, splenomegaly, mass or pulsatile mass.     Tenderness: There is no abdominal tenderness.  Musculoskeletal:        General: Normal range of motion.     Cervical back: Normal range of motion and neck  supple.  Lymphadenopathy:     Cervical: No cervical adenopathy.  Skin:    General: Skin is warm and dry.  Neurological:     Mental Status: She is alert and oriented to person, place, and time.     Deep Tendon Reflexes: Reflexes are normal and symmetric.  Psychiatric:        Behavior: Behavior normal.        Thought Content: Thought content normal.        Judgment: Judgment normal.    BP 122/64   Pulse (!) 51   Temp (!) 97.1 F (36.2 C) (Temporal)   Resp 20   Ht 5\' 5"  (1.651 m)   Wt 140 lb (63.5 kg)   SpO2 97%   BMI 23.30 kg/m   Cryotherapy of keratoisis on right anterior shoulder      Assessment & Plan:   LASHONDRIA SILVERWOOD comes in today with chief complaint of Medical Management of Chronic Issues and Skin tag on right shoulder   Diagnosis and orders addressed:  1. Essential hypertension, benign Low sodium diet - amLODipine (NORVASC) 5 MG tablet; Take 1 tablet (5 mg total) by mouth daily.  Dispense: 90 tablet; Refill: 1 - benazepril (LOTENSIN) 40 MG tablet; Take 1 tablet (40 mg total) by mouth daily.  Dispense: 90 tablet; Refill: 1 - hydrochlorothiazide (HYDRODIURIL) 25 MG tablet; Take 1 tablet (25 mg total) by mouth daily.  Dispense: 90 tablet; Refill: 1  2. Coronary artery disease involving native coronary artery of native heart with angina pectoris with documented spasm (HCC) Keep follow up with crdiology - isosorbide mononitrate (IMDUR) 60 MG 24 hr tablet; TAKE 1 TABLET BY MOUTH EVERYDAY AT BEDTIME  Dispense: 90 tablet; Refill: 1 - metoprolol succinate (TOPROL-XL) 25 MG 24 hr tablet; Take 1 tablet (25 mg total) by mouth daily.  Dispense: 30 tablet; Refill: 11  3. Hyperlipidemia with target LDL less than 100 Low fat diet - atorvastatin (LIPITOR) 40 MG tablet; TAKE 1 TABLET BY MOUTH DAILY AT 6 PM.  Dispense: 90 tablet; Refill: 1  4. Gastroesophageal reflux disease, unspecified whether esophagitis present Avoid spicy foods Do not  eat 2 hours prior to  bedtime  5. Anemia, unspecified type Labs pending  6. Constipation, unspecified constipation type Continue  miralax daily  7. Recurrent major depressive disorder, in full remission (HCC) Stress management - hydrochlorothiazide (HYDRODIURIL) 25 MG tablet; Take 1 tablet (25 mg total) by mouth daily.  Dispense: 90 tablet; Refill: 1 - escitalopram (LEXAPRO) 20 MG tablet; Take 1 tablet (20 mg total) by mouth daily.  Dispense: 90 tablet; Refill: 1  8. Age related osteoporosis, unspecified pathological fracture presence Weight bearing exercise when can tolerate  9. BMI 30.0-30.9,adult Discussed diet and exercise for person with BMI >25 Will recheck weight in 3-6 months   10. Benign keratosis Cryotherapy today  11. Primary osteoarthritis of left hip Use cane tp prevent falls  12. Primary osteoarthritis of right hip - traMADol (ULTRAM) 50 MG tablet; Take 1 tablet (50 mg total) by mouth 2 (two) times daily.  Dispense: 60 tablet; Refill: 2  13. Hypokalemia Labs pending - potassium chloride (KLOR-CON M10) 10 MEQ tablet; Take 1 tablet (10 mEq total) by mouth 2 (two) times daily.  Dispense: 180 tablet; Refill: 1   Labs pending Health Maintenance reviewed Diet and exercise encouraged  Follow up plan: 3 months   Mary-Margaret Daphine Deutscher, FNP

## 2022-03-15 ENCOUNTER — Other Ambulatory Visit: Payer: Self-pay | Admitting: Nurse Practitioner

## 2022-03-15 DIAGNOSIS — I25111 Atherosclerotic heart disease of native coronary artery with angina pectoris with documented spasm: Secondary | ICD-10-CM

## 2022-03-18 ENCOUNTER — Telehealth: Payer: Self-pay

## 2022-03-18 NOTE — Progress Notes (Signed)
Care Coordination Note  03/18/2022 Name: Teresa Holloway MRN: [id number] DOB: [birth year]  ARNITRA LABRAKE is a 81 y.o. year old female who is a primary care patient of Bennie Pierini, FNP and is actively engaged with the care management team. I reached out to [name] by phone today to assist with re-scheduling an initial visit with the Pharmacist  Follow up plan: Unsuccessful telephone outreach attempt made. A HIPAA compliant phone message was left for the patient providing contact information and requesting a return call.  The care management team will reach out to the patient again over the next 7 days.  If patient returns call to provider office, please advise to call CCM Care Guide Penne Lash  at [phone]  Penne Lash, RMA Care Guide [facility]  [place], [place] [address] Direct Dial: [phone] [email or web address]

## 2022-04-02 ENCOUNTER — Ambulatory Visit: Payer: Medicare Other | Admitting: Pharmacist

## 2022-04-19 ENCOUNTER — Other Ambulatory Visit: Payer: Self-pay | Admitting: Nurse Practitioner

## 2022-04-19 DIAGNOSIS — K219 Gastro-esophageal reflux disease without esophagitis: Secondary | ICD-10-CM

## 2022-04-30 ENCOUNTER — Ambulatory Visit: Payer: Medicare Other | Admitting: Nurse Practitioner

## 2022-05-29 ENCOUNTER — Other Ambulatory Visit: Payer: Self-pay | Admitting: Nurse Practitioner

## 2022-05-29 DIAGNOSIS — M1611 Unilateral primary osteoarthritis, right hip: Secondary | ICD-10-CM

## 2022-05-29 NOTE — Telephone Encounter (Signed)
Pt has appt 06/11/22-ok to refill or does pt ntbs

## 2022-06-06 ENCOUNTER — Ambulatory Visit (INDEPENDENT_AMBULATORY_CARE_PROVIDER_SITE_OTHER): Payer: Medicare Other | Admitting: Nurse Practitioner

## 2022-06-06 ENCOUNTER — Encounter: Payer: Self-pay | Admitting: Nurse Practitioner

## 2022-06-06 VITALS — BP 144/73 | HR 52 | Temp 97.4°F | Resp 20 | Ht 65.0 in | Wt 138.0 lb

## 2022-06-06 DIAGNOSIS — R1030 Lower abdominal pain, unspecified: Secondary | ICD-10-CM | POA: Diagnosis not present

## 2022-06-06 DIAGNOSIS — M1611 Unilateral primary osteoarthritis, right hip: Secondary | ICD-10-CM | POA: Diagnosis not present

## 2022-06-06 MED ORDER — TRAMADOL HCL 50 MG PO TABS: 50.0000 mg | ORAL_TABLET | Freq: Two times a day (BID) | ORAL | 2 refills | Status: DC

## 2022-06-06 NOTE — Patient Instructions (Signed)

## 2022-06-06 NOTE — Addendum Note (Signed)
Addended by: Chevis Pretty on: 06/06/2022 10:35 AM   Modules accepted: Orders

## 2022-06-06 NOTE — Progress Notes (Addendum)
Subjective:    Patient ID: Teresa Holloway, female    DOB: 05/18/1940, 82 y.o.   MRN: [id number]   Chief Complaint: Abdominal Pain (Difficulty with bowel movements and really dark/)   Abdominal Pain This is a new problem. The problem has been waxing and waning. The pain is located in the suprapubic region. The pain is at a severity of 5/10. The pain is moderate. The quality of the pain is cramping. Pertinent negatives include no belching, constipation or headaches. She has tried nothing for the symptoms. Her past medical history is significant for PUD. history of h pylori    Pain assessment: Cause of pain- leg pain Pain location- both legs- needs knee replacement Pain on scale of 1-10- 5/10 Frequency- daily What increases pain-standing and walking What makes pain Better-rest helps Effects on ADL - none Any change in general medical condition-no  Current opioids rx- ultram BID # meds rx- 60 Effectiveness of current meds-helsp Adverse reactions from pain meds-none Morphine equivalent-  Pill count performed-No Last drug screen - 09/04/21  ( high risk q3m, moderate risk q42m, low risk yearly ) Urine drug screen today- No Was the NCCSR reviewed- yes  If yes were their any concerning findings? - no   Overdose risk: 1    Pain contract signed on: 12/12/21     Review of Systems  Constitutional:  Negative for diaphoresis.  Eyes:  Negative for pain.  Respiratory:  Negative for shortness of breath.   Cardiovascular:  Negative for chest pain, palpitations and leg swelling.  Gastrointestinal:  Positive for abdominal pain. Negative for constipation.  Endocrine: Negative for polydipsia.  Skin:  Negative for rash.  Neurological:  Negative for dizziness, weakness and headaches.  Hematological:  Does not bruise/bleed easily.  All other systems reviewed and are negative.      Objective:   Physical Exam Vitals reviewed.  Constitutional:      Appearance: She is  well-developed.  Cardiovascular:     Rate and Rhythm: Normal rate and regular rhythm.  Pulmonary:     Breath sounds: Normal breath sounds.  Abdominal:     General: Abdomen is flat.     Palpations: Abdomen is soft.  Skin:    General: Skin is warm.  Neurological:     General: No focal deficit present.     Mental Status: She is alert and oriented to person, place, and time.  Psychiatric:        Mood and Affect: Mood normal.        Behavior: Behavior normal.     BP (!) 144/73   Pulse (!) 52   Temp (!) 97.4 F (36.3 C) (Temporal)   Resp 20   Ht 5\' 5"  (1.651 m)   Wt 138 lb (62.6 kg)   SpO2 95%   BMI 22.96 kg/m        Assessment & Plan:   Teresa Holloway in today with chief complaint of Abdominal Pain (Difficulty with bowel movements and really dark/)   1. Lower abdominal pain Stool specimen needed Take dulcolax to see if helps Force fluids Increase fiber in diet Will talk once stool specimen results are back - H. pylori antigen, stool   2. Pain management Meds ordered this encounter  Medications   traMADol (ULTRAM) 50 MG tablet    Sig: Take 1 tablet (50 mg total) by mouth 2 (two) times daily.    Dispense:  60 tablet    Refill:  2  Order Specific Question:   Supervising Provider    Answer:   Nils Pyle [[id number]]     The above assessment and management plan was discussed with the patient. The patient verbalized understanding of and has agreed to the management plan. Patient is aware to call the clinic if symptoms persist or worsen. Patient is aware when to return to the clinic for a follow-up visit. Patient educated on when it is appropriate to go to the emergency department.   Mary-Margaret Daphine Deutscher, FNP

## 2022-06-07 ENCOUNTER — Other Ambulatory Visit: Payer: Medicare Other

## 2022-06-07 DIAGNOSIS — R1030 Lower abdominal pain, unspecified: Secondary | ICD-10-CM | POA: Diagnosis not present

## 2022-06-09 LAB — H. PYLORI ANTIGEN, STOOL: H pylori Ag, Stl: NEGATIVE

## 2022-06-11 ENCOUNTER — Ambulatory Visit (INDEPENDENT_AMBULATORY_CARE_PROVIDER_SITE_OTHER): Payer: Medicare Other | Admitting: Nurse Practitioner

## 2022-06-11 ENCOUNTER — Ambulatory Visit (INDEPENDENT_AMBULATORY_CARE_PROVIDER_SITE_OTHER): Payer: Medicare Other

## 2022-06-11 ENCOUNTER — Encounter: Payer: Self-pay | Admitting: Nurse Practitioner

## 2022-06-11 VITALS — BP 139/70 | HR 61 | Temp 97.3°F | Resp 20 | Ht 65.0 in | Wt 141.0 lb

## 2022-06-11 DIAGNOSIS — M25561 Pain in right knee: Secondary | ICD-10-CM | POA: Diagnosis not present

## 2022-06-11 DIAGNOSIS — Z96651 Presence of right artificial knee joint: Secondary | ICD-10-CM | POA: Diagnosis not present

## 2022-06-11 DIAGNOSIS — M810 Age-related osteoporosis without current pathological fracture: Secondary | ICD-10-CM

## 2022-06-11 DIAGNOSIS — K2970 Gastritis, unspecified, without bleeding: Secondary | ICD-10-CM | POA: Diagnosis not present

## 2022-06-11 DIAGNOSIS — K5900 Constipation, unspecified: Secondary | ICD-10-CM

## 2022-06-11 DIAGNOSIS — D649 Anemia, unspecified: Secondary | ICD-10-CM | POA: Diagnosis not present

## 2022-06-11 DIAGNOSIS — K219 Gastro-esophageal reflux disease without esophagitis: Secondary | ICD-10-CM

## 2022-06-11 DIAGNOSIS — E785 Hyperlipidemia, unspecified: Secondary | ICD-10-CM

## 2022-06-11 DIAGNOSIS — M1711 Unilateral primary osteoarthritis, right knee: Secondary | ICD-10-CM | POA: Diagnosis not present

## 2022-06-11 DIAGNOSIS — Z0000 Encounter for general adult medical examination without abnormal findings: Secondary | ICD-10-CM

## 2022-06-11 DIAGNOSIS — I25111 Atherosclerotic heart disease of native coronary artery with angina pectoris with documented spasm: Secondary | ICD-10-CM

## 2022-06-11 DIAGNOSIS — Z0001 Encounter for general adult medical examination with abnormal findings: Secondary | ICD-10-CM | POA: Diagnosis not present

## 2022-06-11 DIAGNOSIS — B9681 Helicobacter pylori [H. pylori] as the cause of diseases classified elsewhere: Secondary | ICD-10-CM

## 2022-06-11 DIAGNOSIS — I10 Essential (primary) hypertension: Secondary | ICD-10-CM | POA: Diagnosis not present

## 2022-06-11 DIAGNOSIS — Z6830 Body mass index (BMI) 30.0-30.9, adult: Secondary | ICD-10-CM

## 2022-06-11 DIAGNOSIS — F3342 Major depressive disorder, recurrent, in full remission: Secondary | ICD-10-CM

## 2022-06-11 NOTE — Progress Notes (Signed)
Subjective:    Patient ID: Teresa Holloway, female    DOB: 21-Sep-1940, 82 y.o.   MRN: [id number]   Chief Complaint: annual physical    HPI:  Teresa Holloway is a 82 y.o. who identifies as a female who was assigned female at birth.   Social history: Lives with: by herself-her husband passed away in the last year Work history: retired   Water engineer in today for follow up of the following chronic medical issues:  1. Essential hypertension, benign No c/o chest pain, sob or headache. Does not check blood pressure at home. BP Readings from Last 3 Encounters:  06/06/22 (!) 144/73  03/11/22 122/64  03/05/22 126/68     2. Hyperlipidemia with target LDL less than 100 Does not watch diet and does no exercise due to chronic hip pain. Lab Results  Component Value Date   CHOL 124 09/04/2021   HDL 74 09/04/2021   LDLCALC 35 09/04/2021   TRIG 78 09/04/2021   CHOLHDL 1.7 09/04/2021     3. Coronary artery disease involving native coronary artery of native heart with angina pectoris with documented spasm [facility]) Last saw cardiology 02/26/22. Review of office note - no change in plan of care  4. Gastroesophageal reflux disease, unspecified whether esophagitis present Uses OTC meds when needed  5. Helicobacter pylori gastritis Had some gastritis last week and had stool hpylori test which came back negative. She denies any constipation or diarrhea.  6. Anemia, unspecified type No fatigue.  Lab Results  Component Value Date   HGB 11.4 02/11/2022     7. Constipation, unspecified constipation type Doing well right now  8. Recurrent major depressive disorder, in full remission (HCC) Is on lexapro and is doing well    06/11/2022   10:18 AM 06/06/2022   10:10 AM 03/11/2022   11:38 AM 03/05/2022    2:06 PM  GAD 7 : Generalized Anxiety Score  Nervous, Anxious, on Edge 0 0 0 0  Control/stop worrying 0 0 0 1  Worry too much - different things 0 0 0 1  Trouble relaxing 0 0 0 0   Restless 0 0 0 0  Easily annoyed or irritable 0 0 0 0  Afraid - awful might happen 0 0 0 0  Total GAD 7 Score 0 0 0 2  Anxiety Difficulty Not difficult at all Not difficult at all Not difficult at all Somewhat difficult      9. Age related osteoporosis, unspecified pathological fracture presence// Last dexascan was done on 12/04/21. T score was -2.5. she doe sno weight bearing exercises due to her chronic hip pain.  10. BMI 30.0-30.9,adult No recent weight changes Wt Readings from Last 3 Encounters:  06/11/22 141 lb (64 kg)  06/06/22 138 lb (62.6 kg)  03/11/22 140 lb (63.5 kg)   BMI Readings from Last 3 Encounters:  06/11/22 23.46 kg/m  06/06/22 22.96 kg/m  03/11/22 23.30 kg/m     New complaints: Right knee pain- has gradually been worsening- she uses OTC topical cream that helpa. Rates pain 7-8/10 currently  Allergies  Allergen Reactions   Atarax [Hydroxyzine] Nausea And Vomiting   Bactrim [Sulfamethoxazole-Trimethoprim] Nausea Only   Coreg [Carvedilol] Other (See Comments)    Unknown   Outpatient Encounter Medications as of 06/11/2022  Medication Sig   amLODipine (NORVASC) 5 MG tablet Take 1 tablet (5 mg total) by mouth daily.   aspirin EC 81 MG tablet Take 1 tablet (81 mg total) by mouth 2 (two)  times daily. (Patient taking differently: Take 81 mg by mouth daily as needed.)   atorvastatin (LIPITOR) 40 MG tablet TAKE 1 TABLET BY MOUTH DAILY AT 6 PM.   benazepril (LOTENSIN) 40 MG tablet Take 1 tablet (40 mg total) by mouth daily.   cholecalciferol (VITAMIN D) 1000 UNITS tablet Take 1,000 Units by mouth daily.   diclofenac sodium (VOLTAREN) 1 % GEL Apply 2 g topically 4 (four) times daily. (Patient taking differently: Apply 2 g topically 4 (four) times daily as needed (pain).)   escitalopram (LEXAPRO) 20 MG tablet Take 1 tablet (20 mg total) by mouth daily.   hydrochlorothiazide (HYDRODIURIL) 25 MG tablet Take 1 tablet (25 mg total) by mouth daily.   isosorbide  mononitrate (IMDUR) 60 MG 24 hr tablet TAKE 1 TABLET BY MOUTH EVERYDAY AT BEDTIME   latanoprost (XALATAN) 0.005 % ophthalmic solution 1 drop at bedtime.   metoprolol succinate (TOPROL-XL) 25 MG 24 hr tablet Take 1 tablet (25 mg total) by mouth daily.   nitroGLYCERIN (NITROSTAT) 0.4 MG SL tablet Place 1 tablet (0.4 mg total) under the tongue every 5 (five) minutes as needed for chest pain.   polyethylene glycol powder (GLYCOLAX/MIRALAX) 17 GM/SCOOP powder Take by mouth. 17 grams oral daily for constipation.   potassium chloride (KLOR-CON M10) 10 MEQ tablet Take 1 tablet (10 mEq total) by mouth 2 (two) times daily.   traMADol (ULTRAM) 50 MG tablet Take 1 tablet (50 mg total) by mouth 2 (two) times daily.   No facility-administered encounter medications on file as of 06/11/2022.    Past Surgical History:  Procedure Laterality Date   BIOPSY  07/16/2021   Procedure: BIOPSY;  Surgeon: Lanelle Bal, DO;  [facility]: [facility];  Service: Endoscopy;;   COLONOSCOPY WITH PROPOFOL N/A 07/16/2021   Procedure: COLONOSCOPY WITH PROPOFOL;  Surgeon: Lanelle Bal, DO;  Location: [facility];  Service: Endoscopy;  Laterality: N/A;  10:30am   ECTOPIC PREGNANCY SURGERY     ESOPHAGOGASTRODUODENOSCOPY (EGD) WITH PROPOFOL N/A 07/16/2021   Procedure: ESOPHAGOGASTRODUODENOSCOPY (EGD) WITH PROPOFOL;  Surgeon: Lanelle Bal, DO;  Location: [facility];  Service: Endoscopy;  Laterality: N/A;   left breast biospy     left wrist fracture     TOTAL KNEE ARTHROPLASTY Left 01/25/2019   Procedure: LEFT TOTAL KNEE ARTHROPLASTY;  Surgeon: Gean Birchwood, MD;  Location: [facility];  Service: Orthopedics;  Laterality: Left;    Family History  Problem Relation Age of Onset   Asthma Father    Diabetes Sister    Diabetes Brother       Controlled substance contract: n/a     Review of Systems  Constitutional:  Negative for diaphoresis.  Eyes:  Negative for pain.  Respiratory:  Negative for shortness of  breath.   Cardiovascular:  Negative for chest pain, palpitations and leg swelling.  Gastrointestinal:  Negative for abdominal pain.  Endocrine: Negative for polydipsia.  Skin:  Negative for rash.  Neurological:  Negative for dizziness, weakness and headaches.  Hematological:  Does not bruise/bleed easily.  All other systems reviewed and are negative.      Objective:   Physical Exam Vitals and nursing note reviewed.  Constitutional:      General: She is not in acute distress.    Appearance: Normal appearance. She is well-developed.  HENT:     Head: Normocephalic.     Right Ear: Tympanic membrane normal.     Left Ear: Tympanic membrane normal.     Nose: Nose  normal.     Mouth/Throat:     Mouth: Mucous membranes are moist.  Eyes:     Pupils: Pupils are equal, round, and reactive to light.  Neck:     Vascular: No carotid bruit or JVD.  Cardiovascular:     Rate and Rhythm: Normal rate and regular rhythm.     Heart sounds: Normal heart sounds.  Pulmonary:     Effort: Pulmonary effort is normal. No respiratory distress.     Breath sounds: Normal breath sounds. No wheezing or rales.  Chest:     Chest wall: No tenderness.  Abdominal:     General: Bowel sounds are normal. There is no distension or abdominal bruit.     Palpations: Abdomen is soft. There is no hepatomegaly, splenomegaly, mass or pulsatile mass.     Tenderness: There is no abdominal tenderness.  Musculoskeletal:        General: Normal range of motion.     Cervical back: Normal range of motion and neck supple.     Comments: Mild right knee effusion FROM with crepitus on flexion and extension Walks with limp on right side  Lymphadenopathy:     Cervical: No cervical adenopathy.  Skin:    General: Skin is warm and dry.  Neurological:     Mental Status: She is alert and oriented to person, place, and time.     Deep Tendon Reflexes: Reflexes are normal and symmetric.  Psychiatric:        Behavior: Behavior normal.         Thought Content: Thought content normal.        Judgment: Judgment normal.    BP 139/70   Pulse 61   Temp (!) 97.3 F (36.3 C) (Temporal)   Resp 20   Ht 5\' 5"  (1.651 m)   Wt 141 lb (64 kg)   SpO2 93%   BMI 23.46 kg/m    Right knee xray- mild osteoarthritis-Preliminary reading by Paulene Floor, FNP  [facility]      Assessment & Plan:   Teresa Holloway comes in today with chief complaint of Annual Exam   Diagnosis and orders addressed:  1. Annual physical exam  2. Essential hypertension, benign Low sodium diet  3. Hyperlipidemia with target LDL less than 100 Low fat diet  4. Coronary artery disease involving native coronary artery of native heart with angina pectoris with documented spasm (HCC) Keep year;y follow up with cardiology  5. Gastroesophageal reflux disease, unspecified whether esophagitis present Avoid spicy foods Do not eat 2 hours prior to bedtime  6. Helicobacter pylori gastritis negative  7. Anemia, unspecified type Labs pending  8. Constipation, unspecified constipation type  9. Recurrent major depressive disorder, in full remission (HCC) Stress management  10. Age related osteoporosis, unspecified pathological fracture presence Cannot tolerate weight bearing exercise currently  11. BMI 30.0-30.9,adult Discussed diet and exercise for person with BMI >25 Will recheck weight in 3-6 months   12. Acute pain of right knee Patient will let me know if wants referral - DG Knee 1-2 Views Right   Labs pending Health Maintenance reviewed Diet and exercise encouraged  Follow up plan: 3 months   Mary-Margaret Daphine Deutscher, FNP

## 2022-06-11 NOTE — Patient Instructions (Signed)

## 2022-07-01 ENCOUNTER — Emergency Department (HOSPITAL_COMMUNITY): Payer: Medicare Other

## 2022-07-01 ENCOUNTER — Emergency Department (HOSPITAL_COMMUNITY)
Admission: EM | Admit: 2022-07-01 | Discharge: 2022-07-01 | Disposition: A | Discharge status: Home / Self Care | Payer: Medicare Other | Attending: Emergency Medicine | Admitting: Emergency Medicine

## 2022-07-01 ENCOUNTER — Other Ambulatory Visit: Payer: Self-pay

## 2022-07-01 DIAGNOSIS — I2510 Atherosclerotic heart disease of native coronary artery without angina pectoris: Secondary | ICD-10-CM | POA: Diagnosis not present

## 2022-07-01 DIAGNOSIS — M40204 Unspecified kyphosis, thoracic region: Secondary | ICD-10-CM

## 2022-07-01 DIAGNOSIS — Z7982 Long term (current) use of aspirin: Secondary | ICD-10-CM | POA: Insufficient documentation

## 2022-07-01 DIAGNOSIS — E876 Hypokalemia: Secondary | ICD-10-CM | POA: Diagnosis not present

## 2022-07-01 DIAGNOSIS — R0789 Other chest pain: Secondary | ICD-10-CM | POA: Diagnosis not present

## 2022-07-01 DIAGNOSIS — R079 Chest pain, unspecified: Secondary | ICD-10-CM | POA: Diagnosis not present

## 2022-07-01 DIAGNOSIS — I10 Essential (primary) hypertension: Secondary | ICD-10-CM | POA: Insufficient documentation

## 2022-07-01 DIAGNOSIS — Z79899 Other long term (current) drug therapy: Secondary | ICD-10-CM | POA: Diagnosis not present

## 2022-07-01 LAB — CBC
HCT: 35.1 % — ABNORMAL LOW (ref 36.0–46.0)
Hemoglobin: 11.5 g/dL — ABNORMAL LOW (ref 12.0–15.0)
MCH: 30.7 pg (ref 26.0–34.0)
MCHC: 32.8 g/dL (ref 30.0–36.0)
MCV: 93.9 fL (ref 80.0–100.0)
Platelets: 195 10*3/uL (ref 150–400)
RBC: 3.74 MIL/uL — ABNORMAL LOW (ref 3.87–5.11)
RDW: 13.5 % (ref 11.5–15.5)
WBC: 5 10*3/uL (ref 4.0–10.5)
nRBC: 0 % (ref 0.0–0.2)

## 2022-07-01 LAB — BASIC METABOLIC PANEL
Anion gap: 7 (ref 5–15)
BUN: 15 mg/dL (ref 8–23)
CO2: 30 mmol/L (ref 22–32)
Calcium: 9.4 mg/dL (ref 8.9–10.3)
Chloride: 100 mmol/L (ref 98–111)
Creatinine, Ser: 0.98 mg/dL (ref 0.44–1.00)
GFR, Estimated: 58 mL/min — ABNORMAL LOW (ref >60)
Glucose, Bld: 119 mg/dL — ABNORMAL HIGH (ref 70–99)
Potassium: 3.1 mmol/L — ABNORMAL LOW (ref 3.5–5.1)
Sodium: 137 mmol/L (ref 135–145)

## 2022-07-01 LAB — D-DIMER, QUANTITATIVE: D-Dimer, Quant: 1.24 ug/mL-FEU — ABNORMAL HIGH (ref 0.00–0.50)

## 2022-07-01 LAB — TROPONIN I (HIGH SENSITIVITY)
Troponin I (High Sensitivity): 11 ng/L (ref <18)
Troponin I (High Sensitivity): 11 ng/L (ref <18)

## 2022-07-01 LAB — MAGNESIUM: Magnesium: 1.7 mg/dL (ref 1.7–2.4)

## 2022-07-01 MED ORDER — IOHEXOL 350 MG/ML SOLN
75.0000 mL | Freq: Once | INTRAVENOUS | Status: AC | PRN
  Administered 2022-07-01: 75 mL via INTRAVENOUS

## 2022-07-01 MED ORDER — ACETAMINOPHEN 500 MG PO TABS
1000.0000 mg | ORAL_TABLET | Freq: Once | ORAL | Status: AC
  Administered 2022-07-01: 1000 mg via ORAL
  Filled 2022-07-01: qty 2

## 2022-07-01 MED ORDER — POTASSIUM CHLORIDE CRYS ER 20 MEQ PO TBCR
40.0000 meq | EXTENDED_RELEASE_TABLET | Freq: Once | ORAL | Status: AC
  Administered 2022-07-01: 40 meq via ORAL
  Filled 2022-07-01: qty 2

## 2022-07-01 NOTE — ED Notes (Signed)
Pt  up in room.  Dressed.  Awaiting DC papers.

## 2022-07-01 NOTE — ED Notes (Signed)
Pt watching TV  no distress.  States she wants to go home

## 2022-07-01 NOTE — ED Triage Notes (Signed)
Pt states se woke up with left sided chest pain that doesn't improve or worsen with movement.  Denies N/V/D, SOB.  Is not sure if she took an aspirin this morning or not.

## 2022-07-01 NOTE — ED Provider Notes (Signed)
[facility] Provider Note   CSN: [id number] Arrival date & time: 07/01/22  [id number]     History  Chief Complaint  Patient presents with   Chest Pain    Teresa Holloway is a 82 y.o. female.  Pt is a 82 yo female with pmhx significant for HTN, CAD, HLD, GERD, OA, depression and anxiety.  Pt said she woke up this am with left sided cp.  Pain a little worse with deep breath.  No associated n/v or sob.  No f/c.  No cough.  No pain upon palpation.       Home Medications Prior to Admission medications   Medication Sig Start Date End Date Taking? Authorizing Provider  amLODipine (NORVASC) 5 MG tablet Take 1 tablet (5 mg total) by mouth daily. 03/11/22  Yes Hassell Done, Mary-Margaret, FNP  aspirin EC 81 MG tablet Take 1 tablet (81 mg total) by mouth 2 (two) times daily. Patient taking differently: Take 81 mg by mouth daily as needed. 01/25/19  Yes Joanell Rising K, PA-C  atorvastatin (LIPITOR) 40 MG tablet TAKE 1 TABLET BY MOUTH DAILY AT 6 PM. 03/11/22  Yes Hassell Done, Mary-Margaret, FNP  benazepril (LOTENSIN) 40 MG tablet Take 1 tablet (40 mg total) by mouth daily. 03/11/22  Yes Martin, Mary-Margaret, FNP  cholecalciferol (VITAMIN D) 1000 UNITS tablet Take 1,000 Units by mouth daily.   Yes [provider]  diclofenac sodium (VOLTAREN) 1 % GEL Apply 2 g topically 4 (four) times daily. Patient taking differently: Apply 2 g topically 4 (four) times daily as needed (pain). 06/30/18  Yes Martin, Mary-Margaret, FNP  escitalopram (LEXAPRO) 20 MG tablet Take 1 tablet (20 mg total) by mouth daily. 03/11/22  Yes Martin, Mary-Margaret, FNP  hydrochlorothiazide (HYDRODIURIL) 25 MG tablet Take 1 tablet (25 mg total) by mouth daily. 03/11/22  Yes Hassell Done, Mary-Margaret, FNP  isosorbide mononitrate (IMDUR) 60 MG 24 hr tablet TAKE 1 TABLET BY MOUTH EVERYDAY AT BEDTIME 03/11/22  Yes Martin, Mary-Margaret, FNP  latanoprost (XALATAN) 0.005 % ophthalmic solution 1  drop at bedtime. 08/24/21  Yes [provider]  metoprolol succinate (TOPROL-XL) 25 MG 24 hr tablet Take 1 tablet (25 mg total) by mouth daily. 03/11/22 03/11/23 Yes Martin, Mary-Margaret, FNP  nitroGLYCERIN (NITROSTAT) 0.4 MG SL tablet Place 1 tablet (0.4 mg total) under the tongue every 5 (five) minutes as needed for chest pain. 09/24/18  Yes Martin, Mary-Margaret, FNP  polyethylene glycol powder (GLYCOLAX/MIRALAX) 17 GM/SCOOP powder Take by mouth. 17 grams oral daily for constipation.   Yes [provider]  potassium chloride (KLOR-CON M10) 10 MEQ tablet Take 1 tablet (10 mEq total) by mouth 2 (two) times daily. 03/11/22  Yes Hassell Done, Mary-Margaret, FNP  traMADol (ULTRAM) 50 MG tablet Take 1 tablet (50 mg total) by mouth 2 (two) times daily. 06/06/22 06/06/23 Yes Hassell Done, Mary-Margaret, FNP      Allergies    Atarax [hydroxyzine], Bactrim [sulfamethoxazole-trimethoprim], and Coreg [carvedilol]    Review of Systems   Review of Systems  Cardiovascular:  Positive for chest pain.  All other systems reviewed and are negative.   Physical Exam Updated Vital Signs BP 126/65   Pulse (!) 52   Temp 98.1 F (36.7 C) (Oral)   Resp 15   Ht '5\' 5"'$  (1.651 m)   Wt 61.2 kg   SpO2 95%   BMI 22.47 kg/m  Physical Exam Vitals and nursing note reviewed.  Constitutional:      Appearance: She is well-developed.  HENT:     Head: Normocephalic and atraumatic.  Eyes:     Extraocular Movements: Extraocular movements intact.     Pupils: Pupils are equal, round, and reactive to light.  Cardiovascular:     Rate and Rhythm: Normal rate and regular rhythm.     Heart sounds: Normal heart sounds.  Pulmonary:     Effort: Pulmonary effort is normal.     Breath sounds: Normal breath sounds.  Abdominal:     General: Bowel sounds are normal.     Palpations: Abdomen is soft.  Musculoskeletal:        General: Normal range of motion.     Cervical back: Normal range of motion and neck supple.   Skin:    General: Skin is warm.     Capillary Refill: Capillary refill takes less than 2 seconds.  Neurological:     General: No focal deficit present.     Mental Status: She is alert and oriented to person, place, and time.  Psychiatric:        Mood and Affect: Mood normal.        Behavior: Behavior normal.     ED Results / Procedures / Treatments   Labs (all labs ordered are listed, but only abnormal results are displayed) Labs Reviewed  BASIC METABOLIC PANEL - Abnormal; Notable for the following components:      Result Value   Potassium 3.1 (*)    Glucose, Bld 119 (*)    GFR, Estimated 58 (*)    All other components within normal limits  CBC - Abnormal; Notable for the following components:   RBC 3.74 (*)    Hemoglobin 11.5 (*)    HCT 35.1 (*)    All other components within normal limits  D-DIMER, QUANTITATIVE - Abnormal; Notable for the following components:   D-Dimer, Quant 1.24 (*)    All other components within normal limits  MAGNESIUM  TROPONIN I (HIGH SENSITIVITY)  TROPONIN I (HIGH SENSITIVITY)    EKG EKG Interpretation  Date/Time:  Monday July 01 2022 09:48:12 EDT Ventricular Rate:  54 PR Interval:  193 QRS Duration: 122 QT Interval:  534 QTC Calculation: 507 R Axis:   6 Text Interpretation: Sinus rhythm Nonspecific intraventricular conduction delay Borderline T abnormalities, anterior leads QRS has lengthened Confirmed by Isla Pence [phone]) on 07/01/2022 10:05:01 AM  Radiology DG Chest Port 1 View  Result Date: 07/01/2022 CLINICAL DATA:  Left-sided chest pain EXAM: PORTABLE CHEST 1 VIEW COMPARISON:  02/28/2022 FINDINGS: Cardiac shadow is stable. Aortic calcifications are again noted. The lungs are well aerated bilaterally. No focal infiltrate or sizable effusion is seen. No acute bony abnormality is noted. Stable scoliosis is seen. IMPRESSION: No active disease. Electronically Signed   By: Inez Catalina M.D.   On: 07/01/2022 10:16     Procedures Procedures    Medications Ordered in ED Medications  potassium chloride SA (KLOR-CON M) CR tablet 40 mEq (40 mEq Oral Given 07/01/22 1044)  acetaminophen (TYLENOL) tablet 1,000 mg (1,000 mg Oral Given 07/01/22 1217)  iohexol (OMNIPAQUE) 350 MG/ML injection 75 mL (75 mLs Intravenous Contrast Given 07/01/22 1136)    ED Course/ Medical Decision Making/ A&P                             Medical Decision Making Amount and/or Complexity of Data Reviewed Labs: ordered. Radiology: ordered.  Risk OTC drugs. Prescription drug management.   This patient presents to  the ED for concern of CP, this involves an extensive number of treatment options, and is a complaint that carries with it a high risk of complications and morbidity.  The differential diagnosis includes cardiac, pulm, GI, msk   Co morbidities that complicate the patient evaluation  HTN, CAD, HLD, GERD, OA, depression and anxiety   Additional history obtained:  Additional history obtained from epic chart review External records from outside source obtained and reviewed including family   Lab Tests:  I Ordered, and personally interpreted labs.  The pertinent results include:  CBC with hgb 11.5 (stable), DDimer elevated at 1.24; bmp nl other than k low at 3.1, trop nl   Imaging Studies ordered:  I ordered imaging studies including cxr and ct chest  I independently visualized and interpreted imaging which showed  CXR: No active disease.  CT chest: 1. No evidence for acute pulmonary embolism. 2. Mild cardiac enlargement. 3. Kyphosis deformity within the upper thoracic spine with multilevel degenerative disc disease. 4. No signs of interstitial edema or pneumonia. 5. No suspicious lung nodule or mass identified. 6. Aortic Atherosclerosis (ICD10-I70.0).  I agree with the radiologist interpretation   Cardiac Monitoring:  The patient was maintained on a cardiac monitor.  I personally viewed and  interpreted the cardiac monitored which showed an underlying rhythm of: nsr   Medicines ordered and prescription drug management:  I ordered medication including tylenol  for pain  Reevaluation of the patient after these medicines showed that the patient improved I have reviewed the patients home medicines and have made adjustments as needed   Test Considered:  ct   Critical Interventions:  ct  Problem List / ED Course:  CP: atypical.  Cardiac eval neg.  CT chest neg for PE or anything acute. Hypokalemia:  pt given 40 meq KDur   Reevaluation:  After the interventions noted above, I reevaluated the patient and found that they have :improved   Social Determinants of Health:  Lives at home   Dispostion:  After consideration of the diagnostic results and the patients response to treatment, I feel that the patent would benefit from discharge with outpatient f/u.          Final Clinical Impression(s) / ED Diagnoses Final diagnoses:  Hypokalemia  Atypical chest pain  Kyphosis of thoracic region, unspecified kyphosis type    Rx / DC Orders ED Discharge Orders     None         Isla Pence, MD 07/01/22 1411

## 2022-07-04 ENCOUNTER — Telehealth: Payer: Self-pay | Admitting: *Deleted

## 2022-07-04 NOTE — Transitions of Care (Post Inpatient/ED Visit) (Signed)
07/04/2022  Name: GWYN BUSCHMANN MRN: [id number] DOB: 1940/07/27  Today's TOC FU Call Status: Today's TOC FU Call Status:: Unsuccessul Call (1st Attempt) Unsuccessful Call (1st Attempt) Date: 07/04/22  Attempted to reach the patient regarding the most recent Inpatient/ED visit.  Follow Up Plan: Additional outreach attempts will be made to reach the patient to complete the Transitions of Care (Post Inpatient/ED visit) call.   Gean Maidens BSN RN [facility] Care Management [phone]

## 2022-07-05 ENCOUNTER — Telehealth: Payer: Self-pay | Admitting: *Deleted

## 2022-07-05 NOTE — Transitions of Care (Post Inpatient/ED Visit) (Signed)
07/05/2022  Name: Teresa Holloway MRN: [id number] DOB: 05/06/1940  Today's TOC FU Call Status: Today's TOC FU Call Status:: Unsuccessful Call (2nd Attempt) Unsuccessful Call (2nd Attempt) Date: 07/05/22  Attempted to reach the patient regarding the most recent Inpatient/ED visit. ED EMMI Alert for "not having discharge papers" and for "not having a follow-up appt".  Follow Up Plan: Additional outreach attempts will be made to reach the patient to complete the Transitions of Care (Post Inpatient/ED visit) call.   Demetrios Loll, BSN, RN-BC RN Care Coordinator [facility]  [facility] Direct Dial: [phone] Main #: [phone]

## 2022-07-09 ENCOUNTER — Telehealth: Payer: Self-pay

## 2022-07-09 NOTE — Transitions of Care (Post Inpatient/ED Visit) (Signed)
07/09/2022  Name: Teresa Holloway MRN: [id number] DOB: December 12, 1940  Today's TOC FU Call Status: Today's TOC FU Call Status:: Unsuccessful Call (3rd Attempt) Unsuccessful Call (3rd Attempt) Date: 07/09/22  Attempted to reach the patient regarding the most recent Inpatient/ED visit.  Red on EMMI-ED Discharge Alert Date & Reason: 07/03/22 "Scheduled follow-up appt? No" "Have d/c instructions? No"  Follow Up Plan: No further outreach attempts will be made at this time. We have been unable to contact the patient.  Antionette Fairy, RN,BSN,CCM [facility] Care Management Care Management Community Coordinator Direct Phone: [phone] Toll Free: [phone] Fax: [phone]

## 2022-07-31 ENCOUNTER — Telehealth: Payer: Self-pay | Admitting: Nurse Practitioner

## 2022-07-31 NOTE — Telephone Encounter (Signed)
Called patient to schedule Medicare Annual Wellness Visit (AWV). Left message for patient to call back and schedule Medicare Annual Wellness Visit (AWV).  Last date of AWV: 07/04/2021   Please schedule an appointment at any time with either Laura or Courtney, NHA's. .  If any questions, please contact me at [phone].  Thank you,  Stephanie,  AMB Clinical Support [facility] AWV Program Direct Dial ??[phone]    

## 2022-07-31 NOTE — Telephone Encounter (Signed)
Contacted Teresa Holloway to schedule their annual wellness visit. Appointment made for 08/08/2022.  Thank you,  Judeth Cornfield,  AMB Clinical Support [facility] AWV Program Direct Dial ??[phone]

## 2022-08-06 NOTE — Progress Notes (Unsigned)
GI Office Note    Referring Provider: Bennie Pierini, * Primary Care Physician:  Bennie Pierini, FNP  Primary Gastroenterologist: [name]. [name], DO   Chief Complaint   No chief complaint on file.   History of Present Illness   Teresa Holloway is a 82 y.o. female presenting today for follow up. Last seen 01/2022. H/o   chronic constipation, H. pylori gastritis status posttreatment (confirmed eradication in July by stool antigen), anemia. Labs from July 2023 showed stable hemoglobin (10.4), B12 low normal, folate normal, iron normal. Recommended to take over-the-counter vitamin B12 1000 mcg daily. She is due for repeat labs at this time.    Colonoscopy March 2023: - Non-bleeding internal hemorrhoids. - The examination was otherwise normal. - No specimens collected.   EGD March 2023: - Z-line regular, 39 cm from the incisors. - Gastritis. Biopsied. - Normal duodenal bulb, first portion of the duodenum and second portion of the duodenum. - Small hiatal hernia. -Gastric biopsy showed H. pylori associated gastritis.  Patient treated with clarithromycin and amoxicillin along with pantoprazole 40 mg twice daily.    Medications   Current Outpatient Medications  Medication Sig Dispense Refill   amLODipine (NORVASC) 5 MG tablet Take 1 tablet (5 mg total) by mouth daily. 90 tablet 1   aspirin EC 81 MG tablet Take 1 tablet (81 mg total) by mouth 2 (two) times daily. (Patient taking differently: Take 81 mg by mouth daily as needed.) 60 tablet 0   atorvastatin (LIPITOR) 40 MG tablet TAKE 1 TABLET BY MOUTH DAILY AT 6 PM. 90 tablet 1   benazepril (LOTENSIN) 40 MG tablet Take 1 tablet (40 mg total) by mouth daily. 90 tablet 1   cholecalciferol (VITAMIN D) 1000 UNITS tablet Take 1,000 Units by mouth daily.     diclofenac sodium (VOLTAREN) 1 % GEL Apply 2 g topically 4 (four) times daily. (Patient taking differently: Apply 2 g topically 4 (four) times daily as needed  (pain).) 500 g 1   escitalopram (LEXAPRO) 20 MG tablet Take 1 tablet (20 mg total) by mouth daily. 90 tablet 1   hydrochlorothiazide (HYDRODIURIL) 25 MG tablet Take 1 tablet (25 mg total) by mouth daily. 90 tablet 1   isosorbide mononitrate (IMDUR) 60 MG 24 hr tablet TAKE 1 TABLET BY MOUTH EVERYDAY AT BEDTIME 90 tablet 1   latanoprost (XALATAN) 0.005 % ophthalmic solution 1 drop at bedtime.     metoprolol succinate (TOPROL-XL) 25 MG 24 hr tablet Take 1 tablet (25 mg total) by mouth daily. 30 tablet 11   nitroGLYCERIN (NITROSTAT) 0.4 MG SL tablet Place 1 tablet (0.4 mg total) under the tongue every 5 (five) minutes as needed for chest pain. 30 tablet 1   polyethylene glycol powder (GLYCOLAX/MIRALAX) 17 GM/SCOOP powder Take by mouth. 17 grams oral daily for constipation.     potassium chloride (KLOR-CON M10) 10 MEQ tablet Take 1 tablet (10 mEq total) by mouth 2 (two) times daily. 180 tablet 1   traMADol (ULTRAM) 50 MG tablet Take 1 tablet (50 mg total) by mouth 2 (two) times daily. 60 tablet 2   No current facility-administered medications for this visit.    Allergies   Allergies as of 08/07/2022 - Review Complete 07/01/2022  Allergen Reaction Noted   Atarax [hydroxyzine] Nausea And Vomiting 08/10/2012   Bactrim [sulfamethoxazole-trimethoprim] Nausea Only 03/23/2015   Coreg [carvedilol] Other (See Comments) 08/10/2012     Past Medical History   Past Medical History:  Diagnosis Date  Anxiety    Arthritis    Knees, hips   CAD (coronary artery disease)    Chronic back pain    Depression    GERD (gastroesophageal reflux disease)    Hyperlipidemia    Hypertension    MI (myocardial infarction) (HCC)    Osteopenia     Past Surgical History   Past Surgical History:  Procedure Laterality Date   BIOPSY  07/16/2021   Procedure: BIOPSY;  Surgeon: Lanelle Bal, DO;  Location: [facility];  Service: Endoscopy;;   COLONOSCOPY WITH PROPOFOL N/A 07/16/2021   Procedure: COLONOSCOPY  WITH PROPOFOL;  Surgeon: Lanelle Bal, DO;  Location: [facility];  Service: Endoscopy;  Laterality: N/A;  10:30am   ECTOPIC PREGNANCY SURGERY     ESOPHAGOGASTRODUODENOSCOPY (EGD) WITH PROPOFOL N/A 07/16/2021   Procedure: ESOPHAGOGASTRODUODENOSCOPY (EGD) WITH PROPOFOL;  Surgeon: Lanelle Bal, DO;  Location: [facility];  Service: Endoscopy;  Laterality: N/A;   left breast biospy     left wrist fracture     TOTAL KNEE ARTHROPLASTY Left 01/25/2019   Procedure: LEFT TOTAL KNEE ARTHROPLASTY;  Surgeon: Gean Birchwood, MD;  Location: [facility];  Service: Orthopedics;  Laterality: Left;    Past Family History   Family History  Problem Relation Age of Onset   Asthma Father    Diabetes Sister    Diabetes Brother     Past Social History   Social History   Socioeconomic History   Marital status: Widowed    Spouse name: [name]   Number of children: 1   Years of education: Not on file   Highest education level: 10th grade  Occupational History   Occupation: Retired  Tobacco Use   Smoking status: Never   Smokeless tobacco: Never  Vaping Use   Vaping Use: Never used  Substance and Sexual Activity   Alcohol use: No   Drug use: No   Sexual activity: Not Currently    Birth control/protection: Post-menopausal  Other Topics Concern   Not on file  Social History Narrative   Husband passed 12/2020.    1 son-[name].   2 grandchildren   Twin great grandchildren.   Social Determinants of Health   Financial Resource Strain: Low Risk  (07/04/2021)   Overall Financial Resource Strain (CARDIA)    Difficulty of Paying Living Expenses: Not hard at all  Food Insecurity: No Food Insecurity (07/04/2021)   Hunger Vital Sign    Worried About Running Out of Food in the Last Year: Never true    Ran Out of Food in the Last Year: Never true  Transportation Needs: No Transportation Needs (07/04/2021)   PRAPARE - Administrator, Civil Service (Medical): No    Lack of Transportation  (Non-Medical): No  Physical Activity: Insufficiently Active (07/04/2021)   Exercise Vital Sign    Days of Exercise per Week: 5 days    Minutes of Exercise per Session: 20 min  Stress: No Stress Concern Present (07/04/2021)   [employer] of Occupational Health - Occupational Stress Questionnaire    Feeling of Stress : Only a little  Social Connections: Moderately Isolated (07/04/2021)   Social Connection and Isolation Panel [NHANES]    Frequency of Communication with Friends and Family: More than three times a week    Frequency of Social Gatherings with Friends and Family: More than three times a week    Attends Religious Services: 1 to 4 times per year    Active Member of Clubs or  Organizations: No    Attends Banker Meetings: Never    Marital Status: Widowed  Intimate Partner Violence: Not At Risk (07/04/2021)   Humiliation, Afraid, Rape, and Kick questionnaire    Fear of Current or Ex-Partner: No    Emotionally Abused: No    Physically Abused: No    Sexually Abused: No    Review of Systems   General: Negative for anorexia, weight loss, fever, chills, fatigue, weakness. ENT: Negative for hoarseness, difficulty swallowing , nasal congestion. CV: Negative for chest pain, angina, palpitations, dyspnea on exertion, peripheral edema.  Respiratory: Negative for dyspnea at rest, dyspnea on exertion, cough, sputum, wheezing.  GI: See history of present illness. GU:  Negative for dysuria, hematuria, urinary incontinence, urinary frequency, nocturnal urination.  Endo: Negative for unusual weight change.     Physical Exam   There were no vitals taken for this visit.   General: Well-nourished, well-developed in no acute distress.  Eyes: No icterus. Mouth: Oropharyngeal mucosa moist and pink , no lesions erythema or exudate. Lungs: Clear to auscultation bilaterally.  Heart: Regular rate and rhythm, no murmurs rubs or gallops.  Abdomen: Bowel sounds are normal, nontender,  nondistended, no hepatosplenomegaly or masses,  no abdominal bruits or hernia , no rebound or guarding.  Rectal: ***  Extremities: No lower extremity edema. No clubbing or deformities. Neuro: Alert and oriented x 4   Skin: Warm and dry, no jaundice.   Psych: Alert and cooperative, normal mood and affect.  Labs   Lab Results  Component Value Date   CREATININE 0.98 07/01/2022   BUN 15 07/01/2022   NA 137 07/01/2022   K 3.1 (L) 07/01/2022   CL 100 07/01/2022   CO2 30 07/01/2022   Lab Results  Component Value Date   ALT 7 09/04/2021   AST 12 09/04/2021   ALKPHOS 101 09/04/2021   BILITOT 0.7 09/04/2021   Lab Results  Component Value Date   WBC 5.0 07/01/2022   HGB 11.5 (L) 07/01/2022   HCT 35.1 (L) 07/01/2022   MCV 93.9 07/01/2022   PLT 195 07/01/2022   Lab Results  Component Value Date   VITAMINB12 706 02/11/2022   Lab Results  Component Value Date   FOLATE 16.7 10/31/2021   Lab Results  Component Value Date   IRON 74 10/31/2021   TIBC 376 10/31/2021   FERRITIN 35 02/11/2022    Imaging Studies   No results found.  Assessment       PLAN   ***   Leanna Battles. Melvyn Neth, MHS, PA-C [facility]

## 2022-08-07 ENCOUNTER — Encounter: Payer: Self-pay | Admitting: Gastroenterology

## 2022-08-07 ENCOUNTER — Ambulatory Visit: Payer: Medicare Other | Admitting: Gastroenterology

## 2022-08-07 VITALS — BP 135/62 | HR 54 | Temp 98.3°F | Ht 65.0 in | Wt 139.4 lb

## 2022-08-07 DIAGNOSIS — D649 Anemia, unspecified: Secondary | ICD-10-CM

## 2022-08-07 DIAGNOSIS — K5900 Constipation, unspecified: Secondary | ICD-10-CM | POA: Diagnosis not present

## 2022-08-07 NOTE — Patient Instructions (Addendum)
Please make sure you are taking vitamin B12 1000 mcg daily. This is over the counter.  We will have you update labs in 3 months.  You can cut back on miralax powder and use only as needed for constipation. If you go more than 24 hours without a bowel movement, take a dose of miralax. Return office visit in one year or call sooner if needed.

## 2022-08-15 ENCOUNTER — Other Ambulatory Visit: Payer: Self-pay | Admitting: Nurse Practitioner

## 2022-08-15 DIAGNOSIS — K219 Gastro-esophageal reflux disease without esophagitis: Secondary | ICD-10-CM

## 2022-08-15 DIAGNOSIS — I25111 Atherosclerotic heart disease of native coronary artery with angina pectoris with documented spasm: Secondary | ICD-10-CM

## 2022-08-20 NOTE — Patient Instructions (Signed)
[name] , Thank you for taking time to come for your Medicare Wellness Visit. I appreciate your ongoing commitment to your health goals. Please review the following plan we discussed and let me know if I can assist you in the future.   These are the goals we discussed:  Goals      DIET - INCREASE WATER INTAKE     Remain active and independent        This is a list of the screening recommended for you and due dates:  Health Maintenance  Topic Date Due   COVID-19 Vaccine (4 - 2023-24 season) 12/21/2021   Zoster (Shingles) Vaccine (1 of 2) 09/04/2022*   Flu Shot  11/21/2022   Medicare Annual Wellness Visit  08/21/2023   DEXA scan (bone density measurement)  12/05/2023   DTaP/Tdap/Td vaccine (2 - Td or Tdap) 06/16/2030   Pneumonia Vaccine  Completed   HPV Vaccine  Aged Out  *Topic was postponed. The date shown is not the original due date.    Advanced directives: Forms are available if you choose in the future to pursue completion.  This is recommended in order to make sure that your health wishes are honored in the event that you are unable to verbalize them to the provider.    Conditions/risks identified: Aim for 30 minutes of exercise or brisk walking, 6-8 glasses of water, and 5 servings of fruits and vegetables each day.   Next appointment: Follow up in one year for your annual wellness visit    Preventive Care 65 Years and Older, Female Preventive care refers to lifestyle choices and visits with your health care provider that can promote health and wellness. What does preventive care include? A yearly physical exam. This is also called an annual well check. Dental exams once or twice a year. Routine eye exams. Ask your health care provider how often you should have your eyes checked. Personal lifestyle choices, including: Daily care of your teeth and gums. Regular physical activity. Eating a healthy diet. Avoiding tobacco and drug use. Limiting alcohol  use. Practicing safe sex. Taking low-dose aspirin every day. Taking vitamin and mineral supplements as recommended by your health care provider. What happens during an annual well check? The services and screenings done by your health care provider during your annual well check will depend on your age, overall health, lifestyle risk factors, and family history of disease. Counseling  Your health care provider may ask you questions about your: Alcohol use. Tobacco use. Drug use. Emotional well-being. Home and relationship well-being. Sexual activity. Eating habits. History of falls. Memory and ability to understand (cognition). Work and work Astronomer. Reproductive health. Screening  You may have the following tests or measurements: Height, weight, and BMI. Blood pressure. Lipid and cholesterol levels. These may be checked every 5 years, or more frequently if you are over 81 years old. Skin check. Lung cancer screening. You may have this screening every year starting at age 24 if you have a 30-pack-year history of smoking and currently smoke or have quit within the past 15 years. Fecal occult blood test (FOBT) of the stool. You may have this test every year starting at age 106. Flexible sigmoidoscopy or colonoscopy. You may have a sigmoidoscopy every 5 years or a colonoscopy every 10 years starting at age 71. Hepatitis C blood test. Hepatitis B blood test. Sexually transmitted disease (STD) testing. Diabetes screening. This is done by checking your blood sugar (glucose) after you have not eaten for  a while (fasting). You may have this done every 1-3 years. Bone density scan. This is done to screen for osteoporosis. You may have this done starting at age 41. Mammogram. This may be done every 1-2 years. Talk to your health care provider about how often you should have regular mammograms. Talk with your health care provider about your test results, treatment options, and if necessary,  the need for more tests. Vaccines  Your health care provider may recommend certain vaccines, such as: Influenza vaccine. This is recommended every year. Tetanus, diphtheria, and acellular pertussis (Tdap, Td) vaccine. You may need a Td booster every 10 years. Zoster vaccine. You may need this after age 63. Pneumococcal 13-valent conjugate (PCV13) vaccine. One dose is recommended after age 59. Pneumococcal polysaccharide (PPSV23) vaccine. One dose is recommended after age 83. Talk to your health care provider about which screenings and vaccines you need and how often you need them. This information is not intended to replace advice given to you by your health care provider. Make sure you discuss any questions you have with your health care provider. Document Released: 05/05/2015 Document Revised: 12/27/2015 Document Reviewed: 02/07/2015 Elsevier Interactive Patient Education  2017 [place] Prevention in the Home Falls can cause injuries. They can happen to people of all ages. There are many things you can do to make your home safe and to help prevent falls. What can I do on the outside of my home? Regularly fix the edges of walkways and driveways and fix any cracks. Remove anything that might make you trip as you walk through a door, such as a raised step or threshold. Trim any bushes or trees on the path to your home. Use bright outdoor lighting. Clear any walking paths of anything that might make someone trip, such as rocks or tools. Regularly check to see if handrails are loose or broken. Make sure that both sides of any steps have handrails. Any raised decks and porches should have guardrails on the edges. Have any leaves, snow, or ice cleared regularly. Use sand or salt on walking paths during winter. Clean up any spills in your garage right away. This includes oil or grease spills. What can I do in the bathroom? Use night lights. Install grab bars by the toilet and in the  tub and shower. Do not use towel bars as grab bars. Use non-skid mats or decals in the tub or shower. If you need to sit down in the shower, use a plastic, non-slip stool. Keep the floor dry. Clean up any water that spills on the floor as soon as it happens. Remove soap buildup in the tub or shower regularly. Attach bath mats securely with double-sided non-slip rug tape. Do not have throw rugs and other things on the floor that can make you trip. What can I do in the bedroom? Use night lights. Make sure that you have a light by your bed that is easy to reach. Do not use any sheets or blankets that are too big for your bed. They should not hang down onto the floor. Have a firm chair that has side arms. You can use this for support while you get dressed. Do not have throw rugs and other things on the floor that can make you trip. What can I do in the kitchen? Clean up any spills right away. Avoid walking on wet floors. Keep items that you use a lot in easy-to-reach places. If you need to reach something above  you, use a strong step stool that has a grab bar. Keep electrical cords out of the way. Do not use floor polish or wax that makes floors slippery. If you must use wax, use non-skid floor wax. Do not have throw rugs and other things on the floor that can make you trip. What can I do with my stairs? Do not leave any items on the stairs. Make sure that there are handrails on both sides of the stairs and use them. Fix handrails that are broken or loose. Make sure that handrails are as long as the stairways. Check any carpeting to make sure that it is firmly attached to the stairs. Fix any carpet that is loose or worn. Avoid having throw rugs at the top or bottom of the stairs. If you do have throw rugs, attach them to the floor with carpet tape. Make sure that you have a light switch at the top of the stairs and the bottom of the stairs. If you do not have them, ask someone to add them for  you. What else can I do to help prevent falls? Wear shoes that: Do not have high heels. Have rubber bottoms. Are comfortable and fit you well. Are closed at the toe. Do not wear sandals. If you use a stepladder: Make sure that it is fully opened. Do not climb a closed stepladder. Make sure that both sides of the stepladder are locked into place. Ask someone to hold it for you, if possible. Clearly mark and make sure that you can see: Any grab bars or handrails. First and last steps. Where the edge of each step is. Use tools that help you move around (mobility aids) if they are needed. These include: Canes. Walkers. Scooters. Crutches. Turn on the lights when you go into a dark area. Replace any light bulbs as soon as they burn out. Set up your furniture so you have a clear path. Avoid moving your furniture around. If any of your floors are uneven, fix them. If there are any pets around you, be aware of where they are. Review your medicines with your doctor. Some medicines can make you feel dizzy. This can increase your chance of falling. Ask your doctor what other things that you can do to help prevent falls. This information is not intended to replace advice given to you by your health care provider. Make sure you discuss any questions you have with your health care provider. Document Released: 02/02/2009 Document Revised: 09/14/2015 Document Reviewed: 05/13/2014 Elsevier Interactive Patient Education  2017 [employer].

## 2022-08-20 NOTE — Progress Notes (Unsigned)
Subjective:   Teresa Holloway is a 82 y.o. female who presents for Medicare Annual (Subsequent) preventive examination.  Review of Systems    ***       Objective:    There were no vitals filed for this visit. There is no height or weight on file to calculate BMI.     07/01/2022    9:54 AM 07/16/2021    9:40 AM 07/11/2021   12:54 PM 07/04/2021    2:11 PM 05/18/2020    1:20 PM 01/25/2019    1:15 PM 01/18/2019    1:14 PM  Advanced Directives  Does Patient Have a Medical Advance Directive? No  No No No No No  Would patient like information on creating a medical advance directive?  No - Patient declined No - Patient declined No - Patient declined No - Patient declined No - Patient declined     Current Medications (verified) Outpatient Encounter Medications as of 08/21/2022  Medication Sig   alendronate (FOSAMAX) 70 MG tablet TAKE 1 TABLE BY MOUTH EVERY 7 DAYS. TAKE WITH A FULL GLASS OF WATER ON AN EMPTY STOMACH.   amLODipine (NORVASC) 5 MG tablet Take 1 tablet (5 mg total) by mouth daily.   aspirin EC 81 MG tablet Take 1 tablet (81 mg total) by mouth 2 (two) times daily. (Patient taking differently: Take 81 mg by mouth daily as needed.)   atorvastatin (LIPITOR) 40 MG tablet TAKE 1 TABLET BY MOUTH DAILY AT 6 PM.   benazepril (LOTENSIN) 40 MG tablet Take 1 tablet (40 mg total) by mouth daily.   cholecalciferol (VITAMIN D) 1000 UNITS tablet Take 1,000 Units by mouth daily.   diclofenac sodium (VOLTAREN) 1 % GEL Apply 2 g topically 4 (four) times daily. (Patient taking differently: Apply 2 g topically 4 (four) times daily as needed (pain).)   escitalopram (LEXAPRO) 20 MG tablet Take 1 tablet (20 mg total) by mouth daily.   hydrochlorothiazide (HYDRODIURIL) 25 MG tablet Take 1 tablet (25 mg total) by mouth daily.   isosorbide mononitrate (IMDUR) 60 MG 24 hr tablet TAKE 1 TABLET BY MOUTH EVERYDAY AT BEDTIME   latanoprost (XALATAN) 0.005 % ophthalmic solution 1 drop at bedtime.   metoprolol  succinate (TOPROL-XL) 25 MG 24 hr tablet Take 1 tablet (25 mg total) by mouth daily.   nitroGLYCERIN (NITROSTAT) 0.4 MG SL tablet Place 1 tablet (0.4 mg total) under the tongue every 5 (five) minutes as needed for chest pain.   polyethylene glycol powder (GLYCOLAX/MIRALAX) 17 GM/SCOOP powder Take by mouth. 17 grams oral daily for constipation.   potassium chloride (KLOR-CON M10) 10 MEQ tablet Take 1 tablet (10 mEq total) by mouth 2 (two) times daily.   traMADol (ULTRAM) 50 MG tablet Take 1 tablet (50 mg total) by mouth 2 (two) times daily.   No facility-administered encounter medications on file as of 08/21/2022.    Allergies (verified) Atarax [hydroxyzine], Bactrim [sulfamethoxazole-trimethoprim], and Coreg [carvedilol]   History: Past Medical History:  Diagnosis Date   Anxiety    Arthritis    Knees, hips   CAD (coronary artery disease)    Chronic back pain    Depression    GERD (gastroesophageal reflux disease)    Hyperlipidemia    Hypertension    MI (myocardial infarction) (HCC)    Osteopenia    Past Surgical History:  Procedure Laterality Date   BIOPSY  07/16/2021   Procedure: BIOPSY;  Surgeon: Lanelle Bal, DO;  [facility];  Service:  Endoscopy;;   COLONOSCOPY WITH PROPOFOL N/A 07/16/2021   Procedure: COLONOSCOPY WITH PROPOFOL;  Surgeon: Lanelle Bal, DO;  [facility];  Service: Endoscopy;  Laterality: N/A;  10:30am   ECTOPIC PREGNANCY SURGERY     ESOPHAGOGASTRODUODENOSCOPY (EGD) WITH PROPOFOL N/A 07/16/2021   Procedure: ESOPHAGOGASTRODUODENOSCOPY (EGD) WITH PROPOFOL;  Surgeon: Lanelle Bal, DO;  [facility];  Service: Endoscopy;  Laterality: N/A;   left breast biospy     left wrist fracture     TOTAL KNEE ARTHROPLASTY Left 01/25/2019   Procedure: LEFT TOTAL KNEE ARTHROPLASTY;  Surgeon: Gean Birchwood, MD;  Location: [facility];  Service: Orthopedics;  Laterality: Left;   Family History  Problem Relation Age of Onset   Asthma  Father    Diabetes Sister    Diabetes Brother    Social History   Socioeconomic History   Marital status: Widowed    Spouse name: [name]   Number of children: 1   Years of education: Not on file   Highest education level: 10th grade  Occupational History   Occupation: Retired  Tobacco Use   Smoking status: Never   Smokeless tobacco: Never  Vaping Use   Vaping Use: Never used  Substance and Sexual Activity   Alcohol use: No   Drug use: No   Sexual activity: Not Currently    Birth control/protection: Post-menopausal  Other Topics Concern   Not on file  Social History Narrative   Husband passed 12/2020.    1 son-[name].   2 grandchildren   Twin great grandchildren.   Social Determinants of Health   Financial Resource Strain: Low Risk  (07/04/2021)   Overall Financial Resource Strain (CARDIA)    Difficulty of Paying Living Expenses: Not hard at all  Food Insecurity: No Food Insecurity (07/04/2021)   Hunger Vital Sign    Worried About Running Out of Food in the Last Year: Never true    Ran Out of Food in the Last Year: Never true  Transportation Needs: No Transportation Needs (07/04/2021)   PRAPARE - Administrator, Civil Service (Medical): No    Lack of Transportation (Non-Medical): No  Physical Activity: Insufficiently Active (07/04/2021)   Exercise Vital Sign    Days of Exercise per Week: 5 days    Minutes of Exercise per Session: 20 min  Stress: No Stress Concern Present (07/04/2021)   [employer] of Occupational Health - Occupational Stress Questionnaire    Feeling of Stress : Only a little  Social Connections: Moderately Isolated (07/04/2021)   Social Connection and Isolation Panel [NHANES]    Frequency of Communication with Friends and Family: More than three times a week    Frequency of Social Gatherings with Friends and Family: More than three times a week    Attends Religious Services: 1 to 4 times per year    Active Member of [employer] or  Organizations: No    Attends Banker Meetings: Never    Marital Status: Widowed    Tobacco Counseling Counseling given: Not Answered   Clinical Intake:                 Diabetic?No          Activities of Daily Living     No data to display          Patient Care Team: Bennie Pierini, FNP as PCP - General (Nurse Practitioner) Ginette Otto, [facility], Lilla Shook, [facility] as [employer]  Care Management (Pharmacist)  Indicate any recent Medical Services you may have received from other than [facility] providers in the past year (date may be approximate).     Assessment:   This is a routine wellness examination for Ayriana.  Hearing/Vision screen No results found.  Dietary issues and exercise activities discussed:     Goals Addressed   None    Depression Screen    06/11/2022   10:18 AM 06/06/2022   10:09 AM 03/11/2022   11:37 AM 03/05/2022    2:06 PM 12/04/2021   11:36 AM 07/04/2021    2:04 PM 06/07/2021   11:08 AM  PHQ 2/9 Scores  PHQ - 2 Score 0 0 0 4 0 1 2  PHQ- 9 Score 0 0 0 9 0 1 3    Fall Risk    06/11/2022   10:18 AM 06/06/2022   10:09 AM 03/11/2022   11:37 AM 03/05/2022    2:06 PM 12/04/2021   11:36 AM  Fall Risk   Falls in the past year? 0 0 0 0 0    FALL RISK PREVENTION PERTAINING TO THE HOME:  Any stairs in or around the home? {YES/NO:21197} If so, are there any without handrails? {YES/NO:21197} Home free of loose throw rugs in walkways, pet beds, electrical cords, etc? {YES/NO:21197} Adequate lighting in your home to reduce risk of falls? {YES/NO:21197}  ASSISTIVE DEVICES UTILIZED TO PREVENT FALLS:  Life alert? {YES/NO:21197} Use of a cane, walker or w/c? {YES/NO:21197} Grab bars in the bathroom? {YES/NO:21197} Shower chair or bench in shower? {YES/NO:21197} Elevated toilet seat or a handicapped toilet? {YES/NO:21197}  TIMED UP AND GO:  Was the test performed? No . Telephonic visit    Cognitive Function:        07/04/2021    2:17 PM 05/18/2020    1:21 PM 09/18/2018    3:38 PM  6CIT Screen  What Year? 0 points 0 points 0 points  What month? 0 points  0 points  What time? 0 points 0 points 0 points  Count back from 20 0 points 0 points 0 points  Months in reverse 4 points 4 points 2 points  Repeat phrase 4 points 2 points 4 points  Total Score 8 points  6 points    Immunizations Immunization History  Administered Date(s) Administered   Fluad Quad(high Dose 65+) 03/07/2020, 03/08/2021, 03/11/2022   Influenza, High Dose Seasonal PF 02/22/2016, 03/07/2017, 03/13/2018   Influenza,inj,Quad PF,6+ Mos 02/20/2015   Moderna Sars-Covid-2 Vaccination 06/23/2019, 07/21/2019, 05/16/2020   Pneumococcal Conjugate-13 07/01/2014   Pneumococcal Polysaccharide-23 07/14/2015   Tdap 06/16/2020   Zoster, Live 12/30/2013    TDAP status: Up to date  Flu Vaccine status: Up to date  Pneumococcal vaccine status: Up to date  Covid-19 vaccine status: Information provided on how to obtain vaccines.   Qualifies for Shingles Vaccine? Yes   Zostavax completed No   Shingrix Completed?: No.    Education has been provided regarding the importance of this vaccine. Patient has been advised to call insurance company to determine out of pocket expense if they have not yet received this vaccine. Advised may also receive vaccine at local pharmacy or Health Dept. Verbalized acceptance and understanding.  Screening Tests Health Maintenance  Topic Date Due   COVID-19 Vaccine (4 - 2023-24 season) 12/21/2021   Medicare Annual Wellness (AWV)  07/05/2022   Zoster Vaccines- Shingrix (1 of 2) 09/04/2022 (Originally 01/04/1960)   INFLUENZA VACCINE  11/21/2022   DEXA SCAN  12/05/2023  DTaP/Tdap/Td (2 - Td or Tdap) 06/16/2030   Pneumonia Vaccine 53+ Years old  Completed   HPV VACCINES  Aged Out    Health Maintenance  Health Maintenance Due  Topic Date Due   COVID-19 Vaccine (4 - 2023-24  season) 12/21/2021   Medicare Annual Wellness (AWV)  07/05/2022    Colorectal cancer screening: No longer required.   Mammogram status: No longer required due to age.  Bone Density status: Completed 12/04/21. Results reflect: Bone density results: OSTEOPOROSIS. Repeat every 2 years.  Lung Cancer Screening: (Low Dose CT Chest recommended if Age 41-80 years, 30 pack-year currently smoking OR have quit w/in 15years.) does not qualify.   Lung Cancer Screening Referral: n/a  Additional Screening:  Hepatitis C Screening: does not qualify;  Vision Screening: Recommended annual ophthalmology exams for early detection of glaucoma and other disorders of the eye. Is the patient up to date with their annual eye exam?  {YES/NO:21197} Who is the provider or what is the name of the office in which the patient attends annual eye exams? *** If pt is not established with a provider, would they like to be referred to a provider to establish care? {YES/NO:21197}.   Dental Screening: Recommended annual dental exams for proper oral hygiene  Community Resource Referral / Chronic Care Management: CRR required this visit?  {YES/NO:21197}  CCM required this visit?  {YES/NO:21197}     Plan:     I have personally reviewed and noted the following in the patient's chart:   Medical and social history Use of alcohol, tobacco or illicit drugs  Current medications and supplements including opioid prescriptions. {Opioid Prescriptions:[phone]} Functional ability and status Nutritional status Physical activity Advanced directives List of other physicians Hospitalizations, surgeries, and ER visits in previous 12 months Vitals Screenings to include cognitive, depression, and falls Referrals and appointments  In addition, I have reviewed and discussed with patient certain preventive protocols, quality metrics, and best practice recommendations. A written personalized care plan for preventive services as  well as general preventive health recommendations were provided to patient.     Durwin Nora, [place]   6/96/2952   Due to this being a virtual visit, the after visit summary with patients personalized plan was offered to patient via mail or my-chart. ***Patient declined at this time./ Patient would like to access on my-chart/ per request, patient was mailed a copy of AVS./ Patient preferred to pick up at office at next visit  Nurse Notes: ***

## 2022-08-21 ENCOUNTER — Ambulatory Visit (INDEPENDENT_AMBULATORY_CARE_PROVIDER_SITE_OTHER): Payer: Medicare Other

## 2022-08-21 VITALS — Ht 65.0 in | Wt 139.0 lb

## 2022-08-21 DIAGNOSIS — Z0000 Encounter for general adult medical examination without abnormal findings: Secondary | ICD-10-CM | POA: Diagnosis not present

## 2022-09-05 ENCOUNTER — Encounter: Payer: Self-pay | Admitting: Nurse Practitioner

## 2022-09-05 ENCOUNTER — Ambulatory Visit (INDEPENDENT_AMBULATORY_CARE_PROVIDER_SITE_OTHER): Payer: Medicare Other | Admitting: Nurse Practitioner

## 2022-09-05 VITALS — BP 131/65 | HR 52 | Temp 97.1°F | Resp 20 | Ht 65.0 in | Wt 139.0 lb

## 2022-09-05 DIAGNOSIS — M170 Bilateral primary osteoarthritis of knee: Secondary | ICD-10-CM | POA: Diagnosis not present

## 2022-09-05 DIAGNOSIS — K219 Gastro-esophageal reflux disease without esophagitis: Secondary | ICD-10-CM | POA: Diagnosis not present

## 2022-09-05 DIAGNOSIS — M810 Age-related osteoporosis without current pathological fracture: Secondary | ICD-10-CM

## 2022-09-05 DIAGNOSIS — E876 Hypokalemia: Secondary | ICD-10-CM

## 2022-09-05 DIAGNOSIS — M1612 Unilateral primary osteoarthritis, left hip: Secondary | ICD-10-CM

## 2022-09-05 DIAGNOSIS — D649 Anemia, unspecified: Secondary | ICD-10-CM

## 2022-09-05 DIAGNOSIS — I10 Essential (primary) hypertension: Secondary | ICD-10-CM | POA: Diagnosis not present

## 2022-09-05 DIAGNOSIS — K5900 Constipation, unspecified: Secondary | ICD-10-CM | POA: Diagnosis not present

## 2022-09-05 DIAGNOSIS — F3342 Major depressive disorder, recurrent, in full remission: Secondary | ICD-10-CM

## 2022-09-05 DIAGNOSIS — Z6830 Body mass index (BMI) 30.0-30.9, adult: Secondary | ICD-10-CM

## 2022-09-05 DIAGNOSIS — E785 Hyperlipidemia, unspecified: Secondary | ICD-10-CM | POA: Diagnosis not present

## 2022-09-05 DIAGNOSIS — I25111 Atherosclerotic heart disease of native coronary artery with angina pectoris with documented spasm: Secondary | ICD-10-CM

## 2022-09-05 MED ORDER — HYDROCHLOROTHIAZIDE 25 MG PO TABS: 25.0000 mg | ORAL_TABLET | Freq: Every day | ORAL | 1 refills | Status: DC

## 2022-09-05 MED ORDER — ESCITALOPRAM OXALATE 20 MG PO TABS: 20.0000 mg | ORAL_TABLET | Freq: Every day | ORAL | 1 refills | Status: DC

## 2022-09-05 MED ORDER — TRAMADOL HCL 50 MG PO TABS: 50.0000 mg | ORAL_TABLET | Freq: Two times a day (BID) | ORAL | 2 refills | Status: DC

## 2022-09-05 MED ORDER — POTASSIUM CHLORIDE CRYS ER 10 MEQ PO TBCR: 10.0000 meq | EXTENDED_RELEASE_TABLET | Freq: Two times a day (BID) | ORAL | 1 refills | Status: DC

## 2022-09-05 MED ORDER — ATORVASTATIN CALCIUM 40 MG PO TABS: ORAL_TABLET | ORAL | 1 refills | Status: DC

## 2022-09-05 MED ORDER — ISOSORBIDE MONONITRATE ER 60 MG PO TB24: ORAL_TABLET | ORAL | 1 refills | Status: DC

## 2022-09-05 MED ORDER — ALENDRONATE SODIUM 70 MG PO TABS: ORAL_TABLET | ORAL | 1 refills | Status: DC

## 2022-09-05 MED ORDER — METOPROLOL SUCCINATE ER 25 MG PO TB24: 25.0000 mg | ORAL_TABLET | Freq: Every day | ORAL | 11 refills | Status: DC

## 2022-09-05 MED ORDER — BENAZEPRIL HCL 40 MG PO TABS: 40.0000 mg | ORAL_TABLET | Freq: Every day | ORAL | 1 refills | Status: DC

## 2022-09-05 MED ORDER — AMLODIPINE BESYLATE 5 MG PO TABS: 5.0000 mg | ORAL_TABLET | Freq: Every day | ORAL | 1 refills | Status: DC

## 2022-09-05 NOTE — Progress Notes (Signed)
Subjective:    Patient ID: Teresa Holloway, female    DOB: [birth year], 82 y.o.   MRN: [id number]   Chief Complaint: medical management of chronic issues     HPI:  Teresa Holloway is a 82 y.o. who identifies as a female who was assigned female at birth.   Social history: Lives with: by herself since her husband passed away. Her son stays with her often Work history: retired   Water engineer in today for follow up of the following chronic medical issues:  1. Essential hypertension, benign No c/o chest pain, sob or headache. Does not check blood pressure at home. BP Readings from Last 3 Encounters:  08/07/22 135/62  07/01/22 (!) 141/65  06/11/22 139/70     2. Coronary artery disease involving native coronary artery of native heart with angina pectoris with documented spasm [facility]) Last saw cardiology on 02/26/22.AT last visit her HR was low and they decreased her toprol to 25mg  daily and discontinued her plavix. She was to report any continued chest pain.   3. Gastroesophageal reflux disease, unspecified whether esophagitis present Is on protonix daily and is doing well.  4. Hyperlipidemia with target LDL less than 100 Does try to watch diet but is not able to do any exercise due to her arthritic knees. Lab Results  Component Value Date   CHOL 124 09/04/2021   HDL 74 09/04/2021   LDLCALC 35 09/04/2021   TRIG 78 09/04/2021   CHOLHDL 1.7 09/04/2021     5. Anemia, unspecified type No c/o fatigue. Lab Results  Component Value Date   HGB 11.5 (L) 07/01/2022     6. Constipation, unspecified constipation type Has frequently. Usually will take dulcolax or miralax which helps.  7. Recurrent major depressive disorder, in full remission [facility]) She is on lexapro and is doing ok.    09/05/2022   10:13 AM 08/21/2022   12:59 PM 06/11/2022   10:18 AM  Depression screen PHQ 2/9  Decreased Interest 0 0 0  Down, Depressed, Hopeless 0 0 0  PHQ - 2 Score 0 0 0  Altered sleeping 0  0   Tired, decreased energy 0  0  Change in appetite 0  0  Feeling bad or failure about yourself  0  0  Trouble concentrating 0  0  Moving slowly or fidgety/restless 0  0  Suicidal thoughts 0  0  PHQ-9 Score 0  0  Difficult doing work/chores Not difficult at all  Not difficult at all     8. Primary osteoarthritis of left hip 9. Arthritis of both knees Pain assessment: Cause of pain- osteoarthritis Pain location- hips and knees Pain on scale of 1-10- 7-8/10 Frequency- daily What increases pain-walking or standing What makes pain Better-pain meds help Effects on ADL - none Any change in general medical condition-no  Current opioids rx- ultram BID # meds rx- 60 Effectiveness of current meds-helps Adverse reactions from pain meds-none Morphine equivalent- 10 MME  Pill count performed-No Last drug screen - 09/04/21 ( high risk q25m, moderate risk q28m, low risk yearly ) Urine drug screen today- Yes Was the N[facility] reviewed- yes  If yes were their any concerning findings? - no   Overdose risk: 1    06/06/2022   10:33 AM  Opioid Risk   Alcohol 0  Illegal Drugs 0  Rx Drugs 0  Alcohol 0  Illegal Drugs 0  Rx Drugs 0  Age between 16-45 years  0  History of Preadolescent Sexual  Abuse 0  Psychological Disease 0  Depression 0  Opioid Risk Tool Scoring 0  Opioid Risk Interpretation Low Risk     Pain contract signed on:   51. Age related osteoporosis, unspecified pathological fracture presence Last dexascan was done on 12/04/21. T score was  -3.4- nomt able to do weight bearing exercises due to osteoarthritis of knees and hip.  11. BMI 30.0-30.9,adult No recent weight changes Wt Readings from Last 3 Encounters:  09/05/22 139 lb (63 kg)  08/21/22 139 lb (63 kg)  08/07/22 139 lb 6.4 oz (63.2 kg)   BMI Readings from Last 3 Encounters:  09/05/22 23.13 kg/m  08/21/22 23.13 kg/m  08/07/22 23.20 kg/m     New complaints: None today  Allergies  Allergen Reactions    Atarax [Hydroxyzine] Nausea And Vomiting   Bactrim [Sulfamethoxazole-Trimethoprim] Nausea Only   Coreg [Carvedilol] Other (See Comments)    Unknown   Outpatient Encounter Medications as of 09/05/2022  Medication Sig   alendronate (FOSAMAX) 70 MG tablet TAKE 1 TABLE BY MOUTH EVERY 7 DAYS. TAKE WITH A FULL GLASS OF WATER ON AN EMPTY STOMACH.   amLODipine (NORVASC) 5 MG tablet Take 1 tablet (5 mg total) by mouth daily.   aspirin EC 81 MG tablet Take 1 tablet (81 mg total) by mouth 2 (two) times daily. (Patient taking differently: Take 81 mg by mouth daily as needed.)   atorvastatin (LIPITOR) 40 MG tablet TAKE 1 TABLET BY MOUTH DAILY AT 6 PM.   benazepril (LOTENSIN) 40 MG tablet Take 1 tablet (40 mg total) by mouth daily.   cholecalciferol (VITAMIN D) 1000 UNITS tablet Take 1,000 Units by mouth daily.   diclofenac sodium (VOLTAREN) 1 % GEL Apply 2 g topically 4 (four) times daily. (Patient taking differently: Apply 2 g topically 4 (four) times daily as needed (pain).)   escitalopram (LEXAPRO) 20 MG tablet Take 1 tablet (20 mg total) by mouth daily.   hydrochlorothiazide (HYDRODIURIL) 25 MG tablet Take 1 tablet (25 mg total) by mouth daily.   isosorbide mononitrate (IMDUR) 60 MG 24 hr tablet TAKE 1 TABLET BY MOUTH EVERYDAY AT BEDTIME   latanoprost (XALATAN) 0.005 % ophthalmic solution 1 drop at bedtime.   metoprolol succinate (TOPROL-XL) 25 MG 24 hr tablet Take 1 tablet (25 mg total) by mouth daily.   nitroGLYCERIN (NITROSTAT) 0.4 MG SL tablet Place 1 tablet (0.4 mg total) under the tongue every 5 (five) minutes as needed for chest pain.   pantoprazole (PROTONIX) 40 MG tablet Take 40 mg by mouth 2 (two) times daily.   polyethylene glycol powder (GLYCOLAX/MIRALAX) 17 GM/SCOOP powder Take by mouth. 17 grams oral daily for constipation.   potassium chloride (KLOR-CON M10) 10 MEQ tablet Take 1 tablet (10 mEq total) by mouth 2 (two) times daily.   traMADol (ULTRAM) 50 MG tablet Take 1 tablet (50 mg  total) by mouth 2 (two) times daily.   No facility-administered encounter medications on file as of 09/05/2022.    Past Surgical History:  Procedure Laterality Date   BIOPSY  07/16/2021   Procedure: BIOPSY;  Surgeon: Lanelle Bal, DO;  [facility];  Service: Endoscopy;;   COLONOSCOPY WITH PROPOFOL N/A 07/16/2021   Procedure: COLONOSCOPY WITH PROPOFOL;  Surgeon: Lanelle Bal, DO;  [facility];  Service: Endoscopy;  Laterality: N/A;  10:30am   ECTOPIC PREGNANCY SURGERY     ESOPHAGOGASTRODUODENOSCOPY (EGD) WITH PROPOFOL N/A 07/16/2021   Procedure: ESOPHAGOGASTRODUODENOSCOPY (EGD) WITH PROPOFOL;  Surgeon: Lanelle Bal,  DO;  [facility];  Service: Endoscopy;  Laterality: N/A;   left breast biospy     left wrist fracture     TOTAL KNEE ARTHROPLASTY Left 01/25/2019   Procedure: LEFT TOTAL KNEE ARTHROPLASTY;  Surgeon: Gean Birchwood, MD;  Location: [facility];  Service: Orthopedics;  Laterality: Left;    Family History  Problem Relation Age of Onset   Asthma Father    Diabetes Sister    Diabetes Brother       Controlled substance contract: n/a     Review of Systems  Constitutional:  Negative for diaphoresis.  Eyes:  Negative for pain.  Respiratory:  Negative for shortness of breath.   Cardiovascular:  Negative for chest pain, palpitations and leg swelling.  Gastrointestinal:  Negative for abdominal pain.  Endocrine: Negative for polydipsia.  Musculoskeletal:  Positive for arthralgias (right hip and knee).  Skin:  Negative for rash.  Neurological:  Negative for dizziness, weakness and headaches.  Hematological:  Does not bruise/bleed easily.  All other systems reviewed and are negative.      Objective:   Physical Exam Vitals and nursing note reviewed.  Constitutional:      General: She is not in acute distress.    Appearance: Normal appearance. She is well-developed.  HENT:     Head: Normocephalic.     Right Ear: Tympanic  membrane normal.     Left Ear: Tympanic membrane normal.     Nose: Nose normal.     Mouth/Throat:     Mouth: Mucous membranes are moist.  Eyes:     Pupils: Pupils are equal, round, and reactive to light.  Neck:     Vascular: No carotid bruit or JVD.  Cardiovascular:     Rate and Rhythm: Normal rate and regular rhythm.     Heart sounds: Normal heart sounds.  Pulmonary:     Effort: Pulmonary effort is normal. No respiratory distress.     Breath sounds: Normal breath sounds. No wheezing or rales.  Chest:     Chest wall: No tenderness.  Abdominal:     General: Bowel sounds are normal. There is no distension or abdominal bruit.     Palpations: Abdomen is soft. There is no hepatomegaly, splenomegaly, mass or pulsatile mass.     Tenderness: There is no abdominal tenderness.  Musculoskeletal:        General: Normal range of motion.     Cervical back: Normal range of motion and neck supple.     Comments: Limps on right when walking  Lymphadenopathy:     Cervical: No cervical adenopathy.  Skin:    General: Skin is warm and dry.  Neurological:     Mental Status: She is alert and oriented to person, place, and time.     Deep Tendon Reflexes: Reflexes are normal and symmetric.  Psychiatric:        Behavior: Behavior normal.        Thought Content: Thought content normal.        Judgment: Judgment normal.    BP 131/65   Pulse (!) 52   Temp (!) 97.1 F (36.2 C) (Temporal)   Resp 20   Ht 5\' 5"  (1.651 m)   Wt 139 lb (63 kg)   SpO2 93%   BMI 23.13 kg/m         Assessment & Plan:   Teresa Holloway comes in today with chief complaint of Medical Management of Chronic Issues   Diagnosis and orders  addressed:  1. Essential hypertension, benign Low sodium diet - amLODipine (NORVASC) 5 MG tablet; Take 1 tablet (5 mg total) by mouth daily.  Dispense: 90 tablet; Refill: 1 - benazepril (LOTENSIN) 40 MG tablet; Take 1 tablet (40 mg total) by mouth daily.  Dispense: 90 tablet;  Refill: 1 - hydrochlorothiazide (HYDRODIURIL) 25 MG tablet; Take 1 tablet (25 mg total) by mouth daily.  Dispense: 90 tablet; Refill: 1  2. Coronary artery disease involving native coronary artery of native heart with angina pectoris with documented spasm (HCC) Keep follow up with cardiology - isosorbide mononitrate (IMDUR) 60 MG 24 hr tablet; TAKE 1 TABLET BY MOUTH EVERYDAY AT BEDTIME  Dispense: 90 tablet; Refill: 1 - metoprolol succinate (TOPROL-XL) 25 MG 24 hr tablet; Take 1 tablet (25 mg total) by mouth daily.  Dispense: 30 tablet; Refill: 11  3. Gastroesophageal reflux disease, unspecified whether esophagitis present Avoid spicy foods Do not eat 2 hours prior to bedtime  4. Hyperlipidemia with target LDL less than 100 Low fat diet - atorvastatin (LIPITOR) 40 MG tablet; TAKE 1 TABLET BY MOUTH DAILY AT 6 PM.  Dispense: 90 tablet; Refill: 1  5. Anemia, unspecified type Labs pending  6. Constipation, unspecified constipation type Miralax daily in apple juice  7. Recurrent major depressive disorder, in full remission (HCC) Stress management - hydrochlorothiazide (HYDRODIURIL) 25 MG tablet; Take 1 tablet (25 mg total) by mouth daily.  Dispense: 90 tablet; Refill: 1 - escitalopram (LEXAPRO) 20 MG tablet; Take 1 tablet (20 mg total) by mouth daily.  Dispense: 90 tablet; Refill: 1  8. Primary osteoarthritis of left hip 9. Arthritis of both knees Encouraged to see ortho to discuss knee and hip replacement - traMADol (ULTRAM) 50 MG tablet; Take 1 tablet (50 mg total) by mouth 2 (two) times daily.  Dispense: 60 tablet; Refill: 2  10. Age related osteoporosis, unspecified pathological fracture presence Weight bearing exercises when can tolerate.mmmb  11. BMI 30.0-30.9,adult Discussed diet and exercise for person with BMI >25 Will recheck weight in 3-6 months   12. Hypokalemia Labs pending - potassium chloride (KLOR-CON M10) 10 MEQ tablet; Take 1 tablet (10 mEq total) by mouth 2  (two) times daily.  Dispense: 180 tablet; Refill: 1   Labs pending Health Maintenance reviewed Diet and exercise encouraged  Follow up plan: 3 months pain management   Mary-Margaret Daphine Deutscher, FNP

## 2022-09-05 NOTE — Patient Instructions (Signed)
Fall Prevention in the Home, Adult Falls can cause injuries and can happen to people of all ages. There are many things you can do to make your home safer and to help prevent falls. What actions can I take to prevent falls? General information Use good lighting in all rooms. Make sure to: Replace any light bulbs that burn out. Turn on the lights in dark areas and use night-lights. Keep items that you use often in easy-to-reach places. Lower the shelves around your home if needed. Move furniture so that there are clear paths around it. Do not use throw rugs or other things on the floor that can make you trip. If any of your floors are uneven, fix them. Add color or contrast paint or tape to clearly mark and help you see: Grab bars or handrails. First and last steps of staircases. Where the edge of each step is. If you use a ladder or stepladder: Make sure that it is fully opened. Do not climb a closed ladder. Make sure the sides of the ladder are locked in place. Have someone hold the ladder while you use it. Know where your pets are as you move through your home. What can I do in the bathroom?     Keep the floor dry. Clean up any water on the floor right away. Remove soap buildup in the bathtub or shower. Buildup makes bathtubs and showers slippery. Use non-skid mats or decals on the floor of the bathtub or shower. Attach bath mats securely with double-sided, non-slip rug tape. If you need to sit down in the shower, use a non-slip stool. Install grab bars by the toilet and in the bathtub and shower. Do not use towel bars as grab bars. What can I do in the bedroom? Make sure that you have a light by your bed that is easy to reach. Do not use any sheets or blankets on your bed that hang to the floor. Have a firm chair or bench with side arms that you can use for support when you get dressed. What can I do in the kitchen? Clean up any spills right away. If you need to reach something  above you, use a step stool with a grab bar. Keep electrical cords out of the way. Do not use floor polish or wax that makes floors slippery. What can I do with my stairs? Do not leave anything on the stairs. Make sure that you have a light switch at the top and the bottom of the stairs. Make sure that there are handrails on both sides of the stairs. Fix handrails that are broken or loose. Install non-slip stair treads on all your stairs if they do not have carpet. Avoid having throw rugs at the top or bottom of the stairs. Choose a carpet that does not hide the edge of the steps on the stairs. Make sure that the carpet is firmly attached to the stairs. Fix carpet that is loose or worn. What can I do on the outside of my home? Use bright outdoor lighting. Fix the edges of walkways and driveways and fix any cracks. Clear paths of anything that can make you trip, such as tools or rocks. Add color or contrast paint or tape to clearly mark and help you see anything that might make you trip as you walk through a door, such as a raised step or threshold. Trim any bushes or trees on paths to your home. Check to see if handrails are loose   or broken and that both sides of all steps have handrails. Install guardrails along the edges of any raised decks and porches. Have leaves, snow, or ice cleared regularly. Use sand, salt, or ice melter on paths if you live where there is ice and snow during the winter. Clean up any spills in your garage right away. This includes grease or oil spills. What other actions can I take? Review your medicines with your doctor. Some medicines can cause dizziness or changes in blood pressure, which increase your risk of falling. Wear shoes that: Have a low heel. Do not wear high heels. Have rubber bottoms and are closed at the toe. Feel good on your feet and fit well. Use tools that help you move around if needed. These include: Canes. Walkers. Scooters. Crutches. Ask  your doctor what else you can do to help prevent falls. This may include seeing a physical therapist to learn to do exercises to move better and get stronger. Where to find more information Centers for Disease Control and Prevention, [employer]: cdc.gov National Institute on Aging: nia.nih.gov National Institute on Aging: nia.nih.gov Contact a doctor if: You are afraid of falling at home. You feel weak, drowsy, or dizzy at home. You fall at home. Get help right away if you: Lose consciousness or have trouble moving after a fall. Have a fall that causes a head injury. These symptoms may be an emergency. Get help right away. Call 911. Do not wait to see if the symptoms will go away. Do not drive yourself to the hospital. This information is not intended to replace advice given to you by your health care provider. Make sure you discuss any questions you have with your health care provider. Document Revised: 12/10/2021 Document Reviewed: 12/10/2021 Elsevier Patient Education  2023 Elsevier Inc.  

## 2022-09-06 LAB — CBC WITH DIFFERENTIAL/PLATELET
Basophils Absolute: 0.1 10*3/uL (ref 0.0–0.2)
Basos: 1 %
EOS (ABSOLUTE): 0.2 10*3/uL (ref 0.0–0.4)
Eos: 3 %
Hematocrit: 34.2 % (ref 34.0–46.6)
Hemoglobin: 11 g/dL — ABNORMAL LOW (ref 11.1–15.9)
Immature Grans (Abs): 0 10*3/uL (ref 0.0–0.1)
Immature Granulocytes: 0 %
Lymphocytes Absolute: 1.7 10*3/uL (ref 0.7–3.1)
Lymphs: 31 %
MCH: 30 pg (ref 26.6–33.0)
MCHC: 32.2 g/dL (ref 31.5–35.7)
MCV: 93 fL (ref 79–97)
Monocytes Absolute: 0.6 10*3/uL (ref 0.1–0.9)
Monocytes: 11 %
Neutrophils Absolute: 2.9 10*3/uL (ref 1.4–7.0)
Neutrophils: 54 %
Platelets: 217 10*3/uL (ref 150–450)
RBC: 3.67 x10E6/uL — ABNORMAL LOW (ref 3.77–5.28)
RDW: 13 % (ref 11.7–15.4)
WBC: 5.4 10*3/uL (ref 3.4–10.8)

## 2022-09-06 LAB — CMP14+EGFR
ALT: 10 IU/L (ref 0–32)
AST: 14 IU/L (ref 0–40)
Albumin/Globulin Ratio: 1.9 (ref 1.2–2.2)
Albumin: 4.1 g/dL (ref 3.8–4.8)
Alkaline Phosphatase: 101 IU/L (ref 44–121)
BUN/Creatinine Ratio: 16 (ref 12–28)
BUN: 17 mg/dL (ref 8–27)
Bilirubin Total: 0.9 mg/dL (ref 0.0–1.2)
CO2: 27 mmol/L (ref 20–29)
Calcium: 9.9 mg/dL (ref 8.7–10.3)
Chloride: 101 mmol/L (ref 96–106)
Creatinine, Ser: 1.09 mg/dL — ABNORMAL HIGH (ref 0.57–1.00)
Globulin, Total: 2.2 g/dL (ref 1.5–4.5)
Glucose: 83 mg/dL (ref 70–99)
Potassium: 3.9 mmol/L (ref 3.5–5.2)
Sodium: 142 mmol/L (ref 134–144)
Total Protein: 6.3 g/dL (ref 6.0–8.5)
eGFR: 51 mL/min/{1.73_m2} — ABNORMAL LOW (ref >59)

## 2022-09-06 LAB — LIPID PANEL
Chol/HDL Ratio: 1.7 ratio (ref 0.0–4.4)
Cholesterol, Total: 116 mg/dL (ref 100–199)
HDL: 67 mg/dL (ref >39)
LDL Chol Calc (NIH): 36 mg/dL (ref 0–99)
Triglycerides: 59 mg/dL (ref 0–149)
VLDL Cholesterol Cal: 13 mg/dL (ref 5–40)

## 2022-10-08 NOTE — Progress Notes (Deleted)
Referring Provider: Bennie Pierini, * Primary Care Physician:  Bennie Pierini, FNP Primary GI Physician: Dr. Bonnetta Barry chief complaint on file.   HPI:   Teresa Holloway is a 82 y.o. female presenting today with a history of   Past Medical History:  Diagnosis Date   Anxiety    Arthritis    Knees, hips   CAD (coronary artery disease)    Chronic back pain    Depression    GERD (gastroesophageal reflux disease)    Hyperlipidemia    Hypertension    MI (myocardial infarction) (HCC)    Osteopenia     Past Surgical History:  Procedure Laterality Date   BIOPSY  07/16/2021   Procedure: BIOPSY;  Surgeon: Lanelle Bal, DO;  Location: [facility];  Service: Endoscopy;;   COLONOSCOPY WITH PROPOFOL N/A 07/16/2021   Procedure: COLONOSCOPY WITH PROPOFOL;  Surgeon: Lanelle Bal, DO;  Location: [facility];  Service: Endoscopy;  Laterality: N/A;  10:30am   ECTOPIC PREGNANCY SURGERY     ESOPHAGOGASTRODUODENOSCOPY (EGD) WITH PROPOFOL N/A 07/16/2021   Procedure: ESOPHAGOGASTRODUODENOSCOPY (EGD) WITH PROPOFOL;  Surgeon: Lanelle Bal, DO;  Location: [facility];  Service: Endoscopy;  Laterality: N/A;   left breast biospy     left wrist fracture     TOTAL KNEE ARTHROPLASTY Left 01/25/2019   Procedure: LEFT TOTAL KNEE ARTHROPLASTY;  Surgeon: Gean Birchwood, MD;  Location: [facility];  Service: Orthopedics;  Laterality: Left;    Current Outpatient Medications  Medication Sig Dispense Refill   alendronate (FOSAMAX) 70 MG tablet TAKE 1 TABLE BY MOUTH EVERY 7 DAYS. TAKE WITH A FULL GLASS OF WATER ON AN EMPTY STOMACH. 12 tablet 1   amLODipine (NORVASC) 5 MG tablet Take 1 tablet (5 mg total) by mouth daily. 90 tablet 1   aspirin EC 81 MG tablet Take 1 tablet (81 mg total) by mouth 2 (two) times daily. (Patient taking differently: Take 81 mg by mouth daily as needed.) 60 tablet 0   atorvastatin (LIPITOR) 40 MG tablet TAKE 1 TABLET BY MOUTH DAILY AT 6 PM. 90 tablet 1    benazepril (LOTENSIN) 40 MG tablet Take 1 tablet (40 mg total) by mouth daily. 90 tablet 1   cholecalciferol (VITAMIN D) 1000 UNITS tablet Take 1,000 Units by mouth daily.     diclofenac sodium (VOLTAREN) 1 % GEL Apply 2 g topically 4 (four) times daily. (Patient taking differently: Apply 2 g topically 4 (four) times daily as needed (pain).) 500 g 1   escitalopram (LEXAPRO) 20 MG tablet Take 1 tablet (20 mg total) by mouth daily. 90 tablet 1   hydrochlorothiazide (HYDRODIURIL) 25 MG tablet Take 1 tablet (25 mg total) by mouth daily. 90 tablet 1   isosorbide mononitrate (IMDUR) 60 MG 24 hr tablet TAKE 1 TABLET BY MOUTH EVERYDAY AT BEDTIME 90 tablet 1   latanoprost (XALATAN) 0.005 % ophthalmic solution 1 drop at bedtime.     metoprolol succinate (TOPROL-XL) 25 MG 24 hr tablet Take 1 tablet (25 mg total) by mouth daily. 30 tablet 11   nitroGLYCERIN (NITROSTAT) 0.4 MG SL tablet Place 1 tablet (0.4 mg total) under the tongue every 5 (five) minutes as needed for chest pain. 30 tablet 1   pantoprazole (PROTONIX) 40 MG tablet Take 40 mg by mouth 2 (two) times daily.     polyethylene glycol powder (GLYCOLAX/MIRALAX) 17 GM/SCOOP powder Take by mouth. 17 grams oral daily for constipation.     potassium chloride (  KLOR-CON M10) 10 MEQ tablet Take 1 tablet (10 mEq total) by mouth 2 (two) times daily. 180 tablet 1   traMADol (ULTRAM) 50 MG tablet Take 1 tablet (50 mg total) by mouth 2 (two) times daily. 60 tablet 2   No current facility-administered medications for this visit.    Allergies as of 10/10/2022 - Review Complete 09/05/2022  Allergen Reaction Noted   Atarax [hydroxyzine] Nausea And Vomiting 08/10/2012   Bactrim [sulfamethoxazole-trimethoprim] Nausea Only 03/23/2015   Coreg [carvedilol] Other (See Comments) 08/10/2012    Family History  Problem Relation Age of Onset   Asthma Father    Diabetes Sister    Diabetes Brother     Social History   Socioeconomic History   Marital status: Widowed     Spouse name: [name]   Number of children: 1   Years of education: Not on file   Highest education level: 10th grade  Occupational History   Occupation: Retired  Tobacco Use   Smoking status: Never   Smokeless tobacco: Never  Vaping Use   Vaping Use: Never used  Substance and Sexual Activity   Alcohol use: No   Drug use: No   Sexual activity: Not Currently    Birth control/protection: Post-menopausal  Other Topics Concern   Not on file  Social History Narrative   Husband passed 12/2020.    1 son-[name].   2 grandchildren   Twin great grandchildren.   Social Determinants of Health   Financial Resource Strain: Low Risk  (08/21/2022)   Overall Financial Resource Strain (CARDIA)    Difficulty of Paying Living Expenses: Not hard at all  Food Insecurity: No Food Insecurity (08/21/2022)   Hunger Vital Sign    Worried About Running Out of Food in the Last Year: Never true    Ran Out of Food in the Last Year: Never true  Transportation Needs: No Transportation Needs (08/21/2022)   PRAPARE - Administrator, Civil Service (Medical): No    Lack of Transportation (Non-Medical): No  Physical Activity: Sufficiently Active (08/21/2022)   Exercise Vital Sign    Days of Exercise per Week: 5 days    Minutes of Exercise per Session: 30 min  Stress: No Stress Concern Present (08/21/2022)   [employer] of Occupational Health - Occupational Stress Questionnaire    Feeling of Stress : Not at all  Social Connections: Moderately Isolated (08/21/2022)   Social Connection and Isolation Panel [NHANES]    Frequency of Communication with Friends and Family: More than three times a week    Frequency of Social Gatherings with Friends and Family: More than three times a week    Attends Religious Services: 1 to 4 times per year    Active Member of [employer] or Organizations: No    Attends Banker Meetings: Never    Marital Status: Widowed    Review of Systems: Gen: Denies fever,  chills, cold or flu like symptoms, pre-syncope, or syncope.  CV: Denies chest pain, palpitations. Resp: Denies dyspnea, cough.  GI: See HPI Heme: See HPI  Physical Exam: There were no vitals taken for this visit. General:   Alert and oriented. No distress noted. Pleasant and cooperative.  Head:  Normocephalic and atraumatic. Eyes:  Conjuctiva clear without scleral icterus. Heart:  S1, S2 present without murmurs appreciated. Lungs:  Clear to auscultation bilaterally. No wheezes, rales, or rhonchi. No distress.  Abdomen:  +BS, soft, non-tender and non-distended. No rebound or guarding. No HSM or masses noted.  Msk:  Symmetrical without gross deformities. Normal posture. Extremities:  Without edema. Neurologic:  Alert and  oriented x4 Psych:  Normal mood and affect.    Assessment:     Plan:  ***   Ermalinda Memos, PA-C [facility] 10/10/2022

## 2022-10-10 ENCOUNTER — Ambulatory Visit: Payer: Medicare Other | Admitting: Gastroenterology

## 2022-10-20 NOTE — Progress Notes (Signed)
GI Office Note    Referring Provider: Bennie Pierini, * Primary Care Physician:  Bennie Pierini, FNP  Primary Gastroenterologist: [name]. [name], DO   Chief Complaint   Chief Complaint  Patient presents with   Dark Stringy Stools    Thinks H Pylori is back    History of Present Illness   Teresa Holloway is a 82 y.o. female presenting today for follow up. Last seen in 07/2022.   H/o chronic constipation, H. pylori gastritis status posttreatment (confirmed eradication by stool antigen X 2), anemia. Labs from July 2023 showed stable hemoglobin (10.4), B12 low normal, folate normal, iron normal. Recommended to take over-the-counter vitamin B12 1000 mcg daily.  Repeat labs in October to May 23 showed B12 normalized, normal ferritin and hemoglobin.  Labs 08/2022 with hemoglobin of 11.0, slightly low for lab reference value.     She has had testing twice since H.pylori treatment with negative results. Last time in 05/2022.   Today: concerned H.pylori back. Similar symptoms as before. Stomach rolls/growls, seemed to improve after H.pylori treatment. No abdominal pain. Sometimes with stomach rolling/growling, she will have BM. No urgency. Usually stool once daily. No brbpr. Sometimes stools dark. Has some dark specks, small. ?something she ate. Stools soft. No heartburn. No N/V. No dysphagia. No weight loss. Appetite good. Pantoprazole BID. Takes Miralax every night.   Colonoscopy March 2023: - Non-bleeding internal hemorrhoids. - The examination was otherwise normal. - No specimens collected.   EGD March 2023: - Z-line regular, 39 cm from the incisors. - Gastritis. Biopsied. - Normal duodenal bulb, first portion of the duodenum and second portion of the duodenum. - Small hiatal hernia. -Gastric biopsy showed H. pylori associated gastritis.  Patient treated with clarithromycin and amoxicillin along with pantoprazole 40 mg twice daily.   Medications   Current  Outpatient Medications  Medication Sig Dispense Refill   alendronate (FOSAMAX) 70 MG tablet TAKE 1 TABLE BY MOUTH EVERY 7 DAYS. TAKE WITH A FULL GLASS OF WATER ON AN EMPTY STOMACH. 12 tablet 1   amLODipine (NORVASC) 5 MG tablet Take 1 tablet (5 mg total) by mouth daily. 90 tablet 1   aspirin EC 81 MG tablet Take 1 tablet (81 mg total) by mouth 2 (two) times daily. (Patient taking differently: Take 81 mg by mouth daily as needed.) 60 tablet 0   atorvastatin (LIPITOR) 40 MG tablet TAKE 1 TABLET BY MOUTH DAILY AT 6 PM. 90 tablet 1   benazepril (LOTENSIN) 40 MG tablet Take 1 tablet (40 mg total) by mouth daily. 90 tablet 1   cholecalciferol (VITAMIN D) 1000 UNITS tablet Take 1,000 Units by mouth daily.     diclofenac sodium (VOLTAREN) 1 % GEL Apply 2 g topically 4 (four) times daily. (Patient taking differently: Apply 2 g topically 4 (four) times daily as needed (pain).) 500 g 1   escitalopram (LEXAPRO) 20 MG tablet Take 1 tablet (20 mg total) by mouth daily. 90 tablet 1   hydrochlorothiazide (HYDRODIURIL) 25 MG tablet Take 1 tablet (25 mg total) by mouth daily. 90 tablet 1   isosorbide mononitrate (IMDUR) 60 MG 24 hr tablet TAKE 1 TABLET BY MOUTH EVERYDAY AT BEDTIME 90 tablet 1   latanoprost (XALATAN) 0.005 % ophthalmic solution 1 drop at bedtime.     metoprolol succinate (TOPROL-XL) 25 MG 24 hr tablet Take 1 tablet (25 mg total) by mouth daily. 30 tablet 11   nitroGLYCERIN (NITROSTAT) 0.4 MG SL tablet Place 1 tablet (0.4 mg  total) under the tongue every 5 (five) minutes as needed for chest pain. 30 tablet 1   pantoprazole (PROTONIX) 40 MG tablet Take 40 mg by mouth 2 (two) times daily.     polyethylene glycol powder (GLYCOLAX/MIRALAX) 17 GM/SCOOP powder Take by mouth. 17 grams oral daily for constipation.     potassium chloride (KLOR-CON M10) 10 MEQ tablet Take 1 tablet (10 mEq total) by mouth 2 (two) times daily. 180 tablet 1   traMADol (ULTRAM) 50 MG tablet Take 1 tablet (50 mg total) by mouth 2  (two) times daily. 60 tablet 2   No current facility-administered medications for this visit.    Allergies   Allergies as of 10/21/2022 - Review Complete 10/21/2022  Allergen Reaction Noted   Atarax [hydroxyzine] Nausea And Vomiting 08/10/2012   Bactrim [sulfamethoxazole-trimethoprim] Nausea Only 03/23/2015   Coreg [carvedilol] Other (See Comments) 08/10/2012       Review of Systems   General: Negative for anorexia, weight loss, fever, chills, fatigue, weakness. ENT: Negative for hoarseness, difficulty swallowing , nasal congestion. CV: Negative for chest pain, angina, palpitations, dyspnea on exertion, peripheral edema.  Respiratory: Negative for dyspnea at rest, dyspnea on exertion, cough, sputum, wheezing.  GI: See history of present illness. GU:  Negative for dysuria, hematuria, urinary incontinence, urinary frequency, nocturnal urination.  Endo: Negative for unusual weight change.     Physical Exam   BP 118/68 (BP Location: Right Arm, Patient Position: Sitting, Cuff Size: Normal)   Pulse 71   Temp 97.9 F (36.6 C) (Oral)   Ht 5\' 5"  (1.651 m)   Wt 141 lb (64 kg)   SpO2 95%   BMI 23.46 kg/m    General: Well-nourished, well-developed in no acute distress.  Eyes: No icterus. Mouth: Oropharyngeal mucosa moist and pink   Abdomen: Bowel sounds are normal, nontender, nondistended, no hepatosplenomegaly or masses,  no abdominal bruits or hernia , no rebound or guarding.  Rectal: not performed Extremities: No lower extremity edema. No clubbing or deformities. Neuro: Alert and oriented x 4   Skin: Warm and dry, no jaundice.   Psych: Alert and cooperative, normal mood and affect.  Labs   Lab Results  Component Value Date   CREATININE 1.09 (H) 09/05/2022   BUN 17 09/05/2022   NA 142 09/05/2022   K 3.9 09/05/2022   CL 101 09/05/2022   CO2 27 09/05/2022   Lab Results  Component Value Date   ALT 10 09/05/2022   AST 14 09/05/2022   ALKPHOS 101 09/05/2022   BILITOT  0.9 09/05/2022   Lab Results  Component Value Date   WBC 5.4 09/05/2022   HGB 11.0 (L) 09/05/2022   HCT 34.2 09/05/2022   MCV 93 09/05/2022   PLT 217 09/05/2022   Lab Results  Component Value Date   IRON 74 10/31/2021   TIBC 376 10/31/2021   FERRITIN 35 02/11/2022   Lab Results  Component Value Date   VITAMINB12 706 02/11/2022   Lab Results  Component Value Date   FOLATE 16.7 10/31/2021    Imaging Studies   No results found.  Assessment   GERD: controlled, start weaning back on PPI to once daily dosing  H.pylori: confirmed eradication twice. Her growling stomach noise is likely unrelated to H.pylori. she denies any abdominal pain, appetite is good. She insists to be rechecked.  Constipation: doing well on Miralax  Anemia: slightly low Hgb with normal Hct. Overall stable. EGD/colonoscopy up to date. Previously low normal ferritin and B12, provided  supplement last year.    PLAN   Patient to share medication list we have on file with her son to make sure accurate. They will call if medications don't match. Stop iron for now if you are on it. Complete ifbot.  Complete H.pylori stool antigen test. Se will need to hold pantoprazole for two weeks before collecting.  Plan to reduce pantoprazole to once daily. Add probiotic once daily to see if helps with stomach growling.    Leanna Battles. Melvyn Neth, MHS, PA-C [facility]

## 2022-10-21 ENCOUNTER — Ambulatory Visit: Payer: Medicare Other | Admitting: Gastroenterology

## 2022-10-21 ENCOUNTER — Encounter: Payer: Self-pay | Admitting: Gastroenterology

## 2022-10-21 ENCOUNTER — Telehealth: Payer: Self-pay | Admitting: Nurse Practitioner

## 2022-10-21 VITALS — BP 118/68 | HR 71 | Temp 97.9°F | Ht 65.0 in | Wt 141.0 lb

## 2022-10-21 DIAGNOSIS — B9681 Helicobacter pylori [H. pylori] as the cause of diseases classified elsewhere: Secondary | ICD-10-CM

## 2022-10-21 DIAGNOSIS — K2970 Gastritis, unspecified, without bleeding: Secondary | ICD-10-CM | POA: Diagnosis not present

## 2022-10-21 DIAGNOSIS — K219 Gastro-esophageal reflux disease without esophagitis: Secondary | ICD-10-CM

## 2022-10-21 DIAGNOSIS — K5900 Constipation, unspecified: Secondary | ICD-10-CM | POA: Diagnosis not present

## 2022-10-21 DIAGNOSIS — D649 Anemia, unspecified: Secondary | ICD-10-CM | POA: Diagnosis not present

## 2022-10-21 NOTE — Telephone Encounter (Signed)
Please double check. I don't see anything containing iron on her list

## 2022-10-21 NOTE — Patient Instructions (Addendum)
Please share these papers with your son so that he can double check that what you are taking matches our medication list. If they don't match, please let us know. Please let your son know, that for now I would like you to STOP iron if you are taking any. You were given a stool TUBE to check for blood in your stool. Once you collect stool sample please return to OUR OFFICE. Please go to [facility] at your PCP's office, take the orders provided today. You will get a stool container to check for H.pylori infection, this container needs to be returned to the lab at your PCP's office.  Please REDUCE pantoprazole to once daily before breakfast.  For the growling/noise in your stomach I would like for you to add a good probiotic once daily. You can try Align, [employer], or other probiotic you can find over the counter.

## 2022-10-21 NOTE — Telephone Encounter (Signed)
Patients daughter notified and verbalized understanding 

## 2022-10-21 NOTE — Telephone Encounter (Signed)
No iron in her meds

## 2022-10-22 ENCOUNTER — Other Ambulatory Visit: Payer: Self-pay

## 2022-10-22 ENCOUNTER — Ambulatory Visit (INDEPENDENT_AMBULATORY_CARE_PROVIDER_SITE_OTHER): Payer: Medicare Other | Admitting: Gastroenterology

## 2022-10-22 DIAGNOSIS — D509 Iron deficiency anemia, unspecified: Secondary | ICD-10-CM

## 2022-10-22 DIAGNOSIS — K921 Melena: Secondary | ICD-10-CM

## 2022-10-22 LAB — IFOBT (OCCULT BLOOD): IFOBT: NEGATIVE

## 2022-10-25 ENCOUNTER — Other Ambulatory Visit: Payer: Medicare Other

## 2022-10-25 DIAGNOSIS — K2970 Gastritis, unspecified, without bleeding: Secondary | ICD-10-CM | POA: Diagnosis not present

## 2022-10-25 DIAGNOSIS — D649 Anemia, unspecified: Secondary | ICD-10-CM | POA: Diagnosis not present

## 2022-10-25 DIAGNOSIS — B9681 Helicobacter pylori [H. pylori] as the cause of diseases classified elsewhere: Secondary | ICD-10-CM | POA: Diagnosis not present

## 2022-10-25 DIAGNOSIS — K5900 Constipation, unspecified: Secondary | ICD-10-CM | POA: Diagnosis not present

## 2022-10-25 DIAGNOSIS — K219 Gastro-esophageal reflux disease without esophagitis: Secondary | ICD-10-CM | POA: Diagnosis not present

## 2022-10-26 ENCOUNTER — Telehealth: Payer: Self-pay | Admitting: Gastroenterology

## 2022-10-26 NOTE — Telephone Encounter (Signed)
Teresa Holloway, can we make sure patient knows to stop pantoprazole for two weeks prior to collecting h.pylori stool antigen.   Also can we have her update labs, CBC, iron/tibc/ferritn.

## 2022-10-27 LAB — H. PYLORI ANTIGEN, STOOL: H pylori Ag, Stl: NEGATIVE

## 2022-10-28 ENCOUNTER — Telehealth: Payer: Self-pay | Admitting: Gastroenterology

## 2022-10-28 ENCOUNTER — Other Ambulatory Visit: Payer: Self-pay

## 2022-10-28 DIAGNOSIS — B9681 Helicobacter pylori [H. pylori] as the cause of diseases classified elsewhere: Secondary | ICD-10-CM

## 2022-10-28 DIAGNOSIS — D509 Iron deficiency anemia, unspecified: Secondary | ICD-10-CM

## 2022-10-28 NOTE — Telephone Encounter (Signed)
Pt already submitted stool, pt was advised to hold it for two weeks and that it would be reordered along with the other recommended labs. Pt verbalized understanding. Lab orders were faxed to [facility] per pt's request.

## 2022-10-28 NOTE — Telephone Encounter (Signed)
See other note

## 2022-10-28 NOTE — Telephone Encounter (Signed)
Patient left a message saying someone just called her and I looked in notes and saw that you had called

## 2022-10-28 NOTE — Telephone Encounter (Signed)
No ans, vm full.  

## 2022-10-30 ENCOUNTER — Other Ambulatory Visit: Payer: Medicare Other

## 2022-10-30 DIAGNOSIS — B9681 Helicobacter pylori [H. pylori] as the cause of diseases classified elsewhere: Secondary | ICD-10-CM | POA: Diagnosis not present

## 2022-10-30 DIAGNOSIS — K2970 Gastritis, unspecified, without bleeding: Secondary | ICD-10-CM | POA: Diagnosis not present

## 2022-10-30 DIAGNOSIS — D509 Iron deficiency anemia, unspecified: Secondary | ICD-10-CM | POA: Diagnosis not present

## 2022-10-31 LAB — CBC WITH DIFFERENTIAL/PLATELET
Basophils Absolute: 0.1 10*3/uL (ref 0.0–0.2)
Basos: 2 %
EOS (ABSOLUTE): 0.2 10*3/uL (ref 0.0–0.4)
Eos: 5 %
Hematocrit: 33.9 % — ABNORMAL LOW (ref 34.0–46.6)
Hemoglobin: 11.2 g/dL (ref 11.1–15.9)
Immature Grans (Abs): 0 10*3/uL (ref 0.0–0.1)
Immature Granulocytes: 0 %
Lymphocytes Absolute: 1.4 10*3/uL (ref 0.7–3.1)
Lymphs: 33 %
MCH: 30.2 pg (ref 26.6–33.0)
MCHC: 33 g/dL (ref 31.5–35.7)
MCV: 91 fL (ref 79–97)
Monocytes Absolute: 0.5 10*3/uL (ref 0.1–0.9)
Monocytes: 12 %
Neutrophils Absolute: 2.1 10*3/uL (ref 1.4–7.0)
Neutrophils: 48 %
Platelets: 226 10*3/uL (ref 150–450)
RBC: 3.71 x10E6/uL — ABNORMAL LOW (ref 3.77–5.28)
RDW: 12.1 % (ref 11.7–15.4)
WBC: 4.3 10*3/uL (ref 3.4–10.8)

## 2022-10-31 LAB — IRON,TIBC AND FERRITIN PANEL
Ferritin: 35 ng/mL (ref 15–150)
Iron Saturation: 29 % (ref 15–55)
Iron: 99 ug/dL (ref 27–139)
Total Iron Binding Capacity: 343 ug/dL (ref 250–450)
UIBC: 244 ug/dL (ref 118–369)

## 2022-11-01 ENCOUNTER — Other Ambulatory Visit: Payer: Self-pay

## 2022-11-01 DIAGNOSIS — D509 Iron deficiency anemia, unspecified: Secondary | ICD-10-CM

## 2022-11-01 LAB — H. PYLORI ANTIGEN, STOOL: H pylori Ag, Stl: NEGATIVE

## 2022-11-17 ENCOUNTER — Other Ambulatory Visit: Payer: Self-pay | Admitting: Nurse Practitioner

## 2022-11-17 DIAGNOSIS — M1612 Unilateral primary osteoarthritis, left hip: Secondary | ICD-10-CM

## 2022-12-16 ENCOUNTER — Ambulatory Visit (INDEPENDENT_AMBULATORY_CARE_PROVIDER_SITE_OTHER): Payer: Medicare Other | Admitting: Nurse Practitioner

## 2022-12-16 ENCOUNTER — Encounter: Payer: Self-pay | Admitting: Nurse Practitioner

## 2022-12-16 VITALS — BP 125/67 | HR 54 | Temp 97.3°F | Resp 20 | Ht 65.0 in | Wt 141.0 lb

## 2022-12-16 DIAGNOSIS — Z79891 Long term (current) use of opiate analgesic: Secondary | ICD-10-CM | POA: Diagnosis not present

## 2022-12-16 DIAGNOSIS — M1612 Unilateral primary osteoarthritis, left hip: Secondary | ICD-10-CM

## 2022-12-16 MED ORDER — TRAMADOL HCL 50 MG PO TABS
50.0000 mg | ORAL_TABLET | Freq: Two times a day (BID) | ORAL | 2 refills | Status: AC
Start: 2022-12-16 — End: 2023-03-16

## 2022-12-16 NOTE — Progress Notes (Signed)
Subjective:    Patient ID: Teresa Holloway, female    DOB: 1940-11-22, 82 y.o.   MRN: [id number]   Chief Complaint: pain management  HPI  Patient has severe osteoarthritis of hip and needs total hip replacement but her family does not want her to have surgery. Pain assessment: Cause of pain- OA of hip Pain location- right hip Pain on scale of 1-10- 7-8/10 Frequency- daily What increases pain-nothing What makes pain Better- rest helps Effects on ADL - none Any change in general medical condition-none  Current opioids rx- ultram # meds rx- 60 Effectiveness of current meds-helps Adverse reactions from pain meds-none Morphine equivalent-  Pill count performed-No Last drug screen - never ( high risk q8m, moderate risk q55m, low risk yearly ) Urine drug screen today- Yes Was the [facility] reviewed- yes  If yes were their any concerning findings? - no   Overdose risk: 1    06/06/2022   10:33 AM  Opioid Risk   Alcohol 0  Illegal Drugs 0  Rx Drugs 0  Alcohol 0  Illegal Drugs 0  Rx Drugs 0  Age between 16-45 years  0  History of Preadolescent Sexual Abuse 0  Psychological Disease 0  Depression 0  Opioid Risk Tool Scoring 0  Opioid Risk Interpretation Low Risk     Pain contract signed on:12/12/21  Patient Active Problem List   Diagnosis Date Noted   Hypokalemia 09/05/2022   Anemia 02/05/2022   Constipation 10/29/2021   Helicobacter pylori gastritis 10/29/2021   Status post total left knee replacement 01/25/2019   Pain management contract agreement 02/22/2016   Opioid type dependence, continuous (HCC) 02/22/2016   BMI 30.0-30.9,adult 01/02/2015   Osteoarthritis of left hip 08/23/2013   Depression 08/23/2013   Osteoporosis 06/23/2013   Glaucoma of right eye 06/23/2013   CAD (coronary artery disease) 08/19/2012   History of MI (myocardial infarction) 08/19/2012   Arthritis of both knees 08/19/2012   Essential hypertension, benign 08/10/2012    Hyperlipidemia with target LDL less than 100 08/10/2012   GERD (gastroesophageal reflux disease) 08/10/2012       Review of Systems     Objective:   Physical Exam Constitutional:      Appearance: Normal appearance.  Cardiovascular:     Rate and Rhythm: Normal rate and regular rhythm.     Heart sounds: Normal heart sounds.  Pulmonary:     Effort: Pulmonary effort is normal.  Musculoskeletal:     Comments: Limping on right hip  Skin:    General: Skin is warm.  Neurological:     General: No focal deficit present.     Mental Status: She is alert and oriented to person, place, and time.  Psychiatric:        Mood and Affect: Mood normal.    BP 125/67   Pulse (!) 54   Temp (!) 97.3 F (36.3 C) (Temporal)   Resp 20   Ht 5\' 5"  (1.651 m)   Wt 141 lb (64 kg)   SpO2 96%   BMI 23.46 kg/m         Assessment & Plan:  Teresa Holloway in today with chief complaint of Pain Management   1. Primary osteoarthritis of left hip Rest Has appointment with ortho to discuss hip replacemnet - traMADol (ULTRAM) 50 MG tablet; Take 1 tablet (50 mg total) by mouth 2 (two) times daily.  Dispense: 60 tablet; Refill: 2 - ToxASSURE Select 13 (MW), Urine    The  above assessment and management plan was discussed with the patient. The patient verbalized understanding of and has agreed to the management plan. Patient is aware to call the clinic if symptoms persist or worsen. Patient is aware when to return to the clinic for a follow-up visit. Patient educated on when it is appropriate to go to the emergency department.   Mary-Margaret Daphine Deutscher, FNP

## 2022-12-18 LAB — TOXASSURE SELECT 13 (MW), URINE

## 2022-12-24 ENCOUNTER — Telehealth: Payer: Self-pay | Admitting: Nurse Practitioner

## 2022-12-24 DIAGNOSIS — M1712 Unilateral primary osteoarthritis, left knee: Secondary | ICD-10-CM | POA: Diagnosis not present

## 2022-12-24 DIAGNOSIS — M1611 Unilateral primary osteoarthritis, right hip: Secondary | ICD-10-CM | POA: Diagnosis not present

## 2022-12-24 DIAGNOSIS — M1711 Unilateral primary osteoarthritis, right knee: Secondary | ICD-10-CM | POA: Diagnosis not present

## 2022-12-24 NOTE — Telephone Encounter (Signed)
Pts daughter called requesting to speak with MMM nurse regarding [facility]. Says they dont send patients to rehab. They send them home after surgery. Daughter needs advise on what to do. Are there any resources for pt?

## 2022-12-24 NOTE — Telephone Encounter (Signed)
Are we able to help the patient get in to a rehab facility after her surgery? Daughter says ortho will not do that.

## 2022-12-26 NOTE — Telephone Encounter (Signed)
Daughter says ortho will send her to outpatient rehab but patients family would like for her to be placed in a facility for at least 2 weeks for therapy. They state that she had surgery a couple years ago and refused to let the therapist come in her home and also refused to have rehab at the office in front of [place] and things did not go well. Is this something we can help with?

## 2022-12-30 NOTE — Telephone Encounter (Signed)
Spoke with daughter and advised that if they find a place that will accept her after surgery that we will help with paperwork and placement. Daughter verbalized understanding

## 2023-01-02 ENCOUNTER — Encounter: Payer: Self-pay | Admitting: Nurse Practitioner

## 2023-01-02 ENCOUNTER — Ambulatory Visit (INDEPENDENT_AMBULATORY_CARE_PROVIDER_SITE_OTHER): Payer: Medicare Other | Admitting: Nurse Practitioner

## 2023-01-02 VITALS — BP 131/64 | HR 53 | Temp 97.3°F | Ht 65.0 in | Wt 140.0 lb

## 2023-01-02 DIAGNOSIS — L989 Disorder of the skin and subcutaneous tissue, unspecified: Secondary | ICD-10-CM

## 2023-01-02 DIAGNOSIS — Z23 Encounter for immunization: Secondary | ICD-10-CM

## 2023-01-02 NOTE — Progress Notes (Signed)
Subjective:    Patient ID: Teresa Holloway, female    DOB: 07/22/1940, 82 y.o.   MRN: [id number]   Chief Complaint: Remove skin lesions from arm (Left arm/)   HPI Patient has a slightly raised black skin lesion on left wrist. Has been there for years. Has not changed in size or color. Patient Active Problem List   Diagnosis Date Noted   Hypokalemia 09/05/2022   Anemia 02/05/2022   Constipation 10/29/2021   Helicobacter pylori gastritis 10/29/2021   Status post total left knee replacement 01/25/2019   Pain management contract agreement 02/22/2016   Opioid type dependence, continuous (HCC) 02/22/2016   BMI 30.0-30.9,adult 01/02/2015   Osteoarthritis of left hip 08/23/2013   Depression 08/23/2013   Osteoporosis 06/23/2013   Glaucoma of right eye 06/23/2013   CAD (coronary artery disease) 08/19/2012   History of MI (myocardial infarction) 08/19/2012   Arthritis of both knees 08/19/2012   Essential hypertension, benign 08/10/2012   Hyperlipidemia with target LDL less than 100 08/10/2012   GERD (gastroesophageal reflux disease) 08/10/2012       Review of Systems  Constitutional:  Negative for diaphoresis.  Eyes:  Negative for pain.  Respiratory:  Negative for shortness of breath.   Cardiovascular:  Negative for chest pain, palpitations and leg swelling.  Gastrointestinal:  Negative for abdominal pain.  Endocrine: Negative for polydipsia.  Skin:  Negative for rash.  Neurological:  Negative for dizziness, weakness and headaches.  Hematological:  Does not bruise/bleed easily.  All other systems reviewed and are negative.      Objective:   Physical Exam Constitutional:      Appearance: Normal appearance.  Cardiovascular:     Rate and Rhythm: Normal rate and regular rhythm.     Heart sounds: Normal heart sounds.  Pulmonary:     Effort: Pulmonary effort is normal.     Breath sounds: Normal breath sounds.  Skin:    Comments: 4cm annular black slightly raised skin  lesion to left wrist  Neurological:     General: No focal deficit present.     Mental Status: She is alert and oriented to person, place, and time.  Psychiatric:        Mood and Affect: Mood normal.        Behavior: Behavior normal.    Skin excision  Date/Time: 01/02/2023 11:29 AM  Performed by: Bennie Pierini, FNP Authorized by: Daphine Deutscher Mary-Margaret, FNP   Number of Lesions: 1 Lesion 1:    Body area: upper extremity   Upper extremity location: L wrist   Initial size (mm): 4   Final defect size (mm): 4   Malignancy: benign lesion     Destruction method: scissors used for extraction           Assessment & Plan:  Teresa Holloway in today with chief complaint of Remove skin lesions from arm (Left arm/)   1. Encounter for removal of skin lesion Keep clean and dry Change dressing tomorrow RTO prn    The above assessment and management plan was discussed with the patient. The patient verbalized understanding of and has agreed to the management plan. Patient is aware to call the clinic if symptoms persist or worsen. Patient is aware when to return to the clinic for a follow-up visit. Patient educated on when it is appropriate to go to the emergency department.   Mary-Margaret Daphine Deutscher, FNP

## 2023-01-09 DIAGNOSIS — L0390 Cellulitis, unspecified: Secondary | ICD-10-CM | POA: Diagnosis not present

## 2023-01-10 ENCOUNTER — Ambulatory Visit: Payer: Medicare Other

## 2023-01-13 DIAGNOSIS — S8010XA Contusion of unspecified lower leg, initial encounter: Secondary | ICD-10-CM | POA: Diagnosis not present

## 2023-01-13 DIAGNOSIS — M79605 Pain in left leg: Secondary | ICD-10-CM | POA: Diagnosis not present

## 2023-01-13 DIAGNOSIS — X58XXXA Exposure to other specified factors, initial encounter: Secondary | ICD-10-CM | POA: Diagnosis not present

## 2023-01-13 DIAGNOSIS — E785 Hyperlipidemia, unspecified: Secondary | ICD-10-CM | POA: Diagnosis not present

## 2023-01-13 DIAGNOSIS — I10 Essential (primary) hypertension: Secondary | ICD-10-CM | POA: Diagnosis not present

## 2023-01-13 DIAGNOSIS — S8012XA Contusion of left lower leg, initial encounter: Secondary | ICD-10-CM | POA: Diagnosis not present

## 2023-01-15 ENCOUNTER — Other Ambulatory Visit: Payer: Self-pay | Admitting: Orthopedic Surgery

## 2023-01-17 ENCOUNTER — Ambulatory Visit: Payer: Medicare Other | Admitting: Nurse Practitioner

## 2023-01-17 DIAGNOSIS — E878 Other disorders of electrolyte and fluid balance, not elsewhere classified: Secondary | ICD-10-CM | POA: Diagnosis not present

## 2023-01-17 DIAGNOSIS — S8012XA Contusion of left lower leg, initial encounter: Secondary | ICD-10-CM | POA: Diagnosis not present

## 2023-01-17 DIAGNOSIS — I10 Essential (primary) hypertension: Secondary | ICD-10-CM | POA: Diagnosis not present

## 2023-01-17 DIAGNOSIS — S8012XS Contusion of left lower leg, sequela: Secondary | ICD-10-CM | POA: Diagnosis not present

## 2023-01-17 DIAGNOSIS — R7989 Other specified abnormal findings of blood chemistry: Secondary | ICD-10-CM | POA: Diagnosis not present

## 2023-01-17 DIAGNOSIS — E785 Hyperlipidemia, unspecified: Secondary | ICD-10-CM | POA: Diagnosis not present

## 2023-01-17 DIAGNOSIS — R600 Localized edema: Secondary | ICD-10-CM | POA: Diagnosis not present

## 2023-01-17 DIAGNOSIS — Z888 Allergy status to other drugs, medicaments and biological substances status: Secondary | ICD-10-CM | POA: Diagnosis not present

## 2023-01-17 DIAGNOSIS — M79605 Pain in left leg: Secondary | ICD-10-CM | POA: Diagnosis not present

## 2023-01-20 ENCOUNTER — Encounter: Payer: Self-pay | Admitting: Nurse Practitioner

## 2023-01-20 ENCOUNTER — Ambulatory Visit (INDEPENDENT_AMBULATORY_CARE_PROVIDER_SITE_OTHER): Payer: Medicare Other | Admitting: Nurse Practitioner

## 2023-01-20 VITALS — BP 95/52 | HR 57 | Temp 97.5°F | Resp 20 | Ht 65.0 in | Wt 137.0 lb

## 2023-01-20 DIAGNOSIS — S8012XA Contusion of left lower leg, initial encounter: Secondary | ICD-10-CM | POA: Diagnosis not present

## 2023-01-20 DIAGNOSIS — Z09 Encounter for follow-up examination after completed treatment for conditions other than malignant neoplasm: Secondary | ICD-10-CM | POA: Diagnosis not present

## 2023-01-20 NOTE — Progress Notes (Signed)
Subjective:    Patient ID: Teresa Holloway, female    DOB: 1940-09-09, 82 y.o.   MRN: [id number]   Chief Complaint: Hospitalization Follow-up   HPI  Patient went to the ED on 01/13/23 and 01/17/23 with leg contusion. Right calf was swelling and bruised and she does not remember an injury. She was checked for blood clot which was negative. Area is some better now.  Patient Active Problem List   Diagnosis Date Noted   Hypokalemia 09/05/2022   Anemia 02/05/2022   Constipation 10/29/2021   Helicobacter pylori gastritis 10/29/2021   Status post total left knee replacement 01/25/2019   Pain management contract agreement 02/22/2016   Opioid type dependence, continuous (HCC) 02/22/2016   BMI 30.0-30.9,adult 01/02/2015   Osteoarthritis of left hip 08/23/2013   Depression 08/23/2013   Osteoporosis 06/23/2013   Glaucoma of right eye 06/23/2013   CAD (coronary artery disease) 08/19/2012   History of MI (myocardial infarction) 08/19/2012   Arthritis of both knees 08/19/2012   Essential hypertension, benign 08/10/2012   Hyperlipidemia with target LDL less than 100 08/10/2012   GERD (gastroesophageal reflux disease) 08/10/2012        Review of Systems  Constitutional:  Negative for diaphoresis.  Eyes:  Negative for pain.  Respiratory:  Negative for shortness of breath.   Cardiovascular:  Negative for chest pain, palpitations and leg swelling.  Gastrointestinal:  Negative for abdominal pain.  Endocrine: Negative for polydipsia.  Skin:  Negative for rash.  Neurological:  Negative for dizziness, weakness and headaches.  Hematological:  Does not bruise/bleed easily.  All other systems reviewed and are negative.      Objective:   Physical Exam Constitutional:      Appearance: Normal appearance.  Cardiovascular:     Rate and Rhythm: Normal rate and regular rhythm.     Heart sounds: Normal heart sounds.  Pulmonary:     Effort: Pulmonary effort is normal.     Breath sounds:  Normal breath sounds.  Musculoskeletal:     Right lower leg: No edema.     Left lower leg: No edema.  Skin:    General: Skin is warm.     Comments: Contusion noted on right ankle. Right calf contusion has resolved.  Neurological:     General: No focal deficit present.     Mental Status: She is alert and oriented to person, place, and time.  Psychiatric:        Mood and Affect: Mood normal.        Behavior: Behavior normal.    BP (!) 95/52   Pulse (!) 57   Temp (!) 97.5 F (36.4 C) (Temporal)   Resp 20   Ht 5\' 5"  (1.651 m)   Wt 137 lb (62.1 kg)   SpO2 96%   BMI 22.80 kg/m         Assessment & Plan:   Teresa Holloway in today with chief complaint of Hospitalization Follow-up   1. Contusion of left lower extremity, initial encounter Watch for changes  2. Hospital discharge follow-up Hospital records reviewed.    The above assessment and management plan was discussed with the patient. The patient verbalized understanding of and has agreed to the management plan. Patient is aware to call the clinic if symptoms persist or worsen. Patient is aware when to return to the clinic for a follow-up visit. Patient educated on when it is appropriate to go to the emergency department.   Mary-Margaret Daphine Deutscher, FNP

## 2023-01-23 DIAGNOSIS — R262 Difficulty in walking, not elsewhere classified: Secondary | ICD-10-CM | POA: Diagnosis not present

## 2023-01-23 DIAGNOSIS — M25651 Stiffness of right hip, not elsewhere classified: Secondary | ICD-10-CM | POA: Diagnosis not present

## 2023-01-23 DIAGNOSIS — M1611 Unilateral primary osteoarthritis, right hip: Secondary | ICD-10-CM | POA: Diagnosis not present

## 2023-01-24 DIAGNOSIS — M1611 Unilateral primary osteoarthritis, right hip: Secondary | ICD-10-CM | POA: Diagnosis not present

## 2023-01-29 ENCOUNTER — Telehealth: Payer: Self-pay | Admitting: Nurse Practitioner

## 2023-01-29 NOTE — Telephone Encounter (Signed)
Left message to contact the office.

## 2023-01-29 NOTE — Telephone Encounter (Signed)
Pt called requesting to speak to nurse. Says she has a procedure coming up and has questions about the infection in her stomach.

## 2023-01-29 NOTE — Telephone Encounter (Signed)
Called and spoke with patient. She states that she has hip surgery coming up and is concerned that her stomach infection has returned. Advised patient to call her GI Dr and gave her the number to contact them. Patient verbalized understanding

## 2023-02-04 NOTE — Patient Instructions (Signed)
SURGICAL WAITING ROOM VISITATION  Patients having surgery or a procedure may have no more than 2 support people in the waiting area - these visitors may rotate.    Children under the age of 62 must have an adult with them who is not the patient.  Due to an increase in RSV and influenza rates and associated hospitalizations, children ages 71 and under may not visit patients in [facility] hospitals.  If the patient needs to stay at the hospital during part of their recovery, the visitor guidelines for inpatient rooms apply. Pre-op nurse will coordinate an appropriate time for 1 support person to accompany patient in pre-op.  This support person may not rotate.    Please refer to the [facility] website for the visitor guidelines for Inpatients (after your surgery is over and you are in a regular room).    Your procedure is scheduled on: 02/17/23   Report to [facility] Main Entrance    Report to admitting at 5:15 AM   Call this number if you have problems the morning of surgery [phone]   Do not eat food :After Midnight.   After Midnight you may have the following liquids until 4:15 AM DAY OF SURGERY  Water Non-Citrus Juices (without pulp, NO RED-Apple, White grape, White cranberry) Black Coffee (NO MILK/CREAM OR CREAMERS, sugar ok)  Clear Tea (NO MILK/CREAM OR CREAMERS, sugar ok) regular and decaf                             Plain Jell-O (NO RED)                                           Fruit ices (not with fruit pulp, NO RED)                                     Popsicles (NO RED)                                                               Sports drinks like Gatorade (NO RED)               The day of surgery:  Drink ONE (1) Pre-Surgery Clear Ensure at 4:15 AM the morning of surgery. Drink in one sitting. Do not sip.  This drink was given to you during your hospital  pre-op appointment visit. Nothing else to drink after completing the  Pre-Surgery Clear Ensure.           If you have questions, please contact your surgeon's office.   FOLLOW BOWEL PREP AND ANY ADDITIONAL PRE OP INSTRUCTIONS YOU RECEIVED FROM YOUR SURGEON'S OFFICE!!!     Oral Hygiene is also important to reduce your risk of infection.                                    Remember - BRUSH YOUR TEETH THE MORNING OF SURGERY WITH YOUR REGULAR TOOTHPASTE  DENTURES WILL BE REMOVED  PRIOR TO SURGERY PLEASE DO NOT APPLY "Poly grip" OR ADHESIVES!!!   Stop all vitamins and herbal supplements 7 days before surgery.   Take these medicines the morning of surgery with A SIP OF WATER: Metoprolol, Tramadol              You may not have any metal on your body including hair pins, jewelry, and body piercing             Do not wear make-up, lotions, powders, perfumes, or deodorant  Do not wear nail polish including gel and S&S, artificial/acrylic nails, or any other type of covering on natural nails including finger and toenails. If you have artificial nails, gel coating, etc. that needs to be removed by a nail salon please have this removed prior to surgery or surgery may need to be canceled/ delayed if the surgeon/ anesthesia feels like they are unable to be safely monitored.   Do not shave  48 hours prior to surgery.    Do not bring valuables to the hospital. [facility] IS NOT             RESPONSIBLE   FOR VALUABLES.   Contacts, glasses, dentures or bridgework may not be worn into surgery.   Bring small overnight bag day of surgery.   DO NOT BRING YOUR HOME MEDICATIONS TO THE HOSPITAL. PHARMACY WILL DISPENSE MEDICATIONS LISTED ON YOUR MEDICATION LIST TO YOU DURING YOUR ADMISSION IN THE HOSPITAL!              Please read over the following fact sheets you were given: IF YOU HAVE QUESTIONS ABOUT YOUR PRE-OP INSTRUCTIONS PLEASE CALL [phone]    If you received a COVID test during your pre-op visit  it is requested that you wear a mask when out in public, stay away from anyone that  may not be feeling well and notify your surgeon if you develop symptoms. If you test positive for Covid or have been in contact with anyone that has tested positive in the last 10 days please notify you surgeon.      Pre-operative 5 CHG Bath Instructions   You can play a key role in reducing the risk of infection after surgery. Your skin needs to be as free of germs as possible. You can reduce the number of germs on your skin by washing with CHG (chlorhexidine gluconate) soap before surgery. CHG is an antiseptic soap that kills germs and continues to kill germs even after washing.   DO NOT use if you have an allergy to chlorhexidine/CHG or antibacterial soaps. If your skin becomes reddened or irritated, stop using the CHG and notify one of our RNs at [phone].   Please shower with the CHG soap starting 4 days before surgery using the following schedule:     Please keep in mind the following:  DO NOT shave, including legs and underarms, starting the day of your first shower.   You may shave your face at any point before/day of surgery.  Place clean sheets on your bed the day you start using CHG soap. Use a clean washcloth (not used since being washed) for each shower. DO NOT sleep with pets once you start using the CHG.   CHG Shower Instructions:  If you choose to wash your hair and private area, wash first with your normal shampoo/soap.  After you use shampoo/soap, rinse your hair and body thoroughly to remove shampoo/soap residue.  Turn the water OFF and apply about  3 tablespoons (45 ml) of CHG soap to a CLEAN washcloth.  Apply CHG soap ONLY FROM YOUR NECK DOWN TO YOUR TOES (washing for 3-5 minutes)  DO NOT use CHG soap on face, private areas, open wounds, or sores.  Pay special attention to the area where your surgery is being performed.  If you are having back surgery, having someone wash your back for you may be helpful. Wait 2 minutes after CHG soap is applied, then you may  rinse off the CHG soap.  Pat dry with a clean towel  Put on clean clothes/pajamas   If you choose to wear lotion, please use ONLY the CHG-compatible lotions on the back of this paper.     Additional instructions for the day of surgery: DO NOT APPLY any lotions, deodorants, cologne, or perfumes.   Put on clean/comfortable clothes.  Brush your teeth.  Ask your nurse before applying any prescription medications to the skin.      CHG Compatible Lotions   Aveeno Moisturizing lotion  Cetaphil Moisturizing Cream  Cetaphil Moisturizing Lotion  Clairol Herbal Essence Moisturizing Lotion, Dry Skin  Clairol Herbal Essence Moisturizing Lotion, Extra Dry Skin  Clairol Herbal Essence Moisturizing Lotion, Normal Skin  Curel Age Defying Therapeutic Moisturizing Lotion with Alpha Hydroxy  Curel Extreme Care Body Lotion  Curel Soothing Hands Moisturizing Hand Lotion  Curel Therapeutic Moisturizing Cream, Fragrance-Free  Curel Therapeutic Moisturizing Lotion, Fragrance-Free  Curel Therapeutic Moisturizing Lotion, Original Formula  Eucerin Daily Replenishing Lotion  Eucerin Dry Skin Therapy Plus Alpha Hydroxy Crme  Eucerin Dry Skin Therapy Plus Alpha Hydroxy Lotion  Eucerin Original Crme  Eucerin Original Lotion  Eucerin Plus Crme Eucerin Plus Lotion  Eucerin TriLipid Replenishing Lotion  Keri Anti-Bacterial Hand Lotion  Keri Deep Conditioning Original Lotion Dry Skin Formula Softly Scented  Keri Deep Conditioning Original Lotion, Fragrance Free Sensitive Skin Formula  Keri Lotion Fast Absorbing Fragrance Free Sensitive Skin Formula  Keri Lotion Fast Absorbing Softly Scented Dry Skin Formula  Keri Original Lotion  Keri Skin Renewal Lotion Keri Silky Smooth Lotion  Keri Silky Smooth Sensitive Skin Lotion  Nivea Body Creamy Conditioning Oil  Nivea Body Extra Enriched Teacher, adult education Moisturizing Lotion Nivea Crme  Nivea Skin Firming Lotion   NutraDerm 30 Skin Lotion  NutraDerm Skin Lotion  NutraDerm Therapeutic Skin Cream  NutraDerm Therapeutic Skin Lotion  ProShield Protective Hand Cream  Provon moisturizing lotion  WHAT IS A BLOOD TRANSFUSION? Blood Transfusion Information  A transfusion is the replacement of blood or some of its parts. Blood is made up of multiple cells which provide different functions. Red blood cells carry oxygen and are used for blood loss replacement. White blood cells fight against infection. Platelets control bleeding. Plasma helps clot blood. Other blood products are available for specialized needs, such as hemophilia or other clotting disorders. BEFORE THE TRANSFUSION  Who gives blood for transfusions?  Healthy volunteers who are fully evaluated to make sure their blood is safe. This is blood bank blood. Transfusion therapy is the safest it has ever been in the practice of medicine. Before blood is taken from a donor, a complete history is taken to make sure that person has no history of diseases nor engages in risky social behavior (examples are intravenous drug use or sexual activity with multiple partners). The donor's travel history is screened to minimize risk of transmitting infections, such as malaria. The donated blood is tested for signs of infectious diseases, such as  HIV and hepatitis. The blood is then tested to be sure it is compatible with you in order to minimize the chance of a transfusion reaction. If you or a relative donates blood, this is often done in anticipation of surgery and is not appropriate for emergency situations. It takes many days to process the donated blood. RISKS AND COMPLICATIONS Although transfusion therapy is very safe and saves many lives, the main dangers of transfusion include:  Getting an infectious disease. Developing a transfusion reaction. This is an allergic reaction to something in the blood you were given. Every precaution is taken to prevent this. The  decision to have a blood transfusion has been considered carefully by your caregiver before blood is given. Blood is not given unless the benefits outweigh the risks. AFTER THE TRANSFUSION Right after receiving a blood transfusion, you will usually feel much better and more energetic. This is especially true if your red blood cells have gotten low (anemic). The transfusion raises the level of the red blood cells which carry oxygen, and this usually causes an energy increase. The nurse administering the transfusion will monitor you carefully for complications. HOME CARE INSTRUCTIONS  No special instructions are needed after a transfusion. You may find your energy is better. Speak with your caregiver about any limitations on activity for underlying diseases you may have. SEEK MEDICAL CARE IF:  Your condition is not improving after your transfusion. You develop redness or irritation at the intravenous (IV) site. SEEK IMMEDIATE MEDICAL CARE IF:  Any of the following symptoms occur over the next 12 hours: Shaking chills. You have a temperature by mouth above 102 F (38.9 C), not controlled by medicine. Chest, back, or muscle pain. People around you feel you are not acting correctly or are confused. Shortness of breath or difficulty breathing. Dizziness and fainting. You get a rash or develop hives. You have a decrease in urine output. Your urine turns a dark color or changes to pink, red, or brown. Any of the following symptoms occur over the next 10 days: You have a temperature by mouth above 102 F (38.9 C), not controlled by medicine. Shortness of breath. Weakness after normal activity. The white part of the eye turns yellow (jaundice). You have a decrease in the amount of urine or are urinating less often. Your urine turns a dark color or changes to pink, red, or brown. Document Released: 04/05/2000 Document Revised: 07/01/2011 Document Reviewed: 11/23/2007 Exit[employer] Patient Information  2014 [place], [place].  _______________________________________________________________________  Incentive Spirometer  An incentive spirometer is a tool that can help keep your lungs clear and active. This tool measures how well you are filling your lungs with each breath. Taking long deep breaths may help reverse or decrease the chance of developing breathing (pulmonary) problems (especially infection) following: A long period of time when you are unable to move or be active. BEFORE THE PROCEDURE  If the spirometer includes an indicator to show your best effort, your nurse or respiratory therapist will set it to a desired goal. If possible, sit up straight or lean slightly forward. Try not to slouch. Hold the incentive spirometer in an upright position. INSTRUCTIONS FOR USE  Sit on the edge of your bed if possible, or sit up as far as you can in bed or on a chair. Hold the incentive spirometer in an upright position. Breathe out normally. Place the mouthpiece in your mouth and seal your lips tightly around it. Breathe in slowly and as deeply as possible, raising  the piston or the ball toward the top of the column. Hold your breath for 3-5 seconds or for as long as possible. Allow the piston or ball to fall to the bottom of the column. Remove the mouthpiece from your mouth and breathe out normally. Rest for a few seconds and repeat Steps 1 through 7 at least 10 times every 1-2 hours when you are awake. Take your time and take a few normal breaths between deep breaths. The spirometer may include an indicator to show your best effort. Use the indicator as a goal to work toward during each repetition. After each set of 10 deep breaths, practice coughing to be sure your lungs are clear. If you have an incision (the cut made at the time of surgery), support your incision when coughing by placing a pillow or rolled up towels firmly against it. Once you are able to get out of bed, walk around indoors  and cough well. You may stop using the incentive spirometer when instructed by your caregiver.  RISKS AND COMPLICATIONS Take your time so you do not get dizzy or light-headed. If you are in pain, you may need to take or ask for pain medication before doing incentive spirometry. It is harder to take a deep breath if you are having pain. AFTER USE Rest and breathe slowly and easily. It can be helpful to keep track of a log of your progress. Your caregiver can provide you with a simple table to help with this. If you are using the spirometer at home, follow these instructions: SEEK MEDICAL CARE IF:  You are having difficultly using the spirometer. You have trouble using the spirometer as often as instructed. Your pain medication is not giving enough relief while using the spirometer. You develop fever of 100.5 F (38.1 C) or higher. SEEK IMMEDIATE MEDICAL CARE IF:  You cough up bloody sputum that had not been present before. You develop fever of 102 F (38.9 C) or greater. You develop worsening pain at or near the incision site. MAKE SURE YOU:  Understand these instructions. Will watch your condition. Will get help right away if you are not doing well or get worse. Document Released: 08/19/2006 Document Revised: 07/01/2011 Document Reviewed: 10/20/2006 [facility] Patient Information 2014 [place], [place].   ________________________________________________________________________

## 2023-02-04 NOTE — Progress Notes (Signed)
COVID Vaccine Completed: yes  Date of COVID positive in last 90 days:  PCP - Mary-Margaret Daphine Deutscher, FNP LOV 01/20/23 Cardiologist - Bettey Mare, PA LOV 02/26/22 Gastroenterologist- Earnest Bailey, DO  Cardiac clearance by Barth Kirks 12/30/22 in Epic  Chest x-ray - 07/01/22 Epic EKG - 07/02/22 Epic Stress Test - over 5 years ago per pt ECHO - years ago per pt Cardiac Cath - 2007 Pacemaker/ICD device last checked: n/a Spinal Cord Stimulator: n/a  Bowel Prep - no  Sleep Study - n/a CPAP -   Fasting Blood Sugar - n/a Checks Blood Sugar _____ times a day  Last dose of GLP1 agonist-  N/A GLP1 instructions:  N/A   Last dose of SGLT-2 inhibitors-  N/A SGLT-2 instructions: N/A   Blood Thinner Instructions:  Time Aspirin Instructions: ASA 81, continue taking per patient and daughter on paperwork from surgeon Last Dose:  Activity level: Can go up a flight of stairs and perform activities of daily living without stopping and without symptoms of chest pain or shortness of breath. Slow with stairs due to hip issues  Anesthesia review: HTN, CAD, anemia, MI, chronic ischemic heart disease   Patient denies shortness of breath, fever, cough and chest pain at PAT appointment  Patient verbalized understanding of instructions that were given to them at the PAT appointment. Patient was also instructed that they will need to review over the PAT instructions again at home before surgery.

## 2023-02-05 ENCOUNTER — Encounter (HOSPITAL_COMMUNITY): Payer: Self-pay

## 2023-02-05 ENCOUNTER — Encounter (HOSPITAL_COMMUNITY)
Admission: RE | Admit: 2023-02-05 | Discharge: 2023-02-05 | Disposition: A | Discharge status: Home / Self Care | Payer: Medicare Other | Source: Ambulatory Visit | Attending: Orthopedic Surgery

## 2023-02-05 ENCOUNTER — Other Ambulatory Visit: Payer: Self-pay

## 2023-02-05 VITALS — BP 120/63 | HR 54 | Temp 97.8°F | Resp 14 | Ht 65.0 in | Wt 137.0 lb

## 2023-02-05 DIAGNOSIS — I10 Essential (primary) hypertension: Secondary | ICD-10-CM

## 2023-02-05 DIAGNOSIS — Z01812 Encounter for preprocedural laboratory examination: Secondary | ICD-10-CM | POA: Insufficient documentation

## 2023-02-05 DIAGNOSIS — Z01818 Encounter for other preprocedural examination: Secondary | ICD-10-CM

## 2023-02-05 LAB — CBC
HCT: 34.6 % — ABNORMAL LOW (ref 36.0–46.0)
Hemoglobin: 10.9 g/dL — ABNORMAL LOW (ref 12.0–15.0)
MCH: 30.6 pg (ref 26.0–34.0)
MCHC: 31.5 g/dL (ref 30.0–36.0)
MCV: 97.2 fL (ref 80.0–100.0)
Platelets: 218 10*3/uL (ref 150–400)
RBC: 3.56 MIL/uL — ABNORMAL LOW (ref 3.87–5.11)
RDW: 13.7 % (ref 11.5–15.5)
WBC: 6.7 10*3/uL (ref 4.0–10.5)
nRBC: 0 % (ref 0.0–0.2)

## 2023-02-05 LAB — TYPE AND SCREEN
ABO/RH(D): O POS
Antibody Screen: NEGATIVE

## 2023-02-05 LAB — BASIC METABOLIC PANEL
Anion gap: 10 (ref 5–15)
BUN: 17 mg/dL (ref 8–23)
CO2: 31 mmol/L (ref 22–32)
Calcium: 9.7 mg/dL (ref 8.9–10.3)
Chloride: 99 mmol/L (ref 98–111)
Creatinine, Ser: 0.92 mg/dL (ref 0.44–1.00)
GFR, Estimated: >60 mL/min (ref >60)
Glucose, Bld: 80 mg/dL (ref 70–99)
Potassium: 3.3 mmol/L — ABNORMAL LOW (ref 3.5–5.1)
Sodium: 140 mmol/L (ref 135–145)

## 2023-02-05 LAB — SURGICAL PCR SCREEN
MRSA, PCR: NEGATIVE
Staphylococcus aureus: NEGATIVE

## 2023-02-06 NOTE — Progress Notes (Signed)
DISCUSSION: Teresa Holloway is an 82 yo female who presents to PAT prior to R THA on 02/17/23 with Dr. Turner Daniels. PMH of CAD, hx of MI, HTN, HLD, GERD, anxiety, depression, arthritis.  Patient follows with Cardiology for hx of mild, diffuse CAD by cath not amenable to PCI, NSTEMI in 2004 and 2007. Last seen in clinic on 02/26/2022. Perlast OV note patient is doing well from cardiac standpoint. "Reserve invasive cardiovascular strategy only if symptoms of angina and/or signs of coronary ischemia develop in spite of optimal medical therapy. Discussed with patient - she appears to understand and agree to the plan as outlined." Cardiac clearance received 12/30/22:   "Brenisha Tsui is having a non-cardiac surgical procedure for RIGHT TOTAL HIP REPLACEMENT and requires risk stratification.   Clinical risk factors (1 point each) Patient has: -CAD: Yes -STROKE/TIA: No -CHF (compensated or prior): No -CKD (Cr >2): No -Insulin dependent DM: No -High risk surgery (any vascular, abdominal or thoracic): No  RCRI score: 1. Intermediate Risk  Functional capacity: NM Stress 09/23/2018 negative   LVEF none  No further cardiac testing is warranted. Patient should continue betablocker through surgery as clinically able if applicable."   Patient recently had several ED visits for left leg swelling. Was seen at [facility] and [facility] ED. DVT study was negative. Advised to elevate leg and wear compression stockings. Has followed up with PCP after this and it was noted that this problem was improving.  VS: BP 120/63   Pulse (!) 54   Temp 36.6 C (Oral)   Resp 14   Ht 5\' 5"  (1.651 m)   Wt 62.1 kg   SpO2 97%   BMI 22.80 kg/m   PROVIDERS: Bennie Pierini, FNP Cardiology: Dorena Cookey, MD; Bettey Mare PA-C ([facility])  LABS: Labs reviewed: Acceptable for surgery. (all labs ordered are listed, but only abnormal results are displayed)  Labs Reviewed  BASIC METABOLIC PANEL - Abnormal;  Notable for the following components:      Result Value   Potassium 3.3 (*)    All other components within normal limits  CBC - Abnormal; Notable for the following components:   RBC 3.56 (*)    Hemoglobin 10.9 (*)    HCT 34.6 (*)    All other components within normal limits  SURGICAL PCR SCREEN  TYPE AND SCREEN     IMAGES:   CTA Chest 07/01/22:  IMPRESSION: 1. No evidence for acute pulmonary embolism. 2. Mild cardiac enlargement. 3. Kyphosis deformity within the upper thoracic spine with multilevel degenerative disc disease. 4. No signs of interstitial edema or pneumonia. 5. No suspicious lung nodule or mass identified. 6.  Aortic Atherosclerosis (ICD10-I70.0).   EKG:   CV:   Stress echo 11/23/2018 ([facility]):  The patient had no chest pain during stress The patient achieved 91 % of maximum predicted heart rate. Normal left ventricular function and global wall motion with stress. Global LV function is preserved with stress. Negative dobutamine echocardiography for inducible ischemia at target heart rate. Negative stress ECG for inducible ischemia at target heart rate.   REST ECHO Normal left ventricular function at rest. There was normal left ventricular  wall motion at rest. There were no segmental wall motion abnormalities at  rest. The estimated LV ejection fraction is 55-60% .   Past Medical History:  Diagnosis Date   Anxiety    Arthritis    Knees, hips   CAD (coronary artery disease)    Chronic back pain    Depression  GERD (gastroesophageal reflux disease)    Hyperlipidemia    Hypertension    MI (myocardial infarction) (HCC)    Osteopenia     Past Surgical History:  Procedure Laterality Date   BIOPSY  07/16/2021   Procedure: BIOPSY;  Surgeon: Lanelle Bal, DO;  Location: [facility];  Service: Endoscopy;;   COLONOSCOPY WITH PROPOFOL N/A 07/16/2021   Procedure: COLONOSCOPY WITH PROPOFOL;  Surgeon: Lanelle Bal, DO;  Location: [facility];  Service: Endoscopy;  Laterality: N/A;  10:30am   ECTOPIC PREGNANCY SURGERY     ESOPHAGOGASTRODUODENOSCOPY (EGD) WITH PROPOFOL N/A 07/16/2021   Procedure: ESOPHAGOGASTRODUODENOSCOPY (EGD) WITH PROPOFOL;  Surgeon: Lanelle Bal, DO;  Location: [facility];  Service: Endoscopy;  Laterality: N/A;   left breast biospy     left wrist fracture     TOTAL KNEE ARTHROPLASTY Left 01/25/2019   Procedure: LEFT TOTAL KNEE ARTHROPLASTY;  Surgeon: Gean Birchwood, MD;  Location: [facility];  Service: Orthopedics;  Laterality: Left;    MEDICATIONS:  amLODipine (NORVASC) 5 MG tablet   aspirin EC 81 MG tablet   atorvastatin (LIPITOR) 40 MG tablet   benazepril (LOTENSIN) 40 MG tablet   bisacodyl (DULCOLAX) 5 MG EC tablet   brimonidine (ALPHAGAN) 0.2 % ophthalmic solution   cholecalciferol (VITAMIN D) 1000 UNITS tablet   cyanocobalamin (VITAMIN B12) 1000 MCG tablet   diclofenac sodium (VOLTAREN) 1 % GEL   escitalopram (LEXAPRO) 20 MG tablet   hydrochlorothiazide (HYDRODIURIL) 25 MG tablet   isosorbide mononitrate (IMDUR) 60 MG 24 hr tablet   latanoprost (XALATAN) 0.005 % ophthalmic solution   metoprolol succinate (TOPROL-XL) 25 MG 24 hr tablet   nitroGLYCERIN (NITROSTAT) 0.4 MG SL tablet   polyethylene glycol powder (GLYCOLAX/MIRALAX) 17 GM/SCOOP powder   potassium chloride (KLOR-CON M10) 10 MEQ tablet   Probiotic CHEW   traMADol (ULTRAM) 50 MG tablet   No current facility-administered medications for this encounter.   Marcille Blanco [facility] Surgical Short Stay/Anesthesiology [facility] Phone [phone] 02/06/2023 11:36 AM

## 2023-02-07 NOTE — Care Plan (Signed)
Ortho Bundle Case Management Note  Patient Details  Name: Teresa Holloway MRN: [id number] Date of Birth: 1940/08/21  met with patient and daughter in the office for H&P. patient lives alone and has reports limited help. son and daughter live locally. son works and can check on her after work. daugther states she can't be there all the time and patient cannot come to her home after surgery. patient ambulates with a cane. script given for walker. daughter to pick up. will add OT/Aide to HHPT orders. discharge instructions given to patient. daughter has the mailed copy at home. states no questions. HHPT referral to [facility] and OPPT set up with [facility]. MD and patient in agreement with plan. Choice offered                   DME Arranged:  Walker rolling DME Agency:  [employer]  HH Arranged:  PT HH Agency:  [facility] ([facility])  Additional Comments: Please contact me with any questions of if this plan should need to change.  Shauna Hugh,  RN,BSN,MHA,CCM  [facility] Orthopaedic Specialist  [phone] 02/07/2023, 3:35 PM

## 2023-02-11 ENCOUNTER — Ambulatory Visit: Payer: Medicare Other | Admitting: Nurse Practitioner

## 2023-02-11 ENCOUNTER — Encounter: Payer: Self-pay | Admitting: Nurse Practitioner

## 2023-02-11 VITALS — BP 119/65 | HR 60 | Temp 98.4°F | Resp 20 | Ht 65.0 in | Wt 139.0 lb

## 2023-02-11 DIAGNOSIS — K5901 Slow transit constipation: Secondary | ICD-10-CM | POA: Diagnosis not present

## 2023-02-11 DIAGNOSIS — I8390 Asymptomatic varicose veins of unspecified lower extremity: Secondary | ICD-10-CM | POA: Diagnosis not present

## 2023-02-11 NOTE — Progress Notes (Signed)
Subjective:    Patient ID: Teresa Holloway, female    DOB: 11-19-1940, 82 y.o.   MRN: [id number]   Chief Complaint: Foot Pain (Right foot/) and Trouble having bowel movements   Foot Pain Pertinent negatives include no abdominal pain, chest pain, diaphoresis, headaches, rash or weakness.   Patient in with 2 complaints: - foot pain- has a large vein on medial side of both feet that hurt. Intermittent. Rates pain 2/10. - constipation- rinks prune juice which helps. She use to be on miralax and she stopped taking. Patient Active Problem List   Diagnosis Date Noted   Hypokalemia 09/05/2022   Anemia 02/05/2022   Constipation 10/29/2021   Helicobacter pylori gastritis 10/29/2021   Status post total left knee replacement 01/25/2019   Pain management contract agreement 02/22/2016   Opioid type dependence, continuous (HCC) 02/22/2016   BMI 30.0-30.9,adult 01/02/2015   Osteoarthritis of left hip 08/23/2013   Depression 08/23/2013   Osteoporosis 06/23/2013   Glaucoma of right eye 06/23/2013   CAD (coronary artery disease) 08/19/2012   History of MI (myocardial infarction) 08/19/2012   Arthritis of both knees 08/19/2012   Essential hypertension, benign 08/10/2012   Hyperlipidemia with target LDL less than 100 08/10/2012   GERD (gastroesophageal reflux disease) 08/10/2012       Review of Systems  Constitutional:  Negative for diaphoresis.  Eyes:  Negative for pain.  Respiratory:  Negative for shortness of breath.   Cardiovascular:  Negative for chest pain, palpitations and leg swelling.  Gastrointestinal:  Negative for abdominal pain.  Endocrine: Negative for polydipsia.  Skin:  Negative for rash.  Neurological:  Negative for dizziness, weakness and headaches.  Hematological:  Does not bruise/bleed easily.  All other systems reviewed and are negative.      Objective:   Physical Exam Constitutional:      Appearance: Normal appearance.  Cardiovascular:     Rate and  Rhythm: Normal rate and regular rhythm.     Heart sounds: Normal heart sounds.     Comments: Varicose veins medial side of both feet. Pulmonary:     Effort: Pulmonary effort is normal.     Breath sounds: Normal breath sounds.  Skin:    General: Skin is warm.  Neurological:     General: No focal deficit present.     Mental Status: She is alert and oriented to person, place, and time.  Psychiatric:        Mood and Affect: Mood normal.        Behavior: Behavior normal.     BP 119/65   Pulse 60   Temp 98.4 F (36.9 C) (Temporal)   Resp 20   Ht 5\' 5"  (1.651 m)   Wt 139 lb (63 kg)   SpO2 98%   BMI 23.13 kg/m        Assessment & Plan:   Teresa Holloway in today with chief complaint of Foot Pain (Right foot/) and Trouble having bowel movements   1. Varicose veins of foot Baby aspirin may help but don't start until after hip surgery  2. Slow transit constipation Miralax daily in prune juice Increase fiber in diet Increase fluids RTO prn    The above assessment and management plan was discussed with the patient. The patient verbalized understanding of and has agreed to the management plan. Patient is aware to call the clinic if symptoms persist or worsen. Patient is aware when to return to the clinic for a follow-up visit. Patient educated on  when it is appropriate to go to the emergency department.   Mary-Margaret Daphine Deutscher, FNP

## 2023-02-11 NOTE — Patient Instructions (Signed)

## 2023-02-12 ENCOUNTER — Encounter (HOSPITAL_COMMUNITY): Payer: Self-pay | Admitting: Orthopedic Surgery

## 2023-02-14 DIAGNOSIS — M1611 Unilateral primary osteoarthritis, right hip: Secondary | ICD-10-CM | POA: Diagnosis present

## 2023-02-14 NOTE — H&P (Signed)
TOTAL HIP ADMISSION H&P  Patient is admitted for right total hip arthroplasty.  Subjective:  Chief Complaint: right hip pain  HPI: Teresa Holloway, 82 y.o. female, has a history of pain and functional disability in the right hip(s) due to arthritis and patient has failed non-surgical conservative treatments for greater than 12 weeks to include NSAID's and/or analgesics, corticosteriod injections, flexibility and strengthening excercises, use of assistive devices, weight reduction as appropriate, and activity modification.  Onset of symptoms was gradual starting 2 years ago with gradually worsening course since that time.The patient noted no past surgery on the right hip(s).  Patient currently rates pain in the right hip at 10 out of 10 with activity. Patient has night pain, worsening of pain with activity and weight bearing, trendelenberg gait, pain that interfers with activities of daily living, and pain with passive range of motion. Patient has evidence of subchondral sclerosis and joint space narrowing by imaging studies. This condition presents safety issues increasing the risk of falls.  There is no current active infection.  Patient Active Problem List   Diagnosis Date Noted   Osteoarthritis of right hip 02/14/2023   Hypokalemia 09/05/2022   Anemia 02/05/2022   Constipation 10/29/2021   Helicobacter pylori gastritis 10/29/2021   Status post total left knee replacement 01/25/2019   Pain management contract agreement 02/22/2016   Opioid type dependence, continuous (HCC) 02/22/2016   BMI 30.0-30.9,adult 01/02/2015   Osteoarthritis of left hip 08/23/2013   Depression 08/23/2013   Osteoporosis 06/23/2013   Glaucoma of right eye 06/23/2013   CAD (coronary artery disease) 08/19/2012   History of MI (myocardial infarction) 08/19/2012   Arthritis of both knees 08/19/2012   Essential hypertension, benign 08/10/2012   Hyperlipidemia with target LDL less than 100 08/10/2012   GERD  (gastroesophageal reflux disease) 08/10/2012   Past Medical History:  Diagnosis Date   Anxiety    Arthritis    Knees, hips   CAD (coronary artery disease)    Chronic back pain    Depression    GERD (gastroesophageal reflux disease)    Hyperlipidemia    Hypertension    MI (myocardial infarction) (HCC)    Osteopenia     Past Surgical History:  Procedure Laterality Date   BIOPSY  07/16/2021   Procedure: BIOPSY;  Surgeon: Lanelle Bal, DO;  Location: [facility];  Service: Endoscopy;;   COLONOSCOPY WITH PROPOFOL N/A 07/16/2021   Procedure: COLONOSCOPY WITH PROPOFOL;  Surgeon: Lanelle Bal, DO;  Location: [facility];  Service: Endoscopy;  Laterality: N/A;  10:30am   ECTOPIC PREGNANCY SURGERY     ESOPHAGOGASTRODUODENOSCOPY (EGD) WITH PROPOFOL N/A 07/16/2021   Procedure: ESOPHAGOGASTRODUODENOSCOPY (EGD) WITH PROPOFOL;  Surgeon: Lanelle Bal, DO;  Location: [facility];  Service: Endoscopy;  Laterality: N/A;   left breast biospy     left wrist fracture     TOTAL KNEE ARTHROPLASTY Left 01/25/2019   Procedure: LEFT TOTAL KNEE ARTHROPLASTY;  Surgeon: Gean Birchwood, MD;  Location: [facility];  Service: Orthopedics;  Laterality: Left;    No current facility-administered medications for this encounter.   Current Outpatient Medications  Medication Sig Dispense Refill Last Dose   amLODipine (NORVASC) 5 MG tablet Take 1 tablet (5 mg total) by mouth daily. (Patient taking differently: Take 5 mg by mouth at bedtime.) 90 tablet 1    aspirin EC 81 MG tablet Take 1 tablet (81 mg total) by mouth 2 (two) times daily. (Patient taking differently: Take 81 mg by mouth  daily.) 60 tablet 0    atorvastatin (LIPITOR) 40 MG tablet TAKE 1 TABLET BY MOUTH DAILY AT 6 PM. 90 tablet 1    benazepril (LOTENSIN) 40 MG tablet Take 1 tablet (40 mg total) by mouth daily. (Patient taking differently: Take 40 mg by mouth at bedtime.) 90 tablet 1    bisacodyl (DULCOLAX) 5 MG EC tablet Take 5 mg by mouth at  bedtime.      brimonidine (ALPHAGAN) 0.2 % ophthalmic solution Place 1 drop into both eyes daily.      cholecalciferol (VITAMIN D) 1000 UNITS tablet Take 1,000 Units by mouth daily.      cyanocobalamin (VITAMIN B12) 1000 MCG tablet Take 1,000 mcg by mouth daily.      diclofenac sodium (VOLTAREN) 1 % GEL Apply 2 g topically 4 (four) times daily. (Patient taking differently: Apply 2 g topically 4 (four) times daily as needed (pain).) 500 g 1    escitalopram (LEXAPRO) 20 MG tablet Take 1 tablet (20 mg total) by mouth daily. (Patient taking differently: Take 20 mg by mouth at bedtime.) 90 tablet 1    hydrochlorothiazide (HYDRODIURIL) 25 MG tablet Take 1 tablet (25 mg total) by mouth daily. 90 tablet 1    isosorbide mononitrate (IMDUR) 60 MG 24 hr tablet TAKE 1 TABLET BY MOUTH EVERYDAY AT BEDTIME 90 tablet 1    latanoprost (XALATAN) 0.005 % ophthalmic solution Place 1 drop into both eyes at bedtime.      metoprolol succinate (TOPROL-XL) 25 MG 24 hr tablet Take 1 tablet (25 mg total) by mouth daily. 30 tablet 11    nitroGLYCERIN (NITROSTAT) 0.4 MG SL tablet Place 1 tablet (0.4 mg total) under the tongue every 5 (five) minutes as needed for chest pain. 30 tablet 1    polyethylene glycol powder (GLYCOLAX/MIRALAX) 17 GM/SCOOP powder Take 17 g by mouth daily.      potassium chloride (KLOR-CON M10) 10 MEQ tablet Take 1 tablet (10 mEq total) by mouth 2 (two) times daily. (Patient taking differently: Take 10 mEq by mouth daily.) 180 tablet 1    Probiotic CHEW Chew 1 capsule by mouth daily.      traMADol (ULTRAM) 50 MG tablet Take 1 tablet (50 mg total) by mouth 2 (two) times daily. (Patient taking differently: Take 50 mg by mouth 2 (two) times daily as needed for moderate pain (pain score 4-6).) 60 tablet 2    Allergies  Allergen Reactions   Atarax [Hydroxyzine] Nausea And Vomiting   Bactrim [Sulfamethoxazole-Trimethoprim] Nausea Only   Coreg [Carvedilol] Other (See Comments)    Unknown    Social History    Tobacco Use   Smoking status: Never   Smokeless tobacco: Never  Substance Use Topics   Alcohol use: No    Family History  Problem Relation Age of Onset   Asthma Father    Diabetes Sister    Diabetes Brother      Review of Systems  Constitutional: Negative.   HENT: Negative.    Eyes: Negative.   Respiratory: Negative.    Cardiovascular:        Htn  Gastrointestinal:  Positive for constipation.  Endocrine: Negative.   Genitourinary: Negative.   Musculoskeletal:  Positive for arthralgias and myalgias.  Skin: Negative.   Allergic/Immunologic: Negative.   Neurological: Negative.   Hematological: Negative.   Psychiatric/Behavioral: Negative.      Objective:  Physical Exam Constitutional:      Appearance: Normal appearance. She is normal weight.  HENT:  Head: Normocephalic and atraumatic.     Nose: Nose normal.  Eyes:     Pupils: Pupils are equal, round, and reactive to light.  Pulmonary:     Effort: Pulmonary effort is normal.  Musculoskeletal:     Cervical back: Normal range of motion and neck supple.     Comments: she does have significant pain with attempts of internal rotation with the right hip.  She can internally rotate to approximately 10-15.  Calves are soft and nontender.  Skin:    General: Skin is warm and dry.  Neurological:     General: No focal deficit present.     Mental Status: She is alert and oriented to person, place, and time. Mental status is at baseline.  Psychiatric:        Mood and Affect: Mood normal.        Behavior: Behavior normal.        Thought Content: Thought content normal.        Judgment: Judgment normal.     Vital signs in last 24 hours:    Labs:   Estimated body mass index is 23.13 kg/m as calculated from the following:   Height as of 02/11/23: 5\' 5"  (1.651 m).   Weight as of 02/11/23: 63 kg.   Imaging Review Plain radiographs demonstrate  AP of the pelvis and crosstable lateral of the right hip are taken  and reviewed in office today.  This shows severe arthritis superior weightbearing surface right hip.      Assessment/Plan:  End stage arthritis, right hip(s)  The patient history, physical examination, clinical judgement of the provider and imaging studies are consistent with end stage degenerative joint disease of the right hip(s) and total hip arthroplasty is deemed medically necessary. The treatment options including medical management, injection therapy, arthroscopy and arthroplasty were discussed at length. The risks and benefits of total hip arthroplasty were presented and reviewed. The risks due to aseptic loosening, infection, stiffness, dislocation/subluxation,  thromboembolic complications and other imponderables were discussed.  The patient acknowledged the explanation, agreed to proceed with the plan and consent was signed. Patient is being admitted for inpatient treatment for surgery, pain control, PT, OT, prophylactic antibiotics, VTE prophylaxis, progressive ambulation and ADL's and discharge planning.The patient is planning to be discharged home with home health services    Patient's anticipated LOS is less than 2 midnights, meeting these requirements: - Younger than 31 - Lives within 1 hour of care - Has a competent adult at home to recover with post-op recover - NO history of  - Chronic pain requiring opiods  - Diabetes  - Coronary Artery Disease  - Heart failure  - Heart attack  - Stroke  - DVT/VTE  - Cardiac arrhythmia  - Respiratory Failure/COPD  - Renal failure  - Anemia  - Advanced Liver disease

## 2023-02-16 MED ORDER — TRANEXAMIC ACID 1000 MG/10ML IV SOLN
2000.0000 mg | INTRAVENOUS | Status: DC
  Filled 2023-02-16: qty 20

## 2023-02-17 ENCOUNTER — Encounter (HOSPITAL_COMMUNITY)
Admission: RE | Disposition: A | Discharge status: Home / Self Care | Payer: Self-pay | Source: Home / Self Care | Attending: Orthopedic Surgery

## 2023-02-17 ENCOUNTER — Encounter (HOSPITAL_COMMUNITY): Payer: Self-pay | Admitting: Orthopedic Surgery

## 2023-02-17 ENCOUNTER — Other Ambulatory Visit: Payer: Self-pay

## 2023-02-17 ENCOUNTER — Ambulatory Visit (HOSPITAL_BASED_OUTPATIENT_CLINIC_OR_DEPARTMENT_OTHER): Payer: Medicare Other | Admitting: Medical

## 2023-02-17 ENCOUNTER — Ambulatory Visit (HOSPITAL_COMMUNITY): Payer: Medicare Other

## 2023-02-17 ENCOUNTER — Ambulatory Visit (HOSPITAL_COMMUNITY): Payer: Medicare Other | Admitting: Medical

## 2023-02-17 ENCOUNTER — Ambulatory Visit (HOSPITAL_COMMUNITY)
Admission: RE | Admit: 2023-02-17 | Discharge: 2023-02-18 | Disposition: A | Discharge status: Home / Self Care | Payer: Medicare Other | Attending: Orthopedic Surgery | Admitting: Orthopedic Surgery

## 2023-02-17 DIAGNOSIS — I252 Old myocardial infarction: Secondary | ICD-10-CM | POA: Insufficient documentation

## 2023-02-17 DIAGNOSIS — I10 Essential (primary) hypertension: Secondary | ICD-10-CM | POA: Diagnosis not present

## 2023-02-17 DIAGNOSIS — I2510 Atherosclerotic heart disease of native coronary artery without angina pectoris: Secondary | ICD-10-CM

## 2023-02-17 DIAGNOSIS — E785 Hyperlipidemia, unspecified: Secondary | ICD-10-CM | POA: Diagnosis not present

## 2023-02-17 DIAGNOSIS — M1611 Unilateral primary osteoarthritis, right hip: Secondary | ICD-10-CM | POA: Insufficient documentation

## 2023-02-17 DIAGNOSIS — Z96641 Presence of right artificial hip joint: Secondary | ICD-10-CM | POA: Diagnosis not present

## 2023-02-17 HISTORY — PX: TOTAL HIP ARTHROPLASTY: SHX124

## 2023-02-17 SURGERY — ARTHROPLASTY, HIP, TOTAL, ANTERIOR APPROACH
Anesthesia: Spinal | Site: Hip | Laterality: Right

## 2023-02-17 MED ORDER — 0.9 % SODIUM CHLORIDE (POUR BTL) OPTIME
TOPICAL | Status: DC | PRN
  Administered 2023-02-17: 1000 mL

## 2023-02-17 MED ORDER — TRANEXAMIC ACID-NACL 1000-0.7 MG/100ML-% IV SOLN
1000.0000 mg | Freq: Once | INTRAVENOUS | Status: AC
  Administered 2023-02-17: 1000 mg via INTRAVENOUS
  Filled 2023-02-17: qty 100

## 2023-02-17 MED ORDER — FENTANYL CITRATE PF 50 MCG/ML IJ SOSY
25.0000 ug | PREFILLED_SYRINGE | INTRAMUSCULAR | Status: DC | PRN
  Administered 2023-02-17: 50 ug via INTRAVENOUS
  Administered 2023-02-17: 25 ug via INTRAVENOUS

## 2023-02-17 MED ORDER — BUPIVACAINE-EPINEPHRINE 0.25% -1:200000 IJ SOLN
INTRAMUSCULAR | Status: AC
  Filled 2023-02-17: qty 1

## 2023-02-17 MED ORDER — BISACODYL 5 MG PO TBEC
5.0000 mg | DELAYED_RELEASE_TABLET | Freq: Every day | ORAL | Status: DC
  Administered 2023-02-17: 5 mg via ORAL
  Filled 2023-02-17: qty 1

## 2023-02-17 MED ORDER — PHENOL 1.4 % MT LIQD: 1.0000 | OROMUCOSAL | Status: DC | PRN

## 2023-02-17 MED ORDER — BUPIVACAINE LIPOSOME 1.3 % IJ SUSP
INTRAMUSCULAR | Status: AC
  Filled 2023-02-17: qty 10

## 2023-02-17 MED ORDER — BISACODYL 5 MG PO TBEC: 5.0000 mg | DELAYED_RELEASE_TABLET | Freq: Every day | ORAL | Status: DC | PRN

## 2023-02-17 MED ORDER — METOCLOPRAMIDE HCL 5 MG PO TABS: 5.0000 mg | ORAL_TABLET | Freq: Three times a day (TID) | ORAL | Status: DC | PRN

## 2023-02-17 MED ORDER — CHLORHEXIDINE GLUCONATE 0.12 % MT SOLN
15.0000 mL | Freq: Once | OROMUCOSAL | Status: AC
  Administered 2023-02-17: 15 mL via OROMUCOSAL

## 2023-02-17 MED ORDER — FENTANYL CITRATE PF 50 MCG/ML IJ SOSY
PREFILLED_SYRINGE | INTRAMUSCULAR | Status: AC
  Administered 2023-02-17: 25 ug via INTRAVENOUS
  Filled 2023-02-17: qty 3

## 2023-02-17 MED ORDER — BRIMONIDINE TARTRATE 0.2 % OP SOLN
1.0000 [drp] | Freq: Every day | OPHTHALMIC | Status: DC
  Filled 2023-02-17: qty 5

## 2023-02-17 MED ORDER — AMLODIPINE BESYLATE 5 MG PO TABS
5.0000 mg | ORAL_TABLET | Freq: Every day | ORAL | Status: DC
  Administered 2023-02-17: 5 mg via ORAL
  Filled 2023-02-17: qty 1

## 2023-02-17 MED ORDER — FLEET ENEMA RE ENEM: 1.0000 | ENEMA | Freq: Once | RECTAL | Status: DC | PRN

## 2023-02-17 MED ORDER — EPHEDRINE SULFATE-NACL 50-0.9 MG/10ML-% IV SOSY
PREFILLED_SYRINGE | INTRAVENOUS | Status: DC | PRN
Start: 2023-02-17 — End: 2023-02-17
  Administered 2023-02-17 (×3): 5 mg via INTRAVENOUS

## 2023-02-17 MED ORDER — ASPIRIN 81 MG PO TBEC: 81.0000 mg | DELAYED_RELEASE_TABLET | Freq: Two times a day (BID) | ORAL | 0 refills | Status: DC

## 2023-02-17 MED ORDER — DOCUSATE SODIUM 100 MG PO CAPS
100.0000 mg | ORAL_CAPSULE | Freq: Two times a day (BID) | ORAL | Status: DC
  Administered 2023-02-17 – 2023-02-18 (×2): 100 mg via ORAL
  Filled 2023-02-17 (×2): qty 1

## 2023-02-17 MED ORDER — HYDROMORPHONE HCL 1 MG/ML IJ SOLN
0.5000 mg | INTRAMUSCULAR | Status: DC | PRN
  Administered 2023-02-17: 0.5 mg via INTRAVENOUS
  Filled 2023-02-17: qty 1

## 2023-02-17 MED ORDER — KCL IN DEXTROSE-NACL 20-5-0.45 MEQ/L-%-% IV SOLN
INTRAVENOUS | Status: AC
  Filled 2023-02-17 (×2): qty 1000

## 2023-02-17 MED ORDER — POVIDONE-IODINE 10 % EX SWAB: 2.0000 | Freq: Once | CUTANEOUS | Status: DC

## 2023-02-17 MED ORDER — LACTATED RINGERS IV SOLN: INTRAVENOUS | Status: DC

## 2023-02-17 MED ORDER — PROPOFOL 500 MG/50ML IV EMUL
INTRAVENOUS | Status: DC | PRN
  Administered 2023-02-17: 50 ug/kg/min via INTRAVENOUS

## 2023-02-17 MED ORDER — DIPHENHYDRAMINE HCL 12.5 MG/5ML PO ELIX: 12.5000 mg | ORAL_SOLUTION | ORAL | Status: DC | PRN

## 2023-02-17 MED ORDER — PANTOPRAZOLE SODIUM 40 MG PO TBEC
40.0000 mg | DELAYED_RELEASE_TABLET | Freq: Every day | ORAL | Status: DC
  Administered 2023-02-18: 40 mg via ORAL
  Filled 2023-02-17: qty 1

## 2023-02-17 MED ORDER — DEXAMETHASONE SODIUM PHOSPHATE 10 MG/ML IJ SOLN
10.0000 mg | Freq: Once | INTRAMUSCULAR | Status: DC
  Filled 2023-02-17: qty 1

## 2023-02-17 MED ORDER — HYDROMORPHONE HCL 2 MG PO TABS
1.0000 mg | ORAL_TABLET | ORAL | Status: DC | PRN
  Administered 2023-02-17: 2 mg via ORAL
  Filled 2023-02-17 (×2): qty 1

## 2023-02-17 MED ORDER — ESCITALOPRAM OXALATE 20 MG PO TABS
20.0000 mg | ORAL_TABLET | Freq: Every day | ORAL | Status: DC
  Administered 2023-02-17: 20 mg via ORAL
  Filled 2023-02-17: qty 1

## 2023-02-17 MED ORDER — HYDROCHLOROTHIAZIDE 25 MG PO TABS
25.0000 mg | ORAL_TABLET | Freq: Every day | ORAL | Status: DC
  Administered 2023-02-17: 25 mg via ORAL
  Filled 2023-02-17 (×2): qty 1

## 2023-02-17 MED ORDER — POLYETHYLENE GLYCOL 3350 17 G PO PACK: 17.0000 g | PACK | Freq: Every day | ORAL | Status: DC | PRN

## 2023-02-17 MED ORDER — SODIUM CHLORIDE 0.9 % IV SOLN
INTRAVENOUS | Status: DC | PRN
  Administered 2023-02-17: 60 mL

## 2023-02-17 MED ORDER — TRANEXAMIC ACID-NACL 1000-0.7 MG/100ML-% IV SOLN
1000.0000 mg | INTRAVENOUS | Status: AC
  Administered 2023-02-17: 1000 mg via INTRAVENOUS
  Filled 2023-02-17: qty 100

## 2023-02-17 MED ORDER — ONDANSETRON HCL 4 MG/2ML IJ SOLN: 4.0000 mg | Freq: Four times a day (QID) | INTRAMUSCULAR | Status: DC | PRN

## 2023-02-17 MED ORDER — ASPIRIN 81 MG PO CHEW
81.0000 mg | CHEWABLE_TABLET | Freq: Two times a day (BID) | ORAL | Status: DC
  Administered 2023-02-17 – 2023-02-18 (×2): 81 mg via ORAL
  Filled 2023-02-17 (×2): qty 1

## 2023-02-17 MED ORDER — CEFAZOLIN SODIUM-DEXTROSE 2-4 GM/100ML-% IV SOLN
2.0000 g | INTRAVENOUS | Status: AC
  Administered 2023-02-17: 2 g via INTRAVENOUS
  Filled 2023-02-17: qty 100

## 2023-02-17 MED ORDER — MENTHOL 3 MG MT LOZG: 1.0000 | LOZENGE | OROMUCOSAL | Status: DC | PRN

## 2023-02-17 MED ORDER — SODIUM CHLORIDE (PF) 0.9 % IJ SOLN
INTRAMUSCULAR | Status: AC
  Filled 2023-02-17: qty 50

## 2023-02-17 MED ORDER — POTASSIUM CHLORIDE CRYS ER 10 MEQ PO TBCR
10.0000 meq | EXTENDED_RELEASE_TABLET | Freq: Every day | ORAL | Status: DC
  Administered 2023-02-17 – 2023-02-18 (×2): 10 meq via ORAL
  Filled 2023-02-17 (×2): qty 1

## 2023-02-17 MED ORDER — DEXAMETHASONE SODIUM PHOSPHATE 10 MG/ML IJ SOLN
INTRAMUSCULAR | Status: DC | PRN
  Administered 2023-02-17: 8 mg via INTRAVENOUS

## 2023-02-17 MED ORDER — TRAMADOL HCL 50 MG PO TABS
50.0000 mg | ORAL_TABLET | Freq: Four times a day (QID) | ORAL | Status: DC
  Administered 2023-02-17 – 2023-02-18 (×5): 50 mg via ORAL
  Filled 2023-02-17 (×5): qty 1

## 2023-02-17 MED ORDER — METHOCARBAMOL 1000 MG/10ML IJ SOLN: 500.0000 mg | Freq: Four times a day (QID) | INTRAMUSCULAR | Status: DC | PRN

## 2023-02-17 MED ORDER — ORAL CARE MOUTH RINSE: 15.0000 mL | Freq: Once | OROMUCOSAL | Status: AC

## 2023-02-17 MED ORDER — ACETAMINOPHEN 500 MG PO TABS
1000.0000 mg | ORAL_TABLET | Freq: Four times a day (QID) | ORAL | Status: AC
Start: 2023-02-17 — End: 2023-02-18
  Administered 2023-02-17 – 2023-02-18 (×4): 1000 mg via ORAL
  Filled 2023-02-17 (×4): qty 2

## 2023-02-17 MED ORDER — ISOSORBIDE MONONITRATE ER 60 MG PO TB24
60.0000 mg | ORAL_TABLET | Freq: Every day | ORAL | Status: DC
  Administered 2023-02-17: 60 mg via ORAL
  Filled 2023-02-17: qty 1

## 2023-02-17 MED ORDER — PANTOPRAZOLE SODIUM 40 MG PO TBEC: 40.0000 mg | DELAYED_RELEASE_TABLET | Freq: Every day | ORAL | Status: DC

## 2023-02-17 MED ORDER — CELECOXIB 200 MG PO CAPS
200.0000 mg | ORAL_CAPSULE | Freq: Two times a day (BID) | ORAL | Status: DC
  Administered 2023-02-17 – 2023-02-18 (×2): 200 mg via ORAL
  Filled 2023-02-17 (×2): qty 1

## 2023-02-17 MED ORDER — BUPIVACAINE IN DEXTROSE 0.75-8.25 % IT SOLN
INTRATHECAL | Status: DC | PRN
  Administered 2023-02-17: 1.6 mL via INTRATHECAL

## 2023-02-17 MED ORDER — METOPROLOL SUCCINATE ER 25 MG PO TB24
25.0000 mg | ORAL_TABLET | Freq: Every day | ORAL | Status: DC
  Administered 2023-02-18: 25 mg via ORAL
  Filled 2023-02-17: qty 1

## 2023-02-17 MED ORDER — POLYETHYLENE GLYCOL 3350 17 G PO PACK
17.0000 g | PACK | Freq: Every day | ORAL | Status: DC
  Administered 2023-02-18: 17 g via ORAL
  Filled 2023-02-17: qty 1

## 2023-02-17 MED ORDER — PHENYLEPHRINE HCL-NACL 20-0.9 MG/250ML-% IV SOLN
INTRAVENOUS | Status: DC | PRN
  Administered 2023-02-17: 50 ug/min via INTRAVENOUS

## 2023-02-17 MED ORDER — LATANOPROST 0.005 % OP SOLN
1.0000 [drp] | Freq: Every day | OPHTHALMIC | Status: DC
  Administered 2023-02-17: 1 [drp] via OPHTHALMIC
  Filled 2023-02-17: qty 2.5

## 2023-02-17 MED ORDER — PROPOFOL 10 MG/ML IV BOLUS
INTRAVENOUS | Status: DC | PRN
Start: 2023-02-17 — End: 2023-02-17
  Administered 2023-02-17: 40 mg via INTRAVENOUS
  Administered 2023-02-17: 30 mg via INTRAVENOUS

## 2023-02-17 MED ORDER — ONDANSETRON HCL 4 MG PO TABS: 4.0000 mg | ORAL_TABLET | Freq: Four times a day (QID) | ORAL | Status: DC | PRN

## 2023-02-17 MED ORDER — ACETAMINOPHEN 325 MG PO TABS: 325.0000 mg | ORAL_TABLET | Freq: Four times a day (QID) | ORAL | Status: DC | PRN

## 2023-02-17 MED ORDER — BUPIVACAINE LIPOSOME 1.3 % IJ SUSP: 10.0000 mL | Freq: Once | INTRAMUSCULAR | Status: DC

## 2023-02-17 MED ORDER — METHOCARBAMOL 500 MG PO TABS: 500.0000 mg | ORAL_TABLET | Freq: Four times a day (QID) | ORAL | Status: DC | PRN

## 2023-02-17 MED ORDER — METOCLOPRAMIDE HCL 5 MG/ML IJ SOLN: 5.0000 mg | Freq: Three times a day (TID) | INTRAMUSCULAR | Status: DC | PRN

## 2023-02-17 MED ORDER — ONDANSETRON HCL 4 MG/2ML IJ SOLN
INTRAMUSCULAR | Status: DC | PRN
Start: 2023-02-17 — End: 2023-02-17
  Administered 2023-02-17: 4 mg via INTRAVENOUS

## 2023-02-17 MED ORDER — ALUM & MAG HYDROXIDE-SIMETH 200-200-20 MG/5ML PO SUSP: 30.0000 mL | ORAL | Status: DC | PRN

## 2023-02-17 MED ORDER — ACETAMINOPHEN 500 MG PO TABS: 1000.0000 mg | ORAL_TABLET | Freq: Once | ORAL | Status: DC

## 2023-02-17 MED ORDER — HYDROMORPHONE HCL 2 MG PO TABS: 2.0000 mg | ORAL_TABLET | Freq: Four times a day (QID) | ORAL | 0 refills | Status: DC | PRN

## 2023-02-17 MED ORDER — TRANEXAMIC ACID 1000 MG/10ML IV SOLN
INTRAVENOUS | Status: DC | PRN
  Administered 2023-02-17: 2000 mg via TOPICAL

## 2023-02-17 MED ORDER — TIZANIDINE HCL 2 MG PO TABS: 2.0000 mg | ORAL_TABLET | Freq: Four times a day (QID) | ORAL | 0 refills | Status: DC | PRN

## 2023-02-17 MED ORDER — BENAZEPRIL HCL 20 MG PO TABS
40.0000 mg | ORAL_TABLET | Freq: Every day | ORAL | Status: DC
  Administered 2023-02-17: 40 mg via ORAL
  Filled 2023-02-17: qty 2

## 2023-02-17 SURGICAL SUPPLY — 46 items
BAG COUNTER SPONGE SURGICOUNT (BAG) ×1 IMPLANT
BAG DECANTER FOR FLEXI CONT (MISCELLANEOUS) ×2 IMPLANT
BAG SPNG CNTER NS LX DISP (BAG) ×1
BLADE SAW SGTL 18X1.27X75 (BLADE) ×1 IMPLANT
CNTNR URN SCR LID CUP LEK RST (MISCELLANEOUS) IMPLANT
CONT SPEC 4OZ STRL OR WHT (MISCELLANEOUS) ×1
COVER PERINEAL POST (MISCELLANEOUS) ×1 IMPLANT
COVER SURGICAL LIGHT HANDLE (MISCELLANEOUS) ×1 IMPLANT
DRAPE STERI IOBAN 125X83 (DRAPES) ×1 IMPLANT
DRAPE U-SHAPE 47X51 STRL (DRAPES) ×2 IMPLANT
DRSG AQUACEL AG ADV 3.5X10 (GAUZE/BANDAGES/DRESSINGS) ×1 IMPLANT
DURAPREP 26ML APPLICATOR (WOUND CARE) ×1 IMPLANT
ELECT BLADE TIP CTD 4 INCH (ELECTRODE) ×1 IMPLANT
ELECT REM PT RETURN 15FT ADLT (MISCELLANEOUS) ×1 IMPLANT
ELIMINATOR HOLE APEX DEPUY (Hips) IMPLANT
GLOVE BIO SURGEON STRL SZ7.5 (GLOVE) ×1 IMPLANT
GLOVE BIO SURGEON STRL SZ8.5 (GLOVE) ×1 IMPLANT
GLOVE BIOGEL PI IND STRL 8 (GLOVE) ×1 IMPLANT
GLOVE BIOGEL PI IND STRL 9 (GLOVE) ×1 IMPLANT
GOWN STRL REUS W/ TWL XL LVL3 (GOWN DISPOSABLE) ×2 IMPLANT
GOWN STRL REUS W/TWL XL LVL3 (GOWN DISPOSABLE) ×2
HEAD M SROM 36MM PLUS 1.5 (Hips) IMPLANT
HOLDER FOLEY CATH W/STRAP (MISCELLANEOUS) ×1 IMPLANT
KIT TURNOVER KIT A (KITS) IMPLANT
MANIFOLD NEPTUNE II (INSTRUMENTS) ×1 IMPLANT
NDL HYPO 21X1.5 SAFETY (NEEDLE) ×2 IMPLANT
NEEDLE HYPO 21X1.5 SAFETY (NEEDLE) ×2 IMPLANT
NS IRRIG 1000ML POUR BTL (IV SOLUTION) ×1 IMPLANT
PACK ANTERIOR HIP CUSTOM (KITS) ×1 IMPLANT
PIN SECT CUP 56MM (Hips) IMPLANT
PINNACLE ALTRX PLUS 4 N 36X56 (Hips) IMPLANT
SPIKE FLUID TRANSFER (MISCELLANEOUS) ×1 IMPLANT
SROM M HEAD 36MM PLUS 1.5 (Hips) ×1 IMPLANT
STEM FEMORAL SZ 6MM STD ACTIS (Stem) IMPLANT
SUT VIC AB 0 CT1 27 (SUTURE) ×1
SUT VIC AB 0 CT1 27XBRD ANBCTR (SUTURE) ×1 IMPLANT
SUT VIC AB 1 CTX 36 (SUTURE) ×1
SUT VIC AB 1 CTX36XBRD ANBCTR (SUTURE) ×1 IMPLANT
SUT VIC AB 2-0 CT1 27 (SUTURE) ×1
SUT VIC AB 2-0 CT1 TAPERPNT 27 (SUTURE) ×1 IMPLANT
SUT VICRYL+ 3-0 36IN CT-1 (SUTURE) ×1 IMPLANT
SYR CONTROL 10ML LL (SYRINGE) ×3 IMPLANT
TRAY FOLEY MTR SLVR 16FR STAT (SET/KITS/TRAYS/PACK) IMPLANT
TRAY FOLEY W/BAG SLVR 14FR LF (SET/KITS/TRAYS/PACK) IMPLANT
TUBE SUCTION HIGH CAP CLEAR NV (SUCTIONS) ×1 IMPLANT
WATER STERILE IRR 1000ML POUR (IV SOLUTION) IMPLANT

## 2023-02-17 NOTE — Plan of Care (Signed)
Problem: Education: Goal: Knowledge of General Education information will improve Description: Including pain rating scale, medication(s)/side effects and non-pharmacologic comfort measures Outcome: Progressing   Problem: Clinical Measurements: Goal: Ability to maintain clinical measurements within normal limits will improve Outcome: Progressing   Problem: Pain Management: Goal: General experience of comfort will improve Outcome: Progressing   Problem: Safety: Goal: Ability to remain free from injury will improve Outcome: Progressing   Haydee Salter, RN 02/17/23 11:35 AM

## 2023-02-17 NOTE — Transfer of Care (Signed)
Immediate Anesthesia Transfer of Care Note  Patient: Teresa Holloway  Procedure(s) Performed: RIGHT TOTAL HIP ARTHROPLASTY ANTERIOR APPROACH (Right: Hip)  Patient Location: PACU  Anesthesia Type:Spinal  Level of Consciousness: drowsy and patient cooperative  Airway & Oxygen Therapy: Patient Spontanous Breathing and Patient connected to face mask oxygen  Post-op Assessment: Report given to RN and Post -op Vital signs reviewed and stable  Post vital signs: Reviewed and stable  Last Vitals:  Vitals Value Taken Time  BP 99/54 02/17/23 0940  Temp    Pulse 57 02/17/23 0943  Resp 19 02/17/23 0943  SpO2 100 % 02/17/23 0943  Vitals shown include unfiled device data.  Last Pain:  Vitals:   02/17/23 0619  TempSrc: Oral  PainSc:          Complications: No notable events documented.

## 2023-02-17 NOTE — Anesthesia Procedure Notes (Signed)
Spinal  Patient location during procedure: OR Start time: 02/17/2023 7:50 AM End time: 02/17/2023 7:53 AM Reason for block: surgical anesthesia Staffing Performed: anesthesiologist  Anesthesiologist: Elmer Picker, MD Performed by: Elmer Picker, MD Authorized by: Elmer Picker, MD   Preanesthetic Checklist Completed: patient identified, IV checked, risks and benefits discussed, surgical consent, monitors and equipment checked, pre-op evaluation and timeout performed Spinal Block Patient position: sitting Prep: DuraPrep and site prepped and draped Patient monitoring: cardiac monitor, continuous pulse ox and blood pressure Approach: midline Location: L3-4 Injection technique: single-shot Needle Needle type: Pencan  Needle gauge: 24 G Needle length: 9 cm Assessment Sensory level: T6 Events: CSF return Additional Notes Functioning IV was confirmed and monitors were applied. Sterile prep and drape, including hand hygiene and sterile gloves were used. The patient was positioned and the spine was prepped. The skin was anesthetized with lidocaine.  Free flow of clear CSF was obtained prior to injecting local anesthetic into the CSF.  The spinal needle aspirated freely following injection.  The needle was carefully withdrawn.  The patient tolerated the procedure well.

## 2023-02-17 NOTE — Discharge Instructions (Signed)

## 2023-02-17 NOTE — Evaluation (Signed)
Physical Therapy Evaluation Patient Details Name: Teresa Holloway MRN: [id number] DOB: [birth year] Today's Date: 02/17/2023  History of Present Illness  82 yo female s/p R THA on 02/17/23. PMH: L TKA, MI, HTN, chronic opioid dependence  Clinical Impression  Pt is s/p THA resulting in the deficits listed below (see PT Problem List).  PT intermittently confused per RN. Pt pleasant and able to follow one step commands with incr time. Amb 34' with RW and min assist. Anticipate steady progress   Pt will benefit from acute skilled PT to increase their independence and safety with mobility to facilitate discharge.          If plan is discharge home, recommend the following: A little help with walking and/or transfers;A little help with bathing/dressing/bathroom;Assist for transportation;Help with stairs or ramp for entrance;Assistance with cooking/housework   Can travel by private vehicle        Equipment Recommendations None recommended by PT  Recommendations for Other Services       Functional Status Assessment Patient has had a recent decline in their functional status and demonstrates the ability to make significant improvements in function in a reasonable and predictable amount of time.     Precautions / Restrictions Precautions Precautions: Fall Restrictions RLE Weight Bearing: Weight bearing as tolerated      Mobility  Bed Mobility Overal bed mobility: Needs Assistance Bed Mobility: Supine to Sit     Supine to sit: Min assist     General bed mobility comments: assist with RLE    Transfers Overall transfer level: Needs assistance Equipment used: Rolling walker (2 wheels) Transfers: Sit to/from Stand Sit to Stand: Min assist           General transfer comment: cues for hand placement, RLE position and safety. assist to power up and transition to RW    Ambulation/Gait Ambulation/Gait assistance: Min assist, Contact guard assist Gait Distance (Feet): 20  Feet Assistive device: Rolling walker (2 wheels) Gait Pattern/deviations: Step-to pattern, Decreased stance time - right       General Gait Details: verbal cues for sequence, posture and RW safety  Stairs            Wheelchair Mobility     Tilt Bed    Modified Rankin (Stroke Patients Only)       Balance                                             Pertinent Vitals/Pain Pain Assessment Pain Assessment: 0-10 Pain Score: 7  Pain Location: right hip Pain Descriptors / Indicators: Aching, Sore, Burning Pain Intervention(s): Limited activity within patient's tolerance, Monitored during session, Premedicated before session, Repositioned, Ice applied    Home Living Family/patient expects to be discharged to:: Private residence     Type of Home: Mobile home Home Access: Stairs to enter Entrance Stairs-Rails: Right;Left;Can reach both Entrance Stairs-Number of Steps: 4   Home Layout: One level Home Equipment: Cane - quad;Cane - single Librarian, academic (2 wheels);Rollator (4 wheels) Additional Comments: son and dtr-in-law    Prior Function Prior Level of Function : Independent/Modified Independent                     Extremity/Trunk Assessment   Upper Extremity Assessment Upper Extremity Assessment: Overall WFL for tasks assessed    Lower Extremity Assessment Lower Extremity Assessment: RLE deficits/detail  RLE Deficits / Details: AAROM grossly WFL, strength 3/5, limited by post op pain       Communication   Communication Communication: No apparent difficulties  Cognition Arousal: Alert Behavior During Therapy: WFL for tasks assessed/performed Overall Cognitive Status: Impaired/Different from baseline Area of Impairment: Following commands, Memory, Orientation                 Orientation Level: Situation   Memory: Decreased short-term memory Following Commands: Follows one step commands with increased time        General Comments: intermittently unsure why hip was painful, occasional redirection to task needed        General Comments      Exercises Total Joint Exercises Ankle Circles/Pumps: AROM, Both, 5 reps   Assessment/Plan    PT Assessment Patient needs continued PT services  PT Problem List Decreased strength;Decreased activity tolerance;Decreased balance;Decreased mobility;Decreased knowledge of precautions;Decreased knowledge of use of DME;Pain       PT Treatment Interventions DME instruction;Gait training;Stair training;Functional mobility training;Therapeutic activities;Therapeutic exercise;Patient/family education    PT Goals (Current goals can be found in the Care Plan section)  Acute Rehab PT Goals PT Goal Formulation: With patient Time For Goal Achievement: 02/24/23 Potential to Achieve Goals: Good    Frequency 7X/week     Co-evaluation               AM-PAC PT "6 Clicks" Mobility  Outcome Measure Help needed turning from your back to your side while in a flat bed without using bedrails?: A Little Help needed moving from lying on your back to sitting on the side of a flat bed without using bedrails?: A Little Help needed moving to and from a bed to a chair (including a wheelchair)?: A Little Help needed standing up from a chair using your arms (e.g., wheelchair or bedside chair)?: A Little Help needed to walk in hospital room?: A Little Help needed climbing 3-5 steps with a railing? : A Little 6 Click Score: 18    End of Session Equipment Utilized During Treatment: Gait belt Activity Tolerance: Patient tolerated treatment well Patient left: with call bell/phone within reach;with chair alarm set;in chair Nurse Communication: Mobility status PT Visit Diagnosis: Other abnormalities of gait and mobility (R26.89);Difficulty in walking, not elsewhere classified (R26.2)    Time:  -      Charges:                 Delice Bison, PT  Acute Rehab Dept [facility])  [phone]  02/17/2023   [facility] 02/17/2023, 3:59 PM

## 2023-02-17 NOTE — Interval H&P Note (Signed)
History and Physical Interval Note:  02/17/2023 7:23 AM  Teresa Holloway  has presented today for surgery, with the diagnosis of RIGHT HIP OSTEOARTHRITIS.  The various methods of treatment have been discussed with the patient and family. After consideration of risks, benefits and other options for treatment, the patient has consented to  Procedure(s): RIGHT TOTAL HIP ARTHROPLASTY ANTERIOR APPROACH (Right) as a surgical intervention.  The patient's history has been reviewed, patient examined, no change in status, stable for surgery.  I have reviewed the patient's chart and labs.  Questions were answered to the patient's satisfaction.     Nestor Lewandowsky

## 2023-02-17 NOTE — Anesthesia Preprocedure Evaluation (Addendum)
Anesthesia Evaluation  Patient identified by MRN, date of birth, ID band Patient awake    Reviewed: Allergy & Precautions, NPO status , Patient's Chart, lab work & pertinent test results, reviewed documented beta blocker date and time   Airway Mallampati: III  TM Distance: >3 FB Neck ROM: Full    Dental  (+) Edentulous Upper, Upper Dentures, Dental Advisory Given   Pulmonary neg pulmonary ROS   Pulmonary exam normal breath sounds clear to auscultation       Cardiovascular hypertension, Pt. on medications and Pt. on home beta blockers + CAD and + Past MI  Normal cardiovascular exam Rhythm:Regular Rate:Normal     Neuro/Psych  PSYCHIATRIC DISORDERS Anxiety Depression    negative neurological ROS     GI/Hepatic Neg liver ROS,GERD  ,,  Endo/Other  negative endocrine ROS    Renal/GU negative Renal ROS  negative genitourinary   Musculoskeletal  (+) Arthritis ,    Abdominal   Peds  Hematology  (+) Blood dyscrasia, anemia   Anesthesia Other Findings   Reproductive/Obstetrics                             Anesthesia Physical Anesthesia Plan  ASA: 3  Anesthesia Plan: Spinal   Post-op Pain Management: Tylenol PO (pre-op)*   Induction:   PONV Risk Score and Plan: 2 and Treatment may vary due to age or medical condition, Propofol infusion, Ondansetron and Dexamethasone  Airway Management Planned: Natural Airway  Additional Equipment:   Intra-op Plan:   Post-operative Plan:   Informed Consent: I have reviewed the patients History and Physical, chart, labs and discussed the procedure including the risks, benefits and alternatives for the proposed anesthesia with the patient or authorized representative who has indicated his/her understanding and acceptance.     Dental advisory given  Plan Discussed with: CRNA  Anesthesia Plan Comments:        Anesthesia Quick Evaluation

## 2023-02-17 NOTE — Anesthesia Procedure Notes (Signed)
Procedure Name: MAC Date/Time: 02/17/2023 7:43 AM  Performed by: Maurene Capes, CRNAPre-anesthesia Checklist: Patient identified, Emergency Drugs available, Suction available and Patient being monitored Patient Re-evaluated:Patient Re-evaluated prior to induction Oxygen Delivery Method: Simple face mask Preoxygenation: Pre-oxygenation with 100% oxygen Induction Type: IV induction Placement Confirmation: positive ETCO2 Dental Injury: Teeth and Oropharynx as per pre-operative assessment

## 2023-02-17 NOTE — Op Note (Signed)
PATIENT ID:      Teresa Holloway  MRN:     [id number] DOB/AGE:    Dec 04, 1940 / 82 y.o.  OPERATIVE REPORT   DATE OF PROCEDURE:  02/17/2023      PREOPERATIVE DIAGNOSIS:  RIGHT HIP OSTEOARTHRITIS                                                         POSTOPERATIVE DIAGNOSIS:  Same                                                         PROCEDURE: Anterior R total hip arthroplasty using a 56 mm DePuy Pinnacle  Cup, [employer], 0-degree polyethylene liner, a +1.5 mm x 36mm metal head, a 6std Depuy Actis stem  SURGEON: Nestor Lewandowsky  ASSISTANT:   Tomi Likens. [employer]  (present throughout entire procedure and necessary for timely completion of the procedure)   ANESTHESIA: Spinal, Exparel 133mg  injection BLOOD LOSS: 400 cc FLUID REPLACEMENT: 1600 cc crystalloid TRANEXAMIC ACID: 1gm IV, 2gm Topical COMPLICATIONS: none    INDICATIONS FOR PROCEDURE: A 82 y.o. year-old With  RIGHT HIP OSTEOARTHRITIS   for 3 years, x-rays show bone-on-bone arthritic changes, and osteophytes. Despite conservative measures with observation, anti-inflammatory medicine, narcotics, use of a cane, has severe unremitting pain and can ambulate only a few blocks before resting. Patient desires elective R total hip arthroplasty to decrease pain and increase function. The risks, benefits, and alternatives were discussed at length including but not limited to the risks of infection, bleeding, nerve injury, stiffness, blood clots, the need for revision surgery, cardiopulmonary complications, among others, and they were willing to proceed. Questions answered      PROCEDURE IN DETAIL: The patient was identified by armband, received preoperative IV antibiotics, in the holding area at [facility], taken to the operating room , appropriate anesthetic monitors were attached and anesthesia was induced with the patient on the gurney. HANA boots were applied to the feet, and the patient  was transferred to the HANA  table with a peroneal post and support underneath the non-operative leg. The operative lower extremity was then prepped and draped in the usual sterile fashion from just above the iliac crest to the knee. And a timeout procedure was performed. Tomi Likens. [facility] was present and scrubbed throughout the case, critical for assistance with, positioning, exposure, retraction, instrumentation, and closure.Skin along incision area was injected with 10 cc of Exparel solution. We then made a 13 cm incision along the interval at the leading edge of the tensor fascia lata of starting at 2 cm lateral to the ASIS. Small bleeders in the skin and subcutaneous tissue identified and cauterized we dissected down to the fascia and made an incision in the fascia allowing [place] to elevate the fascia of the tensor muscle and exploited the interval between the rectus and the tensor fascia lata. A Cobra retractor was then placed along the superior neck of the femur. A cerebellar retractor was used to expose the interval between the tensor fascia lata and the rectus femoris.  We identified and cauterized the ascending  branch of the anterior circumflex artery. A second Cobra retractor along the inferior neck of the femur. A small Hohmann retractor was placed underneath the origin of the rectus femoris, giving [place] good medial exposure. Using Ronguers fatty tissue was removed from in front of the anterior capsule. The capsule was then incised, starting out at the superior anterior rim of the acetabulum going laterally along the anterior neck. The capsule was then teed along the neck superiorly and inferiorly. Electrocautery was used to release capsule from the anterior and medial neck of the femur to allow external rotation. The cobra retractors were then placed along the inferior and superior neck. The hip was externally rotated to 40 degrees, traction applied and locked. We perform a standard neck cut with the oscillating saw and removed the  femoral head with a power corkscrew. We then placed a medium curved medium homan retractor in the cotyloid notch and standard cobra retractor posteriorly along the acetabular rim. Exposed labral tissue and osteophytes were then removed. We then sequentially reamed up to a 55 mm basket reamer obtaining good coverage in all quadrants, verified by C-arm imaging. Under C-arm control we then hammered into place a 54 mm Pinnacle cup in 45 of abduction and 15 of anteversion. The cup seated nicely and required no supplemental screws. We then placed a central hole Eliminator and a +4 mm, 0 polyethylene liner. The foot was then externally rotated to 130-140. The limb was extended and adducted to the floor, delivering the proximal femur up into the wound. A medium curved Hohmann retractor was placed over the greater trochanter and a long Homan retractor along the posterior femoral neck completing the exposure and lateralizing the femur. We then performed releases superiorly and and inferiorly of the capsule going back to the pirformis fossa superiorly and to the lesser trochanter inferiorly. We then entered the proximal femur with the box cutting offset chisel followed by, a canal sounder, the chili pepper and broaching up to a 6 broach. This seated nicely and we reamed the calcar. A trial reduction was performed with a 1.5 mm X 36 mm head.The limb lengths were checked by c-arm, and the hip was stable in 90 of external rotation. At this point the trial components removed and we hammered into place a Std  Offset # 6Actis stem with coating. A + 1.5 mm x 36 metalhead was then hammered into place. The hip was reduced and final C-arm images obtained. The wound was thoroughly irrigated with normal saline solution. We repaired the ant capsule and the tensor fascia lot a with running 0 vicryl suture. the subcutaneous and subcuticular layers were closed with running 3-0 Vicryl suture followed by an Aquacil dressing. At this point  the patient was awaken and transferred to hospital gurney without difficulty.   Nestor Lewandowsky 02/17/2023, 7:23 AM

## 2023-02-17 NOTE — Anesthesia Postprocedure Evaluation (Signed)
Anesthesia Post Note  Patient: Teresa Holloway  Procedure(s) Performed: RIGHT TOTAL HIP ARTHROPLASTY ANTERIOR APPROACH (Right: Hip)     Patient location during evaluation: PACU Anesthesia Type: Spinal Level of consciousness: oriented and awake and alert Pain management: pain level controlled Vital Signs Assessment: post-procedure vital signs reviewed and stable Respiratory status: spontaneous breathing, respiratory function stable and patient connected to nasal cannula oxygen Cardiovascular status: blood pressure returned to baseline and stable Postop Assessment: no headache, no backache and no apparent nausea or vomiting Anesthetic complications: no  No notable events documented.  Last Vitals:  Vitals:   02/17/23 1045 02/17/23 1102  BP: (!) 116/58 121/63  Pulse: (!) 59 (!) 56  Resp: 14 15  Temp:  36.4 C  SpO2: 95% 99%    Last Pain:  Vitals:   02/17/23 1102  TempSrc: Oral  PainSc:     LLE Motor Response: Purposeful movement (02/17/23 1130) LLE Sensation: Decreased (02/17/23 1130) RLE Motor Response: Purposeful movement (02/17/23 1130) RLE Sensation: Decreased (02/17/23 1130)      Notnamed Croucher L Arcadio Cope

## 2023-02-18 ENCOUNTER — Encounter (HOSPITAL_COMMUNITY): Payer: Self-pay | Admitting: Orthopedic Surgery

## 2023-02-18 DIAGNOSIS — I10 Essential (primary) hypertension: Secondary | ICD-10-CM | POA: Diagnosis not present

## 2023-02-18 DIAGNOSIS — I2510 Atherosclerotic heart disease of native coronary artery without angina pectoris: Secondary | ICD-10-CM | POA: Diagnosis not present

## 2023-02-18 DIAGNOSIS — I252 Old myocardial infarction: Secondary | ICD-10-CM | POA: Diagnosis not present

## 2023-02-18 DIAGNOSIS — M1611 Unilateral primary osteoarthritis, right hip: Secondary | ICD-10-CM | POA: Diagnosis not present

## 2023-02-18 NOTE — Discharge Summary (Signed)
Patient ID: Teresa Holloway MRN: [id number] DOB/AGE: [birth year] 81 y.o.  Admit date: 02/17/2023 Discharge date: 02/18/2023  Admission Diagnoses:  Principal Problem:   Osteoarthritis of right hip Active Problems:   S/P total right hip arthroplasty   Discharge Diagnoses:  Same  Past Medical History:  Diagnosis Date   Anxiety    Arthritis    Knees, hips   CAD (coronary artery disease)    Chronic back pain    Depression    GERD (gastroesophageal reflux disease)    Hyperlipidemia    Hypertension    MI (myocardial infarction) (HCC)    Osteopenia     Surgeries: Procedure(s): RIGHT TOTAL HIP ARTHROPLASTY ANTERIOR APPROACH on 02/17/2023   Consultants:   Discharged Condition: Improved  Hospital Course: JAELYNNE Holloway is an 82 y.o. female who was admitted 02/17/2023 for operative treatment ofOsteoarthritis of right hip. Patient has severe unremitting pain that affects sleep, daily activities, and work/hobbies. After pre-op clearance the patient was taken to the operating room on 02/17/2023 and underwent  Procedure(s): RIGHT TOTAL HIP ARTHROPLASTY ANTERIOR APPROACH.    Patient was given perioperative antibiotics:  Anti-infectives (From admission, onward)    Start     Dose/Rate Route Frequency Ordered Stop   02/17/23 0600  ceFAZolin (ANCEF) IVPB 2g/100 mL premix        2 g 200 mL/hr over 30 Minutes Intravenous On call to O.R. 02/17/23 7829 02/17/23 5621        Patient was given sequential compression devices, early ambulation, and chemoprophylaxis to prevent DVT.  Patient benefited maximally from hospital stay and there were no complications.    Recent vital signs: Patient Vitals for the past 24 hrs:  BP Temp Temp src Pulse Resp SpO2  02/18/23 1350 (!) 105/50 98.3 F (36.8 C) Oral (!) 58 14 96 %  02/18/23 0954 103/60 98.5 F (36.9 C) Oral 62 18 96 %  02/18/23 0854 (!) 98/51 98.6 F (37 C) Oral 64 18 95 %  02/18/23 0604 (!) 112/56 98.1 F (36.7 C) Oral 69 17  96 %  02/17/23 2232 117/64 99.1 F (37.3 C) Oral 76 16 93 %  02/17/23 1739 122/61 98.4 F (36.9 C) Oral 69 16 95 %  02/17/23 1550 138/65 -- -- -- -- --     Recent laboratory studies: No results for input(s): "WBC", "HGB", "HCT", "PLT", "NA", "K", "CL", "CO2", "BUN", "CREATININE", "GLUCOSE", "INR", "CALCIUM" in the last 72 hours.  Invalid input(s): "PT", "2"   Discharge Medications:      Diagnostic Studies: DG HIP UNILAT WITH PELVIS 1V RIGHT  Result Date: 02/17/2023 CLINICAL DATA:  Elective surgery. EXAM: DG HIP (WITH OR WITHOUT PELVIS) 1V RIGHT COMPARISON:  None Available. FINDINGS: Nine fluoroscopic spot views of the pelvis and right hip obtained in the operating room. Sequential images during hip arthroplasty. Fluoroscopy time 7 seconds. Dose 0.752 mGy. IMPRESSION: Intraoperative fluoroscopy during right hip arthroplasty. Electronically Signed   By: Narda Rutherford M.D.   On: 02/17/2023 09:57   DG C-Arm 1-60 Min-No Report  Result Date: 02/17/2023 Fluoroscopy was utilized by the requesting physician.  No radiographic interpretation.   DG C-Arm 1-60 Min-No Report  Result Date: 02/17/2023 Fluoroscopy was utilized by the requesting physician.  No radiographic interpretation.    Disposition: Discharge disposition: 01-Home or Self Care       Discharge Instructions     Call MD / Call 911   Complete by: As directed    If you experience chest pain or shortness  of breath, CALL 911 and be transported to the hospital emergency room.  If you develope a fever above 101 F, pus (white drainage) or increased drainage or redness at the wound, or calf pain, call your surgeon's office.   Constipation Prevention   Complete by: As directed    Drink plenty of fluids.  Prune juice may be helpful.  You may use a stool softener, such as Colace (over the counter) 100 mg twice a day.  Use MiraLax (over the counter) for constipation as needed.   Diet - low sodium heart healthy   Complete by: As  directed    Driving restrictions   Complete by: As directed    No driving for 2 weeks   Increase activity slowly as tolerated   Complete by: As directed    Patient may shower   Complete by: As directed    You may shower without a dressing once there is no drainage.  Do not wash over the wound.  If drainage remains, cover wound with plastic wrap and then shower.   Post-operative opioid taper instructions:   Complete by: As directed    POST-OPERATIVE OPIOID TAPER INSTRUCTIONS: It is important to wean off of your opioid medication as soon as possible. If you do not need pain medication after your surgery it is ok to stop day one. Opioids include: Codeine, Hydrocodone(Norco, Vicodin), Oxycodone(Percocet, oxycontin) and hydromorphone amongst others.  Long term and even short term use of opiods can cause: Increased pain response Dependence Constipation Depression Respiratory depression And more.  Withdrawal symptoms can include Flu like symptoms Nausea, vomiting And more Techniques to manage these symptoms Hydrate well Eat regular healthy meals Stay active Use relaxation techniques(deep breathing, meditating, yoga) Do Not substitute Alcohol to help with tapering If you have been on opioids for less than two weeks and do not have pain than it is ok to stop all together.  Plan to wean off of opioids This plan should start within one week post op of your joint replacement. Maintain the same interval or time between taking each dose and first decrease the dose.  Cut the total daily intake of opioids by one tablet each day Next start to increase the time between doses. The last dose that should be eliminated is the evening dose.      Weight bearing as tolerated   Complete by: As directed         Follow-up Information     Gean Birchwood, MD. Go on 02/27/2023.   Specialty: Orthopedic Surgery Why: Your appointment is scheduled for 1000 Contact information: [address] [place] [address] [phone]         [facility] Follow up.   Why: HHPT and OT will schedule 6 visits with you prior to starting outpatient phyiscal therapy        [facility] [place]. Go on 02/28/2023.   Why: Your outpatient phyiscal therapy has been scheduled. The office will call you wtih a time Contact information: [phone]                 Signed: Dannielle Burn 02/18/2023, 2:10 PM

## 2023-02-18 NOTE — TOC Transition Note (Signed)
Transition of Care [facility]) - CM/SW Discharge Note   Patient Details  Name: Teresa Holloway MRN: [id number] Date of Birth: [birth year]  [facility] CM/SW Contact:  Amada Jupiter, LCSW Phone Number: 02/18/2023, 10:56 AM   Clinical Narrative:    Met with pt to review dc arrangements.  Noted pt was to receive RW to room via [employer], however, agency noted they had provided pt with a RW in 2020 - pt confirms she does have a RW at home. HHPT prearranged with [facility] via MD office.  No further TOC needs.   Final next level of care: Home w Home Health Services Barriers to Discharge: No Barriers Identified   Patient Goals and CMS Choice      Discharge Placement                         Discharge Plan and Services Additional resources added to the After Visit Summary for                  DME Arranged: N/A DME Agency: NA       HH Arranged: PT HH Agency: [facility]        Social Determinants of Health (SDOH) Interventions SDOH Screenings   Food Insecurity: No Food Insecurity (02/17/2023)  Housing: Low Risk  (02/17/2023)  Transportation Needs: No Transportation Needs (02/17/2023)  Utilities: Not At Risk (02/17/2023)  Alcohol Screen: Low Risk  (08/21/2022)  Depression (PHQ2-9): Medium Risk (02/11/2023)  Financial Resource Strain: Low Risk  (08/21/2022)  Physical Activity: Sufficiently Active (08/21/2022)  Social Connections: Moderately Isolated (08/21/2022)  Stress: No Stress Concern Present (08/21/2022)  Tobacco Use: Low Risk  (02/17/2023)     Readmission Risk Interventions     No data to display

## 2023-02-18 NOTE — Progress Notes (Signed)
Physical Therapy Treatment Patient Details Name: JARYIAH HENNES MRN: [id number] DOB: [birth year] Today's Date: 02/18/2023   History of Present Illness 82 yo female s/p R THA on 02/17/23. PMH: L TKA, MI, HTN, chronic opioid dependence    PT Comments  POD #1 AM Session Pt. Was confused during today's tx. She mentioned that it was Sunday today. Pt. Required verbal and tactile cues to demonstrate safe transfers and gait with RW. Pt. Performed a few seated LE exercises. Pt. will need another PT session for stair training with daughter present.   If plan is discharge home, recommend the following: Assist for transportation;Help with stairs or ramp for entrance;Assistance with cooking/housework;A lot of help with walking and/or transfers;A lot of help with bathing/dressing/bathroom   Can travel by private vehicle        Equipment Recommendations  None recommended by PT    Recommendations for Other Services       Precautions / Restrictions       Mobility  Bed Mobility Overal bed mobility: Needs Assistance Bed Mobility: Supine to Sit     Supine to sit: Contact guard     General bed mobility comments: assist with RLE    Transfers Overall transfer level: Needs assistance Equipment used: Rolling walker (2 wheels) Transfers: Sit to/from Stand (Stand to chair) Sit to Stand: Min assist           General transfer comment: cues for hand placement, RLE position and safety. assist to power up and transition to RW. Cues to get into position before trying to sit    Ambulation/Gait Ambulation/Gait assistance: Min assist, Contact guard assist Gait Distance (Feet): 30 Feet Assistive device: Rolling walker (2 wheels) Gait Pattern/deviations: Step-to pattern, Decreased stance time - right, Decreased step length - left, Decreased step length - right, Trunk flexed, Narrow base of support       General Gait Details: verbal cues for sequence, posture and RW safety   Stairs              Wheelchair Mobility     Tilt Bed    Modified Rankin (Stroke Patients Only)       Balance                                            Cognition Arousal: Alert Behavior During Therapy: Impulsive (Confused) Overall Cognitive Status: Impaired/Different from baseline Area of Impairment: Following commands, Memory, Orientation, Awareness, Problem solving                 Orientation Level: Person, Situation, Place, Disoriented to (Struggled with day of the week)   Memory: Decreased short-term memory Following Commands: Follows one step commands with increased time, Follows multi-step commands inconsistently     Problem Solving: Requires verbal cues, Requires tactile cues, Difficulty sequencing, Slow processing General Comments: Confused, Impulsive        Exercises Total Joint Exercises Ankle Circles/Pumps: AROM, 10 reps, Seated, Right Quad Sets: AROM, 10 reps, Right, Seated Short Arc Quad: AAROM, AROM, Right, 10 reps, Seated Heel Slides: AAROM, Right, 10 reps, Supine, Seated Hip ABduction/ADduction: AAROM, Right, 5 reps    General Comments        Pertinent Vitals/Pain Pain Assessment Pain Assessment: Faces Faces Pain Scale: Hurts even more Pain Location: right hip Pain Descriptors / Indicators: Other (Comment), Burning (stinging) Pain Intervention(s): Ice applied    Home  Living                          Prior Function            PT Goals (current goals can now be found in the care plan section) Acute Rehab PT Goals PT Goal Formulation: With patient Time For Goal Achievement: 02/24/23 Progress towards PT goals: Progressing toward goals    Frequency    7X/week      PT Plan      Co-evaluation              AM-PAC PT "6 Clicks" Mobility   Outcome Measure  Help needed turning from your back to your side while in a flat bed without using bedrails?: A Little Help needed moving from lying on your back  to sitting on the side of a flat bed without using bedrails?: A Little Help needed moving to and from a bed to a chair (including a wheelchair)?: A Little Help needed standing up from a chair using your arms (e.g., wheelchair or bedside chair)?: A Little Help needed to walk in hospital room?: A Little Help needed climbing 3-5 steps with a railing? : A Lot 6 Click Score: 17    End of Session Equipment Utilized During Treatment: Gait belt Activity Tolerance: Patient tolerated treatment well Patient left: with call bell/phone within reach;with chair alarm set;in chair Nurse Communication: Mobility status PT Visit Diagnosis: Other abnormalities of gait and mobility (R26.89);Difficulty in walking, not elsewhere classified (R26.2)     Time: 1610-9604 PT Time Calculation (min) (ACUTE ONLY): 25 min  Charges:    $Gait Training: 8-22 mins $Therapeutic Exercise: 8-22 mins PT General Charges $$ ACUTE PT VISIT: 1 Visit                     Lazaro Arms, SPTA 02/18/2023, 3:26 PM

## 2023-02-18 NOTE — Progress Notes (Signed)
PATIENT ID: Teresa Holloway  MRN: [id number]  DOB/AGE:  09/22/1940 / 82 y.o.  1 Day Post-Op Procedure(s) (LRB): RIGHT TOTAL HIP ARTHROPLASTY ANTERIOR APPROACH (Right)    PROGRESS NOTE Subjective: Patient is alert, oriented, no Nausea, no Vomiting, yes passing gas, . Taking PO well. Denies SOB, Chest or Calf Pain. Using Incentive Spirometer, PAS in place. Ambulate 20' Patient reports pain as  0/10  .    Objective: Vital signs in last 24 hours: Vitals:   02/17/23 1550 02/17/23 1739 02/17/23 2232 02/18/23 0604  BP: 138/65 122/61 117/64 (!) 112/56  Pulse:  69 76 69  Resp:  16 16 17   Temp:  98.4 F (36.9 C) 99.1 F (37.3 C) 98.1 F (36.7 C)  TempSrc:  Oral Oral Oral  SpO2:  95% 93% 96%  Weight:      Height:          Intake/Output from previous day: I/O last 3 completed shifts: In: 2729.6 [P.O.:540; I.V.:1889.6; IV Piggyback:300] Out: 1715 [Urine:1565; Blood:150]   Intake/Output this shift: No intake/output data recorded.   LABORATORY DATA: No results for input(s): "WBC", "HGB", "HCT", "PLT", "NA", "K", "CL", "CO2", "BUN", "CREATININE", "GLUCOSE", "GLUCAP", "INR", "CALCIUM" in the last 72 hours.  Invalid input(s): "PT", "2"  Examination: Neurologically intact ABD soft Neurovascular intact Sensation intact distally Intact pulses distally Dorsiflexion/Plantar flexion intact Incision: dressing C/D/I No cellulitis present Compartment soft} XR AP&Lat of hip shows well placed\fixed THA  Assessment:   1 Day Post-Op Procedure(s) (LRB): RIGHT TOTAL HIP ARTHROPLASTY ANTERIOR APPROACH (Right) ADDITIONAL DIAGNOSIS:  Teresa Holloway, HTN, hx CAD  Patient's anticipated LOS is less than 2 midnights, meeting these requirements: - Younger than 81 - Lives within 1 hour of care - Has a competent adult at home to recover with post-op recover - NO history of  - Chronic pain requiring opiods  - Diabetes  - Coronary Artery Disease  - Heart failure  - Heart attack  - Stroke  - DVT/VTE  -  Cardiac arrhythmia  - Respiratory Failure/COPD  - Renal failure  - Anemia  - Advanced Liver disease     Plan: PT/OT WBAT, THA  DVT Prophylaxis: SCDx72 hrs, ASA 81 mg BID x 2 weeks  DISCHARGE PLAN: Home, today If she passes physical therapy  DISCHARGE NEEDS: HHPT, Walker, and 3-in-1 comode seatPatient ID: Teresa Holloway, female   DOB: 12-28-1940, 82 y.o.   MRN: [id number]

## 2023-02-18 NOTE — Progress Notes (Signed)
Physical Therapy Treatment Patient Details Name: Teresa Holloway MRN: [id number] DOB: 1940/06/15 Today's Date: 02/18/2023   History of Present Illness 82 yo female s/p R THA on 02/17/23. PMH: L TKA, MI, HTN, chronic opioid dependence    PT Comments  POD #1 PM tx session Daughter present for family education on stair training and mobility, as well as HEP. Pt. Has met mobility goals to D/C to home today.    If plan is discharge home, recommend the following: Assist for transportation;Help with stairs or ramp for entrance;Assistance with cooking/housework;A lot of help with walking and/or transfers;A lot of help with bathing/dressing/bathroom   Can travel by private vehicle        Equipment Recommendations  None recommended by PT    Recommendations for Other Services       Precautions / Restrictions       Mobility  Bed Mobility Overal bed mobility: Needs Assistance Bed Mobility: Supine to Sit     Supine to sit: Contact guard     General bed mobility comments: OOB in recliner    Transfers Overall transfer level: Needs assistance Equipment used: Rolling walker (2 wheels) Transfers: Sit to/from Stand (Stand to chair) Sit to Stand: Min assist           General transfer comment: Cues for saftey during transfers, turn completion, and hand placement    Ambulation/Gait Ambulation/Gait assistance: Min assist, Contact guard assist Gait Distance (Feet): 10 Feet Assistive device: Rolling walker (2 wheels) Gait Pattern/deviations: Step-to pattern, Decreased stance time - right, Decreased step length - left, Decreased step length - right, Trunk flexed, Narrow base of support       General Gait Details: verbal cues for sequence, posture and RW safety   Stairs Stairs: Yes Stairs assistance: Mod assist, Max assist Stair Management: One rail Right, Step to pattern, Forwards, Sideways Number of Stairs: 2 General stair comments: trained the daughter on how to guard pt.  when going up & down stairs. Pt. was very hesitant when performing stair training. Daughter plans to have extra help at home   Wheelchair Mobility     Tilt Bed    Modified Rankin (Stroke Patients Only)       Balance                                            Cognition Arousal: Alert Behavior During Therapy: Impulsive (Confused) Overall Cognitive Status: Impaired/Different from baseline Area of Impairment: Following commands, Memory, Orientation, Awareness, Problem solving                 Orientation Level: Person, Situation, Place, Disoriented to (Struggled with day of the week)   Memory: Decreased short-term memory Following Commands: Follows one step commands with increased time, Follows multi-step commands inconsistently     Problem Solving: Requires verbal cues, Requires tactile cues, Difficulty sequencing, Slow processing General Comments: Confused, Impulsive        Exercises Total Joint Exercises Ankle Circles/Pumps: AROM, 10 reps, Seated, Right Quad Sets: AROM, 10 reps, Right, Seated Short Arc Quad: AAROM, AROM, Right, 10 reps, Seated Heel Slides: AAROM, Right, 10 reps, Supine, Seated Hip ABduction/ADduction: AAROM, Right, 5 reps    General Comments        Pertinent Vitals/Pain Pain Assessment Pain Assessment: Faces Faces Pain Scale: Hurts little more Pain Location: right hip Pain Descriptors / Indicators: Other (Comment),  Burning (stinging) Pain Intervention(s): Monitored during session, Repositioned, Ice applied, Premedicated before session    Home Living                          Prior Function            PT Goals (current goals can now be found in the care plan section) Acute Rehab PT Goals PT Goal Formulation: With patient Time For Goal Achievement: 02/24/23 Progress towards PT goals: Progressing toward goals    Frequency    7X/week      PT Plan      Co-evaluation              AM-PAC PT  "6 Clicks" Mobility   Outcome Measure  Help needed turning from your back to your side while in a flat bed without using bedrails?: A Little Help needed moving from lying on your back to sitting on the side of a flat bed without using bedrails?: A Little Help needed moving to and from a bed to a chair (including a wheelchair)?: A Little Help needed standing up from a chair using your arms (e.g., wheelchair or bedside chair)?: A Little Help needed to walk in hospital room?: A Little Help needed climbing 3-5 steps with a railing? : A Lot 6 Click Score: 17    End of Session Equipment Utilized During Treatment: Gait belt Activity Tolerance: Patient tolerated treatment well Patient left: with call bell/phone within reach;with chair alarm set;in chair Nurse Communication: Mobility status PT Visit Diagnosis: Other abnormalities of gait and mobility (R26.89);Difficulty in walking, not elsewhere classified (R26.2)     Time: 1914-7829 PT Time Calculation (min) (ACUTE ONLY): 25 min  Charges:    $Gait Training: 8-22 mins $Therapeutic Exercise: 8-22 mins PT General Charges $$ ACUTE PT VISIT: 1 Visit                      Lazaro Arms, SPTA 02/18/2023, 3:39 PM

## 2023-02-20 DIAGNOSIS — M549 Dorsalgia, unspecified: Secondary | ICD-10-CM | POA: Diagnosis not present

## 2023-02-20 DIAGNOSIS — G8929 Other chronic pain: Secondary | ICD-10-CM | POA: Diagnosis not present

## 2023-02-20 DIAGNOSIS — M150 Primary generalized (osteo)arthritis: Secondary | ICD-10-CM | POA: Diagnosis not present

## 2023-02-20 DIAGNOSIS — Z79891 Long term (current) use of opiate analgesic: Secondary | ICD-10-CM | POA: Diagnosis not present

## 2023-02-20 DIAGNOSIS — I2510 Atherosclerotic heart disease of native coronary artery without angina pectoris: Secondary | ICD-10-CM | POA: Diagnosis not present

## 2023-02-20 DIAGNOSIS — M8580 Other specified disorders of bone density and structure, unspecified site: Secondary | ICD-10-CM | POA: Diagnosis not present

## 2023-02-20 DIAGNOSIS — Z471 Aftercare following joint replacement surgery: Secondary | ICD-10-CM | POA: Diagnosis not present

## 2023-02-20 DIAGNOSIS — I252 Old myocardial infarction: Secondary | ICD-10-CM | POA: Diagnosis not present

## 2023-02-20 DIAGNOSIS — I10 Essential (primary) hypertension: Secondary | ICD-10-CM | POA: Diagnosis not present

## 2023-02-20 DIAGNOSIS — Z96641 Presence of right artificial hip joint: Secondary | ICD-10-CM | POA: Diagnosis not present

## 2023-02-20 DIAGNOSIS — Z556 Problems related to health literacy: Secondary | ICD-10-CM | POA: Diagnosis not present

## 2023-02-20 DIAGNOSIS — Z7982 Long term (current) use of aspirin: Secondary | ICD-10-CM | POA: Diagnosis not present

## 2023-02-20 DIAGNOSIS — Z9181 History of falling: Secondary | ICD-10-CM | POA: Diagnosis not present

## 2023-02-20 DIAGNOSIS — K219 Gastro-esophageal reflux disease without esophagitis: Secondary | ICD-10-CM | POA: Diagnosis not present

## 2023-02-20 DIAGNOSIS — Z96652 Presence of left artificial knee joint: Secondary | ICD-10-CM | POA: Diagnosis not present

## 2023-02-21 ENCOUNTER — Other Ambulatory Visit: Payer: Self-pay

## 2023-02-21 DIAGNOSIS — D509 Iron deficiency anemia, unspecified: Secondary | ICD-10-CM

## 2023-02-21 DIAGNOSIS — I2510 Atherosclerotic heart disease of native coronary artery without angina pectoris: Secondary | ICD-10-CM | POA: Diagnosis not present

## 2023-02-21 DIAGNOSIS — K219 Gastro-esophageal reflux disease without esophagitis: Secondary | ICD-10-CM | POA: Diagnosis not present

## 2023-02-21 DIAGNOSIS — M150 Primary generalized (osteo)arthritis: Secondary | ICD-10-CM | POA: Diagnosis not present

## 2023-02-21 DIAGNOSIS — Z96641 Presence of right artificial hip joint: Secondary | ICD-10-CM | POA: Diagnosis not present

## 2023-02-21 DIAGNOSIS — Z96652 Presence of left artificial knee joint: Secondary | ICD-10-CM | POA: Diagnosis not present

## 2023-02-21 DIAGNOSIS — I10 Essential (primary) hypertension: Secondary | ICD-10-CM | POA: Diagnosis not present

## 2023-02-21 DIAGNOSIS — Z471 Aftercare following joint replacement surgery: Secondary | ICD-10-CM | POA: Diagnosis not present

## 2023-02-21 DIAGNOSIS — Z556 Problems related to health literacy: Secondary | ICD-10-CM | POA: Diagnosis not present

## 2023-02-21 DIAGNOSIS — Z9181 History of falling: Secondary | ICD-10-CM | POA: Diagnosis not present

## 2023-02-21 DIAGNOSIS — Z7982 Long term (current) use of aspirin: Secondary | ICD-10-CM | POA: Diagnosis not present

## 2023-02-21 DIAGNOSIS — Z79891 Long term (current) use of opiate analgesic: Secondary | ICD-10-CM | POA: Diagnosis not present

## 2023-02-21 DIAGNOSIS — G8929 Other chronic pain: Secondary | ICD-10-CM | POA: Diagnosis not present

## 2023-02-21 DIAGNOSIS — M8580 Other specified disorders of bone density and structure, unspecified site: Secondary | ICD-10-CM | POA: Diagnosis not present

## 2023-02-21 DIAGNOSIS — I252 Old myocardial infarction: Secondary | ICD-10-CM | POA: Diagnosis not present

## 2023-02-21 DIAGNOSIS — M549 Dorsalgia, unspecified: Secondary | ICD-10-CM | POA: Diagnosis not present

## 2023-02-25 DIAGNOSIS — I2510 Atherosclerotic heart disease of native coronary artery without angina pectoris: Secondary | ICD-10-CM | POA: Diagnosis not present

## 2023-02-25 DIAGNOSIS — Z79891 Long term (current) use of opiate analgesic: Secondary | ICD-10-CM | POA: Diagnosis not present

## 2023-02-25 DIAGNOSIS — G8929 Other chronic pain: Secondary | ICD-10-CM | POA: Diagnosis not present

## 2023-02-25 DIAGNOSIS — M150 Primary generalized (osteo)arthritis: Secondary | ICD-10-CM | POA: Diagnosis not present

## 2023-02-25 DIAGNOSIS — Z96641 Presence of right artificial hip joint: Secondary | ICD-10-CM | POA: Diagnosis not present

## 2023-02-25 DIAGNOSIS — M8580 Other specified disorders of bone density and structure, unspecified site: Secondary | ICD-10-CM | POA: Diagnosis not present

## 2023-02-25 DIAGNOSIS — M549 Dorsalgia, unspecified: Secondary | ICD-10-CM | POA: Diagnosis not present

## 2023-02-25 DIAGNOSIS — Z7982 Long term (current) use of aspirin: Secondary | ICD-10-CM | POA: Diagnosis not present

## 2023-02-25 DIAGNOSIS — Z96652 Presence of left artificial knee joint: Secondary | ICD-10-CM | POA: Diagnosis not present

## 2023-02-25 DIAGNOSIS — Z471 Aftercare following joint replacement surgery: Secondary | ICD-10-CM | POA: Diagnosis not present

## 2023-02-25 DIAGNOSIS — K219 Gastro-esophageal reflux disease without esophagitis: Secondary | ICD-10-CM | POA: Diagnosis not present

## 2023-02-25 DIAGNOSIS — Z9181 History of falling: Secondary | ICD-10-CM | POA: Diagnosis not present

## 2023-02-25 DIAGNOSIS — I10 Essential (primary) hypertension: Secondary | ICD-10-CM | POA: Diagnosis not present

## 2023-02-25 DIAGNOSIS — I252 Old myocardial infarction: Secondary | ICD-10-CM | POA: Diagnosis not present

## 2023-02-25 DIAGNOSIS — Z556 Problems related to health literacy: Secondary | ICD-10-CM | POA: Diagnosis not present

## 2023-02-26 DIAGNOSIS — I2510 Atherosclerotic heart disease of native coronary artery without angina pectoris: Secondary | ICD-10-CM | POA: Diagnosis not present

## 2023-02-26 DIAGNOSIS — G8929 Other chronic pain: Secondary | ICD-10-CM | POA: Diagnosis not present

## 2023-02-26 DIAGNOSIS — M150 Primary generalized (osteo)arthritis: Secondary | ICD-10-CM | POA: Diagnosis not present

## 2023-02-26 DIAGNOSIS — Z79891 Long term (current) use of opiate analgesic: Secondary | ICD-10-CM | POA: Diagnosis not present

## 2023-02-26 DIAGNOSIS — Z471 Aftercare following joint replacement surgery: Secondary | ICD-10-CM | POA: Diagnosis not present

## 2023-02-26 DIAGNOSIS — K219 Gastro-esophageal reflux disease without esophagitis: Secondary | ICD-10-CM | POA: Diagnosis not present

## 2023-02-26 DIAGNOSIS — Z7982 Long term (current) use of aspirin: Secondary | ICD-10-CM | POA: Diagnosis not present

## 2023-02-26 DIAGNOSIS — I252 Old myocardial infarction: Secondary | ICD-10-CM | POA: Diagnosis not present

## 2023-02-26 DIAGNOSIS — Z96652 Presence of left artificial knee joint: Secondary | ICD-10-CM | POA: Diagnosis not present

## 2023-02-26 DIAGNOSIS — M549 Dorsalgia, unspecified: Secondary | ICD-10-CM | POA: Diagnosis not present

## 2023-02-26 DIAGNOSIS — Z556 Problems related to health literacy: Secondary | ICD-10-CM | POA: Diagnosis not present

## 2023-02-26 DIAGNOSIS — Z96641 Presence of right artificial hip joint: Secondary | ICD-10-CM | POA: Diagnosis not present

## 2023-02-26 DIAGNOSIS — I10 Essential (primary) hypertension: Secondary | ICD-10-CM | POA: Diagnosis not present

## 2023-02-26 DIAGNOSIS — Z9181 History of falling: Secondary | ICD-10-CM | POA: Diagnosis not present

## 2023-02-26 DIAGNOSIS — M8580 Other specified disorders of bone density and structure, unspecified site: Secondary | ICD-10-CM | POA: Diagnosis not present

## 2023-02-27 DIAGNOSIS — Z7982 Long term (current) use of aspirin: Secondary | ICD-10-CM | POA: Diagnosis not present

## 2023-02-27 DIAGNOSIS — K219 Gastro-esophageal reflux disease without esophagitis: Secondary | ICD-10-CM | POA: Diagnosis not present

## 2023-02-27 DIAGNOSIS — G8929 Other chronic pain: Secondary | ICD-10-CM | POA: Diagnosis not present

## 2023-02-27 DIAGNOSIS — Z556 Problems related to health literacy: Secondary | ICD-10-CM | POA: Diagnosis not present

## 2023-02-27 DIAGNOSIS — Z96641 Presence of right artificial hip joint: Secondary | ICD-10-CM | POA: Diagnosis not present

## 2023-02-27 DIAGNOSIS — I10 Essential (primary) hypertension: Secondary | ICD-10-CM | POA: Diagnosis not present

## 2023-02-27 DIAGNOSIS — M8580 Other specified disorders of bone density and structure, unspecified site: Secondary | ICD-10-CM | POA: Diagnosis not present

## 2023-02-27 DIAGNOSIS — Z96652 Presence of left artificial knee joint: Secondary | ICD-10-CM | POA: Diagnosis not present

## 2023-02-27 DIAGNOSIS — I2510 Atherosclerotic heart disease of native coronary artery without angina pectoris: Secondary | ICD-10-CM | POA: Diagnosis not present

## 2023-02-27 DIAGNOSIS — M1611 Unilateral primary osteoarthritis, right hip: Secondary | ICD-10-CM | POA: Diagnosis not present

## 2023-02-27 DIAGNOSIS — Z471 Aftercare following joint replacement surgery: Secondary | ICD-10-CM | POA: Diagnosis not present

## 2023-02-27 DIAGNOSIS — Z79891 Long term (current) use of opiate analgesic: Secondary | ICD-10-CM | POA: Diagnosis not present

## 2023-02-27 DIAGNOSIS — Z9181 History of falling: Secondary | ICD-10-CM | POA: Diagnosis not present

## 2023-02-27 DIAGNOSIS — I252 Old myocardial infarction: Secondary | ICD-10-CM | POA: Diagnosis not present

## 2023-02-27 DIAGNOSIS — M150 Primary generalized (osteo)arthritis: Secondary | ICD-10-CM | POA: Diagnosis not present

## 2023-02-27 DIAGNOSIS — M549 Dorsalgia, unspecified: Secondary | ICD-10-CM | POA: Diagnosis not present

## 2023-02-28 ENCOUNTER — Other Ambulatory Visit: Payer: Self-pay

## 2023-02-28 ENCOUNTER — Ambulatory Visit: Payer: Medicare Other | Attending: Orthopedic Surgery | Admitting: Physical Therapy

## 2023-02-28 DIAGNOSIS — M25552 Pain in left hip: Secondary | ICD-10-CM | POA: Diagnosis not present

## 2023-02-28 DIAGNOSIS — M6281 Muscle weakness (generalized): Secondary | ICD-10-CM | POA: Diagnosis not present

## 2023-02-28 NOTE — Therapy (Signed)
OUTPATIENT PHYSICAL THERAPY LOWER EXTREMITY EVALUATION   Patient Name: Teresa Holloway MRN: [id number] DOB:1940-06-08, 82 y.o., female Today's Date: 02/28/2023  END OF SESSION:  PT End of Session - 02/28/23 1318     Visit Number 1    Number of Visits 12    Date for PT Re-Evaluation 04/11/23    Authorization Type FOTO.    PT Start Time 1018    PT Stop Time 1047    PT Time Calculation (min) 29 min    Activity Tolerance Patient tolerated treatment well    Behavior During Therapy WFL for tasks assessed/performed             Past Medical History:  Diagnosis Date   Anxiety    Arthritis    Knees, hips   CAD (coronary artery disease)    Chronic back pain    Depression    GERD (gastroesophageal reflux disease)    Hyperlipidemia    Hypertension    MI (myocardial infarction) (HCC)    Osteopenia    Past Surgical History:  Procedure Laterality Date   BIOPSY  07/16/2021   Procedure: BIOPSY;  Surgeon: Lanelle Bal, DO;  [facility]: [facility];  Service: Endoscopy;;   COLONOSCOPY WITH PROPOFOL N/A 07/16/2021   Procedure: COLONOSCOPY WITH PROPOFOL;  Surgeon: Lanelle Bal, DO;  Location: [facility];  Service: Endoscopy;  Laterality: N/A;  10:30am   ECTOPIC PREGNANCY SURGERY     ESOPHAGOGASTRODUODENOSCOPY (EGD) WITH PROPOFOL N/A 07/16/2021   Procedure: ESOPHAGOGASTRODUODENOSCOPY (EGD) WITH PROPOFOL;  Surgeon: Lanelle Bal, DO;  [facility]: [facility];  Service: Endoscopy;  Laterality: N/A;   left breast biospy     left wrist fracture     TOTAL HIP ARTHROPLASTY Right 02/17/2023   Procedure: RIGHT TOTAL HIP ARTHROPLASTY ANTERIOR APPROACH;  Surgeon: Gean Birchwood, MD;  Location: [facility];  Service: Orthopedics;  Laterality: Right;   TOTAL KNEE ARTHROPLASTY Left 01/25/2019   Procedure: LEFT TOTAL KNEE ARTHROPLASTY;  Surgeon: Gean Birchwood, MD;  Location: [facility];  Service: Orthopedics;  Laterality: Left;   Patient Active Problem List   Diagnosis Date Noted   S/P total  right hip arthroplasty 02/17/2023   Osteoarthritis of right hip 02/14/2023   Hypokalemia 09/05/2022   Anemia 02/05/2022   Constipation 10/29/2021   Helicobacter pylori gastritis 10/29/2021   Status post total left knee replacement 01/25/2019   Pain management contract agreement 02/22/2016   Opioid type dependence, continuous (HCC) 02/22/2016   BMI 30.0-30.9,adult 01/02/2015   Osteoarthritis of left hip 08/23/2013   Depression 08/23/2013   Osteoporosis 06/23/2013   Glaucoma of right eye 06/23/2013   CAD (coronary artery disease) 08/19/2012   History of MI (myocardial infarction) 08/19/2012   Arthritis of both knees 08/19/2012   Essential hypertension, benign 08/10/2012   Hyperlipidemia with target LDL less than 100 08/10/2012   GERD (gastroesophageal reflux disease) 08/10/2012    REFERRING PROVIDER: Gean Birchwood MD  REFERRING DIAG: Rt anterior total hip arthroplasty  THERAPY DIAG:  Pain in left hip  Muscle weakness (generalized)  Rationale for Evaluation and Treatment: Rehabilitation  ONSET DATE: Surgery date (02/17/23).  SUBJECTIVE:   SUBJECTIVE STATEMENT: The patient presents to the clinic s/p right anterior total hip replacement performed on 02/17/23.  She reports a pain-level today 5-6/10 with pain in her right groin region.  She is using a FWW for safety.  She has been compliant to an HEP and states she is doing SLR's, hip abduction and LAQ's.  PERTINENT HISTORY: Left TKA. PAIN:  Are you having pain? Yes: NPRS scale: 5-6/10 Pain location: Right hip. Pain description: Ache. Aggravating factors: Increased activity. Relieving factors: Rest.  PRECAUTIONS: Anterior hip.  No ultrasound.   WEIGHT BEARING RESTRICTIONS: No  FALLS:  Has patient fallen in last 6 months? No  LIVING ENVIRONMENT: Lives in: House/apartment Stairs:  external. Use railing.  Non-reciprocating fashion. Has following equipment at home: Dan Humphreys - 2 wheeled   PLOF: Independent with basic  ADLs and Independent with household mobility with device  PATIENT GOALS: Move better without right hip pain.   OBJECTIVE:  Note: Objective measures were completed at Evaluation unless otherwise noted.  PATIENT SURVEYS:  FOTO 35.54.  OBSERVATION:  Right hip incisional site appears to be healing well.  POSTURE:  Equal leg lengths.  PALPATION: Mild tenderness on either side of incisional site.  LOWER EXTREMITY MMT:  Normal right ankle strength.  Patient easily able to perform a right SAQ and perform hip abduction in supine.  TRANSFERS:    Patient transferred from sit to supine and supine to sit with supervision only.   GAIT: The patient is walking with a FWW with essentially normal gait pattern.  PATIENT EDUCATION:  Education details: Discussed HEP that she received from hospital. Person educated: Patient Education method: Medical illustrator Education comprehension: verbalized understanding and returned demonstration  HOME EXERCISE PROGRAM:   ASSESSMENT:  CLINICAL IMPRESSION: The patient presents to [facility] s/p right anterior total hip replacement performed on 02/17/23.  Her CC is pain into her right groin region.  She is ambulating with a FWW.  Her right hip incisional site appears to be healing well.  She exhibits normal right ankle strength and she is easily able to perform a right SAQ and perform hip abduction in supine.  She has been compliant to her HEP that she received in the hospital.  Her FOTO limitation score is 35.54.  She is able to transfer from sit to supine and supine to sit with supervision only.  Patient will benefit from skilled physical therapy intervention to address pain and deficits.  OBJECTIVE IMPAIRMENTS: decreased activity tolerance and pain.   ACTIVITY LIMITATIONS: standing and stairs  PARTICIPATION LIMITATIONS: laundry and community activity  PERSONAL FACTORS: Time since onset of injury/illness/exacerbation are also affecting  patient's functional outcome.   REHAB POTENTIAL: Excellent  CLINICAL DECISION MAKING: Stable/uncomplicated  EVALUATION COMPLEXITY: Low   GOALS:  LONG TERM GOALS: Target date: 04/11/23  Ind with a HEP.  Goal status: INITIAL  2.  Patient walk a community distance with a pain-level of not > 3/10.  Goal status: INITIAL  3.  Perform a reciprocating stair gait with one railing.  Goal status: INITIAL  4.  Perform ADL's with pain not > 3/10.  Goal status: INITIAL  PLAN:  PT FREQUENCY: 2x/week  PT DURATION: 6 weeks  PLANNED INTERVENTIONS: 97110-Therapeutic exercises, 97530- Therapeutic activity, [id number]- Neuromuscular re-education, 97535- Self Care, [id number]- Manual therapy, [id number]- Gait training, 97014- Electrical stimulation (unattended), Patient/Family education, Stair training, Cryotherapy, and Moist heat  PLAN FOR NEXT SESSION: Nustep, Gait activities, LE therex.     Myran Arcia, [place], PT 02/28/2023, 2:21 PM

## 2023-03-04 ENCOUNTER — Ambulatory Visit: Payer: Medicare Other

## 2023-03-04 DIAGNOSIS — M6281 Muscle weakness (generalized): Secondary | ICD-10-CM | POA: Diagnosis not present

## 2023-03-04 DIAGNOSIS — M25552 Pain in left hip: Secondary | ICD-10-CM

## 2023-03-04 NOTE — Therapy (Signed)
OUTPATIENT PHYSICAL THERAPY LOWER EXTREMITY EVALUATION   Patient Name: Teresa Holloway MRN: [id number] DOB:17-Aug-1940, 82 y.o., female Today's Date: 03/04/2023  END OF SESSION:  PT End of Session - 03/04/23 1518     Visit Number 2    Number of Visits 12    Date for PT Re-Evaluation 04/11/23    Authorization Type FOTO.    PT Start Time 1515    PT Stop Time 1600    PT Time Calculation (min) 45 min    Activity Tolerance Patient tolerated treatment well    Behavior During Therapy WFL for tasks assessed/performed             Past Medical History:  Diagnosis Date   Anxiety    Arthritis    Knees, hips   CAD (coronary artery disease)    Chronic back pain    Depression    GERD (gastroesophageal reflux disease)    Hyperlipidemia    Hypertension    MI (myocardial infarction) (HCC)    Osteopenia    Past Surgical History:  Procedure Laterality Date   BIOPSY  07/16/2021   Procedure: BIOPSY;  Surgeon: Lanelle Bal, DO;  [facility]: [facility];  Service: Endoscopy;;   COLONOSCOPY WITH PROPOFOL N/A 07/16/2021   Procedure: COLONOSCOPY WITH PROPOFOL;  Surgeon: Lanelle Bal, DO;  [facility];  Service: Endoscopy;  Laterality: N/A;  10:30am   ECTOPIC PREGNANCY SURGERY     ESOPHAGOGASTRODUODENOSCOPY (EGD) WITH PROPOFOL N/A 07/16/2021   Procedure: ESOPHAGOGASTRODUODENOSCOPY (EGD) WITH PROPOFOL;  Surgeon: Lanelle Bal, DO;  [facility];  Service: Endoscopy;  Laterality: N/A;   left breast biospy     left wrist fracture     TOTAL HIP ARTHROPLASTY Right 02/17/2023   Procedure: RIGHT TOTAL HIP ARTHROPLASTY ANTERIOR APPROACH;  Surgeon: Gean Birchwood, MD;  Location: [facility];  Service: Orthopedics;  Laterality: Right;   TOTAL KNEE ARTHROPLASTY Left 01/25/2019   Procedure: LEFT TOTAL KNEE ARTHROPLASTY;  Surgeon: Gean Birchwood, MD;  Location: [facility];  Service: Orthopedics;  Laterality: Left;   Patient Active Problem List   Diagnosis Date Noted   S/P total  right hip arthroplasty 02/17/2023   Osteoarthritis of right hip 02/14/2023   Hypokalemia 09/05/2022   Anemia 02/05/2022   Constipation 10/29/2021   Helicobacter pylori gastritis 10/29/2021   Status post total left knee replacement 01/25/2019   Pain management contract agreement 02/22/2016   Opioid type dependence, continuous (HCC) 02/22/2016   BMI 30.0-30.9,adult 01/02/2015   Osteoarthritis of left hip 08/23/2013   Depression 08/23/2013   Osteoporosis 06/23/2013   Glaucoma of right eye 06/23/2013   CAD (coronary artery disease) 08/19/2012   History of MI (myocardial infarction) 08/19/2012   Arthritis of both knees 08/19/2012   Essential hypertension, benign 08/10/2012   Hyperlipidemia with target LDL less than 100 08/10/2012   GERD (gastroesophageal reflux disease) 08/10/2012    REFERRING PROVIDER: Gean Birchwood MD  REFERRING DIAG: Rt anterior total hip arthroplasty  THERAPY DIAG:  Pain in left hip  Muscle weakness (generalized)  Rationale for Evaluation and Treatment: Rehabilitation  ONSET DATE: Surgery date (02/17/23).  SUBJECTIVE:   SUBJECTIVE STATEMENT: Pt reports 8/10 right hip pain today.   PERTINENT HISTORY: Left TKA. PAIN:  Are you having pain? Yes: NPRS scale: 8/10 Pain location: Right hip. Pain description: Ache. Aggravating factors: Increased activity. Relieving factors: Rest.  PRECAUTIONS: Anterior hip.  No ultrasound.   WEIGHT BEARING RESTRICTIONS: No  FALLS:  Has patient fallen in last  6 months? No  LIVING ENVIRONMENT: Lives in: House/apartment Stairs:  external. Use railing.  Non-reciprocating fashion. Has following equipment at home: Dan Humphreys - 2 wheeled   PLOF: Independent with basic ADLs and Independent with household mobility with device  PATIENT GOALS: Move better without right hip pain.   OBJECTIVE:  Note: Objective measures were completed at Evaluation unless otherwise noted.  PATIENT SURVEYS:  FOTO  35.54.  OBSERVATION:  Right hip incisional site appears to be healing well.  POSTURE:  Equal leg lengths.  PALPATION: Mild tenderness on either side of incisional site.  LOWER EXTREMITY MMT:  Normal right ankle strength.  Patient easily able to perform a right SAQ and perform hip abduction in supine.  TRANSFERS:    Patient transferred from sit to supine and supine to sit with supervision only.   GAIT: The patient is walking with a FWW with essentially normal gait pattern.  TODAY'S TREATMENT:                                     EXERCISE LOG  Exercise Repetitions and Resistance Comments  Nustep Lvl 2 x 15 mins   Rockerboard 4 mins   LAQs 2# x 20 reps   Seated marches 2# x 20 reps   Seated Hip Abduction Red x 3 mins   Seated Hip Adduction 3 mins   Seated Ham Curls Red x 20 reps   STS     Blank cell = exercise not performed today   PATIENT EDUCATION:  Education details: Discussed HEP that she received from hospital. Person educated: Patient Education method: Medical illustrator Education comprehension: verbalized understanding and returned demonstration  HOME EXERCISE PROGRAM:   ASSESSMENT:  CLINICAL IMPRESSION: Pt arrives for today's treatment session reporting 8/10 right hip pain.  Pt introduced to Nustep for warm up today without issue or complaint of pain.  Pt introduced to standing and seated exercises today to increase strength, function, and ROM.  Pt requiring min cues for proper technique with all newly added exercises.  Pt reported decreased pain at completion of today's treatment session.  OBJECTIVE IMPAIRMENTS: decreased activity tolerance and pain.   ACTIVITY LIMITATIONS: standing and stairs  PARTICIPATION LIMITATIONS: laundry and community activity  PERSONAL FACTORS: Time since onset of injury/illness/exacerbation are also affecting patient's functional outcome.   REHAB POTENTIAL: Excellent  CLINICAL DECISION MAKING:  Stable/uncomplicated  EVALUATION COMPLEXITY: Low   GOALS:  LONG TERM GOALS: Target date: 04/11/23  Ind with a HEP.  Goal status: INITIAL  2.  Patient walk a community distance with a pain-level of not > 3/10.  Goal status: INITIAL  3.  Perform a reciprocating stair gait with one railing.  Goal status: INITIAL  4.  Perform ADL's with pain not > 3/10.  Goal status: INITIAL  PLAN:  PT FREQUENCY: 2x/week  PT DURATION: 6 weeks  PLANNED INTERVENTIONS: 97110-Therapeutic exercises, 97530- Therapeutic activity, [id number]- Neuromuscular re-education, 97535- Self Care, 32951- Manual therapy, [phone]- Gait training, 97014- Electrical stimulation (unattended), Patient/Family education, Stair training, Cryotherapy, and Moist heat  PLAN FOR NEXT SESSION: Nustep, Gait activities, LE therex.     Newman Pies, PTA 03/04/2023, 4:10 PM

## 2023-03-06 ENCOUNTER — Encounter: Payer: Self-pay | Admitting: *Deleted

## 2023-03-06 ENCOUNTER — Ambulatory Visit: Payer: Medicare Other | Admitting: *Deleted

## 2023-03-06 DIAGNOSIS — M6281 Muscle weakness (generalized): Secondary | ICD-10-CM | POA: Diagnosis not present

## 2023-03-06 DIAGNOSIS — M25552 Pain in left hip: Secondary | ICD-10-CM | POA: Diagnosis not present

## 2023-03-06 NOTE — Therapy (Signed)
OUTPATIENT PHYSICAL THERAPY LOWER EXTREMITY TREATMENT   Patient Name: Teresa Holloway MRN: [id number] DOB:[birth year], 82 y.o., female Today's Date: 03/06/2023  END OF SESSION:  PT End of Session - 03/06/23 1258     Visit Number 3    Number of Visits 12    Date for PT Re-Evaluation 04/11/23    Authorization Type FOTO.    PT Start Time 1258    PT Stop Time 1348    PT Time Calculation (min) 50 min             Past Medical History:  Diagnosis Date   Anxiety    Arthritis    Knees, hips   CAD (coronary artery disease)    Chronic back pain    Depression    GERD (gastroesophageal reflux disease)    Hyperlipidemia    Hypertension    MI (myocardial infarction) (HCC)    Osteopenia    Past Surgical History:  Procedure Laterality Date   BIOPSY  07/16/2021   Procedure: BIOPSY;  Surgeon: Lanelle Bal, DO;  Location: [facility];  Service: Endoscopy;;   COLONOSCOPY WITH PROPOFOL N/A 07/16/2021   Procedure: COLONOSCOPY WITH PROPOFOL;  Surgeon: Lanelle Bal, DO;  Location: [facility];  Service: Endoscopy;  Laterality: N/A;  10:30am   ECTOPIC PREGNANCY SURGERY     ESOPHAGOGASTRODUODENOSCOPY (EGD) WITH PROPOFOL N/A 07/16/2021   Procedure: ESOPHAGOGASTRODUODENOSCOPY (EGD) WITH PROPOFOL;  Surgeon: Lanelle Bal, DO;  Location: [facility];  Service: Endoscopy;  Laterality: N/A;   left breast biospy     left wrist fracture     TOTAL HIP ARTHROPLASTY Right 02/17/2023   Procedure: RIGHT TOTAL HIP ARTHROPLASTY ANTERIOR APPROACH;  Surgeon: Gean Birchwood, MD;  Location: [facility];  Service: Orthopedics;  Laterality: Right;   TOTAL KNEE ARTHROPLASTY Left 01/25/2019   Procedure: LEFT TOTAL KNEE ARTHROPLASTY;  Surgeon: Gean Birchwood, MD;  Location: [facility];  Service: Orthopedics;  Laterality: Left;   Patient Active Problem List   Diagnosis Date Noted   S/P total right hip arthroplasty 02/17/2023   Osteoarthritis of right hip 02/14/2023   Hypokalemia 09/05/2022   Anemia  02/05/2022   Constipation 10/29/2021   Helicobacter pylori gastritis 10/29/2021   Status post total left knee replacement 01/25/2019   Pain management contract agreement 02/22/2016   Opioid type dependence, continuous (HCC) 02/22/2016   BMI 30.0-30.9,adult 01/02/2015   Osteoarthritis of left hip 08/23/2013   Depression 08/23/2013   Osteoporosis 06/23/2013   Glaucoma of right eye 06/23/2013   CAD (coronary artery disease) 08/19/2012   History of MI (myocardial infarction) 08/19/2012   Arthritis of both knees 08/19/2012   Essential hypertension, benign 08/10/2012   Hyperlipidemia with target LDL less than 100 08/10/2012   GERD (gastroesophageal reflux disease) 08/10/2012    REFERRING PROVIDER: Gean Birchwood MD  REFERRING DIAG: Rt anterior total hip arthroplasty  THERAPY DIAG:  Pain in left hip  Muscle weakness (generalized)  Rationale for Evaluation and Treatment: Rehabilitation  ONSET DATE: Surgery date (02/17/23).  SUBJECTIVE:   SUBJECTIVE STATEMENT: Pt reports 5/10 right hip pain today.   PERTINENT HISTORY: Left TKA. PAIN:  Are you having pain? Yes: NPRS scale: 5/10 Pain location: Right hip. Pain description: Ache. Aggravating factors: Increased activity. Relieving factors: Rest.  PRECAUTIONS: Anterior hip.  No ultrasound.   WEIGHT BEARING RESTRICTIONS: No  FALLS:  Has patient fallen in last 6 months? No  LIVING ENVIRONMENT: Lives in: House/apartment Stairs:  external. Use railing.  Non-reciprocating fashion. Has following  equipment at home: Dan Humphreys - 2 wheeled   PLOF: Independent with basic ADLs and Independent with household mobility with device  PATIENT GOALS: Move better without right hip pain.   OBJECTIVE:  Note: Objective measures were completed at Evaluation unless otherwise noted.  PATIENT SURVEYS:  FOTO 35.54.  OBSERVATION:  Right hip incisional site appears to be healing well.  POSTURE:  Equal leg lengths.  PALPATION: Mild  tenderness on either side of incisional site.  LOWER EXTREMITY MMT:  Normal right ankle strength.  Patient easily able to perform a right SAQ and perform hip abduction in supine.  TRANSFERS:    Patient transferred from sit to supine and supine to sit with supervision only.   GAIT: The patient is walking with a FWW with essentially normal gait pattern.  TODAY'S TREATMENT:                                     EXERCISE LOG    RT hip  Exercise Repetitions and Resistance Comments  Nustep Lvl 3 x 15 mins   Rockerboard 4 mins   6in box toe taps 3x10   Side stepping In // bars 5 laps  UE assist   Walking forward/ back // bars X 5 laps   with UE   LAQs 2# x 20 reps pause at top   Seated marches 2# x 20 reps   Seated Hip Abduction Red x 3 mins   Seated Hip Adduction    Seated Ham Curls `   STS     Blank cell = exercise not performed today   PATIENT EDUCATION:  Education details: Discussed HEP that she received from hospital. Person educated: Patient Education method: Medical illustrator Education comprehension: verbalized understanding and returned demonstration  HOME EXERCISE PROGRAM:   ASSESSMENT:  CLINICAL IMPRESSION: Pt arrives today with soreness RT hip, but doing okay. Rx focused on RT LE strengthening in standing and seated positions as well as walking forward and backwards in // bars. Pt felt good after Rx with just fatigue.     OBJECTIVE IMPAIRMENTS: decreased activity tolerance and pain.   ACTIVITY LIMITATIONS: standing and stairs  PARTICIPATION LIMITATIONS: laundry and community activity  PERSONAL FACTORS: Time since onset of injury/illness/exacerbation are also affecting patient's functional outcome.   REHAB POTENTIAL: Excellent  CLINICAL DECISION MAKING: Stable/uncomplicated  EVALUATION COMPLEXITY: Low   GOALS:  LONG TERM GOALS: Target date: 04/11/23  Ind with a HEP.  Goal status: INITIAL  2.  Patient walk a community distance with a  pain-level of not > 3/10.  Goal status: INITIAL  3.  Perform a reciprocating stair gait with one railing.  Goal status: INITIAL  4.  Perform ADL's with pain not > 3/10.  Goal status: INITIAL  PLAN:  PT FREQUENCY: 2x/week  PT DURATION: 6 weeks  PLANNED INTERVENTIONS: 97110-Therapeutic exercises, 97530- Therapeutic activity, [id number]- Neuromuscular re-education, 97535- Self Care, [id number]- Manual therapy, [phone]- Gait training, 97014- Electrical stimulation (unattended), Patient/Family education, Stair training, Cryotherapy, and Moist heat  PLAN FOR NEXT SESSION: Nustep, Gait activities, LE therex.     Camp Gopal,CHRIS, PTA 03/06/2023, 2:47 PM

## 2023-03-10 ENCOUNTER — Ambulatory Visit: Payer: Medicare Other

## 2023-03-10 DIAGNOSIS — M6281 Muscle weakness (generalized): Secondary | ICD-10-CM | POA: Diagnosis not present

## 2023-03-10 DIAGNOSIS — M25552 Pain in left hip: Secondary | ICD-10-CM | POA: Diagnosis not present

## 2023-03-10 NOTE — Therapy (Signed)
OUTPATIENT PHYSICAL THERAPY LOWER EXTREMITY TREATMENT   Patient Name: Teresa Holloway MRN: [id number] DOB:[birth year], 82 y.o., female Today's Date: 03/10/2023  END OF SESSION:  PT End of Session - 03/10/23 1433     Visit Number 4    Number of Visits 12    Date for PT Re-Evaluation 04/11/23    Authorization Type FOTO.    PT Start Time 1430    PT Stop Time 1513    PT Time Calculation (min) 43 min    Activity Tolerance Patient tolerated treatment well    Behavior During Therapy WFL for tasks assessed/performed              Past Medical History:  Diagnosis Date   Anxiety    Arthritis    Knees, hips   CAD (coronary artery disease)    Chronic back pain    Depression    GERD (gastroesophageal reflux disease)    Hyperlipidemia    Hypertension    MI (myocardial infarction) (HCC)    Osteopenia    Past Surgical History:  Procedure Laterality Date   BIOPSY  07/16/2021   Procedure: BIOPSY;  Surgeon: Lanelle Bal, DO;  [facility]: [facility];  Service: Endoscopy;;   COLONOSCOPY WITH PROPOFOL N/A 07/16/2021   Procedure: COLONOSCOPY WITH PROPOFOL;  Surgeon: Lanelle Bal, DO;  [facility]: [facility];  Service: Endoscopy;  Laterality: N/A;  10:30am   ECTOPIC PREGNANCY SURGERY     ESOPHAGOGASTRODUODENOSCOPY (EGD) WITH PROPOFOL N/A 07/16/2021   Procedure: ESOPHAGOGASTRODUODENOSCOPY (EGD) WITH PROPOFOL;  Surgeon: Lanelle Bal, DO;  [facility];  Service: Endoscopy;  Laterality: N/A;   left breast biospy     left wrist fracture     TOTAL HIP ARTHROPLASTY Right 02/17/2023   Procedure: RIGHT TOTAL HIP ARTHROPLASTY ANTERIOR APPROACH;  Surgeon: Gean Birchwood, MD;  Location: [facility];  Service: Orthopedics;  Laterality: Right;   TOTAL KNEE ARTHROPLASTY Left 01/25/2019   Procedure: LEFT TOTAL KNEE ARTHROPLASTY;  Surgeon: Gean Birchwood, MD;  Location: [facility];  Service: Orthopedics;  Laterality: Left;   Patient Active Problem List   Diagnosis Date Noted   S/P  total right hip arthroplasty 02/17/2023   Osteoarthritis of right hip 02/14/2023   Hypokalemia 09/05/2022   Anemia 02/05/2022   Constipation 10/29/2021   Helicobacter pylori gastritis 10/29/2021   Status post total left knee replacement 01/25/2019   Pain management contract agreement 02/22/2016   Opioid type dependence, continuous (HCC) 02/22/2016   BMI 30.0-30.9,adult 01/02/2015   Osteoarthritis of left hip 08/23/2013   Depression 08/23/2013   Osteoporosis 06/23/2013   Glaucoma of right eye 06/23/2013   CAD (coronary artery disease) 08/19/2012   History of MI (myocardial infarction) 08/19/2012   Arthritis of both knees 08/19/2012   Essential hypertension, benign 08/10/2012   Hyperlipidemia with target LDL less than 100 08/10/2012   GERD (gastroesophageal reflux disease) 08/10/2012    REFERRING PROVIDER: Gean Birchwood MD  REFERRING DIAG: Rt anterior total hip arthroplasty  THERAPY DIAG:  Pain in left hip  Muscle weakness (generalized)  Rationale for Evaluation and Treatment: Rehabilitation  ONSET DATE: Surgery date (02/17/23).  SUBJECTIVE:   SUBJECTIVE STATEMENT: Patient reports that she is hurting a little today. However, she felt alright after her last appointment.   PERTINENT HISTORY: Left TKA. PAIN:  Are you having pain? Yes: NPRS scale: 3/10 Pain location: Right hip. Pain description: Ache. Aggravating factors: Increased activity. Relieving factors: Rest.  PRECAUTIONS: Anterior hip.  No ultrasound.   WEIGHT  BEARING RESTRICTIONS: No  FALLS:  Has patient fallen in last 6 months? No  LIVING ENVIRONMENT: Lives in: House/apartment Stairs:  external. Use railing.  Non-reciprocating fashion. Has following equipment at home: Dan Humphreys - 2 wheeled   PLOF: Independent with basic ADLs and Independent with household mobility with device  PATIENT GOALS: Move better without right hip pain.   OBJECTIVE:  Note: Objective measures were completed at Evaluation  unless otherwise noted.  PATIENT SURVEYS:  FOTO 35.54.  OBSERVATION:  Right hip incisional site appears to be healing well.  POSTURE:  Equal leg lengths.  PALPATION: Mild tenderness on either side of incisional site.  LOWER EXTREMITY MMT:  Normal right ankle strength.  Patient easily able to perform a right SAQ and perform hip abduction in supine.  TRANSFERS:    Patient transferred from sit to supine and supine to sit with supervision only.   GAIT: The patient is walking with a FWW with essentially normal gait pattern.  TODAY'S TREATMENT:                                   03/10/23 EXERCISE LOG  Exercise Repetitions and Resistance Comments  Nustep  L3 x 17 minutes   Rocker board  4 minutes BUE support   Marching on foam  2 minutes  BUE support   LAQ 3# x 3 minutes  Alternating LE  Seated HS curl  Red t-band x 20 reps each    Sit to stand  20 reps  From elevated mat table  Seated hip ADD isometric  3.5 minutes w/ 5 second hold     Blank cell = exercise not performed today                                    03/06/23 EXERCISE LOG    RT hip  Exercise Repetitions and Resistance Comments  Nustep Lvl 3 x 15 mins   Rockerboard 4 mins   6in box toe taps 3x10   Side stepping In // bars 5 laps  UE assist   Walking forward/ back // bars X 5 laps   with UE   LAQs 2# x 20 reps pause at top   Seated marches 2# x 20 reps   Seated Hip Abduction Red x 3 mins   Seated Hip Adduction    Seated Ham Curls `   STS     Blank cell = exercise not performed today   PATIENT EDUCATION:  Education details: Discussed HEP that she received from hospital. Person educated: Patient Education method: Medical illustrator Education comprehension: verbalized understanding and returned demonstration  HOME EXERCISE PROGRAM:   ASSESSMENT:  CLINICAL IMPRESSION: Patient was progressed with multiple new standing and seated interventions for improved lower extremity strength and  stability with moderate difficulty and fatigue. She required minimal cueing with sit to stands to avoid using her upper extremities for assistance to facilitate increased lower extremity demand. She experienced no increase in pain or discomfort with any of today's interventions. She reported feeling a muscle burn upon the conclusion of treatment. She continues to require skilled physical therapy to address her remaining impairments to return to her prior level of function.   OBJECTIVE IMPAIRMENTS: decreased activity tolerance and pain.   ACTIVITY LIMITATIONS: standing and stairs  PARTICIPATION LIMITATIONS: laundry and community activity  PERSONAL FACTORS: Time since  onset of injury/illness/exacerbation are also affecting patient's functional outcome.   REHAB POTENTIAL: Excellent  CLINICAL DECISION MAKING: Stable/uncomplicated  EVALUATION COMPLEXITY: Low   GOALS:  LONG TERM GOALS: Target date: 04/11/23  Ind with a HEP.  Goal status: INITIAL  2.  Patient walk a community distance with a pain-level of not > 3/10.  Goal status: INITIAL  3.  Perform a reciprocating stair gait with one railing.  Goal status: INITIAL  4.  Perform ADL's with pain not > 3/10.  Goal status: INITIAL  PLAN:  PT FREQUENCY: 2x/week  PT DURATION: 6 weeks  PLANNED INTERVENTIONS: 97110-Therapeutic exercises, 97530- Therapeutic activity, [id number]- Neuromuscular re-education, 97535- Self Care, 16109- Manual therapy, [phone]- Gait training, 97014- Electrical stimulation (unattended), Patient/Family education, Stair training, Cryotherapy, and Moist heat  PLAN FOR NEXT SESSION: Nustep, Gait activities, LE therex.     Granville Lewis, PT 03/10/2023, 3:15 PM

## 2023-03-17 ENCOUNTER — Ambulatory Visit (INDEPENDENT_AMBULATORY_CARE_PROVIDER_SITE_OTHER): Payer: Medicare Other | Admitting: Nurse Practitioner

## 2023-03-17 ENCOUNTER — Encounter: Payer: Self-pay | Admitting: Nurse Practitioner

## 2023-03-17 VITALS — BP 149/68 | HR 60 | Temp 97.8°F | Resp 20 | Ht 65.0 in | Wt 139.0 lb

## 2023-03-17 DIAGNOSIS — M170 Bilateral primary osteoarthritis of knee: Secondary | ICD-10-CM | POA: Diagnosis not present

## 2023-03-17 DIAGNOSIS — M1611 Unilateral primary osteoarthritis, right hip: Secondary | ICD-10-CM

## 2023-03-17 DIAGNOSIS — M1612 Unilateral primary osteoarthritis, left hip: Secondary | ICD-10-CM | POA: Diagnosis not present

## 2023-03-17 MED ORDER — TRAMADOL HCL 50 MG PO TABS: 50.0000 mg | ORAL_TABLET | Freq: Two times a day (BID) | ORAL | 0 refills | Status: AC

## 2023-03-17 MED ORDER — TRAMADOL HCL 50 MG PO TABS: 50.0000 mg | ORAL_TABLET | Freq: Two times a day (BID) | ORAL | 0 refills | Status: DC

## 2023-03-17 NOTE — Progress Notes (Signed)
Subjective:    Patient ID: Teresa Holloway, female    DOB: 14-Dec-1940, 82 y.o.   MRN: [id number]   Chief Complaint: Pain Management   HPI  Pain assessment: Cause of pain- osteoarthritis Pain location- knees and hip- recently had a total hip replacement Pain on scale of 1-10- 7-8/10 Frequency- daily What increases pain-to much standing and walking What makes pain Better-rest helps Effects on ADL - none Any change in general medical condition-none  Current opioids rx- ultram 50mg  bid # meds rx- 60 Effectiveness of current meds-helps Adverse reactions from pain meds-none Morphine equivalent-  Pill count performed-No Last drug screen - 12/16/22 ( high risk q2m, moderate risk q43m, low risk yearly ) Urine drug screen today- No Was the [facility] reviewed- yes  If yes were their any concerning findings? - no  Was on a short course of dilaudid after she had her total hip replacement. Overdose risk: 1    06/06/2022   10:33 AM  Opioid Risk   Alcohol 0  Illegal Drugs 0  Rx Drugs 0  Alcohol 0  Illegal Drugs 0  Rx Drugs 0  Age between 16-45 years  0  History of Preadolescent Sexual Abuse 0  Psychological Disease 0  Depression 0  Opioid Risk Tool Scoring 0  Opioid Risk Interpretation Low Risk     Pain contract signed on: 12/17/22   Patient Active Problem List   Diagnosis Date Noted   S/P total right hip arthroplasty 02/17/2023   Osteoarthritis of right hip 02/14/2023   Hypokalemia 09/05/2022   Anemia 02/05/2022   Constipation 10/29/2021   Helicobacter pylori gastritis 10/29/2021   Status post total left knee replacement 01/25/2019   Pain management contract agreement 02/22/2016   Opioid type dependence, continuous (HCC) 02/22/2016   BMI 30.0-30.9,adult 01/02/2015   Osteoarthritis of left hip 08/23/2013   Depression 08/23/2013   Osteoporosis 06/23/2013   Glaucoma of right eye 06/23/2013   CAD (coronary artery disease) 08/19/2012   History of MI (myocardial  infarction) 08/19/2012   Arthritis of both knees 08/19/2012   Essential hypertension, benign 08/10/2012   Hyperlipidemia with target LDL less than 100 08/10/2012   GERD (gastroesophageal reflux disease) 08/10/2012       Review of Systems  Constitutional:  Negative for diaphoresis.  Eyes:  Negative for pain.  Respiratory:  Negative for shortness of breath.   Cardiovascular:  Negative for chest pain, palpitations and leg swelling.  Gastrointestinal:  Negative for abdominal pain.  Endocrine: Negative for polydipsia.  Skin:  Negative for rash.  Neurological:  Negative for dizziness, weakness and headaches.  Hematological:  Does not bruise/bleed easily.  All other systems reviewed and are negative.      Objective:   Physical Exam Vitals and nursing note reviewed.  Constitutional:      General: She is not in acute distress.    Appearance: Normal appearance. She is well-developed.  Neck:     Vascular: No carotid bruit or JVD.  Cardiovascular:     Rate and Rhythm: Normal rate and regular rhythm.     Heart sounds: Normal heart sounds.  Pulmonary:     Effort: Pulmonary effort is normal. No respiratory distress.     Breath sounds: Normal breath sounds. No wheezing or rales.  Chest:     Chest wall: No tenderness.  Abdominal:     General: Bowel sounds are normal. There is no distension or abdominal bruit.     Palpations: Abdomen is soft. There is no  hepatomegaly, splenomegaly, mass or pulsatile mass.     Tenderness: There is no abdominal tenderness.  Musculoskeletal:        General: Normal range of motion.     Cervical back: Normal range of motion and neck supple.     Comments: Walking with cane  Lymphadenopathy:     Cervical: No cervical adenopathy.  Skin:    General: Skin is warm and dry.  Neurological:     Mental Status: She is alert and oriented to person, place, and time.     Deep Tendon Reflexes: Reflexes are normal and symmetric.  Psychiatric:        Behavior:  Behavior normal.        Thought Content: Thought content normal.        Judgment: Judgment normal.     BP (!) 149/68   Pulse 60   Temp 97.8 F (36.6 C) (Temporal)   Resp 20   Ht 5\' 5"  (1.651 m)   Wt 139 lb (63 kg)   SpO2 94%   BMI 23.13 kg/m        Assessment & Plan:   Teresa Holloway in today with chief complaint of Pain Management   1. Arthritis of both knees 2. Primary osteoarthritis of left hip 3. Primary osteoarthritis of right hip Meds ordered this encounter  Medications   traMADol (ULTRAM) 50 MG tablet    Sig: Take 1 tablet (50 mg total) by mouth 2 (two) times daily.    Dispense:  60 tablet    Refill:  0    Order Specific Question:   Supervising Provider    Answer:   Arville Care A [[id number]]   traMADol (ULTRAM) 50 MG tablet    Sig: Take 1 tablet (50 mg total) by mouth 2 (two) times daily.    Dispense:  60 tablet    Refill:  0    Order Specific Question:   Supervising Provider    Answer:   Arville Care A [[id number]]   traMADol (ULTRAM) 50 MG tablet    Sig: Take 1 tablet (50 mg total) by mouth 2 (two) times daily.    Dispense:  60 tablet    Refill:  0    Order Specific Question:   Supervising Provider    Answer:   Arville Care A [[id number]]       The above assessment and management plan was discussed with the patient. The patient verbalized understanding of and has agreed to the management plan. Patient is aware to call the clinic if symptoms persist or worsen. Patient is aware when to return to the clinic for a follow-up visit. Patient educated on when it is appropriate to go to the emergency department.   Mary-Margaret Daphine Deutscher, FNP

## 2023-03-17 NOTE — Patient Instructions (Signed)
Fall Prevention in the Home, Adult Falls can cause injuries and can happen to people of all ages. There are many things you can do to make your home safer and to help prevent falls. What actions can I take to prevent falls? General information Use good lighting in all rooms. Make sure to: Replace any light bulbs that burn out. Turn on the lights in dark areas and use night-lights. Keep items that you use often in easy-to-reach places. Lower the shelves around your home if needed. Move furniture so that there are clear paths around it. Do not use throw rugs or other things on the floor that can make you trip. If any of your floors are uneven, fix them. Add color or contrast paint or tape to clearly mark and help you see: Grab bars or handrails. First and last steps of staircases. Where the edge of each step is. If you use a ladder or stepladder: Make sure that it is fully opened. Do not climb a closed ladder. Make sure the sides of the ladder are locked in place. Have someone hold the ladder while you use it. Know where your pets are as you move through your home. What can I do in the bathroom?     Keep the floor dry. Clean up any water on the floor right away. Remove soap buildup in the bathtub or shower. Buildup makes bathtubs and showers slippery. Use non-skid mats or decals on the floor of the bathtub or shower. Attach bath mats securely with double-sided, non-slip rug tape. If you need to sit down in the shower, use a non-slip stool. Install grab bars by the toilet and in the bathtub and shower. Do not use towel bars as grab bars. What can I do in the bedroom? Make sure that you have a light by your bed that is easy to reach. Do not use any sheets or blankets on your bed that hang to the floor. Have a firm chair or bench with side arms that you can use for support when you get dressed. What can I do in the kitchen? Clean up any spills right away. If you need to reach something  above you, use a step stool with a grab bar. Keep electrical cords out of the way. Do not use floor polish or wax that makes floors slippery. What can I do with my stairs? Do not leave anything on the stairs. Make sure that you have a light switch at the top and the bottom of the stairs. Make sure that there are handrails on both sides of the stairs. Fix handrails that are broken or loose. Install non-slip stair treads on all your stairs if they do not have carpet. Avoid having throw rugs at the top or bottom of the stairs. Choose a carpet that does not hide the edge of the steps on the stairs. Make sure that the carpet is firmly attached to the stairs. Fix carpet that is loose or worn. What can I do on the outside of my home? Use bright outdoor lighting. Fix the edges of walkways and driveways and fix any cracks. Clear paths of anything that can make you trip, such as tools or rocks. Add color or contrast paint or tape to clearly mark and help you see anything that might make you trip as you walk through a door, such as a raised step or threshold. Trim any bushes or trees on paths to your home. Check to see if handrails are loose  or broken and that both sides of all steps have handrails. Install guardrails along the edges of any raised decks and porches. Have leaves, snow, or ice cleared regularly. Use sand, salt, or ice melter on paths if you live where there is ice and snow during the winter. Clean up any spills in your garage right away. This includes grease or oil spills. What other actions can I take? Review your medicines with your doctor. Some medicines can cause dizziness or changes in blood pressure, which increase your risk of falling. Wear shoes that: Have a low heel. Do not wear high heels. Have rubber bottoms and are closed at the toe. Feel good on your feet and fit well. Use tools that help you move around if needed. These include: Canes. Walkers. Scooters. Crutches. Ask  your doctor what else you can do to help prevent falls. This may include seeing a physical therapist to learn to do exercises to move better and get stronger. Where to find more information Centers for Disease Control and Prevention, [employer]: TonerPromos.no [employer] on Aging: BaseRingTones.pl National Institute on Aging: BaseRingTones.pl Contact a doctor if: You are afraid of falling at home. You feel weak, drowsy, or dizzy at home. You fall at home. Get help right away if you: Lose consciousness or have trouble moving after a fall. Have a fall that causes a head injury. These symptoms may be an emergency. Get help right away. Call 911. Do not wait to see if the symptoms will go away. Do not drive yourself to the hospital. This information is not intended to replace advice given to you by your health care provider. Make sure you discuss any questions you have with your health care provider. Document Revised: 12/10/2021 Document Reviewed: 12/10/2021 Elsevier Patient Education  2024 [employer].

## 2023-03-19 ENCOUNTER — Encounter: Payer: Medicare Other | Admitting: Physical Therapy

## 2023-03-24 DIAGNOSIS — E7800 Pure hypercholesterolemia, unspecified: Secondary | ICD-10-CM | POA: Diagnosis not present

## 2023-03-24 DIAGNOSIS — I2510 Atherosclerotic heart disease of native coronary artery without angina pectoris: Secondary | ICD-10-CM | POA: Diagnosis not present

## 2023-03-24 DIAGNOSIS — I10 Essential (primary) hypertension: Secondary | ICD-10-CM | POA: Diagnosis not present

## 2023-03-24 DIAGNOSIS — Z7982 Long term (current) use of aspirin: Secondary | ICD-10-CM | POA: Diagnosis not present

## 2023-03-24 DIAGNOSIS — R6889 Other general symptoms and signs: Secondary | ICD-10-CM | POA: Diagnosis not present

## 2023-03-24 DIAGNOSIS — I252 Old myocardial infarction: Secondary | ICD-10-CM | POA: Diagnosis not present

## 2023-03-25 ENCOUNTER — Ambulatory Visit: Payer: Medicare Other | Attending: Orthopedic Surgery

## 2023-03-25 DIAGNOSIS — M25552 Pain in left hip: Secondary | ICD-10-CM | POA: Insufficient documentation

## 2023-03-25 DIAGNOSIS — M6281 Muscle weakness (generalized): Secondary | ICD-10-CM | POA: Diagnosis not present

## 2023-03-25 NOTE — Therapy (Signed)
OUTPATIENT PHYSICAL THERAPY LOWER EXTREMITY TREATMENT   Patient Name: Teresa Holloway MRN: [id number] DOB:October 21, 1940, 82 y.o., female Today's Date: 03/25/2023  END OF SESSION:  PT End of Session - 03/25/23 1517     Visit Number 5    Number of Visits 12    Date for PT Re-Evaluation 04/11/23    Authorization Type FOTO.    PT Start Time 1515    PT Stop Time 1600    PT Time Calculation (min) 45 min    Activity Tolerance Patient tolerated treatment well    Behavior During Therapy WFL for tasks assessed/performed              Past Medical History:  Diagnosis Date   Anxiety    Arthritis    Knees, hips   CAD (coronary artery disease)    Chronic back pain    Depression    GERD (gastroesophageal reflux disease)    Hyperlipidemia    Hypertension    MI (myocardial infarction) (HCC)    Osteopenia    Past Surgical History:  Procedure Laterality Date   BIOPSY  07/16/2021   Procedure: BIOPSY;  Surgeon: Lanelle Bal, DO;  Location: [facility];  Service: Endoscopy;;   COLONOSCOPY WITH PROPOFOL N/A 07/16/2021   Procedure: COLONOSCOPY WITH PROPOFOL;  Surgeon: Lanelle Bal, DO;  Location: [facility];  Service: Endoscopy;  Laterality: N/A;  10:30am   ECTOPIC PREGNANCY SURGERY     ESOPHAGOGASTRODUODENOSCOPY (EGD) WITH PROPOFOL N/A 07/16/2021   Procedure: ESOPHAGOGASTRODUODENOSCOPY (EGD) WITH PROPOFOL;  Surgeon: Lanelle Bal, DO;  [facility]: [facility];  Service: Endoscopy;  Laterality: N/A;   left breast biospy     left wrist fracture     TOTAL HIP ARTHROPLASTY Right 02/17/2023   Procedure: RIGHT TOTAL HIP ARTHROPLASTY ANTERIOR APPROACH;  Surgeon: Gean Birchwood, MD;  Location: [facility];  Service: Orthopedics;  Laterality: Right;   TOTAL KNEE ARTHROPLASTY Left 01/25/2019   Procedure: LEFT TOTAL KNEE ARTHROPLASTY;  Surgeon: Gean Birchwood, MD;  Location: [facility];  Service: Orthopedics;  Laterality: Left;   Patient Active Problem List   Diagnosis Date Noted   S/P total  right hip arthroplasty 02/17/2023   Osteoarthritis of right hip 02/14/2023   Hypokalemia 09/05/2022   Anemia 02/05/2022   Constipation 10/29/2021   Helicobacter pylori gastritis 10/29/2021   Status post total left knee replacement 01/25/2019   Pain management contract agreement 02/22/2016   Opioid type dependence, continuous (HCC) 02/22/2016   BMI 30.0-30.9,adult 01/02/2015   Osteoarthritis of left hip 08/23/2013   Depression 08/23/2013   Osteoporosis 06/23/2013   Glaucoma of right eye 06/23/2013   CAD (coronary artery disease) 08/19/2012   History of MI (myocardial infarction) 08/19/2012   Arthritis of both knees 08/19/2012   Essential hypertension, benign 08/10/2012   Hyperlipidemia with target LDL less than 100 08/10/2012   GERD (gastroesophageal reflux disease) 08/10/2012    REFERRING PROVIDER: Gean Birchwood MD  REFERRING DIAG: Rt anterior total hip arthroplasty  THERAPY DIAG:  Pain in left hip  Muscle weakness (generalized)  Rationale for Evaluation and Treatment: Rehabilitation  ONSET DATE: Surgery date (02/17/23).  SUBJECTIVE:   SUBJECTIVE STATEMENT: Patient reports minimal left hip pain today.   PERTINENT HISTORY: Left TKA. PAIN:  Are you having pain? Yes: NPRS scale: 3/10 Pain location: Right hip. Pain description: Ache. Aggravating factors: Increased activity. Relieving factors: Rest.  PRECAUTIONS: Anterior hip.  No ultrasound.   WEIGHT BEARING RESTRICTIONS: No  FALLS:  Has patient fallen in  last 6 months? No  LIVING ENVIRONMENT: Lives in: House/apartment Stairs:  external. Use railing.  Non-reciprocating fashion. Has following equipment at home: Dan Humphreys - 2 wheeled   PLOF: Independent with basic ADLs and Independent with household mobility with device  PATIENT GOALS: Move better without right hip pain.   OBJECTIVE:  Note: Objective measures were completed at Evaluation unless otherwise noted.  PATIENT SURVEYS:  FOTO  35.54.  OBSERVATION:  Right hip incisional site appears to be healing well.  POSTURE:  Equal leg lengths.  PALPATION: Mild tenderness on either side of incisional site.  LOWER EXTREMITY MMT:  Normal right ankle strength.  Patient easily able to perform a right SAQ and perform hip abduction in supine.  TRANSFERS:    Patient transferred from sit to supine and supine to sit with supervision only.   GAIT: The patient is walking with a FWW with essentially normal gait pattern.  TODAY'S TREATMENT:                                   03/25/23 EXERCISE LOG  Exercise Repetitions and Resistance Comments  Nustep  L3 x 17 minutes   Rocker board  5 minutes BUE support   Side-Stepping Airex x 5 reps   Tandem Gait Airex x 5 reps   Marching on foam  3 minutes  BUE support   LAQ 4# x 3 minutes  Alternating LE  Seated HS curl  Red t-band x 25 reps each    Sit to stand  20 reps  From elevated mat table  Seated hip ADD isometric  3.5 minutes w/ 5 second hold     Blank cell = exercise not performed today                                    03/06/23 EXERCISE LOG    RT hip  Exercise Repetitions and Resistance Comments  Nustep Lvl 3 x 15 mins   Rockerboard 4 mins   6in box toe taps 3x10   Side stepping In // bars 5 laps  UE assist   Walking forward/ back // bars X 5 laps   with UE   LAQs 2# x 20 reps pause at top   Seated marches 2# x 20 reps   Seated Hip Abduction Red x 3 mins   Seated Hip Adduction    Seated Ham Curls `   STS     Blank cell = exercise not performed today   PATIENT EDUCATION:  Education details: Discussed HEP that she received from hospital. Person educated: Patient Education method: Medical illustrator Education comprehension: verbalized understanding and returned demonstration  HOME EXERCISE PROGRAM:   ASSESSMENT:  CLINICAL IMPRESSION: Pt arrives for today's treatment session denying any pain.  Pt able to increase FOTO score to 65 today.  Pt  introduced to additional balance exercises today with use of bilateral and single UE support for safety and stability.  Pt encouraged to use her cane when coming to her next treatment session on Thursday.  Pt reports that her hip is feeling much better since beginning therapy.  Pt denied any pain at completion of today's treatment session.  OBJECTIVE IMPAIRMENTS: decreased activity tolerance and pain.   ACTIVITY LIMITATIONS: standing and stairs  PARTICIPATION LIMITATIONS: laundry and community activity  PERSONAL FACTORS: Time since onset of injury/illness/exacerbation are also  affecting patient's functional outcome.   REHAB POTENTIAL: Excellent  CLINICAL DECISION MAKING: Stable/uncomplicated  EVALUATION COMPLEXITY: Low   GOALS:  LONG TERM GOALS: Target date: 04/11/23  Ind with a HEP.  Goal status: IN PROGRESS  2.  Patient walk a community distance with a pain-level of not > 3/10.  Goal status: IN PROGRESS  3.  Perform a reciprocating stair gait with one railing.  Goal status: IN PROGRESS  4.  Perform ADL's with pain not > 3/10.  Goal status: IN PROGRESS  PLAN:  PT FREQUENCY: 2x/week  PT DURATION: 6 weeks  PLANNED INTERVENTIONS: 97110-Therapeutic exercises, 97530- Therapeutic activity, [id number]- Neuromuscular re-education, 97535- Self Care, [id number]- Manual therapy, [phone]- Gait training, 97014- Electrical stimulation (unattended), Patient/Family education, Stair training, Cryotherapy, and Moist heat  PLAN FOR NEXT SESSION: Nustep, Gait activities, LE therex.     Newman Pies, PTA 03/25/2023, 4:45 PM

## 2023-03-27 ENCOUNTER — Ambulatory Visit: Payer: Medicare Other

## 2023-03-27 DIAGNOSIS — M25552 Pain in left hip: Secondary | ICD-10-CM

## 2023-03-27 DIAGNOSIS — M6281 Muscle weakness (generalized): Secondary | ICD-10-CM

## 2023-03-27 NOTE — Therapy (Signed)
OUTPATIENT PHYSICAL THERAPY LOWER EXTREMITY TREATMENT   Patient Name: SHYLEIGH IBRAHIM MRN: [id number] DOB:23-Sep-1940, 82 y.o., female Today's Date: 03/27/2023  END OF SESSION:  PT End of Session - 03/27/23 1311     Visit Number 6    Number of Visits 12    Date for PT Re-Evaluation 04/11/23    Authorization Type FOTO.    PT Start Time 1302    PT Stop Time 1347    PT Time Calculation (min) 45 min    Activity Tolerance Patient tolerated treatment well    Behavior During Therapy WFL for tasks assessed/performed               Past Medical History:  Diagnosis Date   Anxiety    Arthritis    Knees, hips   CAD (coronary artery disease)    Chronic back pain    Depression    GERD (gastroesophageal reflux disease)    Hyperlipidemia    Hypertension    MI (myocardial infarction) (HCC)    Osteopenia    Past Surgical History:  Procedure Laterality Date   BIOPSY  07/16/2021   Procedure: BIOPSY;  Surgeon: Lanelle Bal, DO;  [facility]: [facility];  Service: Endoscopy;;   COLONOSCOPY WITH PROPOFOL N/A 07/16/2021   Procedure: COLONOSCOPY WITH PROPOFOL;  Surgeon: Lanelle Bal, DO;  [facility]: [facility];  Service: Endoscopy;  Laterality: N/A;  10:30am   ECTOPIC PREGNANCY SURGERY     ESOPHAGOGASTRODUODENOSCOPY (EGD) WITH PROPOFOL N/A 07/16/2021   Procedure: ESOPHAGOGASTRODUODENOSCOPY (EGD) WITH PROPOFOL;  Surgeon: Lanelle Bal, DO;  [facility]: [facility];  Service: Endoscopy;  Laterality: N/A;   left breast biospy     left wrist fracture     TOTAL HIP ARTHROPLASTY Right 02/17/2023   Procedure: RIGHT TOTAL HIP ARTHROPLASTY ANTERIOR APPROACH;  Surgeon: Gean Birchwood, MD;  Location: [facility];  Service: Orthopedics;  Laterality: Right;   TOTAL KNEE ARTHROPLASTY Left 01/25/2019   Procedure: LEFT TOTAL KNEE ARTHROPLASTY;  Surgeon: Gean Birchwood, MD;  Location: [facility];  Service: Orthopedics;  Laterality: Left;   Patient Active Problem List   Diagnosis Date Noted   S/P  total right hip arthroplasty 02/17/2023   Osteoarthritis of right hip 02/14/2023   Hypokalemia 09/05/2022   Anemia 02/05/2022   Constipation 10/29/2021   Helicobacter pylori gastritis 10/29/2021   Status post total left knee replacement 01/25/2019   Pain management contract agreement 02/22/2016   Opioid type dependence, continuous (HCC) 02/22/2016   BMI 30.0-30.9,adult 01/02/2015   Osteoarthritis of left hip 08/23/2013   Depression 08/23/2013   Osteoporosis 06/23/2013   Glaucoma of right eye 06/23/2013   CAD (coronary artery disease) 08/19/2012   History of MI (myocardial infarction) 08/19/2012   Arthritis of both knees 08/19/2012   Essential hypertension, benign 08/10/2012   Hyperlipidemia with target LDL less than 100 08/10/2012   GERD (gastroesophageal reflux disease) 08/10/2012    REFERRING PROVIDER: Gean Birchwood MD  REFERRING DIAG: Rt anterior total hip arthroplasty  THERAPY DIAG:  Pain in left hip  Muscle weakness (generalized)  Rationale for Evaluation and Treatment: Rehabilitation  ONSET DATE: Surgery date (02/17/23).  SUBJECTIVE:   SUBJECTIVE STATEMENT: Patient reports that she is hurting a little today, but not bad. She is still having trouble with getting up and down and she also does not feel comfortable walking without a cane yet.   PERTINENT HISTORY: Left TKA. PAIN:  Are you having pain? Yes: NPRS scale: 5/10 Pain location: Right hip. Pain description:  Ache. Aggravating factors: Increased activity. Relieving factors: Rest.  PRECAUTIONS: Anterior hip.  No ultrasound.   WEIGHT BEARING RESTRICTIONS: No  FALLS:  Has patient fallen in last 6 months? No  LIVING ENVIRONMENT: Lives in: House/apartment Stairs:  external. Use railing.  Non-reciprocating fashion. Has following equipment at home: Dan Humphreys - 2 wheeled   PLOF: Independent with basic ADLs and Independent with household mobility with device  PATIENT GOALS: Move better without right hip  pain.   OBJECTIVE:  Note: Objective measures were completed at Evaluation unless otherwise noted.  PATIENT SURVEYS:  FOTO 50.16 on 03/27/23  OBSERVATION:  Right hip incisional site appears to be healing well.  POSTURE:  Equal leg lengths.  PALPATION: Mild tenderness on either side of incisional site.  LOWER EXTREMITY MMT:  Normal right ankle strength.  Patient easily able to perform a right SAQ and perform hip abduction in supine.  TRANSFERS:    Patient transferred from sit to supine and supine to sit with supervision only.   GAIT: The patient is walking with a FWW with essentially normal gait pattern.  TODAY'S TREATMENT:                                   03/27/23 EXERCISE LOG  Exercise Repetitions and Resistance Comments  Nustep  L4 x 16 minutes   LAQ 5# x 3 minutes  Alternating LE   Seated marching   20 reps each    Sit to stand  10 reps  Without UE support from elevated seat   Seated hip ADD isometric  3 minutes w/ 5 seconds   Step up  6" step x 12 reps  Leading with RLE   Rocker board  4.5 minutes     Blank cell = exercise not performed today                                    03/25/23 EXERCISE LOG  Exercise Repetitions and Resistance Comments  Nustep  L3 x 17 minutes   Rocker board  5 minutes BUE support   Side-Stepping Airex x 5 reps   Tandem Gait Airex x 5 reps   Marching on foam  3 minutes  BUE support   LAQ 4# x 3 minutes  Alternating LE  Seated HS curl  Red t-band x 25 reps each    Sit to stand  20 reps  From elevated mat table  Seated hip ADD isometric  3.5 minutes w/ 5 second hold     Blank cell = exercise not performed today                                    03/06/23 EXERCISE LOG    RT hip  Exercise Repetitions and Resistance Comments  Nustep Lvl 3 x 15 mins   Rockerboard 4 mins   6in box toe taps 3x10   Side stepping In // bars 5 laps  UE assist   Walking forward/ back // bars X 5 laps   with UE   LAQs 2# x 20 reps pause at top   Seated  marches 2# x 20 reps   Seated Hip Abduction Red x 3 mins   Seated Hip Adduction    Seated Ham Curls `   STS  Blank cell = exercise not performed today   PATIENT EDUCATION:  Education details: progress with therapy Person educated: Patient Education method: Medical illustrator Education comprehension: verbalized understanding and returned demonstration  HOME EXERCISE PROGRAM:   ASSESSMENT:  CLINICAL IMPRESSION: Patient is making good progress with skilled physical therapy as evidenced by her subjective reports, functional mobility, and progress toward her goals. She was able to demonstrate improved function walking as she was able to ambulate with a large based quad cane. However, she continues to experience increased difficulty with transfers and navigating stairs. She was progressed with multiple new and familiar interventions for improved lower extremity strength for improved function with daily activities such as transfers. She required minimal cueing with sit to stand transfers to avoid to utilizing her upper extremities. She experienced a mild increase in right hip discomfort with seated hip adduction, but this did not persist into any of today's other interventions. Recommend that she continue with her current plan of care to address her remaining impairments to return to her prior level of function.   OBJECTIVE IMPAIRMENTS: decreased activity tolerance and pain.   ACTIVITY LIMITATIONS: standing and stairs  PARTICIPATION LIMITATIONS: laundry and community activity  PERSONAL FACTORS: Time since onset of injury/illness/exacerbation are also affecting patient's functional outcome.   REHAB POTENTIAL: Excellent  CLINICAL DECISION MAKING: Stable/uncomplicated  EVALUATION COMPLEXITY: Low   GOALS:  LONG TERM GOALS: Target date: 04/11/23  Ind with a HEP.  Goal status: MET  2.  Patient walk a community distance with a pain-level of not > 3/10.   Up to 5/10 as of  03/27/23 Goal status: IN PROGRESS  3.  Perform a reciprocating stair gait with one railing.   Step to pattern as of 03/27/23 Goal status: IN PROGRESS  4.  Perform ADL's with pain not > 3/10.   Up to 5/10 as of 03/27/23 Goal status: IN PROGRESS  PLAN:  PT FREQUENCY: 2x/week  PT DURATION: 6 weeks  PLANNED INTERVENTIONS: 97110-Therapeutic exercises, 97530- Therapeutic activity, 97112- Neuromuscular re-education, 97535- Self Care, 19147- Manual therapy, [phone]- Gait training, 97014- Electrical stimulation (unattended), Patient/Family education, Stair training, Cryotherapy, and Moist heat  PLAN FOR NEXT SESSION: Nustep, Gait activities, LE therex.     Granville Lewis, PT 03/27/2023, 3:12 PM

## 2023-03-31 ENCOUNTER — Ambulatory Visit: Payer: Medicare Other

## 2023-03-31 DIAGNOSIS — M25552 Pain in left hip: Secondary | ICD-10-CM

## 2023-03-31 DIAGNOSIS — M6281 Muscle weakness (generalized): Secondary | ICD-10-CM | POA: Diagnosis not present

## 2023-03-31 NOTE — Therapy (Signed)
OUTPATIENT PHYSICAL THERAPY LOWER EXTREMITY TREATMENT   Patient Name: Teresa Holloway MRN: [id number] DOB:1940/06/17, 82 y.o., female Today's Date: 03/31/2023  END OF SESSION:  PT End of Session - 03/31/23 1346     Visit Number 7    Number of Visits 12    Date for PT Re-Evaluation 04/11/23    Authorization Type FOTO.    PT Start Time 1345    PT Stop Time 1426    PT Time Calculation (min) 41 min    Activity Tolerance Patient tolerated treatment well    Behavior During Therapy WFL for tasks assessed/performed                Past Medical History:  Diagnosis Date   Anxiety    Arthritis    Knees, hips   CAD (coronary artery disease)    Chronic back pain    Depression    GERD (gastroesophageal reflux disease)    Hyperlipidemia    Hypertension    MI (myocardial infarction) (HCC)    Osteopenia    Past Surgical History:  Procedure Laterality Date   BIOPSY  07/16/2021   Procedure: BIOPSY;  Surgeon: Lanelle Bal, DO;  [facility]: [facility];  Service: Endoscopy;;   COLONOSCOPY WITH PROPOFOL N/A 07/16/2021   Procedure: COLONOSCOPY WITH PROPOFOL;  Surgeon: Lanelle Bal, DO;  [facility];  Service: Endoscopy;  Laterality: N/A;  10:30am   ECTOPIC PREGNANCY SURGERY     ESOPHAGOGASTRODUODENOSCOPY (EGD) WITH PROPOFOL N/A 07/16/2021   Procedure: ESOPHAGOGASTRODUODENOSCOPY (EGD) WITH PROPOFOL;  Surgeon: Lanelle Bal, DO;  [facility];  Service: Endoscopy;  Laterality: N/A;   left breast biospy     left wrist fracture     TOTAL HIP ARTHROPLASTY Right 02/17/2023   Procedure: RIGHT TOTAL HIP ARTHROPLASTY ANTERIOR APPROACH;  Surgeon: Gean Birchwood, MD;  Location: [facility];  Service: Orthopedics;  Laterality: Right;   TOTAL KNEE ARTHROPLASTY Left 01/25/2019   Procedure: LEFT TOTAL KNEE ARTHROPLASTY;  Surgeon: Gean Birchwood, MD;  Location: [facility];  Service: Orthopedics;  Laterality: Left;   Patient Active Problem List   Diagnosis Date Noted   S/P  total right hip arthroplasty 02/17/2023   Osteoarthritis of right hip 02/14/2023   Hypokalemia 09/05/2022   Anemia 02/05/2022   Constipation 10/29/2021   Helicobacter pylori gastritis 10/29/2021   Status post total left knee replacement 01/25/2019   Pain management contract agreement 02/22/2016   Opioid type dependence, continuous (HCC) 02/22/2016   BMI 30.0-30.9,adult 01/02/2015   Osteoarthritis of left hip 08/23/2013   Depression 08/23/2013   Osteoporosis 06/23/2013   Glaucoma of right eye 06/23/2013   CAD (coronary artery disease) 08/19/2012   History of MI (myocardial infarction) 08/19/2012   Arthritis of both knees 08/19/2012   Essential hypertension, benign 08/10/2012   Hyperlipidemia with target LDL less than 100 08/10/2012   GERD (gastroesophageal reflux disease) 08/10/2012    REFERRING PROVIDER: Gean Birchwood MD  REFERRING DIAG: Rt anterior total hip arthroplasty  THERAPY DIAG:  Pain in left hip  Muscle weakness (generalized)  Rationale for Evaluation and Treatment: Rehabilitation  ONSET DATE: Surgery date (02/17/23).  SUBJECTIVE:   SUBJECTIVE STATEMENT: Patient reports that she feels good today and is not hurting. She goes to see her surgeon tomorrow (04/01/23). She feels that she is about 70% better since starting therapy.   PERTINENT HISTORY: Left TKA. PAIN:  Are you having pain? Yes: NPRS scale: 0/10 Pain location: Right hip. Pain description: Ache. Aggravating factors:  Increased activity. Relieving factors: Rest.  PRECAUTIONS: Anterior hip.  No ultrasound.   WEIGHT BEARING RESTRICTIONS: No  FALLS:  Has patient fallen in last 6 months? No  LIVING ENVIRONMENT: Lives in: House/apartment Stairs:  external. Use railing.  Non-reciprocating fashion. Has following equipment at home: Dan Humphreys - 2 wheeled   PLOF: Independent with basic ADLs and Independent with household mobility with device  PATIENT GOALS: Move better without right hip  pain.   OBJECTIVE:  Note: Objective measures were completed at Evaluation unless otherwise noted.  PATIENT SURVEYS:  FOTO 50.16 on 03/27/23  OBSERVATION:  Right hip incisional site appears to be healing well.  POSTURE:  Equal leg lengths.  PALPATION: Mild tenderness on either side of incisional site.  LOWER EXTREMITY MMT:  Normal right ankle strength.  Patient easily able to perform a right SAQ and perform hip abduction in supine.  TRANSFERS:    Patient transferred from sit to supine and supine to sit with supervision only.   GAIT: The patient is walking with a FWW with essentially normal gait pattern.  TODAY'S TREATMENT:                                   03/31/23 EXERCISE LOG  Exercise Repetitions and Resistance Comments  Nustep  L4 x 19 minutes    Marching on foam  3 minutes   Step up  6" step x 2 minutes  Alternating LE  Side stepping on foam  3 minutes  BUE support  Rocker board  5 minutes   Sit to stand 20 reps  From standard height chair with UE support   Blank cell = exercise not performed today                                    03/27/23 EXERCISE LOG  Exercise Repetitions and Resistance Comments  Nustep  L4 x 16 minutes   LAQ 5# x 3 minutes  Alternating LE   Seated marching   20 reps each    Sit to stand  10 reps  Without UE support from elevated seat   Seated hip ADD isometric  3 minutes w/ 5 seconds   Step up  6" step x 12 reps  Leading with RLE   Rocker board  4.5 minutes     Blank cell = exercise not performed today                                    03/25/23 EXERCISE LOG  Exercise Repetitions and Resistance Comments  Nustep  L3 x 17 minutes   Rocker board  5 minutes BUE support   Side-Stepping Airex x 5 reps   Tandem Gait Airex x 5 reps   Marching on foam  3 minutes  BUE support   LAQ 4# x 3 minutes  Alternating LE  Seated HS curl  Red t-band x 25 reps each    Sit to stand  20 reps  From elevated mat table  Seated hip ADD isometric  3.5  minutes w/ 5 second hold     Blank cell = exercise not performed today   PATIENT EDUCATION:  Education details: progress with therapy Person educated: Patient Education method: Explanation and Demonstration Education comprehension: verbalized understanding and returned demonstration  HOME EXERCISE PROGRAM:   ASSESSMENT:  CLINICAL IMPRESSION: Patient was progressed with multiple familiar interventions for improved lower extremity strength needed improved functional mobility. She required minimal cueing with step ups to alternate lower extremities to simulate a reciprocal pattern. She experienced no pain or discomfort with any of today's interventions. She reported feeling "a little tired" upon the conclusion of treatment. She continues to require skilled physical therapy to address her remaining impairments to return to her prior level of function.   OBJECTIVE IMPAIRMENTS: decreased activity tolerance and pain.   ACTIVITY LIMITATIONS: standing and stairs  PARTICIPATION LIMITATIONS: laundry and community activity  PERSONAL FACTORS: Time since onset of injury/illness/exacerbation are also affecting patient's functional outcome.   REHAB POTENTIAL: Excellent  CLINICAL DECISION MAKING: Stable/uncomplicated  EVALUATION COMPLEXITY: Low   GOALS:  LONG TERM GOALS: Target date: 04/11/23  Ind with a HEP.  Goal status: MET  2.  Patient walk a community distance with a pain-level of not > 3/10.   Up to 5/10 as of 03/27/23 Goal status: IN PROGRESS  3.  Perform a reciprocating stair gait with one railing.   Step to pattern as of 03/27/23 Goal status: IN PROGRESS  4.  Perform ADL's with pain not > 3/10.   Up to 5/10 as of 03/27/23 Goal status: IN PROGRESS  PLAN:  PT FREQUENCY: 2x/week  PT DURATION: 6 weeks  PLANNED INTERVENTIONS: 97110-Therapeutic exercises, 97530- Therapeutic activity, 97112- Neuromuscular re-education, 97535- Self Care, 16109- Manual therapy, [phone]- Gait  training, 97014- Electrical stimulation (unattended), Patient/Family education, Stair training, Cryotherapy, and Moist heat  PLAN FOR NEXT SESSION: Nustep, Gait activities, LE therex.     Granville Lewis, PT 03/31/2023, 2:30 PM

## 2023-04-08 ENCOUNTER — Ambulatory Visit: Payer: Medicare Other

## 2023-04-14 ENCOUNTER — Encounter: Payer: Self-pay | Admitting: Gastroenterology

## 2023-04-14 ENCOUNTER — Ambulatory Visit (INDEPENDENT_AMBULATORY_CARE_PROVIDER_SITE_OTHER): Payer: Medicare Other | Admitting: Gastroenterology

## 2023-04-14 VITALS — BP 134/62 | HR 82 | Temp 97.7°F | Ht 66.0 in | Wt 141.2 lb

## 2023-04-14 DIAGNOSIS — D649 Anemia, unspecified: Secondary | ICD-10-CM | POA: Diagnosis not present

## 2023-04-14 DIAGNOSIS — K5900 Constipation, unspecified: Secondary | ICD-10-CM | POA: Diagnosis not present

## 2023-04-14 DIAGNOSIS — B9681 Helicobacter pylori [H. pylori] as the cause of diseases classified elsewhere: Secondary | ICD-10-CM

## 2023-04-14 DIAGNOSIS — Z8619 Personal history of other infectious and parasitic diseases: Secondary | ICD-10-CM | POA: Diagnosis not present

## 2023-04-14 DIAGNOSIS — D509 Iron deficiency anemia, unspecified: Secondary | ICD-10-CM | POA: Diagnosis not present

## 2023-04-14 DIAGNOSIS — K2970 Gastritis, unspecified, without bleeding: Secondary | ICD-10-CM | POA: Diagnosis not present

## 2023-04-14 NOTE — Progress Notes (Signed)
GI Office Note    Referring Provider: Bennie Pierini, * Primary Care Physician:  Bennie Pierini, FNP Primary Gastroenterologist: [name]. [name], DO  Date:  04/14/2023  ID:  Teresa Holloway, DOB 05-23-1940, MRN [id number]   Chief Complaint   Chief Complaint  Patient presents with   Follow-up    Follow up. Thinks she has H.pylori stomach hurts sometime and stool looks different    History of Present Illness  Teresa Holloway is a 82 y.o. female with a history of chronic constipation, H. pylori gastritis s/p treatment and confirmed eradication by stool antigen x 2, anemia, CAD, MI, anxiety/depression, HLD, and HTN presenting today with concern for H. Pylori again given change in stool.   Colonoscopy March 2023: - Non-bleeding internal hemorrhoids. - The examination was otherwise normal. - No specimens collected.   EGD March 2023: - Z-line regular, 39 cm from the incisors. - Gastritis. Biopsied. - Normal duodenal bulb, first portion of the duodenum and second portion of the duodenum. - Small hiatal hernia. -Gastric biopsy showed H. pylori associated gastritis.  Patient treated with clarithromycin and amoxicillin along with pantoprazole 40 mg twice daily.  Last office visit 10/21/22.  She was concerned her H. pylori was back.  She was having similar symptoms to what she was before with stomach growling which seem to improve after H. pylori treatment.  She denied any abdominal pain.  Denied any urgency, usually having 1 soft stool daily.  Denied any BRBPR or melena.  Had seen some small dark specks but thought maybe something she ate.  Denied any heartburn, nausea, vomiting, dysphagia, weight loss.  Reported good appetite and taking pantoprazole twice daily.  Taking MiraLAX every night.  Advised to complete iFOBT.  Complete H. pylori stool antigen test but need to hold PPI for 2 weeks. Is a stop iron if she was on it.  Plan to reduce pantoprazole to once daily.  Add  probiotic to help with stomach growling.   H. pylori stool antigen test completed on 7/5 and 7/10.  They were both negative however she was on PPI at that time.     Latest Ref Rng & Units 02/05/2023    9:23 AM 10/30/2022    9:37 AM 09/05/2022   10:44 AM  CBC  WBC 4.0 - 10.5 K/uL 6.7  4.3  5.4   Hemoglobin 12.0 - 15.0 g/dL 40.9  81.1  91.4   Hematocrit 36.0 - 46.0 % 34.6  33.9  34.2   Platelets 150 - 400 K/uL 218  226  217     Iron/TIBC/Ferritin/ %Sat    Component Value Date/Time   IRON 99 10/30/2022 0937   TIBC 343 10/30/2022 0937   FERRITIN 35 10/30/2022 0937   IRONPCTSAT 29 10/30/2022 0937    Recent CBC 03/24/2023: Hemoglobin 9.3, MCV 92.3, platelets 239, alk phos 111, AST 9, ALT 5.  Normal TSH   Today: Patient reports concern that she has H. pylori again.  She states her stools are back to the same way they were when she had the infection.  She states her stools are normal brown in color but do have some darker specks in it.  She states she has a good appetite and denies any weight loss.  Her stools have been soft and she does not have to strain, she continues to take MiraLAX nightly.  She denies any overt melena or BRBPR.  Denies any abdominal pain at all, nausea, vomiting, dysphagia.  She also denies any  chest pain or shortness of breath.  Per her med list she has not taken any PPI or iron currently.  She did have hip surgery 10/28 and has been recovering well and continuing to follow with PT.  Wt Readings from Last 3 Encounters:  04/14/23 141 lb 3.2 oz (64 kg)  03/17/23 139 lb (63 kg)  02/17/23 139 lb (63 kg)    Current Outpatient Medications  Medication Sig Dispense Refill   amLODipine (NORVASC) 5 MG tablet Take 1 tablet (5 mg total) by mouth daily. (Patient taking differently: Take 5 mg by mouth at bedtime.) 90 tablet 1   aspirin EC 81 MG tablet Take 1 tablet (81 mg total) by mouth 2 (two) times daily. (Patient taking differently: Take 81 mg by mouth daily.) 60 tablet 0    atorvastatin (LIPITOR) 40 MG tablet TAKE 1 TABLET BY MOUTH DAILY AT 6 PM. 90 tablet 1   benazepril (LOTENSIN) 40 MG tablet Take 1 tablet (40 mg total) by mouth daily. (Patient taking differently: Take 40 mg by mouth at bedtime.) 90 tablet 1   bisacodyl (DULCOLAX) 5 MG EC tablet Take 5 mg by mouth at bedtime.     brimonidine (ALPHAGAN) 0.2 % ophthalmic solution Place 1 drop into both eyes daily.     cholecalciferol (VITAMIN D) 1000 UNITS tablet Take 1,000 Units by mouth daily.     cyanocobalamin (VITAMIN B12) 1000 MCG tablet Take 1,000 mcg by mouth daily.     diclofenac sodium (VOLTAREN) 1 % GEL Apply 2 g topically 4 (four) times daily. (Patient taking differently: Apply 2 g topically 4 (four) times daily as needed (pain).) 500 g 1   escitalopram (LEXAPRO) 20 MG tablet Take 1 tablet (20 mg total) by mouth daily. (Patient taking differently: Take 20 mg by mouth at bedtime.) 90 tablet 1   hydrochlorothiazide (HYDRODIURIL) 25 MG tablet Take 1 tablet (25 mg total) by mouth daily. 90 tablet 1   HYDROmorphone (DILAUDID) 2 MG tablet Take 1 tablet (2 mg total) by mouth every 6 (six) hours as needed for severe pain (pain score 7-10). 20 tablet 0   isosorbide mononitrate (IMDUR) 60 MG 24 hr tablet TAKE 1 TABLET BY MOUTH EVERYDAY AT BEDTIME 90 tablet 1   latanoprost (XALATAN) 0.005 % ophthalmic solution Place 1 drop into both eyes at bedtime.     metoprolol succinate (TOPROL-XL) 25 MG 24 hr tablet Take 1 tablet (25 mg total) by mouth daily. 30 tablet 11   nitroGLYCERIN (NITROSTAT) 0.4 MG SL tablet Place 1 tablet (0.4 mg total) under the tongue every 5 (five) minutes as needed for chest pain. 30 tablet 1   polyethylene glycol powder (GLYCOLAX/MIRALAX) 17 GM/SCOOP powder Take 17 g by mouth daily.     potassium chloride (KLOR-CON M10) 10 MEQ tablet Take 1 tablet (10 mEq total) by mouth 2 (two) times daily. (Patient taking differently: Take 10 mEq by mouth daily.) 180 tablet 1   Probiotic CHEW Chew 1 capsule by  mouth daily.     tiZANidine (ZANAFLEX) 2 MG tablet Take 1 tablet (2 mg total) by mouth every 6 (six) hours as needed for muscle spasms. 60 tablet 0   [START ON 05/16/2023] traMADol (ULTRAM) 50 MG tablet Take 1 tablet (50 mg total) by mouth 2 (two) times daily. 60 tablet 0   [START ON 04/16/2023] traMADol (ULTRAM) 50 MG tablet Take 1 tablet (50 mg total) by mouth 2 (two) times daily. 60 tablet 0   traMADol (ULTRAM) 50 MG tablet Take  1 tablet (50 mg total) by mouth 2 (two) times daily. 60 tablet 0   aspirin EC 81 MG tablet Take 1 tablet (81 mg total) by mouth 2 (two) times daily. 60 tablet 0   No current facility-administered medications for this visit.    Past Medical History:  Diagnosis Date   Anxiety    Arthritis    Knees, hips   CAD (coronary artery disease)    Chronic back pain    Depression    GERD (gastroesophageal reflux disease)    Hyperlipidemia    Hypertension    MI (myocardial infarction) (HCC)    Osteopenia     Past Surgical History:  Procedure Laterality Date   BIOPSY  07/16/2021   Procedure: BIOPSY;  Surgeon: Lanelle Bal, DO;  Location: [facility];  Service: Endoscopy;;   COLONOSCOPY WITH PROPOFOL N/A 07/16/2021   Procedure: COLONOSCOPY WITH PROPOFOL;  Surgeon: Lanelle Bal, DO;  Location: [facility];  Service: Endoscopy;  Laterality: N/A;  10:30am   ECTOPIC PREGNANCY SURGERY     ESOPHAGOGASTRODUODENOSCOPY (EGD) WITH PROPOFOL N/A 07/16/2021   Procedure: ESOPHAGOGASTRODUODENOSCOPY (EGD) WITH PROPOFOL;  Surgeon: Lanelle Bal, DO;  Location: [facility];  Service: Endoscopy;  Laterality: N/A;   left breast biospy     left wrist fracture     TOTAL HIP ARTHROPLASTY Right 02/17/2023   Procedure: RIGHT TOTAL HIP ARTHROPLASTY ANTERIOR APPROACH;  Surgeon: Gean Birchwood, MD;  Location: [facility];  Service: Orthopedics;  Laterality: Right;   TOTAL KNEE ARTHROPLASTY Left 01/25/2019   Procedure: LEFT TOTAL KNEE ARTHROPLASTY;  Surgeon: Gean Birchwood, MD;  Location:  [facility];  Service: Orthopedics;  Laterality: Left;    Family History  Problem Relation Age of Onset   Asthma Father    Diabetes Sister    Diabetes Brother     Allergies as of 04/14/2023 - Review Complete 04/14/2023  Allergen Reaction Noted   Atarax [hydroxyzine] Nausea And Vomiting 08/10/2012   Bactrim [sulfamethoxazole-trimethoprim] Nausea Only 03/23/2015   Coreg [carvedilol] Other (See Comments) 08/10/2012    Social History   Socioeconomic History   Marital status: Widowed    Spouse name: [name]   Number of children: 1   Years of education: Not on file   Highest education level: 10th grade  Occupational History   Occupation: Retired  Tobacco Use   Smoking status: Never   Smokeless tobacco: Never  Vaping Use   Vaping status: Never Used  Substance and Sexual Activity   Alcohol use: No   Drug use: No   Sexual activity: Not Currently    Birth control/protection: Post-menopausal  Other Topics Concern   Not on file  Social History Narrative   Husband passed 12/2020.    1 son-[name].   2 grandchildren   Twin great grandchildren.   Social Drivers of Corporate investment banker Strain: Low Risk  (08/21/2022)   Overall Financial Resource Strain (CARDIA)    Difficulty of Paying Living Expenses: Not hard at all  Food Insecurity: Low Risk  (03/24/2023)   Received from [facility]   Hunger Vital Sign    Worried About Running Out of Food in the Last Year: Never true    Ran Out of Food in the Last Year: Never true  Transportation Needs: No Transportation Needs (03/24/2023)   Received from [facility]    In the past 12 months, has lack of reliable transportation kept you from medical appointments, meetings, work or from getting  things needed for daily living? : No  Physical Activity: Sufficiently Active (08/21/2022)   Exercise Vital Sign    Days of Exercise per Week: 5 days    Minutes of Exercise per Session: 30 min  Stress: No Stress Concern Present  (08/21/2022)   [employer] of Occupational Health - Occupational Stress Questionnaire    Feeling of Stress : Not at all  Social Connections: Moderately Isolated (08/21/2022)   Social Connection and Isolation Panel [NHANES]    Frequency of Communication with Friends and Family: More than three times a week    Frequency of Social Gatherings with Friends and Family: More than three times a week    Attends Religious Services: 1 to 4 times per year    Active Member of [employer] or Organizations: No    Attends Banker Meetings: Never    Marital Status: Widowed     Review of Systems   Gen: Denies fever, chills, anorexia. Denies fatigue, weakness, weight loss.  CV: Denies chest pain, palpitations, syncope, peripheral edema, and claudication. Resp: Denies dyspnea at rest, cough, wheezing, coughing up blood, and pleurisy. GI: See HPI Derm: Denies rash, itching, dry skin Psych: Denies depression, anxiety, memory loss, confusion. No homicidal or suicidal ideation.  Heme: Denies bruising, bleeding, and enlarged lymph nodes.  Physical Exam   BP 134/62 (BP Location: Right Arm, Patient Position: Sitting, Cuff Size: Normal)   Pulse 82   Temp 97.7 F (36.5 C) (Temporal)   Ht 5\' 6"  (1.676 m)   Wt 141 lb 3.2 oz (64 kg)   BMI 22.79 kg/m   General:   Alert and oriented. No distress noted. Pleasant and cooperative.  Head:  Normocephalic and atraumatic. Eyes:  Conjuctiva clear without scleral icterus. Abdomen:  +BS, soft, non-tender and non-distended. No rebound or guarding. No HSM or masses noted. Rectal: deferred Neurologic:  Alert and oriented x4 Psych:  Alert and cooperative. Normal mood and affect.  Assessment  SHANEA MONTINI is a 82 y.o. female with a history of chronic constipation, H. pylori gastritis s/p treatment and confirmed eradication by stool antigen x 2, anemia, CAD, MI, anxiety/depression, HLD, and HTN presenting today with   Change in stool, hx H. Pylori: History  of H. pylori gastritis.  Has been treated with documented eradication x 2.  She was rechecked again in July of this year however at the time she was taking PPI therefore could have been a false negative.  Her current symptoms do not indicate potential H. pylori infection however sometimes can be fairly asymptomatic.  Given this we will retest H. pylori again today given patient's concern given she is off PPI.  Although no complaints of overt melena we will also check FOBT as noted below given her anemia.  Anemia: She does have a history of iron deficiency anemia.  Hemoglobin and iron panel in July within normal limits.  Most recently had a decline in her hemoglobin with her most recent hemoglobin being 9.3 with normocytic indices the beginning of this month.  In July she was normal in the 11 range and was 10.6 in October.  Some of this decline could be in the setting of her recent hip surgery.  She is not currently on any iron therapy therefore we will recheck CBC and iron panel today to determine if he resumed.  She denies any NSAIDs.  Will also check FOBT.   Constipation: Well-controlled with MiraLAX 17 g once daily.  PLAN   H. pylori stool test  CBC, Iron panel, FOBT Continue Miralax 17g daily.  Follow up 3 months with LSL   Brooke Bonito, MSN, FNP-BC, AGACNP-BC [facility]

## 2023-04-14 NOTE — Patient Instructions (Addendum)
Please have blood work completed at [facility].  We will call you with results once they have been received. Please allow 3-5 business days for review. 2 locations for [facility] in [place]:              1. [address], [place]              2. [address] [place], [place]   After I receive her blood work we will be in touch if we need to resume iron or not.  We will need to test your stool as well for blood.  Monitor for any overt blood in your stool.  Please notify me if you begin to have any upper abdominal pain, nausea, or vomiting.  Follow-up in 3 months.  It was a pleasure to see you today. I want to create trusting relationships with patients. If you receive a survey regarding your visit,  I greatly appreciate you taking time to fill this out on paper or through your MyChart. I value your feedback.  Brooke Bonito, MSN, FNP-BC, AGACNP-BC [facility]

## 2023-04-15 LAB — CBC
Hematocrit: 30.3 % — ABNORMAL LOW (ref 34.0–46.6)
Hemoglobin: 9.6 g/dL — ABNORMAL LOW (ref 11.1–15.9)
MCH: 28.5 pg (ref 26.6–33.0)
MCHC: 31.7 g/dL (ref 31.5–35.7)
MCV: 90 fL (ref 79–97)
Platelets: 298 10*3/uL (ref 150–450)
RBC: 3.37 x10E6/uL — ABNORMAL LOW (ref 3.77–5.28)
RDW: 12.3 % (ref 11.7–15.4)
WBC: 6.6 10*3/uL (ref 3.4–10.8)

## 2023-04-15 LAB — IRON,TIBC AND FERRITIN PANEL
Ferritin: 40 ng/mL (ref 15–150)
Iron Saturation: 7 % — CL (ref 15–55)
Iron: 26 ug/dL — ABNORMAL LOW (ref 27–139)
Total Iron Binding Capacity: 368 ug/dL (ref 250–450)
UIBC: 342 ug/dL (ref 118–369)

## 2023-04-17 ENCOUNTER — Other Ambulatory Visit: Payer: Self-pay | Admitting: *Deleted

## 2023-04-17 DIAGNOSIS — D649 Anemia, unspecified: Secondary | ICD-10-CM

## 2023-04-19 ENCOUNTER — Encounter (HOSPITAL_COMMUNITY): Payer: Self-pay

## 2023-04-19 ENCOUNTER — Emergency Department (HOSPITAL_COMMUNITY): Payer: Medicare Other

## 2023-04-19 ENCOUNTER — Emergency Department (HOSPITAL_COMMUNITY)
Admission: EM | Admit: 2023-04-19 | Discharge: 2023-04-19 | Disposition: A | Discharge status: Home / Self Care | Payer: Medicare Other | Attending: Emergency Medicine | Admitting: Emergency Medicine

## 2023-04-19 ENCOUNTER — Other Ambulatory Visit: Payer: Self-pay

## 2023-04-19 DIAGNOSIS — M25551 Pain in right hip: Secondary | ICD-10-CM | POA: Diagnosis not present

## 2023-04-19 DIAGNOSIS — R1032 Left lower quadrant pain: Secondary | ICD-10-CM | POA: Diagnosis not present

## 2023-04-19 DIAGNOSIS — Z7982 Long term (current) use of aspirin: Secondary | ICD-10-CM | POA: Diagnosis not present

## 2023-04-19 DIAGNOSIS — Z96642 Presence of left artificial hip joint: Secondary | ICD-10-CM | POA: Insufficient documentation

## 2023-04-19 DIAGNOSIS — R1031 Right lower quadrant pain: Secondary | ICD-10-CM | POA: Diagnosis not present

## 2023-04-19 DIAGNOSIS — N281 Cyst of kidney, acquired: Secondary | ICD-10-CM | POA: Diagnosis not present

## 2023-04-19 DIAGNOSIS — R109 Unspecified abdominal pain: Secondary | ICD-10-CM

## 2023-04-19 DIAGNOSIS — K5900 Constipation, unspecified: Secondary | ICD-10-CM | POA: Diagnosis not present

## 2023-04-19 LAB — COMPREHENSIVE METABOLIC PANEL
ALT: 8 U/L (ref 0–44)
AST: 11 U/L — ABNORMAL LOW (ref 15–41)
Albumin: 3.3 g/dL — ABNORMAL LOW (ref 3.5–5.0)
Alkaline Phosphatase: 84 U/L (ref 38–126)
Anion gap: 11 (ref 5–15)
BUN: 13 mg/dL (ref 8–23)
CO2: 28 mmol/L (ref 22–32)
Calcium: 9.7 mg/dL (ref 8.9–10.3)
Chloride: 101 mmol/L (ref 98–111)
Creatinine, Ser: 0.93 mg/dL (ref 0.44–1.00)
GFR, Estimated: >60 mL/min (ref >60)
Glucose, Bld: 101 mg/dL — ABNORMAL HIGH (ref 70–99)
Potassium: 3.4 mmol/L — ABNORMAL LOW (ref 3.5–5.1)
Sodium: 140 mmol/L (ref 135–145)
Total Bilirubin: 0.7 mg/dL (ref <1.2)
Total Protein: 6.1 g/dL — ABNORMAL LOW (ref 6.5–8.1)

## 2023-04-19 LAB — I-STAT CHEM 8, ED
BUN: 14 mg/dL (ref 8–23)
Calcium, Ion: 1.24 mmol/L (ref 1.15–1.40)
Chloride: 102 mmol/L (ref 98–111)
Creatinine, Ser: 1 mg/dL (ref 0.44–1.00)
Glucose, Bld: 99 mg/dL (ref 70–99)
HCT: 30 % — ABNORMAL LOW (ref 36.0–46.0)
Hemoglobin: 10.2 g/dL — ABNORMAL LOW (ref 12.0–15.0)
Potassium: 3.5 mmol/L (ref 3.5–5.1)
Sodium: 141 mmol/L (ref 135–145)
TCO2: 28 mmol/L (ref 22–32)

## 2023-04-19 LAB — URINALYSIS, ROUTINE W REFLEX MICROSCOPIC
Bacteria, UA: NONE SEEN
Bilirubin Urine: NEGATIVE
Glucose, UA: NEGATIVE mg/dL
Ketones, ur: NEGATIVE mg/dL
Leukocytes,Ua: NEGATIVE
Nitrite: NEGATIVE
Protein, ur: NEGATIVE mg/dL
Specific Gravity, Urine: 1.032 — ABNORMAL HIGH (ref 1.005–1.030)
pH: 6 (ref 5.0–8.0)

## 2023-04-19 LAB — CBC WITH DIFFERENTIAL/PLATELET
Abs Immature Granulocytes: 0.01 10*3/uL (ref 0.00–0.07)
Basophils Absolute: 0.1 10*3/uL (ref 0.0–0.1)
Basophils Relative: 2 %
Eosinophils Absolute: 0.3 10*3/uL (ref 0.0–0.5)
Eosinophils Relative: 5 %
HCT: 30.3 % — ABNORMAL LOW (ref 36.0–46.0)
Hemoglobin: 9.5 g/dL — ABNORMAL LOW (ref 12.0–15.0)
Immature Granulocytes: 0 %
Lymphocytes Relative: 28 %
Lymphs Abs: 1.4 10*3/uL (ref 0.7–4.0)
MCH: 28.9 pg (ref 26.0–34.0)
MCHC: 31.4 g/dL (ref 30.0–36.0)
MCV: 92.1 fL (ref 80.0–100.0)
Monocytes Absolute: 0.7 10*3/uL (ref 0.1–1.0)
Monocytes Relative: 13 %
Neutro Abs: 2.7 10*3/uL (ref 1.7–7.7)
Neutrophils Relative %: 52 %
Platelets: 265 10*3/uL (ref 150–400)
RBC: 3.29 MIL/uL — ABNORMAL LOW (ref 3.87–5.11)
RDW: 13.5 % (ref 11.5–15.5)
WBC: 5.2 10*3/uL (ref 4.0–10.5)
nRBC: 0 % (ref 0.0–0.2)

## 2023-04-19 LAB — LIPASE, BLOOD: Lipase: 37 U/L (ref 11–51)

## 2023-04-19 MED ORDER — IOHEXOL 300 MG/ML  SOLN
100.0000 mL | Freq: Once | INTRAMUSCULAR | Status: AC | PRN
  Administered 2023-04-19: 100 mL via INTRAVENOUS

## 2023-04-19 MED ORDER — FAMOTIDINE 20 MG PO TABS: 20.0000 mg | ORAL_TABLET | Freq: Two times a day (BID) | ORAL | 0 refills | Status: DC

## 2023-04-19 MED ORDER — POLYETHYLENE GLYCOL 3350 17 GM/SCOOP PO POWD: 17.0000 g | Freq: Every day | ORAL | 0 refills | Status: DC

## 2023-04-19 NOTE — ED Provider Notes (Signed)
[place] EMERGENCY DEPARTMENT AT [facility] Provider Note   CSN: [id number] Arrival date & time: 04/19/23  1253     History {Add pertinent medical, surgical, social history, OB history to HPI:1} Chief Complaint  Patient presents with   Hip Pain    MYNESHA SEAMANS is a 82 y.o. female.  82 year old female with history of chronic constipation, H. pylori status posttreatment, anxiety, and total left hip replacement on February 17, 2023 who presents emergency department with abdominal pain.  Patient initially reported that she think she has a bacterial infection.  Says it is similar to when she had H. pylori in the past.  Says that she is having daily bowel movements but they appear to have seeds in them.  No nausea or vomiting.  No fevers or chills.  Having some mild pain near the site of the hip replacement.  Also having some mild abdominal pain that she has difficulty characterizing.  Told the provider in triage that was in her lower abdomen was telling me it is more epigastrium.  No abdominal surgeries.  Talk to the daughter afterwards and she says that her mother has been overly concerned with this "bacterial infection" ever since the surgery.  Says that she has been having frequent panic attacks as well with 1 last night.       Home Medications Prior to Admission medications   Medication Sig Start Date End Date Taking? Authorizing Provider  amLODipine (NORVASC) 5 MG tablet Take 1 tablet (5 mg total) by mouth daily. Patient taking differently: Take 5 mg by mouth at bedtime. 09/05/22   Daphine Deutscher Mary-Margaret, FNP  aspirin EC 81 MG tablet Take 1 tablet (81 mg total) by mouth 2 (two) times daily. Patient taking differently: Take 81 mg by mouth daily. 01/25/19   Allena Katz, PA-C  aspirin EC 81 MG tablet Take 1 tablet (81 mg total) by mouth 2 (two) times daily. 02/17/23   Allena Katz, PA-C  atorvastatin (LIPITOR) 40 MG tablet TAKE 1 TABLET BY MOUTH DAILY AT 6 PM.  09/05/22   Daphine Deutscher, Mary-Margaret, FNP  benazepril (LOTENSIN) 40 MG tablet Take 1 tablet (40 mg total) by mouth daily. Patient taking differently: Take 40 mg by mouth at bedtime. 09/05/22   Daphine Deutscher, Mary-Margaret, FNP  bisacodyl (DULCOLAX) 5 MG EC tablet Take 5 mg by mouth at bedtime.    [provider]  brimonidine (ALPHAGAN) 0.2 % ophthalmic solution Place 1 drop into both eyes daily. 08/24/21   [provider]  cholecalciferol (VITAMIN D) 1000 UNITS tablet Take 1,000 Units by mouth daily.    [provider]  cyanocobalamin (VITAMIN B12) 1000 MCG tablet Take 1,000 mcg by mouth daily.    [provider]  diclofenac sodium (VOLTAREN) 1 % GEL Apply 2 g topically 4 (four) times daily. Patient taking differently: Apply 2 g topically 4 (four) times daily as needed (pain). 06/30/18   Daphine Deutscher, Mary-Margaret, FNP  escitalopram (LEXAPRO) 20 MG tablet Take 1 tablet (20 mg total) by mouth daily. Patient taking differently: Take 20 mg by mouth at bedtime. 09/05/22   Daphine Deutscher, Mary-Margaret, FNP  hydrochlorothiazide (HYDRODIURIL) 25 MG tablet Take 1 tablet (25 mg total) by mouth daily. 09/05/22   Daphine Deutscher, Mary-Margaret, FNP  HYDROmorphone (DILAUDID) 2 MG tablet Take 1 tablet (2 mg total) by mouth every 6 (six) hours as needed for severe pain (pain score 7-10). 02/17/23   Allena Katz, PA-C  isosorbide mononitrate (IMDUR) 60 MG 24 hr tablet TAKE  1 TABLET BY MOUTH EVERYDAY AT BEDTIME 09/05/22   Daphine Deutscher, Mary-Margaret, FNP  latanoprost (XALATAN) 0.005 % ophthalmic solution Place 1 drop into both eyes at bedtime. 08/24/21   [provider]  metoprolol succinate (TOPROL-XL) 25 MG 24 hr tablet Take 1 tablet (25 mg total) by mouth daily. 09/05/22 09/05/23  Daphine Deutscher, Mary-Margaret, FNP  nitroGLYCERIN (NITROSTAT) 0.4 MG SL tablet Place 1 tablet (0.4 mg total) under the tongue every 5 (five) minutes as needed for chest pain. 09/24/18   Daphine Deutscher, Mary-Margaret, FNP  polyethylene glycol powder  (GLYCOLAX/MIRALAX) 17 GM/SCOOP powder Take 17 g by mouth daily.    [provider]  potassium chloride (KLOR-CON M10) 10 MEQ tablet Take 1 tablet (10 mEq total) by mouth 2 (two) times daily. Patient taking differently: Take 10 mEq by mouth daily. 09/05/22   Daphine Deutscher Mary-Margaret, FNP  Probiotic CHEW Chew 1 capsule by mouth daily.    [provider]  tiZANidine (ZANAFLEX) 2 MG tablet Take 1 tablet (2 mg total) by mouth every 6 (six) hours as needed for muscle spasms. 02/17/23   Allena Katz, PA-C  traMADol (ULTRAM) 50 MG tablet Take 1 tablet (50 mg total) by mouth 2 (two) times daily. 05/16/23 06/15/23  Bennie Pierini, FNP  traMADol (ULTRAM) 50 MG tablet Take 1 tablet (50 mg total) by mouth 2 (two) times daily. 04/16/23 05/16/23  Bennie Pierini, FNP      Allergies    Atarax [hydroxyzine], Bactrim [sulfamethoxazole-trimethoprim], and Coreg [carvedilol]    Review of Systems   Review of Systems  Physical Exam Updated Vital Signs BP 114/77 (BP Location: Right Arm)   Pulse 87   Temp 99.4 F (37.4 C) (Oral)   Resp 16   Ht 5\' 6"  (1.676 m)   Wt 64 kg   SpO2 100%   BMI 22.76 kg/m  Physical Exam Vitals and nursing note reviewed.  Constitutional:      General: She is not in acute distress.    Appearance: She is well-developed.  HENT:     Head: Normocephalic and atraumatic.  Eyes:     Conjunctiva/sclera: Conjunctivae normal.  Cardiovascular:     Rate and Rhythm: Normal rate and regular rhythm.     Heart sounds: No murmur heard. Pulmonary:     Effort: Pulmonary effort is normal. No respiratory distress.     Breath sounds: Normal breath sounds.  Abdominal:     General: There is no distension.     Palpations: Abdomen is soft. There is no mass.     Tenderness: There is no abdominal tenderness. There is no guarding.  Musculoskeletal:        General: No swelling.     Cervical back: Neck supple.     Comments: Scars from hip replacement are clean dry and  intact.  No erythema or drainage.  Full range of motion of the right hip without any pain.  No effusion.  Skin:    General: Skin is warm and dry.     Capillary Refill: Capillary refill takes less than 2 seconds.  Neurological:     Mental Status: She is alert.  Psychiatric:        Mood and Affect: Mood normal.     ED Results / Procedures / Treatments   Labs (all labs ordered are listed, but only abnormal results are displayed) Labs Reviewed  CBC WITH DIFFERENTIAL/PLATELET - Abnormal; Notable for the following components:      Result Value   RBC 3.29 (*)  Hemoglobin 9.5 (*)    HCT 30.3 (*)    All other components within normal limits  COMPREHENSIVE METABOLIC PANEL - Abnormal; Notable for the following components:   Potassium 3.4 (*)    Glucose, Bld 101 (*)    Total Protein 6.1 (*)    Albumin 3.3 (*)    AST 11 (*)    All other components within normal limits  LIPASE, BLOOD  URINALYSIS, ROUTINE W REFLEX MICROSCOPIC  I-STAT CHEM 8, ED    EKG None  Radiology No results found.  Procedures Procedures  {Document cardiac monitor, telemetry assessment procedure when appropriate:1}  Medications Ordered in ED Medications - No data to display  ED Course/ Medical Decision Making/ A&P   {   Click here for ABCD2, HEART and other calculatorsREFRESH Note before signing :1}                              Medical Decision Making  ***  {Document critical care time when appropriate:1} {Document review of labs and clinical decision tools ie heart score, Chads2Vasc2 etc:1}  {Document your independent review of radiology images, and any outside records:1} {Document your discussion with family members, caretakers, and with consultants:1} {Document social determinants of health affecting pt's care:1} {Document your decision making why or why not admission, treatments were needed:1} Final Clinical Impression(s) / ED Diagnoses Final diagnoses:  None    Rx / DC Orders ED Discharge  Orders     None

## 2023-04-19 NOTE — Discharge Instructions (Addendum)
You were seen for your abdominal pain in the emergency department.   At home, please take Miralax for your constipation and pepcid for your abdominal pain.    Check your MyChart online for the results of any tests that had not resulted by the time you left the emergency department.   Follow-up with your primary doctor in 2-3 days regarding your visit.  Follow-up with your gi doctor as well.   Return immediately to the emergency department if you experience any of the following: worsening pain, or any other concerning symptoms.    Thank you for visiting our Emergency Department. It was a pleasure taking care of you today.

## 2023-04-19 NOTE — ED Provider Triage Note (Signed)
Emergency Medicine Provider Triage Evaluation Note  Teresa Holloway , a 82 y.o. female  was evaluated in triage.  Pt complains of hip and abdominal pain.  Patient is status post left total hip arthroplasty with Dr. Turner Daniels in October.  She is reporting that over the past week she has had intermittent pain in the left lower quadrant of her abdomen which is now constant and progressively worsening.  She has had decreased ability to make a bowel movement and poor appetite she denies fevers nausea vomiting diarrhea bloody stools.  She denies any urinary symptoms.  Patient is concerned that she has an infection from her hip that is spread into her bowels.  She is currently also complaining of pain in her left buttock region..  Review of Systems  Positive: Abd/hip pain  Negative: fever  Physical Exam  BP 114/77 (BP Location: Right Arm)   Pulse 87   Temp 99.4 F (37.4 C) (Oral)   Resp 16   Ht 5\' 6"  (1.676 m)   Wt 64 kg   SpO2 100%   BMI 22.76 kg/m  Gen:   Awake, no distress   Resp:  Normal effort  MSK:   Moves extremities without difficulty  Other:  No abd tenderness, pain in the left buttock  Medical Decision Making  Medically screening exam initiated at 1:26 PM.  Appropriate orders placed.  Teresa Holloway was informed that the remainder of the evaluation will be completed by another provider, this initial triage assessment does not replace that evaluation, and the importance of remaining in the ED until their evaluation is complete.    Teresa Captain, PA-C 04/19/23 1332

## 2023-04-19 NOTE — ED Triage Notes (Signed)
Pt arrives with daughter law who reports patient lives alone. Pt had a hip replacement on Oct 28th and thinks she has 'a bacterial infection from the surgery'. Pt c/o pain to both hips and generalized body aches.

## 2023-04-21 ENCOUNTER — Telehealth: Payer: Self-pay

## 2023-04-21 DIAGNOSIS — B9681 Helicobacter pylori [H. pylori] as the cause of diseases classified elsewhere: Secondary | ICD-10-CM | POA: Diagnosis not present

## 2023-04-21 DIAGNOSIS — D649 Anemia, unspecified: Secondary | ICD-10-CM | POA: Diagnosis not present

## 2023-04-21 DIAGNOSIS — D509 Iron deficiency anemia, unspecified: Secondary | ICD-10-CM | POA: Diagnosis not present

## 2023-04-21 DIAGNOSIS — K2970 Gastritis, unspecified, without bleeding: Secondary | ICD-10-CM | POA: Diagnosis not present

## 2023-04-21 NOTE — Telephone Encounter (Signed)
Copied from CRM [id number]. Topic: General - Call Back - No Documentation >> Apr 21, 2023 12:43 PM Fonda Kinder J wrote: Reason for CRM: Pt is requesting a callback [phone]

## 2023-04-21 NOTE — Telephone Encounter (Signed)
 Appointment scheduled for ER follow up

## 2023-04-22 ENCOUNTER — Other Ambulatory Visit: Payer: Self-pay | Admitting: *Deleted

## 2023-04-22 DIAGNOSIS — D649 Anemia, unspecified: Secondary | ICD-10-CM

## 2023-04-23 LAB — FECAL OCCULT BLOOD, IMMUNOCHEMICAL: Fecal Occult Bld: NEGATIVE

## 2023-04-23 LAB — H. PYLORI ANTIGEN, STOOL: H pylori Ag, Stl: NEGATIVE

## 2023-04-24 ENCOUNTER — Ambulatory Visit (INDEPENDENT_AMBULATORY_CARE_PROVIDER_SITE_OTHER): Payer: Medicare Other | Admitting: Nurse Practitioner

## 2023-04-24 ENCOUNTER — Encounter: Payer: Self-pay | Admitting: Nurse Practitioner

## 2023-04-24 VITALS — BP 135/71 | HR 72 | Temp 97.7°F | Ht 66.0 in | Wt 138.0 lb

## 2023-04-24 DIAGNOSIS — R195 Other fecal abnormalities: Secondary | ICD-10-CM

## 2023-04-24 DIAGNOSIS — Z09 Encounter for follow-up examination after completed treatment for conditions other than malignant neoplasm: Secondary | ICD-10-CM

## 2023-04-24 NOTE — Patient Instructions (Signed)
 Fall Prevention in the Home, Adult Falls can cause injuries and can happen to people of all ages. There are many things you can do to make your home safer and to help prevent falls. What actions can I take to prevent falls? General information Use good lighting in all rooms. Make sure to: Replace any light bulbs that burn out. Turn on the lights in dark areas and use night-lights. Keep items that you use often in easy-to-reach places. Lower the shelves around your home if needed. Move furniture so that there are clear paths around it. Do not use throw rugs or other things on the floor that can make you trip. If any of your floors are uneven, fix them. Add color or contrast paint or tape to clearly mark and help you see: Grab bars or handrails. First and last steps of staircases. Where the edge of each step is. If you use a ladder or stepladder: Make sure that it is fully opened. Do not climb a closed ladder. Make sure the sides of the ladder are locked in place. Have someone hold the ladder while you use it. Know where your pets are as you move through your home. What can I do in the bathroom?     Keep the floor dry. Clean up any water on the floor right away. Remove soap buildup in the bathtub or shower. Buildup makes bathtubs and showers slippery. Use non-skid mats or decals on the floor of the bathtub or shower. Attach bath mats securely with double-sided, non-slip rug tape. If you need to sit down in the shower, use a non-slip stool. Install grab bars by the toilet and in the bathtub and shower. Do not use towel bars as grab bars. What can I do in the bedroom? Make sure that you have a light by your bed that is easy to reach. Do not use any sheets or blankets on your bed that hang to the floor. Have a firm chair or bench with side arms that you can use for support when you get dressed. What can I do in the kitchen? Clean up any spills right away. If you need to reach something  above you, use a step stool with a grab bar. Keep electrical cords out of the way. Do not use floor polish or wax that makes floors slippery. What can I do with my stairs? Do not leave anything on the stairs. Make sure that you have a light switch at the top and the bottom of the stairs. Make sure that there are handrails on both sides of the stairs. Fix handrails that are broken or loose. Install non-slip stair treads on all your stairs if they do not have carpet. Avoid having throw rugs at the top or bottom of the stairs. Choose a carpet that does not hide the edge of the steps on the stairs. Make sure that the carpet is firmly attached to the stairs. Fix carpet that is loose or worn. What can I do on the outside of my home? Use bright outdoor lighting. Fix the edges of walkways and driveways and fix any cracks. Clear paths of anything that can make you trip, such as tools or rocks. Add color or contrast paint or tape to clearly mark and help you see anything that might make you trip as you walk through a door, such as a raised step or threshold. Trim any bushes or trees on paths to your home. Check to see if handrails are loose  or broken and that both sides of all steps have handrails. Install guardrails along the edges of any raised decks and porches. Have leaves, snow, or ice cleared regularly. Use sand, salt, or ice melter on paths if you live where there is ice and snow during the winter. Clean up any spills in your garage right away. This includes grease or oil spills. What other actions can I take? Review your medicines with your doctor. Some medicines can cause dizziness or changes in blood pressure, which increase your risk of falling. Wear shoes that: Have a low heel. Do not wear high heels. Have rubber bottoms and are closed at the toe. Feel good on your feet and fit well. Use tools that help you move around if needed. These include: Canes. Walkers. Scooters. Crutches. Ask  your doctor what else you can do to help prevent falls. This may include seeing a physical therapist to learn to do exercises to move better and get stronger. Where to find more information Centers for Disease Control and Prevention, [employer]: TonerPromos.no [employer] on Aging: BaseRingTones.pl National Institute on Aging: BaseRingTones.pl Contact a doctor if: You are afraid of falling at home. You feel weak, drowsy, or dizzy at home. You fall at home. Get help right away if you: Lose consciousness or have trouble moving after a fall. Have a fall that causes a head injury. These symptoms may be an emergency. Get help right away. Call 911. Do not wait to see if the symptoms will go away. Do not drive yourself to the hospital. This information is not intended to replace advice given to you by your health care provider. Make sure you discuss any questions you have with your health care provider. Document Revised: 12/10/2021 Document Reviewed: 12/10/2021 Elsevier Patient Education  2024 [employer].

## 2023-04-24 NOTE — Progress Notes (Signed)
 Subjective:    Patient ID: Teresa Holloway, female    DOB: 28-Jun-1940, 83 y.o.   MRN: [id number]   Chief Complaint: Hospitalization Follow-up   HPI  Patient went to ED on 04/19/23 with hip pain and panic attacks: - hip pain- she had a total hip replacement about 4 weeks ago and has had pain since then. She says the pain is below where they did her hip surgery. She thinks she has an infection.   - she has seen GI and they have ordered stool cultures but results are not back yet. But results in computer say that she has no bacteria or problems in her stool. She denies diarrhea or constipation.   Patient Active Problem List   Diagnosis Date Noted   S/P total right hip arthroplasty 02/17/2023   Osteoarthritis of right hip 02/14/2023   Hypokalemia 09/05/2022   Anemia 02/05/2022   Constipation 10/29/2021   Helicobacter pylori gastritis 10/29/2021   Status post total left knee replacement 01/25/2019   Pain management contract agreement 02/22/2016   Opioid type dependence, continuous (HCC) 02/22/2016   BMI 30.0-30.9,adult 01/02/2015   Osteoarthritis of left hip 08/23/2013   Depression 08/23/2013   Osteoporosis 06/23/2013   Glaucoma of right eye 06/23/2013   CAD (coronary artery disease) 08/19/2012   History of MI (myocardial infarction) 08/19/2012   Arthritis of both knees 08/19/2012   Essential hypertension, benign 08/10/2012   Hyperlipidemia with target LDL less than 100 08/10/2012   GERD (gastroesophageal reflux disease) 08/10/2012       Review of Systems  Constitutional:  Negative for diaphoresis.  Eyes:  Negative for pain.  Respiratory:  Negative for shortness of breath.   Cardiovascular:  Negative for chest pain, palpitations and leg swelling.  Gastrointestinal:  Negative for abdominal pain.  Endocrine: Negative for polydipsia.  Skin:  Negative for rash.  Neurological:  Negative for dizziness, weakness and headaches.  Hematological:  Does not bruise/bleed easily.   All other systems reviewed and are negative.      Objective:   Physical Exam Constitutional:      Appearance: Normal appearance.  Cardiovascular:     Rate and Rhythm: Normal rate and regular rhythm.     Heart sounds: Normal heart sounds.  Pulmonary:     Effort: Pulmonary effort is normal.     Breath sounds: Normal breath sounds.  Skin:    General: Skin is warm.  Neurological:     General: No focal deficit present.     Mental Status: She is alert and oriented to person, place, and time.  Psychiatric:        Mood and Affect: Mood normal.        Behavior: Behavior normal.     BP 135/71   Pulse 72   Temp 97.7 F (36.5 C) (Temporal)   Ht 5' 6 (1.676 m)   Wt 138 lb (62.6 kg)   SpO2 93%   BMI 22.27 kg/m        Assessment & Plan:   Teresa Holloway in today with chief complaint of Hospitalization Follow-up   1. Change in consistency of stool (Primary) Follow up with GI Results  of stool specimen discussed with patient Bland diet  2. Hospital discharge follow-up Hospital records reviewed    The above assessment and management plan was discussed with the patient. The patient verbalized understanding of and has agreed to the management plan. Patient is aware to call the clinic if symptoms persist or worsen. Patient  is aware when to return to the clinic for a follow-up visit. Patient educated on when it is appropriate to go to the emergency department.   Mary-Margaret Gladis, FNP

## 2023-04-25 ENCOUNTER — Other Ambulatory Visit: Payer: Self-pay | Admitting: *Deleted

## 2023-04-25 DIAGNOSIS — D509 Iron deficiency anemia, unspecified: Secondary | ICD-10-CM

## 2023-04-25 DIAGNOSIS — D649 Anemia, unspecified: Secondary | ICD-10-CM

## 2023-05-01 ENCOUNTER — Ambulatory Visit: Payer: Medicare Other | Attending: Orthopedic Surgery

## 2023-05-01 DIAGNOSIS — M6281 Muscle weakness (generalized): Secondary | ICD-10-CM | POA: Diagnosis not present

## 2023-05-01 DIAGNOSIS — M25552 Pain in left hip: Secondary | ICD-10-CM | POA: Insufficient documentation

## 2023-05-01 NOTE — Therapy (Signed)
 OUTPATIENT PHYSICAL THERAPY LOWER EXTREMITY TREATMENT   Patient Name: Teresa Holloway MRN: [id number] DOB:January 15, 1941, 83 y.o., female Today's Date: 05/01/2023  END OF SESSION:  PT End of Session - 05/01/23 1357     Visit Number 8    Number of Visits 12    Date for PT Re-Evaluation 05/02/23    Authorization Type FOTO.    PT Start Time 1345    PT Stop Time 1430    PT Time Calculation (min) 45 min    Activity Tolerance Patient tolerated treatment well    Behavior During Therapy WFL for tasks assessed/performed                 Past Medical History:  Diagnosis Date   Anxiety    Arthritis    Knees, hips   CAD (coronary artery disease)    Chronic back pain    Depression    GERD (gastroesophageal reflux disease)    Hyperlipidemia    Hypertension    MI (myocardial infarction) (HCC)    Osteopenia    Past Surgical History:  Procedure Laterality Date   BIOPSY  07/16/2021   Procedure: BIOPSY;  Surgeon: Cindie Carlin POUR, DO;  Location: [facility];  Service: Endoscopy;;   COLONOSCOPY WITH PROPOFOL  N/A 07/16/2021   Procedure: COLONOSCOPY WITH PROPOFOL ;  Surgeon: Cindie Carlin POUR, DO;  Location: [facility];  Service: Endoscopy;  Laterality: N/A;  10:30am   ECTOPIC PREGNANCY SURGERY     ESOPHAGOGASTRODUODENOSCOPY (EGD) WITH PROPOFOL  N/A 07/16/2021   Procedure: ESOPHAGOGASTRODUODENOSCOPY (EGD) WITH PROPOFOL ;  Surgeon: Cindie Carlin POUR, DO;  Location: [facility];  Service: Endoscopy;  Laterality: N/A;   left breast biospy     left wrist fracture     TOTAL HIP ARTHROPLASTY Right 02/17/2023   Procedure: RIGHT TOTAL HIP ARTHROPLASTY ANTERIOR APPROACH;  Surgeon: Liam Lerner, MD;  Location: [facility];  Service: Orthopedics;  Laterality: Right;   TOTAL KNEE ARTHROPLASTY Left 01/25/2019   Procedure: LEFT TOTAL KNEE ARTHROPLASTY;  Surgeon: Liam Lerner, MD;  Location: [facility];  Service: Orthopedics;  Laterality: Left;   Patient Active Problem List   Diagnosis Date Noted   S/P  total right hip arthroplasty 02/17/2023   Osteoarthritis of right hip 02/14/2023   Hypokalemia 09/05/2022   Anemia 02/05/2022   Constipation 10/29/2021   Helicobacter pylori gastritis 10/29/2021   Status post total left knee replacement 01/25/2019   Pain management contract agreement 02/22/2016   Opioid type dependence, continuous (HCC) 02/22/2016   BMI 30.0-30.9,adult 01/02/2015   Osteoarthritis of left hip 08/23/2013   Depression 08/23/2013   Osteoporosis 06/23/2013   Glaucoma of right eye 06/23/2013   CAD (coronary artery disease) 08/19/2012   History of MI (myocardial infarction) 08/19/2012   Arthritis of both knees 08/19/2012   Essential hypertension, benign 08/10/2012   Hyperlipidemia with target LDL less than 100 08/10/2012   GERD (gastroesophageal reflux disease) 08/10/2012    REFERRING PROVIDER: Lerner Liam MD  REFERRING DIAG: Rt anterior total hip arthroplasty  THERAPY DIAG:  Pain in left hip  Muscle weakness (generalized)  Rationale for Evaluation and Treatment: Rehabilitation  ONSET DATE: Surgery date (02/17/23).  SUBJECTIVE:   SUBJECTIVE STATEMENT: Patient reports that she is hurting a little bit today and her hip has been clicking. However, this is not painful and she talked to her surgeon about it and he is not concerned about it. She is scheduled to see her surgeon in about a week. She notes that she is able to  do everything at this time. She was able to put up her Christmas tree.   PERTINENT HISTORY: Left TKA. PAIN:  Are you having pain? Yes: NPRS scale: no pain score provided/10 Pain location: Right hip. Pain description: Ache. Aggravating factors: Increased activity. Relieving factors: Rest.  PRECAUTIONS: Anterior hip.  No ultrasound.   WEIGHT BEARING RESTRICTIONS: No  FALLS:  Has patient fallen in last 6 months? No  LIVING ENVIRONMENT: Lives in: House/apartment Stairs:  external. Use railing.  Non-reciprocating fashion. Has following  equipment at home: Vannie - 2 wheeled   PLOF: Independent with basic ADLs and Independent with household mobility with device  PATIENT GOALS: Move better without right hip pain.   OBJECTIVE:  Note: Objective measures were completed at Evaluation unless otherwise noted.  PATIENT SURVEYS:  FOTO 50.16 on 03/27/23  OBSERVATION:  Right hip incisional site appears to be healing well.  POSTURE:  Equal leg lengths.  PALPATION: Mild tenderness on either side of incisional site.  LOWER EXTREMITY MMT:  Normal right ankle strength.  Patient easily able to perform a right SAQ and perform hip abduction in supine.  TRANSFERS:    Patient transferred from sit to supine and supine to sit with supervision only.   GAIT: The patient is walking with a FWW with essentially normal gait pattern.  TODAY'S TREATMENT:                                   05/01/23 EXERCISE LOG  Exercise Repetitions and Resistance Comments  Nustep  L4 x 20 minutes    LAQ 5# x 3 minutes Alternating LE  Seated marching   5# x 3 minutes            Blank cell = exercise not performed today                                    03/31/23 EXERCISE LOG  Exercise Repetitions and Resistance Comments  Nustep  L4 x 19 minutes    Marching on foam  3 minutes   Step up  6 step x 2 minutes  Alternating LE  Side stepping on foam  3 minutes  BUE support  Rocker board  5 minutes   Sit to stand 20 reps  From standard height chair with UE support   Blank cell = exercise not performed today                                    03/27/23 EXERCISE LOG  Exercise Repetitions and Resistance Comments  Nustep  L4 x 16 minutes   LAQ 5# x 3 minutes  Alternating LE   Seated marching   20 reps each    Sit to stand  10 reps  Without UE support from elevated seat   Seated hip ADD isometric  3 minutes w/ 5 seconds   Step up  6 step x 12 reps  Leading with RLE   Rocker board  4.5 minutes     Blank cell = exercise not performed today   PATIENT  EDUCATION:  Education details: progress with therapy Person educated: Patient Education method: Medical Illustrator Education comprehension: verbalized understanding and returned demonstration  HOME EXERCISE PROGRAM:   ASSESSMENT:  CLINICAL IMPRESSION: Patient has met all of  her functional goals for skilled physical therapy at this time. The only goal she was unable to meet was her independence with her HEP as she reported that she is not doing her home exercise program since her last appointment on 03/31/23. She also reported being able to do all of her household activities without pain or limitation. She was also able to navigate four steps with a reciprocal pattern with one railing. Today's interventions focused on familiar interventions for improved lower extremity strength and muscular endurance. She reported feeling tired upon the conclusion of treatment. Recommend that she be discharged from skilled physical therapy with her home exercise program.  OBJECTIVE IMPAIRMENTS: decreased activity tolerance and pain.   ACTIVITY LIMITATIONS: standing and stairs  PARTICIPATION LIMITATIONS: laundry and community activity  PERSONAL FACTORS: Time since onset of injury/illness/exacerbation are also affecting patient's functional outcome.   REHAB POTENTIAL: Excellent  CLINICAL DECISION MAKING: Stable/uncomplicated  EVALUATION COMPLEXITY: Low   GOALS:  LONG TERM GOALS: Target date: 04/11/23  Ind with a HEP.  Patient reports that she is not doing her HEP at home since her last appointment.  Goal status: NOT MET  2.  Patient walk a community distance with a pain-level of not > 3/10.   Patient reports that her hip is not really hurting anymore Goal status: MET  3.  Perform a reciprocating stair gait with one railing.   Reciprocal pattern with 1 railing Goal status: MET  4.  Perform ADL's with pain not > 3/10.   Patient reports that her hip is not really hurting Goal  status: MET  PLAN:  PT FREQUENCY: 2x/week  PT DURATION: 6 weeks  PLANNED INTERVENTIONS: 97110-Therapeutic exercises, 97530- Therapeutic activity, 97112- Neuromuscular re-education, 97535- Self Care, [id number]- Manual therapy, [id number]- Gait training, 97014- Electrical stimulation (unattended), Patient/Family education, Stair training, Cryotherapy, and Moist heat  PLAN FOR NEXT SESSION: Nustep, Gait activities, LE therex.     Lacinda JAYSON Fass, PT 05/01/2023, 2:56 PM

## 2023-05-02 ENCOUNTER — Emergency Department (HOSPITAL_COMMUNITY): Payer: Medicare Other

## 2023-05-02 ENCOUNTER — Other Ambulatory Visit: Payer: Self-pay

## 2023-05-02 ENCOUNTER — Emergency Department (HOSPITAL_COMMUNITY)
Admission: EM | Admit: 2023-05-02 | Discharge: 2023-05-02 | Disposition: A | Discharge status: Home / Self Care | Payer: Medicare Other | Attending: Emergency Medicine | Admitting: Emergency Medicine

## 2023-05-02 ENCOUNTER — Encounter (HOSPITAL_COMMUNITY): Payer: Self-pay

## 2023-05-02 DIAGNOSIS — T84029A Dislocation of unspecified internal joint prosthesis, initial encounter: Secondary | ICD-10-CM

## 2023-05-02 DIAGNOSIS — Z7982 Long term (current) use of aspirin: Secondary | ICD-10-CM | POA: Diagnosis not present

## 2023-05-02 DIAGNOSIS — W19XXXA Unspecified fall, initial encounter: Secondary | ICD-10-CM | POA: Insufficient documentation

## 2023-05-02 DIAGNOSIS — Z4789 Encounter for other orthopedic aftercare: Secondary | ICD-10-CM | POA: Diagnosis not present

## 2023-05-02 DIAGNOSIS — Y828 Other medical devices associated with adverse incidents: Secondary | ICD-10-CM | POA: Diagnosis not present

## 2023-05-02 DIAGNOSIS — S79911A Unspecified injury of right hip, initial encounter: Secondary | ICD-10-CM | POA: Diagnosis present

## 2023-05-02 DIAGNOSIS — I10 Essential (primary) hypertension: Secondary | ICD-10-CM | POA: Diagnosis not present

## 2023-05-02 DIAGNOSIS — Z743 Need for continuous supervision: Secondary | ICD-10-CM | POA: Diagnosis not present

## 2023-05-02 DIAGNOSIS — S73004A Unspecified dislocation of right hip, initial encounter: Secondary | ICD-10-CM | POA: Diagnosis not present

## 2023-05-02 DIAGNOSIS — Z96641 Presence of right artificial hip joint: Secondary | ICD-10-CM | POA: Insufficient documentation

## 2023-05-02 DIAGNOSIS — T84020A Dislocation of internal right hip prosthesis, initial encounter: Secondary | ICD-10-CM | POA: Insufficient documentation

## 2023-05-02 DIAGNOSIS — Z79899 Other long term (current) drug therapy: Secondary | ICD-10-CM | POA: Insufficient documentation

## 2023-05-02 DIAGNOSIS — R6889 Other general symptoms and signs: Secondary | ICD-10-CM | POA: Diagnosis not present

## 2023-05-02 LAB — MAGNESIUM: Magnesium: 1.8 mg/dL (ref 1.7–2.4)

## 2023-05-02 LAB — CBC WITH DIFFERENTIAL/PLATELET
Abs Immature Granulocytes: 0.02 10*3/uL (ref 0.00–0.07)
Basophils Absolute: 0.1 10*3/uL (ref 0.0–0.1)
Basophils Relative: 1 %
Eosinophils Absolute: 0.2 10*3/uL (ref 0.0–0.5)
Eosinophils Relative: 3 %
HCT: 32.7 % — ABNORMAL LOW (ref 36.0–46.0)
Hemoglobin: 10.3 g/dL — ABNORMAL LOW (ref 12.0–15.0)
Immature Granulocytes: 0 %
Lymphocytes Relative: 14 %
Lymphs Abs: 1 10*3/uL (ref 0.7–4.0)
MCH: 28.7 pg (ref 26.0–34.0)
MCHC: 31.5 g/dL (ref 30.0–36.0)
MCV: 91.1 fL (ref 80.0–100.0)
Monocytes Absolute: 0.5 10*3/uL (ref 0.1–1.0)
Monocytes Relative: 8 %
Neutro Abs: 5.2 10*3/uL (ref 1.7–7.7)
Neutrophils Relative %: 74 %
Platelets: 243 10*3/uL (ref 150–400)
RBC: 3.59 MIL/uL — ABNORMAL LOW (ref 3.87–5.11)
RDW: 13.8 % (ref 11.5–15.5)
WBC: 7 10*3/uL (ref 4.0–10.5)
nRBC: 0 % (ref 0.0–0.2)

## 2023-05-02 LAB — BASIC METABOLIC PANEL
Anion gap: 9 (ref 5–15)
BUN: 18 mg/dL (ref 8–23)
CO2: 29 mmol/L (ref 22–32)
Calcium: 9.8 mg/dL (ref 8.9–10.3)
Chloride: 99 mmol/L (ref 98–111)
Creatinine, Ser: 0.9 mg/dL (ref 0.44–1.00)
GFR, Estimated: >60 mL/min (ref >60)
Glucose, Bld: 101 mg/dL — ABNORMAL HIGH (ref 70–99)
Potassium: 3.4 mmol/L — ABNORMAL LOW (ref 3.5–5.1)
Sodium: 137 mmol/L (ref 135–145)

## 2023-05-02 MED ORDER — TRAMADOL HCL 50 MG PO TABS
50.0000 mg | ORAL_TABLET | Freq: Once | ORAL | Status: AC
Start: 2023-05-02 — End: 2023-05-02
  Administered 2023-05-02: 50 mg via ORAL
  Filled 2023-05-02: qty 1

## 2023-05-02 MED ORDER — ACETAMINOPHEN 325 MG PO TABS
650.0000 mg | ORAL_TABLET | Freq: Once | ORAL | Status: AC
  Administered 2023-05-02: 650 mg via ORAL
  Filled 2023-05-02: qty 2

## 2023-05-02 MED ORDER — ONDANSETRON HCL 4 MG/2ML IJ SOLN
4.0000 mg | Freq: Once | INTRAMUSCULAR | Status: AC
  Administered 2023-05-02: 4 mg via INTRAVENOUS
  Filled 2023-05-02: qty 2

## 2023-05-02 MED ORDER — PROPOFOL 10 MG/ML IV BOLUS
1.0000 mg/kg | Freq: Once | INTRAVENOUS | Status: AC
  Administered 2023-05-02: 60 mg via INTRAVENOUS
  Filled 2023-05-02: qty 20

## 2023-05-02 MED ORDER — TRAMADOL HCL 50 MG PO TABS: 50.0000 mg | ORAL_TABLET | Freq: Four times a day (QID) | ORAL | 0 refills | Status: DC | PRN

## 2023-05-02 MED ORDER — FENTANYL CITRATE PF 50 MCG/ML IJ SOSY
50.0000 ug | PREFILLED_SYRINGE | Freq: Once | INTRAMUSCULAR | Status: AC
Start: 2023-05-02 — End: 2023-05-02
  Administered 2023-05-02: 50 ug via INTRAVENOUS
  Filled 2023-05-02: qty 1

## 2023-05-02 NOTE — Discharge Instructions (Signed)
 Please follow closely with your orthopedist.  Call first thing on Monday or when you get home today.  You will need to be seen in his office in follow-up.  Please keep your knee immobilizer on at all times except when bathing.  Use a walker or crutches at all times to support yourself as you will not be able to bend your knee.  Get help right away if your hip dislocates again prior to seeing your orthopedist.  You may take Tylenol  and I have ordered tramadol  for pain relief.

## 2023-05-02 NOTE — ED Triage Notes (Signed)
 Pt here due to fall resulting in R hip dislocation. Pt had R hip replacement 2 weeks ago and stated that her hip has been clicking since after her surgery. Pt stated that she was combing her hair and twisted her hips and she felt her hip dislocate and then fell. Pt denies hitting her head. Significant shortening of R leg

## 2023-05-02 NOTE — ED Provider Notes (Signed)
 [place] EMERGENCY DEPARTMENT AT [facility] Provider Note   CSN: [id number] Arrival date & time: 05/02/23  1203     History  Chief Complaint  Patient presents with   Hip Pain    Teresa Holloway is a 83 y.o. female who presents emergency department with chief complaint of severe hip pain.  She status post total hip arthroplasty on the right in October 2024.  Patient states that she was in the bathroom combing her hair.  She turned to leave the bathroom and felt her hip completely give out.  She states that she was unable to over the right hip joint at all.  She did not hit her head or lose consciousness and denies any other injuries.  Patient states she was able to grab a towel and use the towel to hold her foot so she could scoot out of the bathroom to get to her phone.   Hip Pain       Home Medications Prior to Admission medications   Medication Sig Start Date End Date Taking? Authorizing Provider  amLODipine  (NORVASC ) 5 MG tablet Take 1 tablet (5 mg total) by mouth daily. 09/05/22   Gladis Mary-Margaret, FNP  aspirin  EC 81 MG tablet Take 1 tablet (81 mg total) by mouth 2 (two) times daily. Patient taking differently: Take 81 mg by mouth daily. 01/25/19   Orlando Camellia POUR, PA-C  aspirin  EC 81 MG tablet Take 1 tablet (81 mg total) by mouth 2 (two) times daily. 02/17/23   Orlando Camellia POUR, PA-C  atorvastatin  (LIPITOR) 40 MG tablet TAKE 1 TABLET BY MOUTH DAILY AT 6 PM. 09/05/22   Gladis Mary-Margaret, FNP  benazepril  (LOTENSIN ) 40 MG tablet Take 1 tablet (40 mg total) by mouth daily. Patient taking differently: Take 40 mg by mouth at bedtime. 09/05/22   Gladis, Mary-Margaret, FNP  bisacodyl  (DULCOLAX) 5 MG EC tablet Take 5 mg by mouth at bedtime.    [provider]  brimonidine  (ALPHAGAN ) 0.2 % ophthalmic solution Place 1 drop into both eyes daily. 08/24/21   [provider]  cholecalciferol  (VITAMIN D ) 1000 UNITS tablet Take 1,000 Units by mouth daily.     [provider]  cyanocobalamin  (VITAMIN B12) 1000 MCG tablet Take 1,000 mcg by mouth daily.    [provider]  diclofenac  sodium (VOLTAREN ) 1 % GEL Apply 2 g topically 4 (four) times daily. Patient taking differently: Apply 2 g topically 4 (four) times daily as needed (pain). 06/30/18   Gladis, Mary-Margaret, FNP  escitalopram  (LEXAPRO ) 20 MG tablet Take 1 tablet (20 mg total) by mouth daily. 09/05/22   Gladis Mary-Margaret, FNP  famotidine  (PEPCID ) 20 MG tablet Take 1 tablet (20 mg total) by mouth 2 (two) times daily. 04/19/23   Yolande Lamar BROCKS, MD  hydrochlorothiazide  (HYDRODIURIL ) 25 MG tablet Take 1 tablet (25 mg total) by mouth daily. 09/05/22   Gladis Mary-Margaret, FNP  HYDROmorphone  (DILAUDID ) 2 MG tablet Take 1 tablet (2 mg total) by mouth every 6 (six) hours as needed for severe pain (pain score 7-10). 02/17/23   Orlando Camellia POUR, PA-C  isosorbide  mononitrate (IMDUR ) 60 MG 24 hr tablet TAKE 1 TABLET BY MOUTH EVERYDAY AT BEDTIME 09/05/22   Gladis, Mary-Margaret, FNP  latanoprost  (XALATAN ) 0.005 % ophthalmic solution Place 1 drop into both eyes at bedtime. 08/24/21   [provider]  metoprolol  succinate (TOPROL -XL) 25 MG 24 hr tablet Take 1 tablet (25 mg total) by mouth daily. 09/05/22 09/05/23  Gladis Mustard, FNP  nitroGLYCERIN  (NITROSTAT ) 0.4 MG SL tablet Place 1 tablet (0.4 mg total) under the tongue every 5 (five) minutes as needed for chest pain. 09/24/18   Gladis, Mary-Margaret, FNP  polyethylene glycol powder (GLYCOLAX /MIRALAX ) 17 GM/SCOOP powder Take 17 g by mouth daily. 04/19/23   Yolande Lamar BROCKS, MD  potassium chloride  (KLOR-CON  M10) 10 MEQ tablet Take 1 tablet (10 mEq total) by mouth 2 (two) times daily. Patient taking differently: Take 10 mEq by mouth daily. 09/05/22   Gladis Mary-Margaret, FNP  Probiotic CHEW Chew 1 capsule by mouth daily.    [provider]  tiZANidine  (ZANAFLEX ) 2 MG tablet Take 1 tablet (2 mg total) by mouth  every 6 (six) hours as needed for muscle spasms. 02/17/23   Orlando Camellia POUR, PA-C  traMADol  (ULTRAM ) 50 MG tablet Take 1 tablet (50 mg total) by mouth 2 (two) times daily. 05/16/23 06/15/23  Gladis Mustard, FNP  traMADol  (ULTRAM ) 50 MG tablet Take 1 tablet (50 mg total) by mouth 2 (two) times daily. 04/16/23 05/16/23  Gladis Mustard, FNP      Allergies    Atarax [hydroxyzine], Bactrim  [sulfamethoxazole -trimethoprim ], and Coreg [carvedilol]    Review of Systems   Review of Systems  Physical Exam Updated Vital Signs BP 125/72   Pulse 75   Temp 97.6 F (36.4 C) (Oral)   Resp 12   Ht 5' 6 (1.676 m)   Wt 62.5 kg   SpO2 95%   BMI 22.24 kg/m  Physical Exam Vitals and nursing note reviewed.  Constitutional:      General: She is not in acute distress.    Appearance: She is well-developed. She is not diaphoretic.     Comments: Patient appears extremely uncomfortable  HENT:     Head: Normocephalic and atraumatic.     Right Ear: External ear normal.     Left Ear: External ear normal.     Nose: Nose normal.     Mouth/Throat:     Mouth: Mucous membranes are moist.  Eyes:     General: No scleral icterus.    Conjunctiva/sclera: Conjunctivae normal.  Cardiovascular:     Rate and Rhythm: Normal rate and regular rhythm.     Heart sounds: Normal heart sounds. No murmur heard.    No friction rub. No gallop.  Pulmonary:     Effort: Pulmonary effort is normal. No respiratory distress.     Breath sounds: Normal breath sounds.  Abdominal:     General: Bowel sounds are normal. There is no distension.     Palpations: Abdomen is soft. There is no mass.     Tenderness: There is no abdominal tenderness. There is no guarding.  Musculoskeletal:     Cervical back: Normal range of motion.     Comments: The right leg appears approximately 6 inches shorter on the right than the left.  Able to wiggle toes, no significant rotation, DP and PT pulse 2+.  Skin:    General: Skin is warm and  dry.  Neurological:     Mental Status: She is alert and oriented to person, place, and time.  Psychiatric:        Behavior: Behavior normal.     ED Results / Procedures / Treatments   Labs (all labs ordered are listed, but only abnormal results are displayed) Labs Reviewed  CBC WITH DIFFERENTIAL/PLATELET - Abnormal; Notable for the following components:      Result Value   RBC 3.59 (*)    Hemoglobin 10.3 (*)  HCT 32.7 (*)    All other components within normal limits  BASIC METABOLIC PANEL - Abnormal; Notable for the following components:   Potassium 3.4 (*)    Glucose, Bld 101 (*)    All other components within normal limits  MAGNESIUM    EKG None  Radiology DG Hip Unilat  With Pelvis 2-3 Views Right Result Date: 05/02/2023 CLINICAL DATA:  Fall. EXAM: DG HIP (WITH OR WITHOUT PELVIS) 2-3V RIGHT COMPARISON:  CT scan abdomen and pelvis from 04/19/2023. FINDINGS: There is right hip joint dislocation with lateral and superior migration of femoral head prosthesis in relation to the acetabular prosthesis. No discrete fracture seen. No aggressive osseous lesion. Visualized sacral arcuate lines are unremarkable. Unremarkable symphysis pubis. There are mild degenerative changes of the left hip joint with mild joint space narrowing and osteophytosis of the superior acetabulum. No radiopaque foreign bodies. IMPRESSION: *Right hip arthroplasty hardware dislocation. *No acute fracture. Electronically Signed   By: Ree Molt M.D.   On: 05/02/2023 13:01    Procedures .Reduction of dislocation  Date/Time: 05/02/2023 2:29 PM  Performed by: Arloa Chroman, PA-C Authorized by: Arloa Chroman, PA-C  Consent: Verbal consent obtained. Written consent obtained. Risks and benefits: risks, benefits and alternatives were discussed Consent given by: patient Patient identity confirmed: verbally with patient Time out: Immediately prior to procedure a time out was called to verify the correct  patient, procedure, equipment, support staff and site/side marked as required (1405).  Sedation: Patient sedated: yes (see note by Dr. Suzette)  Patient tolerance: patient tolerated the procedure well with no immediate complications   .Splint Application  Date/Time: 05/02/2023 2:31 PM  Performed by: Arloa Chroman, PA-C Authorized by: Arloa Chroman, PA-C   Consent:    Consent obtained:  Verbal   Consent given by:  Patient   Risks discussed:  Discoloration   Alternatives discussed:  No treatment Universal protocol:    Patient identity confirmed:  Verbally with patient Pre-procedure details:    Distal neurologic exam:  Normal   Distal perfusion: distal pulses strong   Procedure details:    Location:  Knee   Knee location:  L knee   Splint type:  Knee immobilizer   Attestation: Splint applied and adjusted personally by me   Post-procedure details:    Distal neurologic exam:  Normal   Distal perfusion: distal pulses strong     Procedure completion:  Tolerated well, no immediate complications     Medications Ordered in ED Medications  propofol  (DIPRIVAN ) 10 mg/mL bolus/IV push 60 mg (has no administration in time range)  fentaNYL  (SUBLIMAZE ) injection 50 mcg (50 mcg Intravenous Given 05/02/23 1331)  ondansetron  (ZOFRAN ) injection 4 mg (4 mg Intravenous Given 05/02/23 1331)    ED Course/ Medical Decision Making/ A&P Clinical Course as of 05/02/23 1429  Fri May 02, 2023  1356 Patient on cardiac monitoring.  Her nurse Chroman reports that she appears to be in atrial fibrillation.  Will obtain additional labs and get an EKG.  He has no history of atrial fibrillation. [AH]  1428 Magnesium [AH]  1428 Basic metabolic panel(!) [AH]  1428 CBC with Differential(!) [AH]  1428 DG Hip Unilat  With Pelvis 2-3 Views Right Initial x-ray shows superior dislocation of the right hip. [AH]  1429 DG Hip Unilat W or Wo Pelvis 2-3 Views Right Bedside wet read of the postreduction x-ray shows  relocation of prosthetic right hip arthroplasty. [AH]    Clinical Course User Index [AH] Xareni Kelch, PA-C  Medical Decision Making This is an 83 year old female who presents to the emergency department chief complaint of right hip injury and pain.  Differential diagnosis includes dislocation, fracture, radiculopathy. Triage x-ray showed a right hip prosthesis dislocation. I ordered labs and reviewed.  There are no significant findings on her lab report.  I ordered and reviewed an EKG which shows sinus rhythm with atrial premature complexes and couplets, no evidence of atrial fibrillation.    In shared visit with Dr. Larnell Sermon performed sedation and I was able to reduce the dislocation of her right hip.  I reviewed all findings with the patient and her daughter at bedside.  She is still complaining of pain in the hip.  She has tramadol  which she just filled at home.  Patient will also take Tylenol .  She she has a walker at her house.  She is advised not to remove her knee immobilizer except for when bathing until she follows up with Dr. Liam.  Patient's daughter states that she is also, contacted their office about today's emergency room visit.  She will need to follow closely with him.  Discussed outpatient follow-up and return precautions.  Amount and/or Complexity of Data Reviewed Labs: ordered. Decision-making details documented in ED Course. Radiology: ordered and independent interpretation performed. Decision-making details documented in ED Course. ECG/medicine tests: ordered and independent interpretation performed.  Risk OTC drugs. Prescription drug management.           Final Clinical Impression(s) / ED Diagnoses Final diagnoses:  Dislocation of hip joint prosthesis, initial encounter [facility])    Rx / DC Orders ED Discharge Orders     None         Arloa Chroman, PA-C 05/02/23 1615    Sermon Pac, MD 05/05/23  1019

## 2023-05-05 NOTE — ED Provider Notes (Signed)
.  Sedation  Date/Time: 05/05/2023 10:15 AM  Performed by: Suzette Pac, MD Authorized by: Suzette Pac, MD   Consent:    Consent obtained:  Verbal   Consent given by:  Patient   Risks discussed:  Prolonged hypoxia resulting in organ damage Universal protocol:    Immediately prior to procedure, a time out was called: yes     Patient identity confirmed:  Verbally with patient and arm band Pre-sedation assessment:    Time since last food or drink:  4 hours   ASA classification: class 2 - patient with mild systemic disease     Mouth opening:  3 or more finger widths   Mallampati score:  II - soft palate, uvula, fauces visible   Neck mobility: normal     Pre-sedation assessments completed and reviewed: airway patency and cardiovascular function   A pre-sedation assessment was completed prior to the start of the procedure Procedure details (see MAR for exact dosages):    Sedation:  Propofol    Intended level of sedation: deep   Total Provider sedation time (minutes):  30 Post-procedure details:   A post-sedation assessment was completed following the completion of the procedure.   Patient is stable for discharge or admission: yes     Procedure completion:  Tolerated well, no immediate complications Comments:     Patient was sedated with approximately 1 mg/kg of propofol .  Patient was in significant sedation and her right hip was reduced without problems     Suzette Pac, MD 05/05/23 1018

## 2023-05-06 DIAGNOSIS — M25551 Pain in right hip: Secondary | ICD-10-CM | POA: Diagnosis not present

## 2023-05-08 ENCOUNTER — Telehealth: Payer: Self-pay

## 2023-05-08 NOTE — Progress Notes (Signed)
Transition Care Management Follow-up Telephone Call Date of discharge and from where: 05/02/2023 [facility] How have you been since you were released from the hospital? Patient stated she is feeling better and her pain had decreased. Any questions or concerns? No  Items Reviewed: Did the pt receive and understand the discharge instructions provided? Yes  Medications obtained and verified? Yes  Other? No  Any new allergies since your discharge? No  Dietary orders reviewed? Yes Do you have support at home? Yes   Follow up appointments reviewed:  PCP Hospital f/u appt confirmed? No  Scheduled to see  on  @ . Specialist Hospital f/u appt confirmed? No  Scheduled to see  on  @ . Are transportation arrangements needed? No  If their condition worsens, is the pt aware to call PCP or go to the Emergency Dept.? Yes Was the patient provided with contact information for the PCP's office or ED? Yes Was to pt encouraged to call back with questions or concerns? Yes   Bayler Nehring [facility]  [facility] Guide Direct Dial: [phone]  Fax: [phone] Website: [email or web address]

## 2023-05-09 ENCOUNTER — Telehealth: Payer: Self-pay

## 2023-05-09 NOTE — Progress Notes (Signed)
Transition Care Management Unsuccessful Follow-up Telephone Call  Date of discharge and from where:  05/02/2023 [facility]  Attempts:  2nd Attempt  Reason for unsuccessful TCM follow-up call:  No answer/busy unable to leave message.  Kirtan Sada [facility]  [facility] Guide Direct Dial: [phone]  Fax: [phone] Website: [email or web address]

## 2023-05-16 ENCOUNTER — Ambulatory Visit: Payer: Medicare Other | Admitting: Nurse Practitioner

## 2023-05-22 ENCOUNTER — Other Ambulatory Visit: Payer: Self-pay | Admitting: *Deleted

## 2023-05-22 ENCOUNTER — Ambulatory Visit: Payer: Medicare Other

## 2023-05-22 DIAGNOSIS — M6281 Muscle weakness (generalized): Secondary | ICD-10-CM

## 2023-05-22 DIAGNOSIS — D509 Iron deficiency anemia, unspecified: Secondary | ICD-10-CM

## 2023-05-22 DIAGNOSIS — D649 Anemia, unspecified: Secondary | ICD-10-CM

## 2023-05-22 DIAGNOSIS — M25552 Pain in left hip: Secondary | ICD-10-CM

## 2023-05-22 NOTE — Therapy (Signed)
OUTPATIENT PHYSICAL THERAPY LOWER EXTREMITY TREATMENT   Patient Name: Teresa Holloway MRN: [id number] DOB:03-Jun-1940, 83 y.o., female Today's Date: 05/22/2023  END OF SESSION:  PT End of Session - 05/22/23 1629     Visit Number 9    Number of Visits 12    Date for PT Re-Evaluation 06/20/23    Authorization Type FOTO.    PT Start Time 1627    PT Stop Time 1708    PT Time Calculation (min) 41 min    Activity Tolerance Patient tolerated treatment well    Behavior During Therapy WFL for tasks assessed/performed                  Past Medical History:  Diagnosis Date   Anxiety    Arthritis    Knees, hips   CAD (coronary artery disease)    Chronic back pain    Depression    GERD (gastroesophageal reflux disease)    Hyperlipidemia    Hypertension    MI (myocardial infarction) (HCC)    Osteopenia    Past Surgical History:  Procedure Laterality Date   BIOPSY  07/16/2021   Procedure: BIOPSY;  Surgeon: Lanelle Bal, DO;  Location: [facility];  Service: Endoscopy;;   COLONOSCOPY WITH PROPOFOL N/A 07/16/2021   Procedure: COLONOSCOPY WITH PROPOFOL;  Surgeon: Lanelle Bal, DO;  Location: [facility];  Service: Endoscopy;  Laterality: N/A;  10:30am   ECTOPIC PREGNANCY SURGERY     ESOPHAGOGASTRODUODENOSCOPY (EGD) WITH PROPOFOL N/A 07/16/2021   Procedure: ESOPHAGOGASTRODUODENOSCOPY (EGD) WITH PROPOFOL;  Surgeon: Lanelle Bal, DO;  Location: [facility];  Service: Endoscopy;  Laterality: N/A;   left breast biospy     left wrist fracture     TOTAL HIP ARTHROPLASTY Right 02/17/2023   Procedure: RIGHT TOTAL HIP ARTHROPLASTY ANTERIOR APPROACH;  Surgeon: Gean Birchwood, MD;  Location: [facility];  Service: Orthopedics;  Laterality: Right;   TOTAL KNEE ARTHROPLASTY Left 01/25/2019   Procedure: LEFT TOTAL KNEE ARTHROPLASTY;  Surgeon: Gean Birchwood, MD;  Location: [facility];  Service: Orthopedics;  Laterality: Left;   Patient Active Problem List   Diagnosis Date Noted    S/P total right hip arthroplasty 02/17/2023   Osteoarthritis of right hip 02/14/2023   Hypokalemia 09/05/2022   Anemia 02/05/2022   Constipation 10/29/2021   Helicobacter pylori gastritis 10/29/2021   Status post total left knee replacement 01/25/2019   Pain management contract agreement 02/22/2016   Opioid type dependence, continuous (HCC) 02/22/2016   BMI 30.0-30.9,adult 01/02/2015   Osteoarthritis of left hip 08/23/2013   Depression 08/23/2013   Osteoporosis 06/23/2013   Glaucoma of right eye 06/23/2013   CAD (coronary artery disease) 08/19/2012   History of MI (myocardial infarction) 08/19/2012   Arthritis of both knees 08/19/2012   Essential hypertension, benign 08/10/2012   Hyperlipidemia with target LDL less than 100 08/10/2012   GERD (gastroesophageal reflux disease) 08/10/2012    REFERRING PROVIDER: Gean Birchwood MD  REFERRING DIAG: Rt anterior total hip arthroplasty  THERAPY DIAG:  Pain in left hip  Muscle weakness (generalized)  Rationale for Evaluation and Treatment: Rehabilitation  ONSET DATE: Surgery date (02/17/23).  SUBJECTIVE:   SUBJECTIVE STATEMENT: Patient reports that her hip is not hurting any. She feels that it is getting better. She still notices that her hip is clicking.   PERTINENT HISTORY: Left TKA. PAIN:  Are you having pain? Yes: NPRS scale: no pain score provided/10 Pain location: Right hip. Pain description: Ache. Aggravating factors: Increased  activity. Relieving factors: Rest.  PRECAUTIONS: Anterior hip.  No ultrasound.   WEIGHT BEARING RESTRICTIONS: No  FALLS:  Has patient fallen in last 6 months? No  LIVING ENVIRONMENT: Lives in: House/apartment Stairs:  external. Use railing.  Non-reciprocating fashion. Has following equipment at home: Dan Humphreys - 2 wheeled   PLOF: Independent with basic ADLs and Independent with household mobility with device  PATIENT GOALS: Move better without right hip pain.   OBJECTIVE:  Note:  Objective measures were completed at Evaluation unless otherwise noted.  PATIENT SURVEYS:  FOTO 50.16 on 03/27/23  OBSERVATION:  Right hip incisional site appears to be healing well.  POSTURE:  Equal leg lengths.  PALPATION: Mild tenderness on either side of incisional site.  LOWER EXTREMITY ROM:     Active  Right eval Left eval  Hip flexion 97 100  Hip extension    Hip abduction 12 17  Hip adduction    Hip internal rotation    Hip external rotation    Knee flexion    Knee extension    Ankle dorsiflexion    Ankle plantarflexion    Ankle inversion    Ankle eversion     (Blank rows = not tested)   LOWER EXTREMITY MMT:    MMT Right eval Left eval  Hip flexion 4-/5 3+/5  Hip extension    Hip abduction    Hip adduction 3+/5 3+/5  Hip internal rotation    Hip external rotation    Knee flexion    Knee extension 4+/5 5/5  Ankle dorsiflexion    Ankle plantarflexion    Ankle inversion    Ankle eversion     (Blank rows = not tested)   TRANSFERS:    Patient transferred from sit to supine and supine to sit with supervision only.   GAIT: The patient is walking with a FWW with essentially normal gait pattern.  TODAY'S TREATMENT:                                   05/22/23 EXERCISE LOG  Exercise Repetitions and Resistance Comments  Nustep  L4 x 18 minutes   SLR  15 reps    Supine hip ADD isometric 3 minutes w/ 5 second hold    Blank cell = exercise not performed today                                    05/01/23 EXERCISE LOG  Exercise Repetitions and Resistance Comments  Nustep  L4 x 20 minutes    LAQ 5# x 3 minutes Alternating LE  Seated marching   5# x 3 minutes            Blank cell = exercise not performed today                                    03/31/23 EXERCISE LOG  Exercise Repetitions and Resistance Comments  Nustep  L4 x 19 minutes    Marching on foam  3 minutes   Step up  6" step x 2 minutes  Alternating LE  Side stepping on foam  3 minutes   BUE support  Rocker board  5 minutes   Sit to stand 20 reps  From standard height chair with UE support  Blank cell = exercise not performed today   PATIENT EDUCATION:  Education details: progress with therapy Person educated: Patient Education method: Medical illustrator Education comprehension: verbalized understanding and returned demonstration  HOME EXERCISE PROGRAM:   ASSESSMENT:  CLINICAL IMPRESSION: Patient is making good progress with skilled physical therapy as evidenced by her subjective reports, objective measures, functional mobility, and progress toward her goals. She was able to meet most of her initial goals for skilled physical therapy. However, she continues to exhibit reduced hip flexor and adductor strength bilaterally. Today's interventions focused on these identified impairments. She required minimal cueing with today's interventions for proper exercise performance. She experienced a mild increase in right hip discomfort with straight leg raises, but this did not limit her ability to complete this or any of today's other interventions. She reported feeling good upon the conclusion of treatment. Recommend that she continue with her current plan of care to address her remaining impairments to return to her prior level of function.   OBJECTIVE IMPAIRMENTS: decreased activity tolerance and pain.   ACTIVITY LIMITATIONS: standing and stairs  PARTICIPATION LIMITATIONS: laundry and community activity  PERSONAL FACTORS: Time since onset of injury/illness/exacerbation are also affecting patient's functional outcome.   REHAB POTENTIAL: Excellent  CLINICAL DECISION MAKING: Stable/uncomplicated  EVALUATION COMPLEXITY: Low   GOALS:  LONG TERM GOALS: Target date: 04/11/23  Ind with a HEP.  Patient reports that she is not doing her HEP at home since her last appointment.  Goal status: NOT MET  2.  Patient walk a community distance with a pain-level of not >  3/10.   Patient reports that her hip is not really hurting anymore Goal status: MET  3.  Perform a reciprocating stair gait with one railing.   Reciprocal pattern with 1 railing Goal status: MET  4.  Perform ADL's with pain not > 3/10.   Patient reports that her hip is not really hurting Goal status: MET  5.  Patient will be able to demonstrate proper lifting mechanics for improved function with her household activities.   Baseline: increased trunk and hip flexion with minimal knee flexion Goal status: INITIAL   6.  Patient will be able to transfer from sitting to standing without UE support.  Baseline:  Goal status: INITIAL   PLAN:  PT FREQUENCY: 2x/week  PT DURATION: 6 weeks  PLANNED INTERVENTIONS: 97110-Therapeutic exercises, 97530- Therapeutic activity, [id number]- Neuromuscular re-education, 97535- Self Care, 16109- Manual therapy, [phone]- Gait training, 97014- Electrical stimulation (unattended), Patient/Family education, Stair training, Cryotherapy, and Moist heat  PLAN FOR NEXT SESSION: Nustep, Gait activities, LE therex.     Granville Lewis, PT 05/22/2023, 5:50 PM

## 2023-05-27 ENCOUNTER — Emergency Department (HOSPITAL_COMMUNITY): Payer: Medicare Other

## 2023-05-27 ENCOUNTER — Emergency Department (HOSPITAL_COMMUNITY)
Admission: EM | Admit: 2023-05-27 | Discharge: 2023-05-27 | Disposition: A | Discharge status: Home / Self Care | Payer: Medicare Other | Attending: Emergency Medicine | Admitting: Emergency Medicine

## 2023-05-27 ENCOUNTER — Other Ambulatory Visit: Payer: Self-pay

## 2023-05-27 ENCOUNTER — Encounter (HOSPITAL_COMMUNITY): Payer: Self-pay

## 2023-05-27 DIAGNOSIS — Z96641 Presence of right artificial hip joint: Secondary | ICD-10-CM | POA: Diagnosis not present

## 2023-05-27 DIAGNOSIS — W1830XA Fall on same level, unspecified, initial encounter: Secondary | ICD-10-CM | POA: Diagnosis not present

## 2023-05-27 DIAGNOSIS — T84020A Dislocation of internal right hip prosthesis, initial encounter: Secondary | ICD-10-CM | POA: Insufficient documentation

## 2023-05-27 DIAGNOSIS — S3993XA Unspecified injury of pelvis, initial encounter: Secondary | ICD-10-CM | POA: Diagnosis not present

## 2023-05-27 DIAGNOSIS — Z7982 Long term (current) use of aspirin: Secondary | ICD-10-CM | POA: Insufficient documentation

## 2023-05-27 DIAGNOSIS — R6889 Other general symptoms and signs: Secondary | ICD-10-CM | POA: Diagnosis not present

## 2023-05-27 DIAGNOSIS — M1711 Unilateral primary osteoarthritis, right knee: Secondary | ICD-10-CM | POA: Diagnosis not present

## 2023-05-27 DIAGNOSIS — S79921A Unspecified injury of right thigh, initial encounter: Secondary | ICD-10-CM | POA: Diagnosis not present

## 2023-05-27 DIAGNOSIS — X58XXXA Exposure to other specified factors, initial encounter: Secondary | ICD-10-CM | POA: Insufficient documentation

## 2023-05-27 DIAGNOSIS — W19XXXA Unspecified fall, initial encounter: Secondary | ICD-10-CM | POA: Diagnosis not present

## 2023-05-27 DIAGNOSIS — S79911A Unspecified injury of right hip, initial encounter: Secondary | ICD-10-CM | POA: Diagnosis not present

## 2023-05-27 DIAGNOSIS — Z743 Need for continuous supervision: Secondary | ICD-10-CM | POA: Diagnosis not present

## 2023-05-27 DIAGNOSIS — S73004A Unspecified dislocation of right hip, initial encounter: Secondary | ICD-10-CM | POA: Insufficient documentation

## 2023-05-27 DIAGNOSIS — S73006A Unspecified dislocation of unspecified hip, initial encounter: Secondary | ICD-10-CM | POA: Diagnosis not present

## 2023-05-27 DIAGNOSIS — I10 Essential (primary) hypertension: Secondary | ICD-10-CM | POA: Diagnosis not present

## 2023-05-27 DIAGNOSIS — M25551 Pain in right hip: Secondary | ICD-10-CM | POA: Diagnosis present

## 2023-05-27 LAB — BASIC METABOLIC PANEL
Anion gap: 8 (ref 5–15)
BUN: 17 mg/dL (ref 8–23)
CO2: 29 mmol/L (ref 22–32)
Calcium: 9.5 mg/dL (ref 8.9–10.3)
Chloride: 101 mmol/L (ref 98–111)
Creatinine, Ser: 0.92 mg/dL (ref 0.44–1.00)
GFR, Estimated: >60 mL/min (ref >60)
Glucose, Bld: 102 mg/dL — ABNORMAL HIGH (ref 70–99)
Potassium: 3.7 mmol/L (ref 3.5–5.1)
Sodium: 138 mmol/L (ref 135–145)

## 2023-05-27 LAB — CBC WITH DIFFERENTIAL/PLATELET
Abs Immature Granulocytes: 0.01 10*3/uL (ref 0.00–0.07)
Basophils Absolute: 0.1 10*3/uL (ref 0.0–0.1)
Basophils Relative: 1 %
Eosinophils Absolute: 0.2 10*3/uL (ref 0.0–0.5)
Eosinophils Relative: 3 %
HCT: 32.5 % — ABNORMAL LOW (ref 36.0–46.0)
Hemoglobin: 10.2 g/dL — ABNORMAL LOW (ref 12.0–15.0)
Immature Granulocytes: 0 %
Lymphocytes Relative: 19 %
Lymphs Abs: 1 10*3/uL (ref 0.7–4.0)
MCH: 29 pg (ref 26.0–34.0)
MCHC: 31.4 g/dL (ref 30.0–36.0)
MCV: 92.3 fL (ref 80.0–100.0)
Monocytes Absolute: 0.5 10*3/uL (ref 0.1–1.0)
Monocytes Relative: 9 %
Neutro Abs: 3.5 10*3/uL (ref 1.7–7.7)
Neutrophils Relative %: 68 %
Platelets: 219 10*3/uL (ref 150–400)
RBC: 3.52 MIL/uL — ABNORMAL LOW (ref 3.87–5.11)
RDW: 16.8 % — ABNORMAL HIGH (ref 11.5–15.5)
WBC: 5.3 10*3/uL (ref 4.0–10.5)
nRBC: 0 % (ref 0.0–0.2)

## 2023-05-27 LAB — PROTIME-INR
INR: 1.1 (ref 0.8–1.2)
Prothrombin Time: 13.9 s (ref 11.4–15.2)

## 2023-05-27 LAB — TYPE AND SCREEN
ABO/RH(D): O POS
Antibody Screen: NEGATIVE

## 2023-05-27 MED ORDER — MIDAZOLAM HCL 2 MG/2ML IJ SOLN
2.0000 mg | Freq: Once | INTRAMUSCULAR | Status: AC
  Administered 2023-05-27: 2 mg via INTRAVENOUS
  Filled 2023-05-27: qty 2

## 2023-05-27 MED ORDER — FENTANYL CITRATE PF 50 MCG/ML IJ SOSY
50.0000 ug | PREFILLED_SYRINGE | Freq: Once | INTRAMUSCULAR | Status: AC
Start: 2023-05-27 — End: 2023-05-27
  Administered 2023-05-27: 50 ug via INTRAVENOUS
  Filled 2023-05-27: qty 1

## 2023-05-27 MED ORDER — FENTANYL CITRATE PF 50 MCG/ML IJ SOSY: 50.0000 ug | PREFILLED_SYRINGE | INTRAMUSCULAR | Status: DC | PRN

## 2023-05-27 MED ORDER — PROPOFOL 10 MG/ML IV BOLUS
0.5000 mg/kg | Freq: Once | INTRAVENOUS | Status: AC
  Administered 2023-05-27: 31.3 mg via INTRAVENOUS
  Filled 2023-05-27: qty 20

## 2023-05-27 NOTE — ED Provider Notes (Signed)
 [place] EMERGENCY DEPARTMENT AT [facility] Provider Note   CSN: [id number] Arrival date & time: 05/27/23  1439     History  Chief Complaint  Patient presents with   [place]    Teresa Holloway is a 83 y.o. female.   Fall   82 year old female presenting after having her hip replaced in October, this is the second time she has had a hip problem stating that it spontaneously dislocated while she was walking in the kitchen causing her to fall to the ground.  This occurred just prior to arrival, she denies any head injury, denies chest pain or shortness of breath.  She has pain from her knee to her hip on the right side.  Review of the medical record shows that she had her initial operation in October, she was operated on by Dr. Dempsey Sensor on October 28.    Home Medications Prior to Admission medications   Medication Sig Start Date End Date Taking? Authorizing Provider  amLODipine  (NORVASC ) 5 MG tablet Take 1 tablet (5 mg total) by mouth daily. 09/05/22   Gladis Mary-Margaret, FNP  aspirin  EC 81 MG tablet Take 1 tablet (81 mg total) by mouth 2 (two) times daily. Patient taking differently: Take 81 mg by mouth daily. 01/25/19   Orlando Camellia POUR, PA-C  aspirin  EC 81 MG tablet Take 1 tablet (81 mg total) by mouth 2 (two) times daily. 02/17/23   Orlando Camellia POUR, PA-C  atorvastatin  (LIPITOR) 40 MG tablet TAKE 1 TABLET BY MOUTH DAILY AT 6 PM. 09/05/22   Gladis, Mary-Margaret, FNP  benazepril  (LOTENSIN ) 40 MG tablet Take 1 tablet (40 mg total) by mouth daily. Patient taking differently: Take 40 mg by mouth at bedtime. 09/05/22   Gladis, Mary-Margaret, FNP  bisacodyl  (DULCOLAX) 5 MG EC tablet Take 5 mg by mouth at bedtime.    [provider]  brimonidine  (ALPHAGAN ) 0.2 % ophthalmic solution Place 1 drop into both eyes daily. 08/24/21   [provider]  cholecalciferol  (VITAMIN D ) 1000 UNITS tablet Take 1,000 Units by mouth daily.    [provider]   cyanocobalamin  (VITAMIN B12) 1000 MCG tablet Take 1,000 mcg by mouth daily.    [provider]  diclofenac  sodium (VOLTAREN ) 1 % GEL Apply 2 g topically 4 (four) times daily. Patient taking differently: Apply 2 g topically 4 (four) times daily as needed (pain). 06/30/18   Gladis, Mary-Margaret, FNP  escitalopram  (LEXAPRO ) 20 MG tablet Take 1 tablet (20 mg total) by mouth daily. 09/05/22   Gladis Mary-Margaret, FNP  famotidine  (PEPCID ) 20 MG tablet Take 1 tablet (20 mg total) by mouth 2 (two) times daily. 04/19/23   Yolande Lamar BROCKS, MD  hydrochlorothiazide  (HYDRODIURIL ) 25 MG tablet Take 1 tablet (25 mg total) by mouth daily. 09/05/22   Gladis, Mary-Margaret, FNP  HYDROmorphone  (DILAUDID ) 2 MG tablet Take 1 tablet (2 mg total) by mouth every 6 (six) hours as needed for severe pain (pain score 7-10). 02/17/23   Orlando Camellia POUR, PA-C  isosorbide  mononitrate (IMDUR ) 60 MG 24 hr tablet TAKE 1 TABLET BY MOUTH EVERYDAY AT BEDTIME 09/05/22   Gladis, Mary-Margaret, FNP  latanoprost  (XALATAN ) 0.005 % ophthalmic solution Place 1 drop into both eyes at bedtime. 08/24/21   [provider]  metoprolol  succinate (TOPROL -XL) 25 MG 24 hr tablet Take 1 tablet (25 mg total) by mouth daily. 09/05/22 09/05/23  Gladis Mary-Margaret, FNP  nitroGLYCERIN  (NITROSTAT ) 0.4 MG SL tablet Place 1 tablet (0.4 mg total) under the  tongue every 5 (five) minutes as needed for chest pain. 09/24/18   Gladis, Mary-Margaret, FNP  polyethylene glycol powder (GLYCOLAX /MIRALAX ) 17 GM/SCOOP powder Take 17 g by mouth daily. 04/19/23   Yolande Lamar BROCKS, MD  potassium chloride  (KLOR-CON  M10) 10 MEQ tablet Take 1 tablet (10 mEq total) by mouth 2 (two) times daily. Patient taking differently: Take 10 mEq by mouth daily. 09/05/22   Gladis Mary-Margaret, FNP  Probiotic CHEW Chew 1 capsule by mouth daily.    [provider]  tiZANidine  (ZANAFLEX ) 2 MG tablet Take 1 tablet (2 mg total) by mouth every 6 (six) hours as needed  for muscle spasms. 02/17/23   Orlando Camellia POUR, PA-C  traMADol  (ULTRAM ) 50 MG tablet Take 1 tablet (50 mg total) by mouth 2 (two) times daily. 05/16/23 06/15/23  Gladis Mustard, FNP  traMADol  (ULTRAM ) 50 MG tablet Take 1 tablet (50 mg total) by mouth every 6 (six) hours as needed. 05/02/23   Harris, Abigail, PA-C      Allergies    Atarax [hydroxyzine], Bactrim  [sulfamethoxazole -trimethoprim ], and Coreg [carvedilol]    Review of Systems   Review of Systems  All other systems reviewed and are negative.   Physical Exam Updated Vital Signs BP 120/63   Pulse 73   Temp 98.2 F (36.8 C) (Oral)   Resp 15   Ht 1.676 m (5' 6)   Wt 62.5 kg   SpO2 99%   BMI 22.24 kg/m  Physical Exam Vitals and nursing note reviewed.  Constitutional:      General: She is not in acute distress.    Appearance: She is well-developed.  HENT:     Head: Normocephalic and atraumatic.     Mouth/Throat:     Pharynx: No oropharyngeal exudate.  Eyes:     General: No scleral icterus.       Right eye: No discharge.        Left eye: No discharge.     Conjunctiva/sclera: Conjunctivae normal.     Pupils: Pupils are equal, round, and reactive to light.  Neck:     Thyroid : No thyromegaly.     Vascular: No JVD.  Cardiovascular:     Rate and Rhythm: Normal rate and regular rhythm.     Heart sounds: Normal heart sounds. No murmur heard.    No friction rub. No gallop.  Pulmonary:     Effort: Pulmonary effort is normal. No respiratory distress.     Breath sounds: Normal breath sounds. No wheezing or rales.  Abdominal:     General: Bowel sounds are normal. There is no distension.     Palpations: Abdomen is soft. There is no mass.     Tenderness: There is no abdominal tenderness.  Musculoskeletal:        General: Tenderness present.     Cervical back: Normal range of motion and neck supple.     Right lower leg: No edema.     Left lower leg: No edema.     Comments: No edema of the legs, the right leg is  shortened and rotated, tenderness palpated in the mid to proximal thigh, good pulses at the foot  Lymphadenopathy:     Cervical: No cervical adenopathy.  Skin:    General: Skin is warm and dry.     Findings: No erythema or rash.  Neurological:     Mental Status: She is alert.     Coordination: Coordination normal.  Psychiatric:        Behavior: Behavior normal.  ED Results / Procedures / Treatments   Labs (all labs ordered are listed, but only abnormal results are displayed) Labs Reviewed  BASIC METABOLIC PANEL - Abnormal; Notable for the following components:      Result Value   Glucose, Bld 102 (*)    All other components within normal limits  CBC WITH DIFFERENTIAL/PLATELET - Abnormal; Notable for the following components:   RBC 3.52 (*)    Hemoglobin 10.2 (*)    HCT 32.5 (*)    RDW 16.8 (*)    All other components within normal limits  PROTIME-INR  TYPE AND SCREEN    EKG EKG Interpretation Date/Time:  Tuesday May 27 2023 15:56:51 EST Ventricular Rate:  68 PR Interval:  185 QRS Duration:  120 QT Interval:  470 QTC Calculation: 500 R Axis:   23  Text Interpretation: Sinus rhythm Nonspecific intraventricular conduction delay Borderline T abnormalities, anterior leads Confirmed by Cleotilde Rogue ([id number]) on 05/27/2023 4:32:29 PM  Radiology DG FEMUR, MIN 2 VIEWS RIGHT Result Date: 05/27/2023 CLINICAL DATA:  Trauma and right hip pain. EXAM: PELVIS - 1-2 VIEW; RIGHT FEMUR 2 VIEWS; RIGHT KNEE - COMPLETE 4+ VIEW COMPARISON:  Right hip radiograph dated 05/02/2023. FINDINGS: There is a total right hip arthroplasty. There is superior dislocation of the femoral component of the arthroplasty in relation to the acetabular cup. There is no acute fracture. Severe arthritic changes of the right knee. The soft tissues are unremarkable. IMPRESSION: Superior dislocation of the femoral component of the right hip arthroplasty. Electronically Signed   By: Vanetta Chou M.D.   On:  05/27/2023 16:27   DG Knee Complete 4 Views Right Result Date: 05/27/2023 CLINICAL DATA:  Trauma and right hip pain. EXAM: PELVIS - 1-2 VIEW; RIGHT FEMUR 2 VIEWS; RIGHT KNEE - COMPLETE 4+ VIEW COMPARISON:  Right hip radiograph dated 05/02/2023. FINDINGS: There is a total right hip arthroplasty. There is superior dislocation of the femoral component of the arthroplasty in relation to the acetabular cup. There is no acute fracture. Severe arthritic changes of the right knee. The soft tissues are unremarkable. IMPRESSION: Superior dislocation of the femoral component of the right hip arthroplasty. Electronically Signed   By: Vanetta Chou M.D.   On: 05/27/2023 16:27   DG Pelvis 1-2 Views Result Date: 05/27/2023 CLINICAL DATA:  Trauma and right hip pain. EXAM: PELVIS - 1-2 VIEW; RIGHT FEMUR 2 VIEWS; RIGHT KNEE - COMPLETE 4+ VIEW COMPARISON:  Right hip radiograph dated 05/02/2023. FINDINGS: There is a total right hip arthroplasty. There is superior dislocation of the femoral component of the arthroplasty in relation to the acetabular cup. There is no acute fracture. Severe arthritic changes of the right knee. The soft tissues are unremarkable. IMPRESSION: Superior dislocation of the femoral component of the right hip arthroplasty. Electronically Signed   By: Vanetta Chou M.D.   On: 05/27/2023 16:27    Procedures .Reduction of dislocation  Date/Time: 05/27/2023 5:19 PM  Performed by: Cleotilde Rogue, MD Authorized by: Cleotilde Rogue, MD  Consent: Verbal consent obtained. Written consent obtained. Risks and benefits: risks, benefits and alternatives were discussed Consent given by: patient Patient understanding: patient states understanding of the procedure being performed Site marked: the operative site was marked Imaging studies: imaging studies available Required items: required blood products, implants, devices, and special equipment available Patient identity confirmed: verbally with  patient Time out: Immediately prior to procedure a time out was called to verify the correct patient, procedure, equipment, support staff and site/side marked as required.  Local anesthesia used: no  Anesthesia: Local anesthesia used: no  Sedation: Patient sedated: yes Sedation type: moderate (conscious) sedation Sedation start date/time: 05/27/2023 4:59 PM Sedation end date/time: 05/27/2023 5:20 PM  Patient tolerance: patient tolerated the procedure well with no immediate complications Comments:   Imaging confirms reduction   .Sedation  Date/Time: 05/27/2023 5:20 PM  Performed by: Cleotilde Rogue, MD Authorized by: Cleotilde Rogue, MD   Consent:    Consent obtained:  Written   Consent given by:  Patient   Risks discussed:  Allergic reaction, nausea, vomiting, inadequate sedation, respiratory compromise necessitating ventilatory assistance and intubation, dysrhythmia, prolonged sedation necessitating reversal and prolonged hypoxia resulting in organ damage   Alternatives discussed:  Analgesia without sedation Universal protocol:    Procedure explained and questions answered to patient or proxy's satisfaction: yes     Immediately prior to procedure, a time out was called: yes     Patient identity confirmed:  Verbally with patient Indications:    Procedure performed:  Dislocation reduction   Procedure necessitating sedation performed by:  Physician performing sedation Pre-sedation assessment:    Time since last food or drink:  3   ASA classification: class 2 - patient with mild systemic disease     Mallampati score:  II - soft palate, uvula, fauces visible   Neck mobility: normal     Pre-sedation assessments completed and reviewed: airway patency, cardiovascular function, hydration status, mental status, nausea/vomiting, pain level, respiratory function and temperature   A pre-sedation assessment was completed prior to the start of the procedure Immediate pre-procedure details:     Reassessment: Patient reassessed immediately prior to procedure     Reviewed: vital signs, relevant labs/tests and NPO status     Verified: bag valve mask available, emergency equipment available, intubation equipment available, IV patency confirmed, oxygen available, reversal medications available and suction available   Procedure details (see MAR for exact dosages):    Preoxygenation:  Nasal cannula   Sedation:  Propofol  and midazolam    Intended level of sedation: deep   Analgesia:  Fentanyl    Intra-procedure monitoring:  Continuous capnometry, continuous pulse oximetry, frequent LOC assessments, frequent vital sign checks, blood pressure monitoring and cardiac monitor   Intra-procedure events: none     Total Provider sedation time (minutes):  21 Post-procedure details:   A post-sedation assessment was completed following the completion of the procedure.   Attendance: Constant attendance by certified staff until patient recovered     Recovery: Patient returned to pre-procedure baseline     Post-sedation assessments completed and reviewed: airway patency, cardiovascular function, hydration status, mental status, nausea/vomiting, pain level, respiratory function and temperature     Patient is stable for discharge or admission: yes     Procedure completion:  Tolerated well, no immediate complications Comments:           Medications Ordered in ED Medications  fentaNYL  (SUBLIMAZE ) injection 50 mcg (has no administration in time range)  propofol  (DIPRIVAN ) 10 mg/mL bolus/IV push 31.3 mg (31.3 mg Intravenous Given by Other 05/27/23 1707)  fentaNYL  (SUBLIMAZE ) injection 50 mcg (50 mcg Intravenous Given by Other 05/27/23 1659)  midazolam  (VERSED ) injection 2 mg (2 mg Intravenous Given by Other 05/27/23 1704)    ED Course/ Medical Decision Making/ A&P                                 Medical Decision Making Amount and/or Complexity of Data Reviewed  Labs: ordered. Radiology:  ordered.  Risk Prescription drug management.    This patient presents to the ED for concern of hip pain likely redislocation of the artificial hip, this involves an extensive number of treatment options, and is a complaint that carries with it a high risk of complications and morbidity.  The differential diagnosis includes fracture, dislocation   Co morbidities that complicate the patient evaluation  Elderly, not anticoagulated   Additional history obtained:  Additional history obtained from medical record External records from outside source obtained and reviewed including operative notes   Lab Tests:  I Ordered, and personally interpreted labs.  The pertinent results include: CBC and metabolic panel unremarkable   Imaging Studies ordered:  I ordered imaging studies including x-ray of the right hip shows dislocation I independently visualized and interpreted imaging which showed dislocation of right hip I agree with the radiologist interpretation   Cardiac Monitoring: / EKG:  The patient was maintained on a cardiac monitor.  I personally viewed and interpreted the cardiac monitored which showed an underlying rhythm of: Sinus rhythm   Consultations Obtained:  I requested consultation with the [facility],  and discussed lab and imaging findings as well as pertinent plan - they recommend: Orthopedic team with [facility], they recommend seeing Dr. Liam on Thursday in the office, recommend knee immobilizer and as much bedrest as possible prior to that.   Problem List / ED Course / Critical interventions / Medication management  See sedation and reduction notes I ordered medication including fentanyl  for pain Reevaluation of the patient after these medicines showed that the patient improved I have reviewed the patients home medicines and have made adjustments as needed   Social Determinants of Health:  Recurrent dislocation of hip   Test / Admission -  Considered:  Considered admission or transfer but the patient is stabilized and the orthopedic team does not need to see the patient in the emergency department today, they recommend outpatient follow-up I think this is reasonable given the successful reduction On repeat exam the patient is essentially pain-free   I have discussed with the patient at the bedside the results, and the meaning of these results.  They have had opportunity to ask questions,  expressed their understanding to the need for follow-up with primary care physician  Patient placed in knee immobilizer        Final Clinical Impression(s) / ED Diagnoses Final diagnoses:  Hip dislocation, right, initial encounter [facility])    Rx / DC Orders ED Discharge Orders     None         Cleotilde Rogue, MD 05/27/23 1739

## 2023-05-27 NOTE — Discharge Instructions (Addendum)
 Discussed your care with orthopedic team, they want you to have minimal movement between now and the office on Thursday.  They will be able to see you on Thursday but you need to call tomorrow morning to make the appointment.  Please keep the knee immobilizer on, rest in bed is much as possible, you need to be seen in the office before they can think about any other possible fixes for your hip.  If it dislocates again come back to the ER

## 2023-05-27 NOTE — ED Triage Notes (Signed)
Pt BIB ems from a fall. Pt rt hip popped out of place. EMS administered 100 mcg for a 10/10 pain. Pt is now 2/10. Pt is from home.

## 2023-05-29 ENCOUNTER — Other Ambulatory Visit: Payer: Self-pay | Admitting: Orthopedic Surgery

## 2023-05-29 DIAGNOSIS — M25551 Pain in right hip: Secondary | ICD-10-CM | POA: Diagnosis not present

## 2023-05-30 NOTE — Patient Instructions (Signed)
 DUE TO COVID-19 ONLY TWO VISITORS  (aged 82 and older)  ARE ALLOWED TO COME WITH YOU AND STAY IN THE WAITING ROOM ONLY DURING PRE OP AND PROCEDURE.   **NO VISITORS ARE ALLOWED IN THE SHORT STAY AREA OR RECOVERY ROOM!!**  IF YOU WILL BE ADMITTED INTO THE HOSPITAL YOU ARE ALLOWED ONLY FOUR SUPPORT PEOPLE DURING VISITATION HOURS ONLY (7 AM -8PM)   The support person(s) must pass our screening, gel in and out, and wear a mask at all times, including in the patient's room. Patients must also wear a mask when staff or their support person are in the room. Visitors GUEST BADGE MUST BE WORN VISIBLY  One adult visitor may remain with you overnight and MUST be in the room by 8 P.M.     Your procedure is scheduled on: 06/06/23   Report to [facility] Main Entrance    Report to admitting at : 9:45 AM   Call this number if you have problems the morning of surgery [phone]   Do not eat food :After Midnight.   After Midnight you may have the following liquids until : 9:00 AM DAY OF SURGERY  Water  Black Coffee (sugar ok, NO MILK/CREAM OR CREAMERS)  Tea (sugar ok, NO MILK/CREAM OR CREAMERS) regular and decaf                             Plain Jell-O (NO RED)                                           Fruit ices (not with fruit pulp, NO RED)                                     Popsicles (NO RED)                                                                  Juice: apple, WHITE grape, WHITE cranberry Sports drinks like Gatorade (NO RED)   The day of surgery:  Drink ONE (1) Pre-Surgery Clear Ensure at : 9:00 AM the morning of surgery. Drink in one sitting. Do not sip.  This drink was given to you during your hospital  pre-op appointment visit. Nothing else to drink after completing the  Pre-Surgery Clear Ensure or G2.          If you have questions, please contact your surgeon's office.  FOLLOW ANY ADDITIONAL PRE OP INSTRUCTIONS YOU RECEIVED FROM YOUR SURGEON'S OFFICE!!!   Oral  Hygiene is also important to reduce your risk of infection.                                    Remember - BRUSH YOUR TEETH THE MORNING OF SURGERY WITH YOUR REGULAR TOOTHPASTE  DENTURES WILL BE REMOVED PRIOR TO SURGERY PLEASE DO NOT APPLY "Poly grip" OR ADHESIVES!!!   Do NOT smoke after Midnight   Take these medicines the morning of surgery  with A SIP OF WATER : amlodipine ,famotidine .                              You may not have any metal on your body including hair pins, jewelry, and body piercing             Do not wear make-up, lotions, powders, perfumes/cologne, or deodorant  Do not wear nail polish including gel and S&S, artificial/acrylic nails, or any other type of covering on natural nails including finger and toenails. If you have artificial nails, gel coating, etc. that needs to be removed by a nail salon please have this removed prior to surgery or surgery may need to be canceled/ delayed if the surgeon/ anesthesia feels like they are unable to be safely monitored.   Do not shave  48 hours prior to surgery.    Do not bring valuables to the hospital. [place] IS NOT             RESPONSIBLE   FOR VALUABLES.   Contacts, glasses, or bridgework may not be worn into surgery.   Bring small overnight bag day of surgery.   DO NOT BRING YOUR HOME MEDICATIONS TO THE HOSPITAL. PHARMACY WILL DISPENSE MEDICATIONS LISTED ON YOUR MEDICATION LIST TO YOU DURING YOUR ADMISSION IN THE HOSPITAL!    Patients discharged on the day of surgery will not be allowed to drive home.  Someone NEEDS to stay with you for the first 24 hours after anesthesia.   Special Instructions: Bring a copy of your healthcare power of attorney and living will documents         the day of surgery if you haven't scanned them before.              Please read over the following fact sheets you were given: IF YOU HAVE QUESTIONS ABOUT YOUR PRE-OP INSTRUCTIONS PLEASE CALL [phone]      Pre-operative 5 CHG Bath  Instructions   You can play a key role in reducing the risk of infection after surgery. Your skin needs to be as free of germs as possible. You can reduce the number of germs on your skin by washing with CHG (chlorhexidine  gluconate) soap before surgery. CHG is an antiseptic soap that kills germs and continues to kill germs even after washing.   DO NOT use if you have an allergy to chlorhexidine /CHG or antibacterial soaps. If your skin becomes reddened or irritated, stop using the CHG and notify one of our RNs at : [phone].   Please shower with the CHG soap starting 4 days before surgery using the following schedule:     Please keep in mind the following:  DO NOT shave, including legs and underarms, starting the day of your first shower.   You may shave your face at any point before/day of surgery.  Place clean sheets on your bed the day you start using CHG soap. Use a clean washcloth (not used since being washed) for each shower. DO NOT sleep with pets once you start using the CHG.   CHG Shower Instructions:  If you choose to wash your hair and private area, wash first with your normal shampoo/soap.  After you use shampoo/soap, rinse your hair and body thoroughly to remove shampoo/soap residue.  Turn the water  OFF and apply about 3 tablespoons (45 ml) of CHG soap to a CLEAN washcloth.  Apply CHG soap ONLY FROM YOUR NECK DOWN  TO YOUR TOES (washing for 3-5 minutes)  DO NOT use CHG soap on face, private areas, open wounds, or sores.  Pay special attention to the area where your surgery is being performed.  If you are having back surgery, having someone wash your back for you may be helpful. Wait 2 minutes after CHG soap is applied, then you may rinse off the CHG soap.  Pat dry with a clean towel  Put on clean clothes/pajamas   If you choose to wear lotion, please use ONLY the CHG-compatible lotions on the back of this paper.     Additional instructions for the day of surgery: DO NOT  APPLY any lotions, deodorants, cologne, or perfumes.   Put on clean/comfortable clothes.  Brush your teeth.  Ask your nurse before applying any prescription medications to the skin.   CHG Compatible Lotions   Aveeno Moisturizing lotion  Cetaphil Moisturizing Cream  Cetaphil Moisturizing Lotion  Clairol Herbal Essence Moisturizing Lotion, Dry Skin  Clairol Herbal Essence Moisturizing Lotion, Extra Dry Skin  Clairol Herbal Essence Moisturizing Lotion, Normal Skin  Curel Age Defying Therapeutic Moisturizing Lotion with Alpha Hydroxy  Curel Extreme Care Body Lotion  Curel Soothing Hands Moisturizing Hand Lotion  Curel Therapeutic Moisturizing Cream, Fragrance-Free  Curel Therapeutic Moisturizing Lotion, Fragrance-Free  Curel Therapeutic Moisturizing Lotion, Original Formula  Eucerin Daily Replenishing Lotion  Eucerin Dry Skin Therapy Plus Alpha Hydroxy Crme  Eucerin Dry Skin Therapy Plus Alpha Hydroxy Lotion  Eucerin Original Crme  Eucerin Original Lotion  Eucerin Plus Crme Eucerin Plus Lotion  Eucerin TriLipid Replenishing Lotion  Keri Anti-Bacterial Hand Lotion  Keri Deep Conditioning Original Lotion Dry Skin Formula Softly Scented  Keri Deep Conditioning Original Lotion, Fragrance Free Sensitive Skin Formula  Keri Lotion Fast Absorbing Fragrance Free Sensitive Skin Formula  Keri Lotion Fast Absorbing Softly Scented Dry Skin Formula  Keri Original Lotion  Keri Skin Renewal Lotion Keri Silky Smooth Lotion  Keri Silky Smooth Sensitive Skin Lotion  Nivea Body Creamy Conditioning Oil  Nivea Body Extra Enriched Lotion  Nivea Body Original Lotion  Nivea Body Sheer Moisturizing Lotion Nivea Crme  Nivea Skin Firming Lotion  NutraDerm 30 Skin Lotion  NutraDerm Skin Lotion  NutraDerm Therapeutic Skin Cream  NutraDerm Therapeutic Skin Lotion  ProShield Protective Hand Cream  Provon moisturizing lotion   Incentive Spirometer  An incentive spirometer is a tool that can help keep  your lungs clear and active. This tool measures how well you are filling your lungs with each breath. Taking long deep breaths may help reverse or decrease the chance of developing breathing (pulmonary) problems (especially infection) following: A long period of time when you are unable to move or be active. BEFORE THE PROCEDURE  If the spirometer includes an indicator to show your best effort, your nurse or respiratory therapist will set it to a desired goal. If possible, sit up straight or lean slightly forward. Try not to slouch. Hold the incentive spirometer in an upright position. INSTRUCTIONS FOR USE  Sit on the edge of your bed if possible, or sit up as far as you can in bed or on a chair. Hold the incentive spirometer in an upright position. Breathe out normally. Place the mouthpiece in your mouth and seal your lips tightly around it. Breathe in slowly and as deeply as possible, raising the piston or the ball toward the top of the column. Hold your breath for 3-5 seconds or for as long as possible. Allow the piston or ball to fall  to the bottom of the column. Remove the mouthpiece from your mouth and breathe out normally. Rest for a few seconds and repeat Steps 1 through 7 at least 10 times every 1-2 hours when you are awake. Take your time and take a few normal breaths between deep breaths. The spirometer may include an indicator to show your best effort. Use the indicator as a goal to work toward during each repetition. After each set of 10 deep breaths, practice coughing to be sure your lungs are clear. If you have an incision (the cut made at the time of surgery), support your incision when coughing by placing a pillow or rolled up towels firmly against it. Once you are able to get out of bed, walk around indoors and cough well. You may stop using the incentive spirometer when instructed by your caregiver.  RISKS AND COMPLICATIONS Take your time so you do not get dizzy or  light-headed. If you are in pain, you may need to take or ask for pain medication before doing incentive spirometry. It is harder to take a deep breath if you are having pain. AFTER USE Rest and breathe slowly and easily. It can be helpful to keep track of a log of your progress. Your caregiver can provide you with a simple table to help with this. If you are using the spirometer at home, follow these instructions: SEEK MEDICAL CARE IF:  You are having difficultly using the spirometer. You have trouble using the spirometer as often as instructed. Your pain medication is not giving enough relief while using the spirometer. You develop fever of 100.5 F (38.1 C) or higher. SEEK IMMEDIATE MEDICAL CARE IF:  You cough up bloody sputum that had not been present before. You develop fever of 102 F (38.9 C) or greater. You develop worsening pain at or near the incision site. MAKE SURE YOU:  Understand these instructions. Will watch your condition. Will get help right away if you are not doing well or get worse. Document Released: 08/19/2006 Document Revised: 07/01/2011 Document Reviewed: 10/20/2006 [facility] Patient Information 2014 [place], [place].   ________________________________________________________________________

## 2023-06-02 ENCOUNTER — Other Ambulatory Visit: Payer: Self-pay

## 2023-06-02 ENCOUNTER — Encounter (HOSPITAL_COMMUNITY)
Admission: RE | Admit: 2023-06-02 | Discharge: 2023-06-02 | Disposition: A | Discharge status: Home / Self Care | Payer: Medicare Other | Source: Ambulatory Visit | Attending: Orthopedic Surgery | Admitting: Orthopedic Surgery

## 2023-06-02 ENCOUNTER — Encounter (HOSPITAL_COMMUNITY): Payer: Self-pay

## 2023-06-02 VITALS — BP 114/62 | HR 79 | Temp 98.2°F | Ht 66.0 in | Wt 134.0 lb

## 2023-06-02 DIAGNOSIS — Z01812 Encounter for preprocedural laboratory examination: Secondary | ICD-10-CM | POA: Diagnosis not present

## 2023-06-02 DIAGNOSIS — M24451 Recurrent dislocation, right hip: Secondary | ICD-10-CM | POA: Diagnosis not present

## 2023-06-02 DIAGNOSIS — Z96641 Presence of right artificial hip joint: Secondary | ICD-10-CM | POA: Diagnosis not present

## 2023-06-02 DIAGNOSIS — Z01818 Encounter for other preprocedural examination: Secondary | ICD-10-CM

## 2023-06-02 DIAGNOSIS — I10 Essential (primary) hypertension: Secondary | ICD-10-CM | POA: Insufficient documentation

## 2023-06-02 DIAGNOSIS — I2510 Atherosclerotic heart disease of native coronary artery without angina pectoris: Secondary | ICD-10-CM | POA: Diagnosis not present

## 2023-06-02 LAB — SURGICAL PCR SCREEN
MRSA, PCR: NEGATIVE
Staphylococcus aureus: NEGATIVE

## 2023-06-02 LAB — CBC
HCT: 32.4 % — ABNORMAL LOW (ref 36.0–46.0)
Hemoglobin: 10.1 g/dL — ABNORMAL LOW (ref 12.0–15.0)
MCH: 29 pg (ref 26.0–34.0)
MCHC: 31.2 g/dL (ref 30.0–36.0)
MCV: 93.1 fL (ref 80.0–100.0)
Platelets: 219 10*3/uL (ref 150–400)
RBC: 3.48 MIL/uL — ABNORMAL LOW (ref 3.87–5.11)
RDW: 16.6 % — ABNORMAL HIGH (ref 11.5–15.5)
WBC: 4.9 10*3/uL (ref 4.0–10.5)
nRBC: 0 % (ref 0.0–0.2)

## 2023-06-02 LAB — BASIC METABOLIC PANEL
Anion gap: 9 (ref 5–15)
BUN: 19 mg/dL (ref 8–23)
CO2: 29 mmol/L (ref 22–32)
Calcium: 10 mg/dL (ref 8.9–10.3)
Chloride: 102 mmol/L (ref 98–111)
Creatinine, Ser: 1.05 mg/dL — ABNORMAL HIGH (ref 0.44–1.00)
GFR, Estimated: 53 mL/min — ABNORMAL LOW (ref >60)
Glucose, Bld: 94 mg/dL (ref 70–99)
Potassium: 3.9 mmol/L (ref 3.5–5.1)
Sodium: 140 mmol/L (ref 135–145)

## 2023-06-02 NOTE — Progress Notes (Addendum)
 For Anesthesia: PCP - Delfina Feller, FNP  Cardiologist - Mikki Alexander, MD . LOV: 03/24/23: CEW  Bowel Prep reminder:  Chest x-ray -  EKG - 05/28/23: media tab Stress Test -  ECHO -03/02/22 : [facility] Cardiac Cath - 04/29/05 Pacemaker/ICD device last checked: Pacemaker orders received: Device Rep notified:  Spinal Cord Stimulator:  Sleep Study - N/A CPAP -   Fasting Blood Sugar - N/A Checks Blood Sugar _____ times a day Date and result of last Hgb A1c-  Last dose of GLP1 agonist- N/A GLP1 instructions:   Last dose of SGLT-2 inhibitors- N/A SGLT-2 instructions:   Blood Thinner Instructions:N/A Aspirin  Instructions: Last Dose:  Activity level: Can go up a flight of stairs and activities of daily living without stopping and without chest pain and/or shortness of breath     Unable to go up a flight of stairs due to right hip pain.    Anesthesia review: Hx: CAD,HTN,MI  Patient denies shortness of breath, fever, cough and chest pain at PAT appointment   Patient verbalized understanding of instructions that were given to them at the PAT appointment. Patient was also instructed that they will need to review over the PAT instructions again at home before surgery.

## 2023-06-03 NOTE — Progress Notes (Signed)
Anesthesia Chart Review   Case: [id number] Date/Time: 06/06/23 1204   Procedure: RIGHT TOTAL HIP REVISION (Right: Hip)   Anesthesia type: Spinal   Pre-op diagnosis: RECURRENT DISLOCATING RIGHT HIP REPLACEMENT   Location: [facility] ROOM 08 / [facility]   Surgeons: Gean Birchwood, MD       DISCUSSION:83 y.o. never smoker with h/o HTN, CAD, recurrent right hip dislocation scheduled for above procedure 06/06/2023 with Dr. Gean Birchwood.   Pt last seen by cardiology 03/24/2023. Stable at this visit with no CV symptoms. 1 year follow up recommended.   Per cardiology preoperative evaluation 12/30/2022, "RCRI score: 1. Intermediate Risk  Functional capacity: NM Stress 09/23/2018 negative   LVEF none  No further cardiac testing is warranted. Patient should continue betablocker through surgery as clinically able if applicable."   VS: BP 114/62   Pulse 79   Temp 36.8 C (Oral)   Ht 5\' 6"  (1.676 m)   Wt 60.8 kg   SpO2 95%   BMI 21.63 kg/m   PROVIDERS: Bennie Pierini, FNP is PCP   [facility]: [phone] LABS: Labs reviewed: Acceptable for surgery. (all labs ordered are listed, but only abnormal results are displayed)  Labs Reviewed  BASIC METABOLIC PANEL - Abnormal; Notable for the following components:      Result Value   Creatinine, Ser 1.05 (*)    GFR, Estimated 53 (*)    All other components within normal limits  CBC - Abnormal; Notable for the following components:   RBC 3.48 (*)    Hemoglobin 10.1 (*)    HCT 32.4 (*)    RDW 16.6 (*)    All other components within normal limits  SURGICAL PCR SCREEN     IMAGES:   EKG:   CV:  Past Medical History:  Diagnosis Date   Anxiety    Arthritis    Knees, hips   CAD (coronary artery disease)    Chronic back pain    Depression    GERD (gastroesophageal reflux disease)    Hyperlipidemia    Hypertension    MI (myocardial infarction) (HCC)    Osteopenia     Past Surgical History:  Procedure  Laterality Date   BIOPSY  07/16/2021   Procedure: BIOPSY;  Surgeon: Lanelle Bal, DO;  [facility]: [facility];  Service: Endoscopy;;   COLONOSCOPY WITH PROPOFOL N/A 07/16/2021   Procedure: COLONOSCOPY WITH PROPOFOL;  Surgeon: Lanelle Bal, DO;  [facility];  Service: Endoscopy;  Laterality: N/A;  10:30am   ECTOPIC PREGNANCY SURGERY     ESOPHAGOGASTRODUODENOSCOPY (EGD) WITH PROPOFOL N/A 07/16/2021   Procedure: ESOPHAGOGASTRODUODENOSCOPY (EGD) WITH PROPOFOL;  Surgeon: Lanelle Bal, DO;  [facility];  Service: Endoscopy;  Laterality: N/A;   left breast biospy     left wrist fracture     TOTAL HIP ARTHROPLASTY Right 02/17/2023   Procedure: RIGHT TOTAL HIP ARTHROPLASTY ANTERIOR APPROACH;  Surgeon: Gean Birchwood, MD;  Location: [facility];  Service: Orthopedics;  Laterality: Right;   TOTAL KNEE ARTHROPLASTY Left 01/25/2019   Procedure: LEFT TOTAL KNEE ARTHROPLASTY;  Surgeon: Gean Birchwood, MD;  Location: [facility];  Service: Orthopedics;  Laterality: Left;    MEDICATIONS:  amLODipine (NORVASC) 5 MG tablet   aspirin EC 81 MG tablet   aspirin EC 81 MG tablet   atorvastatin (LIPITOR) 40 MG tablet   benazepril (LOTENSIN) 40 MG tablet   bisacodyl (DULCOLAX) 5 MG EC tablet   cholecalciferol (VITAMIN D) 1000 UNITS tablet  cyanocobalamin (VITAMIN B12) 1000 MCG tablet   diclofenac sodium (VOLTAREN) 1 % GEL   escitalopram (LEXAPRO) 20 MG tablet   famotidine (PEPCID) 20 MG tablet   ferrous sulfate 325 (65 FE) MG tablet   hydrochlorothiazide (HYDRODIURIL) 25 MG tablet   HYDROmorphone (DILAUDID) 2 MG tablet   isosorbide mononitrate (IMDUR) 60 MG 24 hr tablet   metoprolol succinate (TOPROL-XL) 25 MG 24 hr tablet   nitroGLYCERIN (NITROSTAT) 0.4 MG SL tablet   polyethylene glycol powder (GLYCOLAX/MIRALAX) 17 GM/SCOOP powder   potassium chloride (KLOR-CON M10) 10 MEQ tablet   Probiotic CHEW   tiZANidine (ZANAFLEX) 2 MG tablet   traMADol (ULTRAM) 50 MG tablet   traMADol  (ULTRAM) 50 MG tablet   No current facility-administered medications for this encounter.      Jodell Cipro Ward, PA-C [facility] [phone]

## 2023-06-05 ENCOUNTER — Encounter: Payer: Self-pay | Admitting: Gastroenterology

## 2023-06-05 DIAGNOSIS — Z96649 Presence of unspecified artificial hip joint: Principal | ICD-10-CM

## 2023-06-05 NOTE — H&P (Signed)
TOTAL HIP REVISION ADMISSION H&P  Patient is admitted for right revision total hip arthroplasty.  Subjective:  Chief Complaint: right hip pain  HPI: Teresa Holloway, 83 y.o. female, has a history of pain and functional disability in the right hip due to  recurrent dislocations  and patient has failed non-surgical conservative treatments for greater than 12 weeks to include NSAID's and/or analgesics, supervised PT with diminished ADL's post treatment, use of assistive devices, and activity modification. The indications for the revision total hip arthroplasty are fracture or mechanical failure of one or more component and recurrent dislocations .  Onset of symptoms was abrupt starting 1 years ago with rapidlly worsening course since that time.  Prior procedures on the right hip include arthroplasty.  Patient currently rates pain in the right hip at 10 out of 10 with activity.  There is night pain and worsening of pain with activity and weight bearing. Patient has evidence of prosthetic loosening by imaging studies.  This condition presents safety issues increasing the risk of falls.  This patient has had  recurrent dislocations .  There is no current active infection.  Patient Active Problem List   Diagnosis Date Noted   Failed total hip arthroplasty (HCC) 06/05/2023   S/P total right hip arthroplasty 02/17/2023   Osteoarthritis of right hip 02/14/2023   Hypokalemia 09/05/2022   Anemia 02/05/2022   Constipation 10/29/2021   Helicobacter pylori gastritis 10/29/2021   Status post total left knee replacement 01/25/2019   Pain management contract agreement 02/22/2016   Opioid type dependence, continuous (HCC) 02/22/2016   BMI 30.0-30.9,adult 01/02/2015   Osteoarthritis of left hip 08/23/2013   Depression 08/23/2013   Osteoporosis 06/23/2013   Glaucoma of right eye 06/23/2013   CAD (coronary artery disease) 08/19/2012   History of MI (myocardial infarction) 08/19/2012   Arthritis of both  knees 08/19/2012   Essential hypertension, benign 08/10/2012   Hyperlipidemia with target LDL less than 100 08/10/2012   GERD (gastroesophageal reflux disease) 08/10/2012   Past Medical History:  Diagnosis Date   Anxiety    Arthritis    Knees, hips   CAD (coronary artery disease)    Chronic back pain    Depression    GERD (gastroesophageal reflux disease)    Hyperlipidemia    Hypertension    MI (myocardial infarction) (HCC)    Osteopenia     Past Surgical History:  Procedure Laterality Date   BIOPSY  07/16/2021   Procedure: BIOPSY;  Surgeon: Lanelle Bal, DO;  Location: [facility];  Service: Endoscopy;;   COLONOSCOPY WITH PROPOFOL N/A 07/16/2021   Procedure: COLONOSCOPY WITH PROPOFOL;  Surgeon: Lanelle Bal, DO;  Location: [facility];  Service: Endoscopy;  Laterality: N/A;  10:30am   ECTOPIC PREGNANCY SURGERY     ESOPHAGOGASTRODUODENOSCOPY (EGD) WITH PROPOFOL N/A 07/16/2021   Procedure: ESOPHAGOGASTRODUODENOSCOPY (EGD) WITH PROPOFOL;  Surgeon: Lanelle Bal, DO;  Location: [facility];  Service: Endoscopy;  Laterality: N/A;   left breast biospy     left wrist fracture     TOTAL HIP ARTHROPLASTY Right 02/17/2023   Procedure: RIGHT TOTAL HIP ARTHROPLASTY ANTERIOR APPROACH;  Surgeon: Gean Birchwood, MD;  Location: [facility];  Service: Orthopedics;  Laterality: Right;   TOTAL KNEE ARTHROPLASTY Left 01/25/2019   Procedure: LEFT TOTAL KNEE ARTHROPLASTY;  Surgeon: Gean Birchwood, MD;  Location: [facility];  Service: Orthopedics;  Laterality: Left;    No current facility-administered medications for this encounter.   Current Outpatient Medications  Medication  Sig Dispense Refill Last Dose/Taking   amLODipine (NORVASC) 5 MG tablet Take 1 tablet (5 mg total) by mouth daily. 90 tablet 1 Taking   aspirin EC 81 MG tablet Take 1 tablet (81 mg total) by mouth 2 (two) times daily. (Patient taking differently: Take 81 mg by mouth daily.) 60 tablet 0 Taking Differently   atorvastatin  (LIPITOR) 40 MG tablet TAKE 1 TABLET BY MOUTH DAILY AT 6 PM. 90 tablet 1 Taking   benazepril (LOTENSIN) 40 MG tablet Take 1 tablet (40 mg total) by mouth daily. (Patient taking differently: Take 40 mg by mouth at bedtime.) 90 tablet 1 Taking Differently   bisacodyl (DULCOLAX) 5 MG EC tablet Take 5 mg by mouth at bedtime.   Taking   cholecalciferol (VITAMIN D) 1000 UNITS tablet Take 1,000 Units by mouth daily.   Taking   cyanocobalamin (VITAMIN B12) 1000 MCG tablet Take 1,000 mcg by mouth daily.   Taking   famotidine (PEPCID) 20 MG tablet Take 1 tablet (20 mg total) by mouth 2 (two) times daily. 30 tablet 0 Taking   ferrous sulfate 325 (65 FE) MG tablet Take 325 mg by mouth daily with breakfast.   Taking   hydrochlorothiazide (HYDRODIURIL) 25 MG tablet Take 1 tablet (25 mg total) by mouth daily. 90 tablet 1 Taking   isosorbide mononitrate (IMDUR) 60 MG 24 hr tablet TAKE 1 TABLET BY MOUTH EVERYDAY AT BEDTIME 90 tablet 1 Taking   nitroGLYCERIN (NITROSTAT) 0.4 MG SL tablet Place 1 tablet (0.4 mg total) under the tongue every 5 (five) minutes as needed for chest pain. 30 tablet 1 Taking As Needed   polyethylene glycol powder (GLYCOLAX/MIRALAX) 17 GM/SCOOP powder Take 17 g by mouth daily. 255 g 0 Taking   potassium chloride (KLOR-CON M10) 10 MEQ tablet Take 1 tablet (10 mEq total) by mouth 2 (two) times daily. 180 tablet 1 Taking   Probiotic CHEW Chew 1 capsule by mouth daily.   Taking   traMADol (ULTRAM) 50 MG tablet Take 1 tablet (50 mg total) by mouth 2 (two) times daily. 60 tablet 0 Taking   aspirin EC 81 MG tablet Take 1 tablet (81 mg total) by mouth 2 (two) times daily. (Patient not taking: Reported on 05/29/2023) 60 tablet 0 Not Taking   diclofenac sodium (VOLTAREN) 1 % GEL Apply 2 g topically 4 (four) times daily. (Patient not taking: Reported on 05/29/2023) 500 g 1 Not Taking   escitalopram (LEXAPRO) 20 MG tablet Take 1 tablet (20 mg total) by mouth daily. (Patient not taking: Reported on 05/29/2023) 90  tablet 1 Not Taking   HYDROmorphone (DILAUDID) 2 MG tablet Take 1 tablet (2 mg total) by mouth every 6 (six) hours as needed for severe pain (pain score 7-10). (Patient not taking: Reported on 05/29/2023) 20 tablet 0 Not Taking   metoprolol succinate (TOPROL-XL) 25 MG 24 hr tablet Take 1 tablet (25 mg total) by mouth daily. (Patient not taking: Reported on 05/29/2023) 30 tablet 11 Not Taking   tiZANidine (ZANAFLEX) 2 MG tablet Take 1 tablet (2 mg total) by mouth every 6 (six) hours as needed for muscle spasms. (Patient not taking: Reported on 05/29/2023) 60 tablet 0 Not Taking   traMADol (ULTRAM) 50 MG tablet Take 1 tablet (50 mg total) by mouth every 6 (six) hours as needed. (Patient not taking: Reported on 05/29/2023) 15 tablet 0 Not Taking   Allergies  Allergen Reactions   Atarax [Hydroxyzine] Nausea And Vomiting   Bactrim [Sulfamethoxazole-Trimethoprim] Nausea Only  Coreg [Carvedilol] Other (See Comments)    Unknown    Social History   Tobacco Use   Smoking status: Never   Smokeless tobacco: Never  Substance Use Topics   Alcohol use: No    Family History  Problem Relation Age of Onset   Asthma Father    Diabetes Sister    Diabetes Brother       Review of Systems  Constitutional: Negative.   HENT: Negative.    Eyes: Negative.   Respiratory: Negative.    Cardiovascular: Negative.   Gastrointestinal: Negative.   Endocrine: Negative.   Genitourinary: Negative.   Musculoskeletal:  Positive for arthralgias and myalgias.  Allergic/Immunologic: Negative.   Neurological: Negative.   Hematological: Negative.   Psychiatric/Behavioral: Negative.      Objective:  Physical Exam Constitutional:      Appearance: Normal appearance. She is normal weight.  HENT:     Head: Normocephalic and atraumatic.  Eyes:     Pupils: Pupils are equal, round, and reactive to light.  Cardiovascular:     Pulses: Normal pulses.  Pulmonary:     Effort: Pulmonary effort is normal.  Musculoskeletal:      Cervical back: Normal range of motion and neck supple.     Comments: Her surgical wound is well-healed there is no erythema her leg has 1+ swelling along the thigh hip flexors abductors abductors and extensors are grossly intact.  Toes are pink and well perfused.  Skin:    General: Skin is warm and dry.  Neurological:     General: No focal deficit present.     Mental Status: She is alert and oriented to person, place, and time. Mental status is at baseline.  Psychiatric:        Mood and Affect: Mood normal.        Behavior: Behavior normal.        Thought Content: Thought content normal.        Judgment: Judgment normal.     Vital signs in last 24 hours:     Labs:   Estimated body mass index is 21.63 kg/m as calculated from the following:   Height as of 06/02/23: 5\' 6"  (1.676 m).   Weight as of 06/02/23: 60.8 kg.  Imaging Review:  Plain radiographs demonstrate  AP pelvis and crosstable lateral shows that the stem and hip ball are in good position.  The cup appears to be ingrown but comparing the x-rays from January and today to the ones that were done at the time of her surgery the cup looks like it may have gone from 50 of abduction to 70 of abduction.  Anteversion on the crosstable lateral is 30.    Assessment/Plan:  End stage arthritis, right hip(s) with failed previous arthroplasty.  The patient history, physical examination, clinical judgement of the provider and imaging studies are consistent with end stage degenerative joint disease of the right hip(s), previous total hip arthroplasty. Revision total hip arthroplasty is deemed medically necessary. The treatment options including medical management, injection therapy, arthroscopy and arthroplasty were discussed at length. The risks and benefits of total hip arthroplasty were presented and reviewed. The risks due to aseptic loosening, infection, stiffness, dislocation/subluxation,  thromboembolic complications and  other imponderables were discussed.  The patient acknowledged the explanation, agreed to proceed with the plan and consent was signed. Patient is being admitted for inpatient treatment for surgery, pain control, PT, OT, prophylactic antibiotics, VTE prophylaxis, progressive ambulation and ADL's and discharge planning. The patient is  planning to be discharged home with home health services

## 2023-06-06 ENCOUNTER — Inpatient Hospital Stay (HOSPITAL_COMMUNITY): Payer: Medicare Other | Admitting: Physician Assistant

## 2023-06-06 ENCOUNTER — Other Ambulatory Visit: Payer: Self-pay

## 2023-06-06 ENCOUNTER — Inpatient Hospital Stay (HOSPITAL_COMMUNITY)
Admission: RE | Admit: 2023-06-06 | Discharge: 2023-06-10 | DRG: 468 | Disposition: A | Discharge status: Home / Self Care | Payer: Medicare Other | Attending: Orthopedic Surgery | Admitting: Orthopedic Surgery

## 2023-06-06 ENCOUNTER — Inpatient Hospital Stay (HOSPITAL_COMMUNITY): Payer: Medicare Other

## 2023-06-06 ENCOUNTER — Inpatient Hospital Stay (HOSPITAL_COMMUNITY): Payer: Medicare Other | Admitting: Anesthesiology

## 2023-06-06 ENCOUNTER — Encounter (HOSPITAL_COMMUNITY): Payer: Self-pay | Admitting: Orthopedic Surgery

## 2023-06-06 ENCOUNTER — Encounter (HOSPITAL_COMMUNITY)
Admission: RE | Disposition: A | Discharge status: Home / Self Care | Payer: Self-pay | Source: Home / Self Care | Attending: Orthopedic Surgery

## 2023-06-06 DIAGNOSIS — T84020A Dislocation of internal right hip prosthesis, initial encounter: Principal | ICD-10-CM | POA: Diagnosis present

## 2023-06-06 DIAGNOSIS — I2510 Atherosclerotic heart disease of native coronary artery without angina pectoris: Secondary | ICD-10-CM | POA: Diagnosis not present

## 2023-06-06 DIAGNOSIS — M810 Age-related osteoporosis without current pathological fracture: Secondary | ICD-10-CM | POA: Diagnosis not present

## 2023-06-06 DIAGNOSIS — Y792 Prosthetic and other implants, materials and accessory orthopedic devices associated with adverse incidents: Secondary | ICD-10-CM | POA: Diagnosis present

## 2023-06-06 DIAGNOSIS — F32A Depression, unspecified: Secondary | ICD-10-CM | POA: Diagnosis present

## 2023-06-06 DIAGNOSIS — I252 Old myocardial infarction: Secondary | ICD-10-CM | POA: Diagnosis not present

## 2023-06-06 DIAGNOSIS — Z882 Allergy status to sulfonamides status: Secondary | ICD-10-CM

## 2023-06-06 DIAGNOSIS — Z96652 Presence of left artificial knee joint: Secondary | ICD-10-CM | POA: Diagnosis present

## 2023-06-06 DIAGNOSIS — Z888 Allergy status to other drugs, medicaments and biological substances status: Secondary | ICD-10-CM

## 2023-06-06 DIAGNOSIS — I10 Essential (primary) hypertension: Secondary | ICD-10-CM | POA: Diagnosis present

## 2023-06-06 DIAGNOSIS — Z79899 Other long term (current) drug therapy: Secondary | ICD-10-CM | POA: Diagnosis not present

## 2023-06-06 DIAGNOSIS — Z825 Family history of asthma and other chronic lower respiratory diseases: Secondary | ICD-10-CM | POA: Diagnosis not present

## 2023-06-06 DIAGNOSIS — H409 Unspecified glaucoma: Secondary | ICD-10-CM | POA: Diagnosis not present

## 2023-06-06 DIAGNOSIS — Z7982 Long term (current) use of aspirin: Secondary | ICD-10-CM

## 2023-06-06 DIAGNOSIS — K219 Gastro-esophageal reflux disease without esophagitis: Secondary | ICD-10-CM | POA: Diagnosis not present

## 2023-06-06 DIAGNOSIS — Z833 Family history of diabetes mellitus: Secondary | ICD-10-CM

## 2023-06-06 DIAGNOSIS — Z96649 Presence of unspecified artificial hip joint: Principal | ICD-10-CM

## 2023-06-06 DIAGNOSIS — M24451 Recurrent dislocation, right hip: Principal | ICD-10-CM | POA: Diagnosis present

## 2023-06-06 DIAGNOSIS — M549 Dorsalgia, unspecified: Secondary | ICD-10-CM | POA: Diagnosis present

## 2023-06-06 DIAGNOSIS — G8929 Other chronic pain: Secondary | ICD-10-CM | POA: Diagnosis not present

## 2023-06-06 DIAGNOSIS — E785 Hyperlipidemia, unspecified: Secondary | ICD-10-CM | POA: Diagnosis present

## 2023-06-06 DIAGNOSIS — F418 Other specified anxiety disorders: Secondary | ICD-10-CM | POA: Diagnosis not present

## 2023-06-06 DIAGNOSIS — Z96641 Presence of right artificial hip joint: Secondary | ICD-10-CM | POA: Diagnosis not present

## 2023-06-06 DIAGNOSIS — T84020D Dislocation of internal right hip prosthesis, subsequent encounter: Secondary | ICD-10-CM | POA: Diagnosis not present

## 2023-06-06 DIAGNOSIS — T84018A Broken internal joint prosthesis, other site, initial encounter: Secondary | ICD-10-CM

## 2023-06-06 HISTORY — PX: TOTAL HIP ARTHROPLASTY: SHX124

## 2023-06-06 SURGERY — ARTHROPLASTY, HIP, TOTAL, ANTERIOR APPROACH
Anesthesia: Spinal | Site: Hip | Laterality: Right

## 2023-06-06 MED ORDER — BISACODYL 5 MG PO TBEC
5.0000 mg | DELAYED_RELEASE_TABLET | Freq: Every day | ORAL | Status: DC
  Administered 2023-06-06 – 2023-06-09 (×4): 5 mg via ORAL
  Filled 2023-06-06 (×4): qty 1

## 2023-06-06 MED ORDER — ACETAMINOPHEN 10 MG/ML IV SOLN: 1000.0000 mg | Freq: Once | INTRAVENOUS | Status: DC | PRN

## 2023-06-06 MED ORDER — KCL IN DEXTROSE-NACL 20-5-0.45 MEQ/L-%-% IV SOLN
INTRAVENOUS | Status: AC
  Filled 2023-06-06: qty 1000

## 2023-06-06 MED ORDER — HYDROMORPHONE HCL 1 MG/ML IJ SOLN
0.5000 mg | INTRAMUSCULAR | Status: DC | PRN
  Administered 2023-06-06: 0.5 mg via INTRAVENOUS
  Filled 2023-06-06: qty 1

## 2023-06-06 MED ORDER — FLEET ENEMA RE ENEM: 1.0000 | ENEMA | Freq: Once | RECTAL | Status: DC | PRN

## 2023-06-06 MED ORDER — NITROGLYCERIN 0.4 MG SL SUBL: 0.4000 mg | SUBLINGUAL_TABLET | SUBLINGUAL | Status: DC | PRN

## 2023-06-06 MED ORDER — LACTATED RINGERS IV SOLN: INTRAVENOUS | Status: DC

## 2023-06-06 MED ORDER — ACETAMINOPHEN 325 MG PO TABS
325.0000 mg | ORAL_TABLET | Freq: Four times a day (QID) | ORAL | Status: DC | PRN
  Administered 2023-06-08 – 2023-06-09 (×2): 650 mg via ORAL
  Filled 2023-06-06 (×4): qty 2

## 2023-06-06 MED ORDER — MENTHOL 3 MG MT LOZG: 1.0000 | LOZENGE | OROMUCOSAL | Status: DC | PRN

## 2023-06-06 MED ORDER — ALUM & MAG HYDROXIDE-SIMETH 200-200-20 MG/5ML PO SUSP: 30.0000 mL | ORAL | Status: DC | PRN

## 2023-06-06 MED ORDER — LIDOCAINE HCL (PF) 2 % IJ SOLN
INTRAMUSCULAR | Status: AC
  Filled 2023-06-06: qty 5

## 2023-06-06 MED ORDER — ORAL CARE MOUTH RINSE: 15.0000 mL | Freq: Once | OROMUCOSAL | Status: AC

## 2023-06-06 MED ORDER — POVIDONE-IODINE 10 % EX SWAB: 2.0000 | Freq: Once | CUTANEOUS | Status: DC

## 2023-06-06 MED ORDER — METOCLOPRAMIDE HCL 5 MG PO TABS: 5.0000 mg | ORAL_TABLET | Freq: Three times a day (TID) | ORAL | Status: DC | PRN

## 2023-06-06 MED ORDER — TRANEXAMIC ACID 1000 MG/10ML IV SOLN
INTRAVENOUS | Status: DC | PRN
  Administered 2023-06-06: 2000 mg via TOPICAL

## 2023-06-06 MED ORDER — FENTANYL CITRATE (PF) 100 MCG/2ML IJ SOLN
INTRAMUSCULAR | Status: AC
  Filled 2023-06-06: qty 2

## 2023-06-06 MED ORDER — 0.9 % SODIUM CHLORIDE (POUR BTL) OPTIME
TOPICAL | Status: DC | PRN
Start: 2023-06-06 — End: 2023-06-06
  Administered 2023-06-06: 1000 mL

## 2023-06-06 MED ORDER — METHOCARBAMOL 1000 MG/10ML IJ SOLN
500.0000 mg | Freq: Four times a day (QID) | INTRAMUSCULAR | Status: DC | PRN
Start: 2023-06-06 — End: 2023-06-10

## 2023-06-06 MED ORDER — PHENYLEPHRINE HCL-NACL 20-0.9 MG/250ML-% IV SOLN
INTRAVENOUS | Status: AC
  Filled 2023-06-06: qty 250

## 2023-06-06 MED ORDER — TRANEXAMIC ACID-NACL 1000-0.7 MG/100ML-% IV SOLN
1000.0000 mg | INTRAVENOUS | Status: AC
  Administered 2023-06-06: 1000 mg via INTRAVENOUS
  Filled 2023-06-06: qty 100

## 2023-06-06 MED ORDER — SODIUM CHLORIDE (PF) 0.9 % IJ SOLN
INTRAMUSCULAR | Status: AC
  Filled 2023-06-06: qty 50

## 2023-06-06 MED ORDER — PROPOFOL 1000 MG/100ML IV EMUL
INTRAVENOUS | Status: AC
  Filled 2023-06-06: qty 100

## 2023-06-06 MED ORDER — HYDROMORPHONE HCL 2 MG PO TABS
1.0000 mg | ORAL_TABLET | ORAL | Status: DC | PRN
  Administered 2023-06-07 – 2023-06-10 (×4): 2 mg via ORAL
  Filled 2023-06-06 (×4): qty 1

## 2023-06-06 MED ORDER — METOCLOPRAMIDE HCL 5 MG/ML IJ SOLN: 5.0000 mg | Freq: Three times a day (TID) | INTRAMUSCULAR | Status: DC | PRN

## 2023-06-06 MED ORDER — DEXAMETHASONE SODIUM PHOSPHATE 10 MG/ML IJ SOLN
10.0000 mg | Freq: Once | INTRAMUSCULAR | Status: AC
  Administered 2023-06-07: 10 mg via INTRAVENOUS
  Filled 2023-06-06: qty 1

## 2023-06-06 MED ORDER — HYDROCHLOROTHIAZIDE 25 MG PO TABS
25.0000 mg | ORAL_TABLET | Freq: Every day | ORAL | Status: DC
  Administered 2023-06-07 – 2023-06-10 (×4): 25 mg via ORAL
  Filled 2023-06-06 (×4): qty 1

## 2023-06-06 MED ORDER — AMLODIPINE BESYLATE 5 MG PO TABS
5.0000 mg | ORAL_TABLET | Freq: Every day | ORAL | Status: DC
  Administered 2023-06-07 – 2023-06-10 (×4): 5 mg via ORAL
  Filled 2023-06-06 (×4): qty 1

## 2023-06-06 MED ORDER — OXYCODONE HCL 5 MG PO TABS
ORAL_TABLET | ORAL | Status: AC
  Administered 2023-06-06: 5 mg via ORAL
  Filled 2023-06-06: qty 1

## 2023-06-06 MED ORDER — LIDOCAINE HCL (CARDIAC) PF 100 MG/5ML IV SOSY
PREFILLED_SYRINGE | INTRAVENOUS | Status: DC | PRN
  Administered 2023-06-06: 25 mg via INTRAVENOUS

## 2023-06-06 MED ORDER — BENAZEPRIL HCL 20 MG PO TABS
40.0000 mg | ORAL_TABLET | Freq: Every day | ORAL | Status: DC
  Administered 2023-06-07 – 2023-06-09 (×3): 40 mg via ORAL
  Filled 2023-06-06 (×3): qty 2

## 2023-06-06 MED ORDER — NOREPINEPHRINE 4 MG/250ML-% IV SOLN
INTRAVENOUS | Status: DC | PRN
  Administered 2023-06-06: 20 ug/min via INTRAVENOUS

## 2023-06-06 MED ORDER — DIPHENHYDRAMINE HCL 12.5 MG/5ML PO ELIX: 12.5000 mg | ORAL_SOLUTION | ORAL | Status: DC | PRN

## 2023-06-06 MED ORDER — ACETAMINOPHEN 500 MG PO TABS: 1000.0000 mg | ORAL_TABLET | Freq: Once | ORAL | Status: DC | PRN

## 2023-06-06 MED ORDER — BUPIVACAINE-EPINEPHRINE 0.25% -1:200000 IJ SOLN
INTRAMUSCULAR | Status: AC
  Filled 2023-06-06: qty 1

## 2023-06-06 MED ORDER — BUPIVACAINE LIPOSOME 1.3 % IJ SUSP: 10.0000 mL | Freq: Once | INTRAMUSCULAR | Status: DC

## 2023-06-06 MED ORDER — STERILE WATER FOR IRRIGATION IR SOLN
Status: DC | PRN
  Administered 2023-06-06: 1000 mL

## 2023-06-06 MED ORDER — OXYCODONE HCL 5 MG/5ML PO SOLN: 5.0000 mg | Freq: Once | ORAL | Status: AC | PRN

## 2023-06-06 MED ORDER — ISOSORBIDE MONONITRATE ER 60 MG PO TB24
60.0000 mg | ORAL_TABLET | Freq: Every day | ORAL | Status: DC
  Administered 2023-06-07 – 2023-06-10 (×4): 60 mg via ORAL
  Filled 2023-06-06 (×4): qty 1

## 2023-06-06 MED ORDER — CHLORHEXIDINE GLUCONATE 0.12 % MT SOLN
15.0000 mL | Freq: Once | OROMUCOSAL | Status: AC
  Administered 2023-06-06: 15 mL via OROMUCOSAL

## 2023-06-06 MED ORDER — TIZANIDINE HCL 2 MG PO TABS: 2.0000 mg | ORAL_TABLET | Freq: Four times a day (QID) | ORAL | 0 refills | Status: DC | PRN

## 2023-06-06 MED ORDER — PROPOFOL 10 MG/ML IV BOLUS
INTRAVENOUS | Status: DC | PRN
  Administered 2023-06-06: 30 mg via INTRAVENOUS

## 2023-06-06 MED ORDER — TRANEXAMIC ACID 1000 MG/10ML IV SOLN
2000.0000 mg | INTRAVENOUS | Status: DC
  Filled 2023-06-06: qty 20

## 2023-06-06 MED ORDER — TRANEXAMIC ACID-NACL 1000-0.7 MG/100ML-% IV SOLN
1000.0000 mg | Freq: Once | INTRAVENOUS | Status: AC
  Administered 2023-06-06: 1000 mg via INTRAVENOUS
  Filled 2023-06-06: qty 100

## 2023-06-06 MED ORDER — HYDROMORPHONE HCL 2 MG PO TABS: 2.0000 mg | ORAL_TABLET | ORAL | 0 refills | Status: DC | PRN

## 2023-06-06 MED ORDER — POTASSIUM CHLORIDE CRYS ER 10 MEQ PO TBCR
10.0000 meq | EXTENDED_RELEASE_TABLET | Freq: Two times a day (BID) | ORAL | Status: DC
  Administered 2023-06-06 – 2023-06-10 (×8): 10 meq via ORAL
  Filled 2023-06-06 (×8): qty 1

## 2023-06-06 MED ORDER — ACETAMINOPHEN 500 MG PO TABS
ORAL_TABLET | ORAL | Status: AC
  Filled 2023-06-06: qty 2

## 2023-06-06 MED ORDER — ONDANSETRON HCL 4 MG PO TABS: 4.0000 mg | ORAL_TABLET | Freq: Four times a day (QID) | ORAL | Status: DC | PRN

## 2023-06-06 MED ORDER — ALBUMIN HUMAN 5 % IV SOLN: INTRAVENOUS | Status: DC | PRN

## 2023-06-06 MED ORDER — SODIUM CHLORIDE (PF) 0.9 % IJ SOLN
INTRAMUSCULAR | Status: DC | PRN
  Administered 2023-06-06: 90 mL

## 2023-06-06 MED ORDER — DOCUSATE SODIUM 100 MG PO CAPS
100.0000 mg | ORAL_CAPSULE | Freq: Two times a day (BID) | ORAL | Status: DC
  Administered 2023-06-06 – 2023-06-10 (×8): 100 mg via ORAL
  Filled 2023-06-06 (×8): qty 1

## 2023-06-06 MED ORDER — PHENOL 1.4 % MT LIQD: 1.0000 | OROMUCOSAL | Status: DC | PRN

## 2023-06-06 MED ORDER — METHOCARBAMOL 500 MG PO TABS
500.0000 mg | ORAL_TABLET | Freq: Four times a day (QID) | ORAL | Status: DC | PRN
  Administered 2023-06-09 – 2023-06-10 (×2): 500 mg via ORAL
  Filled 2023-06-06 (×2): qty 1

## 2023-06-06 MED ORDER — ACETAMINOPHEN 160 MG/5ML PO SOLN: 1000.0000 mg | Freq: Once | ORAL | Status: DC | PRN

## 2023-06-06 MED ORDER — TRAMADOL HCL 50 MG PO TABS
50.0000 mg | ORAL_TABLET | Freq: Four times a day (QID) | ORAL | Status: DC
  Administered 2023-06-06 – 2023-06-09 (×9): 50 mg via ORAL
  Filled 2023-06-06 (×10): qty 1

## 2023-06-06 MED ORDER — ACETAMINOPHEN 500 MG PO TABS
1000.0000 mg | ORAL_TABLET | Freq: Four times a day (QID) | ORAL | Status: AC
  Administered 2023-06-06 – 2023-06-07 (×4): 1000 mg via ORAL
  Filled 2023-06-06 (×3): qty 2

## 2023-06-06 MED ORDER — FENTANYL CITRATE PF 50 MCG/ML IJ SOSY
PREFILLED_SYRINGE | INTRAMUSCULAR | Status: AC
Start: 2023-06-06 — End: 2023-06-06
  Administered 2023-06-06: 50 ug via INTRAVENOUS
  Filled 2023-06-06: qty 2

## 2023-06-06 MED ORDER — FENTANYL CITRATE PF 50 MCG/ML IJ SOSY
25.0000 ug | PREFILLED_SYRINGE | INTRAMUSCULAR | Status: DC | PRN
  Administered 2023-06-06: 50 ug via INTRAVENOUS

## 2023-06-06 MED ORDER — CEFAZOLIN SODIUM-DEXTROSE 2-4 GM/100ML-% IV SOLN
2.0000 g | INTRAVENOUS | Status: AC
  Administered 2023-06-06: 2 g via INTRAVENOUS
  Filled 2023-06-06: qty 100

## 2023-06-06 MED ORDER — FERROUS SULFATE 325 (65 FE) MG PO TABS
325.0000 mg | ORAL_TABLET | Freq: Every day | ORAL | Status: DC
  Administered 2023-06-07 – 2023-06-10 (×4): 325 mg via ORAL
  Filled 2023-06-06 (×4): qty 1

## 2023-06-06 MED ORDER — GLYCOPYRROLATE 0.2 MG/ML IJ SOLN
INTRAMUSCULAR | Status: AC
  Filled 2023-06-06: qty 1

## 2023-06-06 MED ORDER — FAMOTIDINE 20 MG PO TABS
20.0000 mg | ORAL_TABLET | Freq: Two times a day (BID) | ORAL | Status: DC
Start: 2023-06-06 — End: 2023-06-10
  Administered 2023-06-06 – 2023-06-10 (×8): 20 mg via ORAL
  Filled 2023-06-06 (×8): qty 1

## 2023-06-06 MED ORDER — ONDANSETRON HCL 4 MG/2ML IJ SOLN: 4.0000 mg | Freq: Four times a day (QID) | INTRAMUSCULAR | Status: DC | PRN

## 2023-06-06 MED ORDER — BUPIVACAINE LIPOSOME 1.3 % IJ SUSP
INTRAMUSCULAR | Status: AC
  Filled 2023-06-06: qty 10

## 2023-06-06 MED ORDER — ALBUMIN HUMAN 5 % IV SOLN
INTRAVENOUS | Status: AC
  Filled 2023-06-06: qty 250

## 2023-06-06 MED ORDER — POLYETHYLENE GLYCOL 3350 17 G PO PACK: 17.0000 g | PACK | Freq: Every day | ORAL | Status: DC | PRN

## 2023-06-06 MED ORDER — ASPIRIN 81 MG PO CHEW
81.0000 mg | CHEWABLE_TABLET | Freq: Two times a day (BID) | ORAL | Status: DC
  Administered 2023-06-06 – 2023-06-10 (×8): 81 mg via ORAL
  Filled 2023-06-06 (×8): qty 1

## 2023-06-06 MED ORDER — POLYETHYLENE GLYCOL 3350 17 G PO PACK
17.0000 g | PACK | Freq: Every day | ORAL | Status: DC
  Administered 2023-06-07 – 2023-06-10 (×4): 17 g via ORAL
  Filled 2023-06-06 (×3): qty 1

## 2023-06-06 MED ORDER — BISACODYL 5 MG PO TBEC: 5.0000 mg | DELAYED_RELEASE_TABLET | Freq: Every day | ORAL | Status: DC | PRN

## 2023-06-06 MED ORDER — OXYCODONE HCL 5 MG PO TABS: 5.0000 mg | ORAL_TABLET | Freq: Once | ORAL | Status: AC | PRN

## 2023-06-06 MED ORDER — ASPIRIN 81 MG PO TBEC: 81.0000 mg | DELAYED_RELEASE_TABLET | Freq: Two times a day (BID) | ORAL | 0 refills | Status: AC

## 2023-06-06 MED ORDER — PANTOPRAZOLE SODIUM 40 MG PO TBEC
40.0000 mg | DELAYED_RELEASE_TABLET | Freq: Every day | ORAL | Status: DC
  Administered 2023-06-07 – 2023-06-10 (×4): 40 mg via ORAL
  Filled 2023-06-06 (×4): qty 1

## 2023-06-06 MED ORDER — FENTANYL CITRATE (PF) 100 MCG/2ML IJ SOLN
INTRAMUSCULAR | Status: DC | PRN
  Administered 2023-06-06: 25 ug via INTRAVENOUS
  Administered 2023-06-06 (×2): 12.5 ug via INTRAVENOUS

## 2023-06-06 MED ORDER — BUPIVACAINE IN DEXTROSE 0.75-8.25 % IT SOLN
INTRATHECAL | Status: DC | PRN
  Administered 2023-06-06: 1.8 mL via INTRATHECAL

## 2023-06-06 MED ORDER — GLYCOPYRROLATE 0.2 MG/ML IJ SOLN
INTRAMUSCULAR | Status: DC | PRN
  Administered 2023-06-06: .1 mg via INTRAVENOUS

## 2023-06-06 MED ORDER — PROPOFOL 500 MG/50ML IV EMUL
INTRAVENOUS | Status: DC | PRN
  Administered 2023-06-06: 30 ug/kg/min via INTRAVENOUS

## 2023-06-06 SURGICAL SUPPLY — 38 items
BAG COUNTER SPONGE SURGICOUNT (BAG) ×1 IMPLANT
BAG DECANTER FOR FLEXI CONT (MISCELLANEOUS) ×2 IMPLANT
BALL HIP ARTICU 28 +5 (Hips) IMPLANT
BLADE SAW SGTL 18X1.27X75 (BLADE) ×1 IMPLANT
COVER PERINEAL POST (MISCELLANEOUS) ×1 IMPLANT
COVER SURGICAL LIGHT HANDLE (MISCELLANEOUS) ×1 IMPLANT
CUP ACET PINNACLE SECTR 60MM (Hips) IMPLANT
DRAPE STERI IOBAN 125X83 (DRAPES) ×1 IMPLANT
DRAPE U-SHAPE 47X51 STRL (DRAPES) ×2 IMPLANT
DRSG AQUACEL AG ADV 3.5X10 (GAUZE/BANDAGES/DRESSINGS) ×1 IMPLANT
DURAPREP 26ML APPLICATOR (WOUND CARE) ×1 IMPLANT
ELECT BLADE TIP CTD 4 INCH (ELECTRODE) ×1 IMPLANT
ELECT REM PT RETURN 15FT ADLT (MISCELLANEOUS) ×1 IMPLANT
GLOVE BIO SURGEON STRL SZ7.5 (GLOVE) ×1 IMPLANT
GLOVE BIO SURGEON STRL SZ8.5 (GLOVE) ×1 IMPLANT
GLOVE BIOGEL PI IND STRL 8 (GLOVE) ×1 IMPLANT
GLOVE BIOGEL PI IND STRL 9 (GLOVE) ×1 IMPLANT
GOWN STRL REUS W/ TWL XL LVL3 (GOWN DISPOSABLE) ×2 IMPLANT
HIP BALL ARTICU 28 +5 (Hips) ×1 IMPLANT
HOLDER FOLEY CATH W/STRAP (MISCELLANEOUS) ×1 IMPLANT
INSERT ACETAB BM ALTRX 28X51 (Insert) IMPLANT
KIT TURNOVER KIT A (KITS) IMPLANT
LINER DM PINNACLE 60-51 (Liner) IMPLANT
MANIFOLD NEPTUNE II (INSTRUMENTS) ×1 IMPLANT
NDL HYPO 21X1.5 SAFETY (NEEDLE) ×2 IMPLANT
NEEDLE HYPO 21X1.5 SAFETY (NEEDLE) ×2 IMPLANT
NS IRRIG 1000ML POUR BTL (IV SOLUTION) ×1 IMPLANT
PACK ANTERIOR HIP CUSTOM (KITS) ×1 IMPLANT
PINNSECTOR W/GRIP ACE CUP 60MM (Hips) ×1 IMPLANT
SCREW 6.5MMX30MM (Screw) IMPLANT
SPIKE FLUID TRANSFER (MISCELLANEOUS) ×1 IMPLANT
SUT VIC AB 0 CT1 27XBRD ANBCTR (SUTURE) ×1 IMPLANT
SUT VIC AB 1 CTX36XBRD ANBCTR (SUTURE) ×1 IMPLANT
SUT VIC AB 2-0 CT1 TAPERPNT 27 (SUTURE) ×1 IMPLANT
SUT VICRYL+ 3-0 36IN CT-1 (SUTURE) ×1 IMPLANT
SYR CONTROL 10ML LL (SYRINGE) ×3 IMPLANT
TRAY FOLEY MTR SLVR 16FR STAT (SET/KITS/TRAYS/PACK) IMPLANT
TUBE SUCTION HIGH CAP CLEAR NV (SUCTIONS) ×1 IMPLANT

## 2023-06-06 NOTE — Op Note (Addendum)
PATIENT ID:      Teresa Holloway  MRN:     [id number] DOB/AGE:    [birth year] / 83 y.o.  OPERATIVE REPORT   DATE OF PROCEDURE:  06/06/2023      PREOPERATIVE DIAGNOSIS:  RECURRENT DISLOCATING RIGHT HIP REPLACEMENT                                                         POSTOPERATIVE DIAGNOSIS:  Same                                                         PROCEDURE: Revision right total hip arthroplasty acetabular component with removal of a 56 mm DePuy Pinnacle shell revision to a 16 mm DePuy GRIPTION sector shell with 1 superior 30 mm dome screw, dual mobility liner to accept a +5 x 28 mm metal in her head, 51 mm dual mobility head.  SURGEON: Nestor Lewandowsky  ASSISTANT:   Tomi Likens. Gaylene Brooks  (present throughout entire procedure and necessary for timely completion of the procedure)   ANESTHESIA: Spinal, Exparel 133mg  injection BLOOD LOSS: 500 cc FLUID REPLACEMENT: 2000 cc crystalloid TRANEXAMIC ACID: 1gm IV, 2gm Topical COMPLICATIONS: none  Specimens: Clear synovial fluid sent for Gram stain and culture it appeared benign.   INDICATIONS FOR PROCEDURE: A 83 y.o. year-old With  RECURRENT DISLOCATING RIGHT HIP REPLACEMENT.  Patient had standard anterior right total hip arthroplasty for a femoral neck fracture on 12/18/2022.  Did well until 04/15/2023 when she sustained an anterior dislocation standing in the kitchen doing dishes when she turned and pivoted to the left.  She underwent closed reduction did well for another month until she dislocated again in a standing position doing dishes.  Radiographs open [facility] showed anterior dislocation.  Her postoperative films on C arm showed that her abduction angle was 45 degrees and anteversion was 20 degrees.  On her last set of films from [facility] the abduction angle measured 60 degrees and anteversion was 30 degrees.  I was concerned that the cup may have moved although it appeared to be ingrown.  In any event she is dislocating and  after failing conservative treatment for the last 6 weeks she desires elective acetabular revision at which time we will remove the shell that is in place and put in a new shell again in 45 degrees of abduction and 15 to 20 degrees of anteversion along with a dual mobility bearing system.The risks, benefits, and alternatives were discussed at length including but not limited to the risks of infection, bleeding, nerve injury, stiffness, blood clots, the need for revision surgery, cardiopulmonary complications, among others, and they were willing to proceed. Questions answered      PROCEDURE IN DETAIL: The patient was identified by armband, received preoperative IV antibiotics, in the holding area at [facility], taken to the operating room , appropriate anesthetic monitors were attached and anesthesia was induced with the patient on the gurney. HANA boots were applied to the feet, and the patient  was transferred to the HANA table with a peroneal post and support underneath the non-operative leg.  The C-arm was brought in for preoperative films and again showed good position of the acetabular component.  With a spinal on board and the patient completely relaxed I applied hard traction external rotation to her right hip and I could get the hip to partially sublux but I could not get it to dislocate all the way anteriorly.  The operative lower extremity was then prepped and draped in the usual sterile fashion from just above the iliac crest to the knee. And a timeout procedure was performed. Tomi Likens. Vear Clock [facility] was present and scrubbed throughout the case, critical for assistance with, positioning, exposure, retraction, instrumentation, and closure.Skin along incision area was injected with 10 cc of Exparel solution. We then made a 15 cm incision along the interval at the leading edge of the tensor fascia lata of starting at 2 cm lateral to the ASIS. Small bleeders in the skin and subcutaneous tissue  identified and cauterized we dissected down to the fascia and made an incision in the fascia allowing [place] to elevate the fascia of the tensor muscle and exploited the interval between the rectus and the tensor fascia lata. A Cobra retractor was then placed along the superior neck of the femur. A cerebellar retractor was used to expose the interval between the tensor fascia lata and the rectus femoris.  Scar tissue was removed anteriorly allowing [place] to visualize the anterior aspect of the acetabular component and femoral component that it did appear to be well-fixed.  The synovial pleural fluid appeared to be clear and was sent for Gram stain and culture.  Extensive scar tissue was removed superiorly medially as well as posteriorly and anteriorly allowing [place] to place a small Hohmann retractor anteriorly, a medium Hohmann in the cotyloid notch region and a posterior small Cobra.  Traction was applied and using a metal cylinder the metal ball was dislodged from the trunnion the hip was then dislocated anteriorly rotated allowing removal of the hip ball.  Traction was released and the trunnion was placed behind the acetabulum using the posterior lateral Cobra.  We remove scar tissue from around the rim of the acetabular component which actually appeared to be in relatively good position but because of the anterior dislocations we went ahead and removed it with a 56 mm Intermed curved cup cutting asked osteotome.  Once the cup had been removed we then reamed up the acetabulum to 59 mm and hammered into place a 60 mm DePuy GRIPTION sector cup and 10 degrees of anteversion and 40 degrees of abduction.  A single dome screw was placed for fixation.  We then inserted the dual mobility liner.  Soft tissue was injected with Exparel solution.  A +5 x 28 mm metal ball was pressed into a 51 mm dual mobility head and then hammered onto the trunnion.  The hip was reduced and noted to be very stable.  It could not be dislocated  anteriorly without significant traction external rotation and a bone hook.  The soft tissues are then hand lavaged using normal saline solution and a bulb syringe.  Final C-arm images were obtained. The wound was thoroughly irrigated with normal saline solution.  The fascia between the rectus femoris and the tensor was repaired with 0 Vicryl suture.. the subcutaneous and subcuticular layers were closed with running 3-0 Vicryl suture followed by an Aquacil dressing. At this point the patient was awaken and transferred to hospital gurney without difficulty.   Nestor Lewandowsky 06/06/2023, 1:03 PM

## 2023-06-06 NOTE — Transfer of Care (Signed)
Immediate Anesthesia Transfer of Care Note  Patient: Teresa Holloway  Procedure(s) Performed: TOTAL HIP ARTHROPLASTY ANTERIOR APPROACH (Right: Hip)  Patient Location: PACU  Anesthesia Type spinal  Level of Consciousness: awake, alert , and oriented  Airway & Oxygen Therapy: Patient Spontanous Breathing  Post-op Assessment: Report given to RN and Post -op Vital signs reviewed and stable  Post vital signs: Reviewed and stable  Last Vitals:  Vitals Value Taken Time  BP 150/131 06/06/23 1554  Temp    Pulse 94 06/06/23 1556  Resp 9 06/06/23 1556  SpO2 94 % 06/06/23 1556  Vitals shown include unfiled device data.  Last Pain:  Vitals:   06/06/23 0956  PainSc: 0-No pain         Complications: No notable events documented.

## 2023-06-06 NOTE — Anesthesia Preprocedure Evaluation (Signed)
Anesthesia Evaluation  Patient identified by MRN, date of birth, ID band Patient awake    Reviewed: Allergy & Precautions, NPO status , Patient's Chart, lab work & pertinent test results, reviewed documented beta blocker date and time   History of Anesthesia Complications Negative for: history of anesthetic complications  Airway Mallampati: III  TM Distance: >3 FB Neck ROM: Full    Dental  (+) Edentulous Upper, Upper Dentures, Dental Advisory Given   Pulmonary neg pulmonary ROS   Pulmonary exam normal breath sounds clear to auscultation       Cardiovascular hypertension, Pt. on medications and Pt. on home beta blockers + CAD and + Past MI  Normal cardiovascular exam Rhythm:Regular Rate:Normal   Teresa Holloway is an 83 yo female who presents to PAT prior to R THA on 02/17/23 with Dr. Turner Daniels. PMH of CAD, hx of MI, HTN, HLD, GERD, anxiety, depression, arthritis.   Patient follows with Cardiology for hx of mild, diffuse CAD by cath not amenable to PCI, NSTEMI in 2004 and 2007. Last seen in clinic on 02/26/2022. Perlast OV note patient is doing well from cardiac standpoint. "Reserve invasive cardiovascular strategy only if symptoms of angina and/or signs of coronary ischemia develop in spite of optimal medical therapy. Discussed with patient - she appears to understand and agree to the plan as outlined." Cardiac clearance received 12/30/22:    "Teresa Holloway is having a non-cardiac surgical procedure for RIGHT TOTAL HIP REPLACEMENT and requires risk stratification.   Clinical risk factors (1 point each) Patient has: -CAD: Yes -STROKE/TIA: No -CHF (compensated or prior): No -CKD (Cr >2): No -Insulin dependent DM: No -High risk surgery (any vascular, abdominal or thoracic): No  RCRI score: 1. Intermediate Risk   Functional capacity: NM Stress 09/23/2018 negative    LVEF none  No further cardiac testing is warranted.  Patient should continue betablocker through surgery as clinically able if applicable."      Neuro/Psych  PSYCHIATRIC DISORDERS Anxiety Depression    negative neurological ROS     GI/Hepatic Neg liver ROS,GERD  ,,  Endo/Other  negative endocrine ROS    Renal/GU negative Renal ROS  negative genitourinary   Musculoskeletal  (+) Arthritis ,    Abdominal   Peds  Hematology  (+) Blood dyscrasia, anemia Lab Results      Component                Value               Date                      WBC                      4.9                 06/02/2023                HGB                      10.1 (L)            06/02/2023                HCT                      32.4 (L)            06/02/2023  MCV                      93.1                06/02/2023                PLT                      219                 06/02/2023              Anesthesia Other Findings   Reproductive/Obstetrics                              Anesthesia Physical Anesthesia Plan  ASA: 3  Anesthesia Plan: Spinal   Post-op Pain Management: Tylenol PO (pre-op)*   Induction:   PONV Risk Score and Plan: 2 and Treatment may vary due to age or medical condition, Propofol infusion and Ondansetron  Airway Management Planned: Natural Airway, Nasal Cannula and Simple Face Mask  Additional Equipment: None  Intra-op Plan:   Post-operative Plan:   Informed Consent: I have reviewed the patients History and Physical, chart, labs and discussed the procedure including the risks, benefits and alternatives for the proposed anesthesia with the patient or authorized representative who has indicated his/her understanding and acceptance.     Dental advisory given  Plan Discussed with: CRNA  Anesthesia Plan Comments:         Anesthesia Quick Evaluation

## 2023-06-06 NOTE — Interval H&P Note (Signed)
History and Physical Interval Note:  06/06/2023 1:02 PM  Teresa Holloway  has presented today for surgery, with the diagnosis of RECURRENT DISLOCATING RIGHT HIP REPLACEMENT.  The various methods of treatment have been discussed with the patient and family. After consideration of risks, benefits and other options for treatment, the patient has consented to  Procedure(s): TOTAL HIP ARTHROPLASTY ANTERIOR APPROACH (Right) as a surgical intervention.  The patient's history has been reviewed, patient examined, no change in status, stable for surgery.  I have reviewed the patient's chart and labs.  Questions were answered to the patient's satisfaction.     Nestor Lewandowsky

## 2023-06-06 NOTE — Anesthesia Procedure Notes (Signed)
Procedure Name: MAC Date/Time: 06/06/2023 1:44 PM  Performed by: Floydene Flock, CRNAPre-anesthesia Checklist: Patient identified, Emergency Drugs available, Suction available, Patient being monitored and Timeout performed Patient Re-evaluated:Patient Re-evaluated prior to induction Oxygen Delivery Method: Simple face mask Preoxygenation: Pre-oxygenation with 100% oxygen Placement Confirmation: positive ETCO2

## 2023-06-06 NOTE — Anesthesia Procedure Notes (Signed)
Spinal  Patient location during procedure: OR Start time: 06/06/2023 1:38 PM End time: 06/06/2023 1:44 PM Reason for block: surgical anesthesia Staffing Performed: anesthesiologist  Anesthesiologist: Val Eagle, MD Performed by: Val Eagle, MD Authorized by: Val Eagle, MD   Preanesthetic Checklist Completed: patient identified, IV checked, risks and benefits discussed, surgical consent, monitors and equipment checked, pre-op evaluation and timeout performed Spinal Block Patient position: sitting Prep: DuraPrep Patient monitoring: heart rate, cardiac monitor, continuous pulse ox and blood pressure Approach: midline Location: L3-4 Injection technique: single-shot Needle Needle type: Pencan  Needle gauge: 24 G Needle length: 9 cm Assessment Sensory level: T6 Events: CSF return

## 2023-06-06 NOTE — Discharge Instructions (Signed)

## 2023-06-07 NOTE — Progress Notes (Signed)
Physical Therapy Treatment Patient Details Name: Teresa Holloway MRN: [id number] DOB: 15-Jul-1940 Today's Date: 06/07/2023   History of Present Illness 83 yo female s/p R anterior THA revision. PMH: TKA, MI, HTN, chronic back pain    PT Comments  Pt making steady progress with mobility. Continues to be oriented only to self, thinks she is in [place]; able to follow one step commands however presents with decr safety awareness, decr STM (no recall of am PT session, if she has eaten today etc). Pt family member in room endorses pt does have some confusion at baseline however reports she is currently much  more confused. Continue PT    If plan is discharge home, recommend the following: A little help with walking and/or transfers;A little help with bathing/dressing/bathroom;Assistance with cooking/housework;Help with stairs or ramp for entrance;Assist for transportation   Can travel by private vehicle        Equipment Recommendations  None recommended by PT    Recommendations for Other Services       Precautions / Restrictions Precautions Precautions: Anterior Hip Recall of Precautions/Restrictions: Impaired Restrictions Weight Bearing Restrictions Per Provider Order: No RLE Weight Bearing Per Provider Order: Weight bearing as tolerated     Mobility  Bed Mobility Overal bed mobility: Needs Assistance Bed Mobility: Sit to Supine     Supine to sit: Min assist, Mod assist Sit to supine: Min assist   General bed mobility comments: incr time, cues to avoid abduction. assist with RLE    Transfers Overall transfer level: Needs assistance Equipment used: Rolling walker (2 wheels) Transfers: Sit to/from Stand Sit to Stand: Min assist           General transfer comment: cues for hand placement, assist to rise and transition to RW    Ambulation/Gait Ambulation/Gait assistance: Min assist, Contact guard assist Gait Distance (Feet): 75 Feet Assistive device: Rolling  walker (2 wheels) Gait Pattern/deviations: Step-to pattern, Step-through pattern       General Gait Details: cues for sequence, trunk extension and RW proximity   Stairs             Wheelchair Mobility     Tilt Bed    Modified Rankin (Stroke Patients Only)       Balance Overall balance assessment: Mild deficits observed, not formally tested                                          Communication Communication Communication: Impaired Factors Affecting Communication: Hearing impaired  Cognition Arousal: Alert Behavior During Therapy: WFL for tasks assessed/performed   PT - Cognitive impairments: Safety/Judgement, Orientation, Memory   Orientation impairments: Place, Situation                   PT - Cognition Comments: follows one step commands consistently; decr STM, decr awareness deficits Following commands: Intact      Cueing Cueing Techniques: Verbal cues  Exercises      General Comments        Pertinent Vitals/Pain Pain Assessment Pain Assessment: 0-10 Pain Score: 4  Pain Location: right hip Pain Descriptors / Indicators: Discomfort, Sore, Grimacing Pain Intervention(s): Limited activity within patient's tolerance, Monitored during session, Repositioned    Home Living Family/patient expects to be discharged to:: Private residence Living Arrangements: Alone Available Help at Discharge: Family Type of Home: Mobile home Home Access: Stairs to enter Entrance Stairs-Rails: Right;Left;Can  reach both Entrance Stairs-Number of Steps: 4   Home Layout: One level Home Equipment: Cane - quad;Cane - single Librarian, academic (2 wheels);Rollator (4 wheels) Additional Comments: son stays a few nights a week.    Prior Function            PT Goals (current goals can now be found in the care plan section) Acute Rehab PT Goals PT Goal Formulation: With patient Time For Goal Achievement: 06/14/23 Potential to Achieve Goals:  Good Progress towards PT goals: Progressing toward goals    Frequency    7X/week      PT Plan      Co-evaluation              AM-PAC PT "6 Clicks" Mobility   Outcome Measure  Help needed turning from your back to your side while in a flat bed without using bedrails?: A Little Help needed moving from lying on your back to sitting on the side of a flat bed without using bedrails?: A Little Help needed moving to and from a bed to a chair (including a wheelchair)?: A Little Help needed standing up from a chair using your arms (e.g., wheelchair or bedside chair)?: A Little Help needed to walk in hospital room?: A Little Help needed climbing 3-5 steps with a railing? : A Little 6 Click Score: 18    End of Session Equipment Utilized During Treatment: Gait belt Activity Tolerance: Patient tolerated treatment well Patient left: with call bell/phone within reach;in bed;with bed alarm set;with family/visitor present Nurse Communication: Mobility status PT Visit Diagnosis: Other abnormalities of gait and mobility (R26.89)     Time: 1610-9604 PT Time Calculation (min) (ACUTE ONLY): 18 min  Charges:    $Gait Training: 8-22 mins PT General Charges $$ ACUTE PT VISIT: 1 Visit                     Chrysta Fulcher, PT  Acute Rehab Dept [facility]) [phone]  06/07/2023    [place] [facility] 06/07/2023, 2:45 PM

## 2023-06-07 NOTE — Evaluation (Signed)
Physical Therapy Evaluation Patient Details Name: Teresa Holloway MRN: [id number] DOB: 1941-04-15 Today's Date: 06/07/2023  History of Present Illness  83 yo female s/p R anterior THA revision. PMH: TKA, MI, HTN, chronic back pain  Clinical Impression  Pt is s/p THA resulting in the deficits listed below (see PT Problem List).  Pt amb 45' with RW and min assist. Anticipate steady progress in acute setting  Pt will benefit from acute skilled PT to increase their independence and safety with mobility to facilitate discharge.          If plan is discharge home, recommend the following: A little help with walking and/or transfers;A little help with bathing/dressing/bathroom;Assistance with cooking/housework;Help with stairs or ramp for entrance;Assist for transportation   Can travel by private vehicle        Equipment Recommendations None recommended by PT  Recommendations for Other Services       Functional Status Assessment Patient has had a recent decline in their functional status and demonstrates the ability to make significant improvements in function in a reasonable and predictable amount of time.     Precautions / Restrictions Precautions Precautions: Anterior Hip Restrictions Weight Bearing Restrictions Per Provider Order: No RLE Weight Bearing Per Provider Order: Weight bearing as tolerated      Mobility  Bed Mobility Overal bed mobility: Needs Assistance Bed Mobility: Supine to Sit     Supine to sit: Min assist, Mod assist     General bed mobility comments: incr time, cues to avoid abduction. assist with RLE,  pad used to assist scooting fwd once in sitting    Transfers Overall transfer level: Needs assistance Equipment used: Rolling walker (2 wheels) Transfers: Sit to/from Stand Sit to Stand: Min assist           General transfer comment: cues for hand placement, assist to rise and transition to RW    Ambulation/Gait Ambulation/Gait assistance:  Min assist Gait Distance (Feet): 45 Feet Assistive device: Rolling walker (2 wheels) Gait Pattern/deviations: Step-to pattern, Step-through pattern       General Gait Details: cues for sequence and RW proximity  Stairs            Wheelchair Mobility     Tilt Bed    Modified Rankin (Stroke Patients Only)       Balance Overall balance assessment: Mild deficits observed, not formally tested                                           Pertinent Vitals/Pain Pain Assessment Pain Assessment: 0-10 Pain Score: 5  Pain Location: right hip Pain Descriptors / Indicators: Discomfort, Sore Pain Intervention(s): Limited activity within patient's tolerance, Monitored during session, Premedicated before session, Ice applied    Home Living Family/patient expects to be discharged to:: Private residence Living Arrangements: Alone Available Help at Discharge: Family Type of Home: Mobile home Home Access: Stairs to enter Entrance Stairs-Rails: Right;Left;Can reach both Entrance Stairs-Number of Steps: 4   Home Layout: One level Home Equipment: Cane - quad;Cane - single Librarian, academic (2 wheels);Rollator (4 wheels) Additional Comments: son stays a few nights a week.    Prior Function               Mobility Comments: ind       Extremity/Trunk Assessment   Upper Extremity Assessment Upper Extremity Assessment: Defer to OT evaluation  Lower Extremity Assessment Lower Extremity Assessment: RLE deficits/detail RLE Deficits / Details: anticipated post op deficits; ankle WFL, knee and hip 2+/5-not fully tested d/t pain       Communication   Communication Communication: Impaired Factors Affecting Communication: Hearing impaired    Cognition Arousal: Alert Behavior During Therapy: WFL for tasks assessed/performed   PT - Cognitive impairments: Orientation   Orientation impairments: Place                   PT - Cognition Comments:  follows one step commands consistently Following commands: Intact       Cueing Cueing Techniques: Verbal cues     General Comments      Exercises     Assessment/Plan    PT Assessment Patient needs continued PT services  PT Problem List Decreased strength;Decreased range of motion;Decreased activity tolerance;Decreased balance;Decreased knowledge of use of DME;Decreased mobility;Pain       PT Treatment Interventions DME instruction;Gait training;Stair training;Functional mobility training;Therapeutic activities;Patient/family education;Therapeutic exercise    PT Goals (Current goals can be found in the Care Plan section)  Acute Rehab PT Goals PT Goal Formulation: With patient Time For Goal Achievement: 06/14/23 Potential to Achieve Goals: Good    Frequency 7X/week     Co-evaluation               AM-PAC PT "6 Clicks" Mobility  Outcome Measure Help needed turning from your back to your side while in a flat bed without using bedrails?: A Little Help needed moving from lying on your back to sitting on the side of a flat bed without using bedrails?: A Little Help needed moving to and from a bed to a chair (including a wheelchair)?: A Little Help needed standing up from a chair using your arms (e.g., wheelchair or bedside chair)?: A Little Help needed to walk in hospital room?: A Little Help needed climbing 3-5 steps with a railing? : A Little 6 Click Score: 18    End of Session Equipment Utilized During Treatment: Gait belt Activity Tolerance: Patient tolerated treatment well Patient left: in chair;with call bell/phone within reach;with chair alarm set   PT Visit Diagnosis: Other abnormalities of gait and mobility (R26.89)    Time: 3244-0102 PT Time Calculation (min) (ACUTE ONLY): 17 min   Charges:   PT Evaluation $PT Eval Low Complexity: 1 Low   PT General Charges $$ ACUTE PT VISIT: 1 Visit         Rayce Brahmbhatt, PT  Acute Rehab Dept [facility])  [phone]  06/07/2023   [facility] 06/07/2023, 11:40 AM

## 2023-06-07 NOTE — TOC Transition Note (Signed)
Transition of Care [facility]) - Discharge Note   Patient Details  Name: Teresa Holloway MRN: [id number] Date of Birth: Mar 01, 1941  Transition of [facility]) CM/SW Contact:  Amada Jupiter, LCSW Phone Number: 06/07/2023, 12:53 PM   Clinical Narrative:    Met with pt who confirms she has needed DME in the home.  OPPT already arranged with [facility].  No further TOC needs.   Final next level of care: OP Rehab Barriers to Discharge: No Barriers Identified   Patient Goals and CMS Choice Patient states their goals for this hospitalization and ongoing recovery are:: return home          Discharge Placement                       Discharge Plan and Services Additional resources added to the After Visit Summary for                  DME Arranged: N/A DME Agency: NA                  Social Drivers of Health (SDOH) Interventions SDOH Screenings   Food Insecurity: No Food Insecurity (06/07/2023)  Housing: Low Risk  (06/07/2023)  Transportation Needs: No Transportation Needs (06/07/2023)  Utilities: Not At Risk (06/07/2023)  Alcohol Screen: Low Risk  (08/21/2022)  Depression (PHQ2-9): Low Risk  (04/24/2023)  Recent Concern: Depression (PHQ2-9) - Medium Risk (02/11/2023)  Financial Resource Strain: Low Risk  (05/08/2023)  Physical Activity: Sufficiently Active (08/21/2022)  Social Connections: Moderately Isolated (06/07/2023)  Stress: No Stress Concern Present (08/21/2022)  Tobacco Use: Low Risk  (06/06/2023)     Readmission Risk Interventions    06/07/2023   12:52 PM  Readmission Risk Prevention Plan  Transportation Screening Complete  PCP or Specialist Appt within 3-5 Days Complete  HRI or Home Care Consult Complete  Social Work Consult for Recovery Care Planning/Counseling Complete  Palliative Care Screening Not Applicable  Medication Review [employer]) Complete

## 2023-06-07 NOTE — Progress Notes (Signed)
.  Subjective: 1 Day Post-Op Procedure(s) (LRB): TOTAL HIP ARTHROPLASTY ANTERIOR APPROACH (Right) Patient is alert but only oriented to self. She believes she is in [place] No Nausea, no Vomiting, yes passing gas. Taking PO well. Denies SOB, Chest or Calf Pain. Using Incentive Spirometer, PAS in place. Ambulate WBAT with pt. Walked 75 ft with therapy, Patient reports pain as mild.    Objective: Vital signs in last 24 hours: Temp:  [97.8 F (36.6 C)-98.8 F (37.1 C)] 98.2 F (36.8 C) (02/15 1545) Pulse Rate:  [68-93] 87 (02/15 1545) Resp:  [0-26] 17 (02/15 1545) BP: (89-122)/(46-69) 100/58 (02/15 1545) SpO2:  [90 %-97 %] 94 % (02/15 1545)  Labs: No results for input(s): "HGB" in the last 72 hours. No results for input(s): "WBC", "RBC", "HCT", "PLT" in the last 72 hours. No results for input(s): "NA", "K", "CL", "CO2", "BUN", "CREATININE", "GLUCOSE", "CALCIUM" in the last 72 hours. No results for input(s): "LABPT", "INR" in the last 72 hours.  Physical Exam:  Neurologically slightly confused, oriented to self Neurovascular intact Sensation intact distally Intact pulses distally Dorsiflexion/Plantar flexion intact Incision: dressing C/D/I Compartment soft  Assessment/Plan:  1 Day Post-Op Procedure(s) (LRB): TOTAL HIP ARTHROPLASTY ANTERIOR APPROACH (Right) ADDITIONAL DIAGNOSIS: Anemia, CAD with history of MI, HTN, HLD, GERD Anticipated LOS equal to or greater than 2 midnights due to - Age 82 and older with one or more of the following:             - Expected need for hospital services (PT, OT, Nursing) required for safe   discharge             - Anticipated need for postoperative skilled nursing care or inpatient rehab  PLAN PT/OT WBAT, AROM and PROM  DVT Prophylaxis:  SCDx72hrs, ASA 81 mg BID DISCHARGE PLAN: Home vs SNF   Luci Bank 06/07/2023, 3:56 PM

## 2023-06-07 NOTE — Plan of Care (Signed)
  Problem: Education: Goal: Knowledge of General Education information will improve Description: Including pain rating scale, medication(s)/side effects and non-pharmacologic comfort measures Outcome: Progressing   Problem: Health Behavior/Discharge Planning: Goal: Ability to manage health-related needs will improve Outcome: Progressing   Problem: Clinical Measurements: Goal: Ability to maintain clinical measurements within normal limits will improve Outcome: Progressing Goal: Will remain free from infection Outcome: Progressing Goal: Diagnostic test results will improve Outcome: Progressing Goal: Respiratory complications will improve Outcome: Progressing Goal: Cardiovascular complication will be avoided Outcome: Progressing   Problem: Activity: Goal: Risk for activity intolerance will decrease Outcome: Progressing   Problem: Nutrition: Goal: Adequate nutrition will be maintained Outcome: Progressing   Problem: Coping: Goal: Level of anxiety will decrease Outcome: Progressing   Problem: Elimination: Goal: Will not experience complications related to bowel motility Outcome: Progressing Goal: Will not experience complications related to urinary retention Outcome: Progressing   Problem: Pain Managment: Goal: General experience of comfort will improve and/or be controlled Outcome: Progressing   Problem: Safety: Goal: Ability to remain free from injury will improve Outcome: Progressing   Problem: Skin Integrity: Goal: Risk for impaired skin integrity will decrease Outcome: Progressing   Problem: Education: Goal: Knowledge of the prescribed therapeutic regimen will improve Outcome: Progressing Goal: Understanding of discharge needs will improve Outcome: Progressing   Problem: Clinical Measurements: Goal: Postoperative complications will be avoided or minimized Outcome: Progressing   Problem: Pain Management: Goal: Pain level will decrease with appropriate  interventions Outcome: Progressing   Problem: Skin Integrity: Goal: Will show signs of wound healing Outcome: Progressing   Problem: Activity: Goal: Ability to avoid complications of mobility impairment will improve Outcome: Not Progressing Goal: Ability to tolerate increased activity will improve Outcome: Not Progressing

## 2023-06-08 NOTE — Progress Notes (Signed)
PT [place] NOTE   06/08/23 1600  PT Visit Information  Last PT Received On 06/08/23  Assistance Needed Progressing toward goals. Pt continues to be pleasantly confused and cooperative; agreeable to bed level exercises  History of Present Illness 83 yo female s/p R anterior THA revision. PMH: TKA, MI, HTN, chronic back pain  Precautions  Precautions Anterior Hip  Recall of Precautions/Restrictions Impaired  Precaution/Restrictions Comments reviewed anterior precautions with pt and family member; family verbalizes understanding  Restrictions  RLE Weight Bearing Per Provider Order WBAT  Pain Assessment  Pain Assessment Faces  Faces Pain Scale 6  Pain Location right hip  Pain Descriptors / Indicators Discomfort;Sore;Grimacing  Pain Intervention(s) Limited activity within patient's tolerance;Monitored during session;Repositioned  Cognition  Arousal Alert  Behavior During Therapy WFL for tasks assessed/performed  PT - Cognitive impairments Safety/Judgement;Orientation;Memory  Orientation impairments Place;Time;Situation  PT - Cognition Comments follows one step commands consistently; decr STM, decr awareness deficits; pt states she is about to do a friend's hair and they won't pay for it  Following Commands  Following commands Intact  Cueing  Cueing Techniques Verbal cues  Total Joint Exercises  Ankle Circles/Pumps AROM;Both;15 reps  Quad Sets AROM;Strengthening;Both;10 reps  Gluteal Sets AROM;Strengthening;10 reps  Short Arc Quad AROM;AAROM;Both;10 reps  Heel Slides AROM;Strengthening;Both;10 reps  PT - End of Session  Activity Tolerance Patient tolerated treatment well  Patient left with call bell/phone within reach;in bed;with bed alarm set   PT - Assessment/Plan  PT Visit Diagnosis Other abnormalities of gait and mobility (R26.89)  PT Frequency (ACUTE ONLY) 7X/week  Follow Up Recommendations Follow physician's recommendations for discharge plan and follow up therapies  Patient can  return home with the following A little help with walking and/or transfers;A little help with bathing/dressing/bathroom;Assistance with cooking/housework;Help with stairs or ramp for entrance;Assist for transportation  PT equipment None recommended by PT  AM-PAC PT "6 Clicks" Mobility Outcome Measure (Version 2)  Help needed turning from your back to your side while in a flat bed without using bedrails? 3  Help needed moving from lying on your back to sitting on the side of a flat bed without using bedrails? 3  Help needed moving to and from a bed to a chair (including a wheelchair)? 3  Help needed standing up from a chair using your arms (e.g., wheelchair or bedside chair)? 3  Help needed to walk in hospital room? 3  Help needed climbing 3-5 steps with a railing?  3  6 Click Score 18  Consider Recommendation of Discharge To: Home with [facility]  PT Goal Progression  Progress towards PT goals Progressing toward goals  Acute Rehab PT Goals  PT Goal Formulation With patient  Time For Goal Achievement 06/14/23  Potential to Achieve Goals Good  PT Time Calculation  PT Start Time (ACUTE ONLY) 1530  PT Stop Time (ACUTE ONLY) 1546  PT Time Calculation (min) (ACUTE ONLY) 16 min  PT General Charges  $$ ACUTE PT VISIT 1 Visit  PT Treatments  $Therapeutic Exercise 8-22 mins

## 2023-06-08 NOTE — Progress Notes (Signed)
Physical Therapy Treatment Patient Details Name: Teresa Holloway MRN: [id number] DOB: [birth year] Today's Date: 06/08/2023   History of Present Illness 83 yo female s/p R anterior THA revision. PMH: TKA, MI, HTN, chronic back pain    PT Comments  Pt alert, pleasantly confused. Follows one step commands consistently. Pt does not recall anterior hip precautions, reviewed with family in room. Pt with incr activity tolerance and able to amb ~ 140' with RW and min to CGA for balance and safety.  Continue PT in acute setting    If plan is discharge home, recommend the following: A little help with walking and/or transfers;A little help with bathing/dressing/bathroom;Assistance with cooking/housework;Help with stairs or ramp for entrance;Assist for transportation   Can travel by private vehicle        Equipment Recommendations  None recommended by PT    Recommendations for Other Services       Precautions / Restrictions Precautions Precautions: Anterior Hip Recall of Precautions/Restrictions: Impaired Precaution/Restrictions Comments: reviewed anterior precautions with pt and family member; family verbalizes understanding Restrictions RLE Weight Bearing Per Provider Order: Weight bearing as tolerated     Mobility  Bed Mobility Overal bed mobility: Needs Assistance Bed Mobility: Supine to Sit     Supine to sit: Min assist     General bed mobility comments: incr time, cues to avoid abduction. assist with RLE    Transfers Overall transfer level: Needs assistance Equipment used: Rolling walker (2 wheels) Transfers: Sit to/from Stand Sit to Stand: Min assist           General transfer comment: cues for hand placement, assist to rise and transition to RW    Ambulation/Gait Ambulation/Gait assistance: Min assist, Contact guard assist Gait Distance (Feet): 140 Feet Assistive device: Rolling walker (2 wheels) Gait Pattern/deviations: Step-to pattern, Step-through pattern        General Gait Details: cues for sequence, trunk extension and RW proximity. intermittent drifiting to L, pt able to correct with cues and light assist to steady   Stairs             Wheelchair Mobility     Tilt Bed    Modified Rankin (Stroke Patients Only)       Balance Overall balance assessment: Mild deficits observed, not formally tested                                          Communication Communication Communication: Impaired Factors Affecting Communication: Hearing impaired  Cognition Arousal: Alert Behavior During Therapy: WFL for tasks assessed/performed   PT - Cognitive impairments: Safety/Judgement, Orientation, Memory   Orientation impairments: Place, Situation                   PT - Cognition Comments: follows one step commands consistently; decr STM, decr awareness deficits Following commands: Intact      Cueing Cueing Techniques: Verbal cues  Exercises Total Joint Exercises Ankle Circles/Pumps: AROM, Both, 15 reps    General Comments        Pertinent Vitals/Pain Pain Assessment Pain Assessment: Faces Faces Pain Scale: Hurts even more Pain Location: right hip Pain Descriptors / Indicators: Discomfort, Sore, Grimacing Pain Intervention(s): Limited activity within patient's tolerance, Monitored during session, Premedicated before session, Repositioned    Home Living  Prior Function            PT Goals (current goals can now be found in the care plan section) Acute Rehab PT Goals PT Goal Formulation: With patient Time For Goal Achievement: 06/14/23 Potential to Achieve Goals: Good Progress towards PT goals: Progressing toward goals    Frequency    7X/week      PT Plan      Co-evaluation              AM-PAC PT "6 Clicks" Mobility   Outcome Measure  Help needed turning from your back to your side while in a flat bed without using bedrails?: A  Little Help needed moving from lying on your back to sitting on the side of a flat bed without using bedrails?: A Little Help needed moving to and from a bed to a chair (including a wheelchair)?: A Little Help needed standing up from a chair using your arms (e.g., wheelchair or bedside chair)?: A Little Help needed to walk in hospital room?: A Little Help needed climbing 3-5 steps with a railing? : A Little 6 Click Score: 18    End of Session Equipment Utilized During Treatment: Gait belt Activity Tolerance: Patient tolerated treatment well Patient left: with call bell/phone within reach;with family/visitor present;in chair;with chair alarm set Nurse Communication: Mobility status PT Visit Diagnosis: Other abnormalities of gait and mobility (R26.89)     Time: 1610-9604 PT Time Calculation (min) (ACUTE ONLY): 13 min  Charges:    $Gait Training: 8-22 mins PT General Charges $$ ACUTE PT VISIT: 1 Visit                     Katoya Amato, PT  Acute Rehab Dept ([facility]) [phone]  06/08/2023    [facility] 06/08/2023, 1:02 PM

## 2023-06-08 NOTE — Anesthesia Postprocedure Evaluation (Signed)
Anesthesia Post Note  Patient: Teresa Holloway  Procedure(s) Performed: TOTAL HIP ARTHROPLASTY ANTERIOR APPROACH (Right: Hip)     Patient location during evaluation: PACU Anesthesia Type: Spinal and MAC Level of consciousness: awake and alert Pain management: pain level controlled Vital Signs Assessment: post-procedure vital signs reviewed and stable Respiratory status: nonlabored ventilation, respiratory function stable and spontaneous breathing Cardiovascular status: stable and blood pressure returned to baseline Postop Assessment: no apparent nausea or vomiting Anesthetic complications: no   No notable events documented.              Chantelle Verdi

## 2023-06-08 NOTE — Progress Notes (Signed)
.  Subjective: 2 Days Post-Op Procedure(s) (LRB): TOTAL HIP ARTHROPLASTY ANTERIOR APPROACH (Right)  Patient is alert but only oriented to self. She believes she is in [place]. Per her daughter in law, her mental status is worse than prior to surgery  No Nausea, no Vomiting, yes passing gas. Taking PO well. Denies SOB, Chest or Calf Pain. Using Incentive Spirometer, PAS in place. Ambulate WBAT with pt. Walked 75 ft with therapy yesterday. Patient reports pain as mild.  Objective: Vital signs in last 24 hours: Temp:  [97.6 F (36.4 C)-98.3 F (36.8 C)] 97.6 F (36.4 C) (02/16 0600) Pulse Rate:  [87-97] 95 (02/16 0600) Resp:  [16-18] 16 (02/16 0600) BP: (100-121)/(58-88) 102/88 (02/16 0600) SpO2:  [94 %-99 %] 99 % (02/16 0600)  Labs: No results for input(s): "HGB" in the last 72 hours. No results for input(s): "WBC", "RBC", "HCT", "PLT" in the last 72 hours. No results for input(s): "NA", "K", "CL", "CO2", "BUN", "CREATININE", "GLUCOSE", "CALCIUM" in the last 72 hours. No results for input(s): "LABPT", "INR" in the last 72 hours.  Physical Exam:   Neurologically slightly confused, oriented to self Neurovascular intact Sensation intact distally Intact pulses distally Dorsiflexion/Plantar flexion intact Incision: dressing C/D/I Compartment soft  Assessment/Plan:  2 Days Post-Op Procedure(s) (LRB): TOTAL HIP ARTHROPLASTY ANTERIOR APPROACH (Right) ADDITIONAL DIAGNOSIS: Anemia, CAD with history of MI, HTN, HLD, GERD Anticipated LOS equal to or greater than 2 midnights due to - Age 58 and older with one or more of the following:             - Expected need for hospital services (PT, OT, Nursing) required for safe   discharge             - Anticipated need for postoperative skilled nursing care or inpatient rehab   PLAN PT/OT WBAT, AROM and PROM  DVT Prophylaxis:  SCDx72hrs, ASA 81 mg BID DISCHARGE PLAN: Home vs SNF   Luci Bank 06/08/2023, 11:18 AM

## 2023-06-08 NOTE — Plan of Care (Signed)
  Problem: Clinical Measurements: Goal: Ability to maintain clinical measurements within normal limits will improve Outcome: Progressing Goal: Will remain free from infection Outcome: Progressing Goal: Diagnostic test results will improve Outcome: Progressing Goal: Respiratory complications will improve Outcome: Progressing Goal: Cardiovascular complication will be avoided Outcome: Progressing   Problem: Activity: Goal: Risk for activity intolerance will decrease Outcome: Progressing   Problem: Elimination: Goal: Will not experience complications related to bowel motility Outcome: Progressing Goal: Will not experience complications related to urinary retention Outcome: Progressing   Problem: Pain Managment: Goal: General experience of comfort will improve and/or be controlled Outcome: Progressing   Problem: Skin Integrity: Goal: Risk for impaired skin integrity will decrease Outcome: Progressing   Problem: Activity: Goal: Ability to avoid complications of mobility impairment will improve Outcome: Progressing Goal: Ability to tolerate increased activity will improve Outcome: Progressing   Problem: Clinical Measurements: Goal: Postoperative complications will be avoided or minimized Outcome: Progressing   Problem: Pain Management: Goal: Pain level will decrease with appropriate interventions Outcome: Progressing   Problem: Skin Integrity: Goal: Will show signs of wound healing Outcome: Progressing   Problem: Education: Goal: Knowledge of General Education information will improve Description: Including pain rating scale, medication(s)/side effects and non-pharmacologic comfort measures Outcome: Not Progressing   Problem: Health Behavior/Discharge Planning: Goal: Ability to manage health-related needs will improve Outcome: Not Progressing   Problem: Nutrition: Goal: Adequate nutrition will be maintained Outcome: Not Progressing   Problem: Coping: Goal: Level  of anxiety will decrease Outcome: Not Progressing   Problem: Safety: Goal: Ability to remain free from injury will improve Outcome: Not Progressing   Problem: Education: Goal: Knowledge of the prescribed therapeutic regimen will improve Outcome: Not Progressing Goal: Understanding of discharge needs will improve Outcome: Not Progressing

## 2023-06-08 NOTE — Progress Notes (Signed)
Part of order sent

## 2023-06-09 LAB — BASIC METABOLIC PANEL
Anion gap: 10 (ref 5–15)
BUN: 14 mg/dL (ref 8–23)
CO2: 29 mmol/L (ref 22–32)
Calcium: 9.1 mg/dL (ref 8.9–10.3)
Chloride: 100 mmol/L (ref 98–111)
Creatinine, Ser: 0.7 mg/dL (ref 0.44–1.00)
GFR, Estimated: >60 mL/min (ref >60)
Glucose, Bld: 92 mg/dL (ref 70–99)
Potassium: 3.5 mmol/L (ref 3.5–5.1)
Sodium: 139 mmol/L (ref 135–145)

## 2023-06-09 LAB — CBC
HCT: 27.1 % — ABNORMAL LOW (ref 36.0–46.0)
Hemoglobin: 8.3 g/dL — ABNORMAL LOW (ref 12.0–15.0)
MCH: 28.7 pg (ref 26.0–34.0)
MCHC: 30.6 g/dL (ref 30.0–36.0)
MCV: 93.8 fL (ref 80.0–100.0)
Platelets: 235 10*3/uL (ref 150–400)
RBC: 2.89 MIL/uL — ABNORMAL LOW (ref 3.87–5.11)
RDW: 16.7 % — ABNORMAL HIGH (ref 11.5–15.5)
WBC: 8.2 10*3/uL (ref 4.0–10.5)
nRBC: 0 % (ref 0.0–0.2)

## 2023-06-09 NOTE — Progress Notes (Signed)
Physical Therapy Treatment Patient Details Name: Teresa Holloway MRN: [id number] DOB: 27-Mar-1941 Today's Date: 06/09/2023   History of Present Illness 83 yo female s/p R anterior THA revision. PMH: TKA, MI, HTN, chronic back pain    PT Comments  POD # 3 pm session Assisted OOB to amb in hallway, practiced stairs, assisted to bathroom Then returned to room to perform some TE's following HEP handout.  Instructed on proper tech, freq as well as use of ICE.   Pt has met her mobility goals to D/C to home with 24/7 initial family support due to Pt's impaired memory/cognition.  Secure chat sent to RN.      If plan is discharge home, recommend the following: A little help with walking and/or transfers;A little help with bathing/dressing/bathroom;Assistance with cooking/housework;Help with stairs or ramp for entrance;Assist for transportation   Can travel by private vehicle        Equipment Recommendations  None recommended by PT    Recommendations for Other Services       Precautions / Restrictions Precautions Precaution/Restrictions Comments: AxO x 2 poor ST memory, pleasant but poor medical insight.  Pt plans to D/C to home with initial 24/7 family support between her Daughter and her Son in [place]. Restrictions Weight Bearing Restrictions Per Provider Order: No RLE Weight Bearing Per Provider Order: Weight bearing as tolerated     Mobility  Bed Mobility Overal bed mobility: Needs Assistance Bed Mobility: Supine to Sit     Supine to sit: Supervision, Contact guard     General bed mobility comments: increased time    Transfers Overall transfer level: Needs assistance Equipment used: Rolling walker (2 wheels) Transfers: Sit to/from Stand Sit to Stand: Supervision           General transfer comment: VC's on safety and increased time to rise.  Also self able toilet transfer and peri care but VC's on safety with use of walker.    Ambulation/Gait Ambulation/Gait  assistance: Supervision, Contact guard assist Gait Distance (Feet): 75 Feet Assistive device: Rolling walker (2 wheels) Gait Pattern/deviations: Step-through pattern, Decreased stance time - right Gait velocity: decreased     General Gait Details: cues for sequence, trunk extension and RW proximity. intermittent drifiting to L, pt able to correct with cues   Stairs Stairs: Yes Stairs assistance: Min assist, Contact guard assist Stair Management: Two rails, Step to pattern, Forwards Number of Stairs: 2 General stair comments: 50% VC's on proper sequencing with repeat instructions due to poor ST memeory.   Wheelchair Mobility     Tilt Bed    Modified Rankin (Stroke Patients Only)       Balance                                            Communication    Cognition Arousal: Alert Behavior During Therapy: WFL for tasks assessed/performed   PT - Cognitive impairments: Safety/Judgement, Orientation, Memory, Problem solving, Awareness                       PT - Cognition Comments: follows one step commands consistently; decr STM, decr awareness deficits;  baseline confusion per family        Cueing    Exercises  Total Hip Replacement TE's following HEP Handout 10 reps ankle pumps 05 reps knee presses 05 reps heel slides 05 reps  SAQ's 05 reps ABD Instructed how to use a belt loop to assist  Followed by ICE     General Comments        Pertinent Vitals/Pain Pain Assessment Pain Assessment: Faces Faces Pain Scale: Hurts a little bit Pain Location: right hip Pain Descriptors / Indicators: Discomfort, Operative site guarding, Grimacing Pain Intervention(s): Monitored during session, Premedicated before session, Repositioned, Ice applied    Home Living                          Prior Function            PT Goals (current goals can now be found in the care plan section) Progress towards PT goals: Progressing toward  goals    Frequency    7X/week      PT Plan      Co-evaluation              AM-PAC PT "6 Clicks" Mobility   Outcome Measure  Help needed turning from your back to your side while in a flat bed without using bedrails?: A Little Help needed moving from lying on your back to sitting on the side of a flat bed without using bedrails?: A Little Help needed moving to and from a bed to a chair (including a wheelchair)?: A Little Help needed standing up from a chair using your arms (e.g., wheelchair or bedside chair)?: A Little Help needed to walk in hospital room?: A Little Help needed climbing 3-5 steps with a railing? : A Little 6 Click Score: 18    End of Session Equipment Utilized During Treatment: Gait belt Activity Tolerance: Patient tolerated treatment well Patient left: in chair;with call bell/phone within reach;with chair alarm set;with family/visitor present Nurse Communication: Mobility status PT Visit Diagnosis: Other abnormalities of gait and mobility (R26.89)     Time: 1412-1440 PT Time Calculation (min) (ACUTE ONLY): 28 min  Charges:    $Gait Training: 8-22 mins $Therapeutic Exercise: 8-22 mins PT General Charges $$ ACUTE PT VISIT: 1 Visit                     Felecia Shelling  PTA Acute  Rehabilitation Services Office M-F          [phone]

## 2023-06-09 NOTE — TOC Progression Note (Addendum)
Transition of Care [facility]) - Progression Note    Patient Details  Name: Teresa Holloway MRN: [id number] Date of Birth: 11-04-1940  Transition of Care [facility]) CM/SW Contact  Howell Rucks, RN Phone Number: 06/09/2023, 11:39 AM  Clinical Narrative:   Per progression rounds and PT notes, dtr in law requesting SNF secondary to decline in pt's cognition post surgery, pt has cognitive deficits at baseline, pt is set up for OPPT. PT notified TOC and Ortho of family request for SNF.   -1:20pm Teams chat from Ortho PA " She will need some with her for the first couple days. NCM met with pt and pt's dtr at bedside, updated on PT notes today and per Ortho PA, plans is for original OPPT with someone to supervise pt at home for first few days, dtr voiced understanding.       Barriers to Discharge: No Barriers Identified  Expected Discharge Plan and Services                         DME Arranged: N/A DME Agency: NA                   Social Determinants of Health (SDOH) Interventions SDOH Screenings   Food Insecurity: No Food Insecurity (06/08/2023)  Housing: Low Risk  (06/07/2023)  Transportation Needs: No Transportation Needs (06/07/2023)  Utilities: Not At Risk (06/07/2023)  Alcohol Screen: Low Risk  (08/21/2022)  Depression (PHQ2-9): Low Risk  (04/24/2023)  Recent Concern: Depression (PHQ2-9) - Medium Risk (02/11/2023)  Financial Resource Strain: Low Risk  (05/08/2023)  Physical Activity: Sufficiently Active (08/21/2022)  Social Connections: Moderately Isolated (06/07/2023)  Stress: No Stress Concern Present (08/21/2022)  Tobacco Use: Low Risk  (06/06/2023)    Readmission Risk Interventions    06/07/2023   12:52 PM  Readmission Risk Prevention Plan  Transportation Screening Complete  PCP or Specialist Appt within 3-5 Days Complete  HRI or Home Care Consult Complete  Social Work Consult for Recovery Care Planning/Counseling Complete  Palliative Care Screening Not Applicable   Medication Review [employer]) Complete

## 2023-06-09 NOTE — Progress Notes (Signed)
PATIENT ID: Teresa Holloway  MRN: [id number]  DOB/AGE:  1941-04-20 / 83 y.o.  3 Days Post-Op Procedure(s) (LRB): TOTAL HIP ARTHROPLASTY ANTERIOR APPROACH (Right)    PROGRESS NOTE Subjective: Patient is alert, oriented, no Nausea, no Vomiting, yes passing gas, . Taking PO well. Denies SOB, Chest or Calf Pain. Using Incentive Spirometer, PAS in place. Ambulate WBAT with pt walking 140 ft with therapy Patient reports pain as  mild .    Objective: Vital signs in last 24 hours: Vitals:   06/08/23 0600 06/08/23 1329 06/08/23 2055 06/09/23 0633  BP: 102/88 (!) 141/116 121/61 123/61  Pulse: 95 86 79 77  Resp: 16 16 19 16   Temp: 97.6 F (36.4 C) 98.2 F (36.8 C) 97.7 F (36.5 C) 98.1 F (36.7 C)  TempSrc:   Oral Oral  SpO2: 99% 97% 100% 95%  Weight:      Height:          Intake/Output from previous day: I/O last 3 completed shifts: In: 340 [P.O.:340] Out: 1 [Urine:1]   Intake/Output this shift: No intake/output data recorded.   LABORATORY DATA: Recent Labs    06/09/23 0749  WBC 8.2  HGB 8.3*  HCT 27.1*  PLT 235  NA 139  K 3.5  CL 100  CO2 29  BUN 14  CREATININE 0.70  GLUCOSE 92  CALCIUM 9.1    Examination: Neurologically intact Neurovascular intact Sensation intact distally Intact pulses distally Dorsiflexion/Plantar flexion intact Incision: dressing C/D/I and no drainage No cellulitis present Compartment soft} XR AP&Lat of hip shows well placed\fixed THA  Assessment:   3 Days Post-Op Procedure(s) (LRB): TOTAL HIP ARTHROPLASTY ANTERIOR APPROACH (Right) ADDITIONAL DIAGNOSIS:  Expected Acute Blood Loss Anemia, TKA, MI, HTN, chronic back pain  Anticipated LOS equal to or greater than 2 midnights due to - Age 36 and older with one or more of the following:  - Obesity  - Expected need for hospital services (PT, OT, Nursing) required for safe  discharge  - Anticipated need for postoperative skilled nursing care or inpatient rehab   OR    - Patient is a  high risk of re-admission due to: Barriers to post-acute care (logistical, no family support in home)   Plan: PT/OT WBAT, THA  DVT Prophylaxis: SCDx72 hrs, ASA 81 mg BID x 2 weeks  DISCHARGE PLAN: Skilled Nursing Facility/Rehab  DISCHARGE NEEDS: HHPT, Walker, and 3-in-1 comode seat

## 2023-06-09 NOTE — Progress Notes (Signed)
Physical Therapy Treatment Patient Details Name: Teresa Holloway MRN: [id number] DOB: August 03, 1940 Today's Date: 06/09/2023   History of Present Illness 83 yo female s/p R anterior THA revision. PMH: TKA, MI, HTN, chronic back pain    PT Comments  Pt continues to make steady progress, R hip pain improving with improved wt shift to RLE; amb 150' with RW and supervision. Dtr in law  present in room. RN states family requesting SNF, will pss along to MD and TOC, pt is set up for OPPT.  Pt does likely need supervision for safety d/t cognitive status, it is my understanding pt has baseline cognitive deficits. Will defer plan to ortho    If plan is discharge home, recommend the following: A little help with walking and/or transfers;A little help with bathing/dressing/bathroom;Assistance with cooking/housework;Help with stairs or ramp for entrance;Assist for transportation   Can travel by private vehicle        Equipment Recommendations  None recommended by PT    Recommendations for Other Services       Precautions / Restrictions Precautions Precautions: Anterior Hip Recall of Precautions/Restrictions: Impaired Precaution/Restrictions Comments: reviewed anterior precautions with pt and family member; family verbalizes understanding; pt recalls no hip abduction Restrictions RLE Weight Bearing Per Provider Order: Weight bearing as tolerated     Mobility  Bed Mobility Overal bed mobility: Needs Assistance Bed Mobility: Sit to Supine       Sit to supine: Contact guard assist, Min assist   General bed mobility comments: very light assist with RLE on to bed    Transfers Overall transfer level: Needs assistance Equipment used: Rolling walker (2 wheels) Transfers: Sit to/from Stand Sit to Stand: Contact guard assist, Supervision           General transfer comment: STS from recliner, BSC(over toilet) with supervision to CGA for safety    Ambulation/Gait Ambulation/Gait  assistance: Supervision, Contact guard assist Gait Distance (Feet): 150 Feet (+10' more) Assistive device: Rolling walker (2 wheels) Gait Pattern/deviations: Step-through pattern, Decreased stance time - right       General Gait Details: cues for sequence, trunk extension and RW proximity. intermittent drifiting to L, pt able to correct with cues   Stairs             Wheelchair Mobility     Tilt Bed    Modified Rankin (Stroke Patients Only)       Balance           Standing balance support: No upper extremity supported, During functional activity, Reliant on assistive device for balance Standing balance-Leahy Scale: Fair Standing balance comment: at least fair; pt is able to perform her own hygiene after toileting, stand at sink and wash hands without UE support                            Communication    Cognition Arousal: Alert Behavior During Therapy: WFL for tasks assessed/performed   PT - Cognitive impairments: Safety/Judgement, Orientation, Memory   Orientation impairments: Place, Time, Situation                   PT - Cognition Comments: follows one step commands consistently; decr STM, decr awareness deficits;  baseline confusion per family Following commands: Intact      Cueing Cueing Techniques: Verbal cues  Exercises      General Comments General comments (skin integrity, edema, etc.): set up for bath at pt request  Pertinent Vitals/Pain Pain Assessment Pain Assessment: Faces Faces Pain Scale: Hurts a little bit Pain Location: right hip Pain Descriptors / Indicators: Discomfort Pain Intervention(s): Limited activity within patient's tolerance, Monitored during session, Repositioned    Home Living                          Prior Function            PT Goals (current goals can now be found in the care plan section) Acute Rehab PT Goals PT Goal Formulation: With patient Time For Goal Achievement:  06/14/23 Potential to Achieve Goals: Good Progress towards PT goals: Progressing toward goals    Frequency    7X/week      PT Plan      Co-evaluation              AM-PAC PT "6 Clicks" Mobility   Outcome Measure  Help needed turning from your back to your side while in a flat bed without using bedrails?: A Little Help needed moving from lying on your back to sitting on the side of a flat bed without using bedrails?: A Little Help needed moving to and from a bed to a chair (including a wheelchair)?: A Little Help needed standing up from a chair using your arms (e.g., wheelchair or bedside chair)?: A Little Help needed to walk in hospital room?: A Little Help needed climbing 3-5 steps with a railing? : A Little 6 Click Score: 18    End of Session Equipment Utilized During Treatment: Gait belt Activity Tolerance: Patient tolerated treatment well Patient left: in bed;with call bell/phone within reach;with bed alarm set;with family/visitor present   PT Visit Diagnosis: Other abnormalities of gait and mobility (R26.89)     Time: 1610-9604 PT Time Calculation (min) (ACUTE ONLY): 19 min  Charges:    $Gait Training: 8-22 mins PT General Charges $$ ACUTE PT VISIT: 1 Visit                     Kamon Fahr, PT  Acute Rehab Dept [facility]) [phone]  06/09/2023    [facility] 06/09/2023, 10:59 AM

## 2023-06-10 NOTE — Care Management Important Message (Signed)
Important Message  Patient Details IM Letter given. Name: Teresa Holloway MRN: [id number] Date of Birth: 1940-09-30   Important Message Given:  Yes - Medicare IM     Caren Macadam 06/10/2023, 10:05 AM

## 2023-06-10 NOTE — Progress Notes (Signed)
PATIENT ID: Teresa Holloway  MRN: [id number]  DOB/AGE:  [birth year] / 83 y.o.  4 Days Post-Op Procedure(s) (LRB): TOTAL HIP ARTHROPLASTY ANTERIOR APPROACH (Right)    PROGRESS NOTE Subjective: Patient is alert, oriented, no Nausea, no Vomiting, yes passing gas, . Taking PO well. Denies SOB, Chest or Calf Pain. Using Incentive Spirometer, PAS in place. Ambulate WBAT with Pt walking 75 ft  Patient reports pain as mild .    Objective: Vital signs in last 24 hours: Vitals:   06/09/23 0633 06/09/23 1434 06/09/23 2233 06/10/23 0623  BP: 123/61 (!) 95/54 108/66 132/84  Pulse: 77 73 73 80  Resp: 16 17 15 15   Temp: 98.1 F (36.7 C) 97.7 F (36.5 C) 98 F (36.7 C) 97.9 F (36.6 C)  TempSrc: Oral Oral    SpO2: 95% 99% 98% 97%  Weight:      Height:          Intake/Output from previous day: I/O last 3 completed shifts: In: 640 [P.O.:640] Out: -    Intake/Output this shift: No intake/output data recorded.   LABORATORY DATA: Recent Labs    06/09/23 0749  WBC 8.2  HGB 8.3*  HCT 27.1*  PLT 235  NA 139  K 3.5  CL 100  CO2 29  BUN 14  CREATININE 0.70  GLUCOSE 92  CALCIUM 9.1    Examination: Neurologically intact Neurovascular intact Sensation intact distally Intact pulses distally Dorsiflexion/Plantar flexion intact Incision: dressing C/D/I and no drainage No cellulitis present Compartment soft} XR AP&Lat of hip shows well placed\fixed THA  Assessment:   4 Days Post-Op Procedure(s) (LRB): TOTAL HIP ARTHROPLASTY ANTERIOR APPROACH (Right) ADDITIONAL DIAGNOSIS:  Expected Acute Blood Loss Anemia, TKA, MI, HTN, chronic back pain  Anticipated LOS equal to or greater than 2 midnights due to - Age 82 and older with one or more of the following:  - Obesity  - Expected need for hospital services (PT, OT, Nursing) required for safe  discharge  - Anticipated need for postoperative skilled nursing care or inpatient rehab  Plan: PT/OT WBAT, THA  DVT Prophylaxis: SCDx72  hrs, ASA 81 mg BID x 2 weeks  DISCHARGE PLAN: Home, today  DISCHARGE NEEDS: HHPT, Walker, and 3-in-1 comode seat

## 2023-06-10 NOTE — Progress Notes (Signed)
Discharge instructions were reviewed wit the patient and her daughter. Primary learner, daughter. She denied questions, or concerns at this time. Pt is stable, no change from am assessment. Dressing right hip, clean, dry intact. Surrounding skin intact, no erythema or ecchymosis.

## 2023-06-10 NOTE — Discharge Summary (Signed)
Patient ID: Teresa Holloway MRN: [id number] DOB/AGE: [birth year] 82 y.o.  Admit date: 06/06/2023 Discharge date: 06/10/2023  Admission Diagnoses:  Principal Problem:   Failed total hip arthroplasty [facility]) Active Problems:   Recurrent dislocation of right hip   Discharge Diagnoses:  Same  Past Medical History:  Diagnosis Date   Anxiety    Arthritis    Knees, hips   CAD (coronary artery disease)    Chronic back pain    Depression    GERD (gastroesophageal reflux disease)    Hyperlipidemia    Hypertension    MI (myocardial infarction) (HCC)    Osteopenia     Surgeries: Procedure(s): TOTAL HIP ARTHROPLASTY ANTERIOR APPROACH on 06/06/2023   Consultants:   Discharged Condition: Improved  Hospital Course: Teresa Holloway is an 83 y.o. female who was admitted 06/06/2023 for operative treatment ofFailed total hip arthroplasty (HCC). Patient has severe unremitting pain that affects sleep, daily activities, and work/hobbies. After pre-op clearance the patient was taken to the operating room on 06/06/2023 and underwent  Procedure(s): TOTAL HIP ARTHROPLASTY ANTERIOR APPROACH.    Patient was given perioperative antibiotics:  Anti-infectives (From admission, onward)    Start     Dose/Rate Route Frequency Ordered Stop   06/06/23 0945  ceFAZolin (ANCEF) IVPB 2g/100 mL premix        2 g 200 mL/hr over 30 Minutes Intravenous On call to O.R. 06/06/23 0931 06/06/23 1416        Patient was given sequential compression devices, early ambulation, and chemoprophylaxis to prevent DVT.  Patient benefited maximally from hospital stay and there were no complications.    Recent vital signs: Patient Vitals for the past 24 hrs:  BP Temp Temp src Pulse Resp SpO2  06/10/23 0623 132/84 97.9 F (36.6 C) -- 80 15 97 %  06/09/23 2233 108/66 98 F (36.7 C) -- 73 15 98 %  06/09/23 1434 (!) 95/54 97.7 F (36.5 C) Oral 73 17 99 %     Recent laboratory studies:  Recent Labs    06/09/23 0749   WBC 8.2  HGB 8.3*  HCT 27.1*  PLT 235  NA 139  K 3.5  CL 100  CO2 29  BUN 14  CREATININE 0.70  GLUCOSE 92  CALCIUM 9.1     Discharge Medications:   Allergies as of 06/10/2023       Reactions   Atarax [hydroxyzine] Nausea And Vomiting   Bactrim [sulfamethoxazole-trimethoprim] Nausea Only   Coreg [carvedilol] Other (See Comments)   Unknown        Medication List     STOP taking these medications    traMADol 50 MG tablet Commonly known as: ULTRAM       TAKE these medications    amLODipine 5 MG tablet Commonly known as: NORVASC Take 1 tablet (5 mg total) by mouth daily.   aspirin EC 81 MG tablet Take 1 tablet (81 mg total) by mouth 2 (two) times daily. What changed: when to take this   atorvastatin 40 MG tablet Commonly known as: LIPITOR TAKE 1 TABLET BY MOUTH DAILY AT 6 PM.   benazepril 40 MG tablet Commonly known as: LOTENSIN Take 1 tablet (40 mg total) by mouth daily. What changed: when to take this   bisacodyl 5 MG EC tablet Commonly known as: DULCOLAX Take 5 mg by mouth at bedtime.   cholecalciferol 1000 units tablet Commonly known as: VITAMIN D Take 1,000 Units by mouth daily.   cyanocobalamin 1000 MCG tablet Commonly  known as: VITAMIN B12 Take 1,000 mcg by mouth daily.   diclofenac sodium 1 % Gel Commonly known as: VOLTAREN Apply 2 g topically 4 (four) times daily.   escitalopram 20 MG tablet Commonly known as: LEXAPRO Take 1 tablet (20 mg total) by mouth daily.   famotidine 20 MG tablet Commonly known as: PEPCID Take 1 tablet (20 mg total) by mouth 2 (two) times daily.   ferrous sulfate 325 (65 FE) MG tablet Take 325 mg by mouth daily with breakfast.   hydrochlorothiazide 25 MG tablet Commonly known as: HYDRODIURIL Take 1 tablet (25 mg total) by mouth daily.   HYDROmorphone 2 MG tablet Commonly known as: Dilaudid Take 1 tablet (2 mg total) by mouth every 4 (four) hours as needed for severe pain (pain score 7-10). What  changed: when to take this   isosorbide mononitrate 60 MG 24 hr tablet Commonly known as: IMDUR TAKE 1 TABLET BY MOUTH EVERYDAY AT BEDTIME   metoprolol succinate 25 MG 24 hr tablet Commonly known as: TOPROL-XL Take 1 tablet (25 mg total) by mouth daily.   nitroGLYCERIN 0.4 MG SL tablet Commonly known as: Nitrostat Place 1 tablet (0.4 mg total) under the tongue every 5 (five) minutes as needed for chest pain.   polyethylene glycol powder 17 GM/SCOOP powder Commonly known as: GLYCOLAX/MIRALAX Take 17 g by mouth daily.   potassium chloride 10 MEQ tablet Commonly known as: Klor-Con M10 Take 1 tablet (10 mEq total) by mouth 2 (two) times daily.   Probiotic Chew Chew 1 capsule by mouth daily.   tiZANidine 2 MG tablet Commonly known as: ZANAFLEX Take 1 tablet (2 mg total) by mouth every 6 (six) hours as needed for muscle spasms.               Durable Medical Equipment  (From admission, onward)           Start     Ordered   06/06/23 2001  DME Walker rolling  Once       Question:  Patient needs a walker to treat with the following condition  Answer:  Status post right hip replacement   06/06/23 2000   06/06/23 2001  DME 3 n 1  Once        06/06/23 2000              Discharge Care Instructions  (From admission, onward)           Start     Ordered   06/10/23 0000  Weight bearing as tolerated        06/10/23 0809            Diagnostic Studies: DG HIP UNILAT WITH PELVIS 1V RIGHT Result Date: 06/06/2023 CLINICAL DATA:  Elective surgery. EXAM: DG HIP (WITH OR WITHOUT PELVIS) 1V RIGHT COMPARISON:  Radiograph 05/27/2023 FINDINGS: Eight fluoroscopic spot views of the pelvis and right hip obtained in the operating room. Sequential images during revision hip arthroplasty. Fluoroscopy time 19 seconds. Dose 1.73 mGy. IMPRESSION: Procedural fluoroscopy during right hip arthroplasty revision. Electronically Signed   By: Narda Rutherford M.D.   On: 06/06/2023 16:11    DG C-Arm 1-60 Min-No Report Result Date: 06/06/2023 Fluoroscopy was utilized by the requesting physician.  No radiographic interpretation.   DG C-Arm 1-60 Min-No Report Result Date: 06/06/2023 Fluoroscopy was utilized by the requesting physician.  No radiographic interpretation.   DG Hip [place] or [place] Pelvis 1 View Right Result Date: 05/27/2023 CLINICAL DATA:  Hip  reduction EXAM: DG HIP (WITH OR WITHOUT PELVIS) 1V PORT RIGHT COMPARISON:  Previous  of the same day FINDINGS: Previous dislocation of femoral prosthesis components Has been reduced. No fracture or other apparentcomplication. IMPRESSION: Successful reduction of right hip prosthesis Electronically Signed   By: Corlis Leak M.D.   On: 05/27/2023 17:46   DG FEMUR, MIN 2 VIEWS RIGHT Result Date: 05/27/2023 CLINICAL DATA:  Trauma and right hip pain. EXAM: PELVIS - 1-2 VIEW; RIGHT FEMUR 2 VIEWS; RIGHT KNEE - COMPLETE 4+ VIEW COMPARISON:  Right hip radiograph dated 05/02/2023. FINDINGS: There is a total right hip arthroplasty. There is superior dislocation of the femoral component of the arthroplasty in relation to the acetabular cup. There is no acute fracture. Severe arthritic changes of the right knee. The soft tissues are unremarkable. IMPRESSION: Superior dislocation of the femoral component of the right hip arthroplasty. Electronically Signed   By: Elgie Collard M.D.   On: 05/27/2023 16:27   DG Knee Complete 4 Views Right Result Date: 05/27/2023 CLINICAL DATA:  Trauma and right hip pain. EXAM: PELVIS - 1-2 VIEW; RIGHT FEMUR 2 VIEWS; RIGHT KNEE - COMPLETE 4+ VIEW COMPARISON:  Right hip radiograph dated 05/02/2023. FINDINGS: There is a total right hip arthroplasty. There is superior dislocation of the femoral component of the arthroplasty in relation to the acetabular cup. There is no acute fracture. Severe arthritic changes of the right knee. The soft tissues are unremarkable. IMPRESSION: Superior dislocation of the femoral component of  the right hip arthroplasty. Electronically Signed   By: Elgie Collard M.D.   On: 05/27/2023 16:27   DG Pelvis 1-2 Views Result Date: 05/27/2023 CLINICAL DATA:  Trauma and right hip pain. EXAM: PELVIS - 1-2 VIEW; RIGHT FEMUR 2 VIEWS; RIGHT KNEE - COMPLETE 4+ VIEW COMPARISON:  Right hip radiograph dated 05/02/2023. FINDINGS: There is a total right hip arthroplasty. There is superior dislocation of the femoral component of the arthroplasty in relation to the acetabular cup. There is no acute fracture. Severe arthritic changes of the right knee. The soft tissues are unremarkable. IMPRESSION: Superior dislocation of the femoral component of the right hip arthroplasty. Electronically Signed   By: Elgie Collard M.D.   On: 05/27/2023 16:27    Disposition: Discharge disposition: 01-Home or Self Care       Discharge Instructions     Call MD / Call 911   Complete by: As directed    If you experience chest pain or shortness of breath, CALL 911 and be transported to the hospital emergency room.  If you develope a fever above 101 F, pus (white drainage) or increased drainage or redness at the wound, or calf pain, call your surgeon's office.   Constipation Prevention   Complete by: As directed    Drink plenty of fluids.  Prune juice may be helpful.  You may use a stool softener, such as Colace (over the counter) 100 mg twice a day.  Use MiraLax (over the counter) for constipation as needed.   Diet - low sodium heart healthy   Complete by: As directed    Driving restrictions   Complete by: As directed    No driving for 2 weeks   Increase activity slowly as tolerated   Complete by: As directed    Patient may shower   Complete by: As directed    You may shower without a dressing once there is no drainage.  Do not wash over the wound.  If drainage remains, cover wound with  plastic wrap and then shower.   Post-operative opioid taper instructions:   Complete by: As directed    POST-OPERATIVE OPIOID  TAPER INSTRUCTIONS: It is important to wean off of your opioid medication as soon as possible. If you do not need pain medication after your surgery it is ok to stop day one. Opioids include: Codeine, Hydrocodone(Norco, Vicodin), Oxycodone(Percocet, oxycontin) and hydromorphone amongst others.  Long term and even short term use of opiods can cause: Increased pain response Dependence Constipation Depression Respiratory depression And more.  Withdrawal symptoms can include Flu like symptoms Nausea, vomiting And more Techniques to manage these symptoms Hydrate well Eat regular healthy meals Stay active Use relaxation techniques(deep breathing, meditating, yoga) Do Not substitute Alcohol to help with tapering If you have been on opioids for less than two weeks and do not have pain than it is ok to stop all together.  Plan to wean off of opioids This plan should start within one week post op of your joint replacement. Maintain the same interval or time between taking each dose and first decrease the dose.  Cut the total daily intake of opioids by one tablet each day Next start to increase the time between doses. The last dose that should be eliminated is the evening dose.      Weight bearing as tolerated   Complete by: As directed         Follow-up Information     Gean Birchwood, MD Follow up in 2 week(s).   Specialty: Orthopedic Surgery Contact information: [address] [place] [address] [phone]                  Signed: Dannielle Burn 06/10/2023, 8:10 AM

## 2023-06-11 ENCOUNTER — Telehealth: Payer: Self-pay | Admitting: *Deleted

## 2023-06-11 ENCOUNTER — Other Ambulatory Visit: Payer: Self-pay | Admitting: Nurse Practitioner

## 2023-06-11 ENCOUNTER — Encounter (HOSPITAL_COMMUNITY): Payer: Self-pay | Admitting: Orthopedic Surgery

## 2023-06-11 DIAGNOSIS — F3342 Major depressive disorder, recurrent, in full remission: Secondary | ICD-10-CM

## 2023-06-11 LAB — AEROBIC/ANAEROBIC CULTURE W GRAM STAIN (SURGICAL/DEEP WOUND)
Culture: NO GROWTH
Gram Stain: NONE SEEN

## 2023-06-11 NOTE — Transitions of Care (Post Inpatient/ED Visit) (Signed)
06/11/2023  Name: Teresa Holloway MRN: [id number] DOB: January 30, 1941  Today's TOC FU Call Status: Today's TOC FU Call Status:: Successful TOC FU Call Completed TOC FU Call Complete Date: 06/11/23 Patient's Name and Date of Birth confirmed.  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 How have you been since you were released from the hospital?:  (eating and drinking well, ambulating with walker, pain managed at present) Any questions or concerns?: No  Items Reviewed: Did you receive and understand the discharge instructions provided?: Yes Medications obtained,verified, and reconciled?: Yes (Medications Reviewed) Any new allergies since your discharge?: No Dietary orders reviewed?: Yes Type of Diet Ordered:: low sodium,  heart healthy Do you have support at home?: Yes People in Home: alone Name of Support/Comfort Primary Source: daughter in law Teresa Holloway Reviewed signs/ symptoms infection, reportable signs/symptoms Reviewed discharge summary- activity restrictions Reviewed hip precautions  Medications Reviewed Today: Medications Reviewed Today     Reviewed by Teresa Gallus, RN (Registered Nurse) on 06/11/23 at 1413  Med List Status: <None>   Medication Order Taking? Sig Documenting Provider Last Dose Status Informant  amLODipine (NORVASC) 5 MG tablet [id number] Yes Take 1 tablet (5 mg total) by mouth daily. Teresa Pierini, FNP Taking Active Family Member  aspirin EC 81 MG tablet [id number] Yes Take 1 tablet (81 mg total) by mouth 2 (two) times daily. Teresa Katz, PA-C Taking Active   atorvastatin (LIPITOR) 40 MG tablet [id number] Yes TAKE 1 TABLET BY MOUTH DAILY AT 6 PM. Teresa Pierini, FNP Taking Active Family Member  benazepril (LOTENSIN) 40 MG tablet [id number] Yes Take 1 tablet (40  mg total) by mouth daily.  Patient taking differently: Take 40 mg by mouth at bedtime.   Teresa Deutscher Mary-Margaret, FNP Taking Active Family Member  bisacodyl (DULCOLAX) 5 MG EC tablet [id number] Yes Take 5 mg by mouth at bedtime. [provider] Taking Active Family Member  cholecalciferol (VITAMIN D) 1000 UNITS tablet [id number] Yes Take 1,000 Units by mouth daily. [provider] Taking Active Family Member  cyanocobalamin (VITAMIN B12) 1000 MCG tablet [id number] Yes Take 1,000 mcg by mouth daily. [provider] Taking Active Family Member  escitalopram (LEXAPRO) 20 MG tablet [id number] Yes Take 1 tablet (20 mg total) by mouth daily. **NEEDS TO BE SEEN BEFORE NEXT REFILLDaphine Deutscher, Mary-Margaret, FNP Taking Active   famotidine (PEPCID) 20 MG tablet [id number]  Take 1 tablet (20 mg total) by mouth 2 (two) times daily. Teresa Baton, MD  Active Family Member  ferrous sulfate 325 (65 FE) MG tablet [id number] Yes Take 325 mg by mouth daily with breakfast. [provider] Taking Active Family Member  hydrochlorothiazide (HYDRODIURIL) 25 MG tablet [id number] Yes Take 1 tablet (25 mg total) by mouth daily. Teresa Pierini, FNP Taking Active Family Member  HYDROmorphone (DILAUDID) 2 MG tablet [id number] No Take 1 tablet (2 mg total) by mouth every 4 (four) hours as needed for severe pain (pain score 7-10).  Patient not taking: Reported on 06/11/2023   Teresa Katz, PA-C Not Taking Active            Med Note Teresa Holloway, Teresa Holloway   Wed Jun 11, 2023  2:11 PM) Pt stopped taking and will be taking Tramadol per daughter in law  isosorbide mononitrate (IMDUR) 60 MG 24 hr tablet [id number] Yes TAKE 1 TABLET BY MOUTH EVERYDAY AT BEDTIME Teresa Deutscher, Mary-Margaret,  FNP Taking Active Family Member  metoprolol succinate (TOPROL-XL) 25 MG 24 hr tablet [id number] No Take 1 tablet (25 mg total) by mouth daily.  Patient not taking: Reported on 05/29/2023   Teresa Pierini, FNP Not Taking  Active Family Member  nitroGLYCERIN (NITROSTAT) 0.4 MG SL tablet [id number] Yes Place 1 tablet (0.4 mg total) under the tongue every 5 (five) minutes as needed for chest pain. Teresa Pierini, FNP Taking Active Family Member           Med Note Teresa Holloway   Mon Feb 03, 2023  2:42 PM)    polyethylene glycol powder [facility]) 17 GM/SCOOP powder [id number] Yes Take 17 g by mouth daily. Teresa Baton, MD Taking Active Family Member  potassium chloride (KLOR-CON M10) 10 MEQ tablet [id number] Yes Take 1 tablet (10 mEq total) by mouth 2 (two) times daily. Teresa Pierini, FNP Taking Active Family Member  Probiotic CHEW [id number] Yes Chew 1 capsule by mouth daily. [provider] Taking Active Family Member  tiZANidine (ZANAFLEX) 2 MG tablet [id number] No Take 1 tablet (2 mg total) by mouth every 6 (six) hours as needed for muscle spasms.  Patient not taking: Reported on 06/11/2023   Teresa Katz, PA-C Not Taking Active             Home Care and Equipment/Supplies: Were Home Health Services Ordered?: No Any new equipment or medical supplies ordered?: Yes Name of Medical supply agency?: pt already has rolling walker and 3 in 1 Were you able to get the equipment/medical supplies?: Yes  Functional Questionnaire: Do you need assistance with bathing/showering or dressing?: Yes (family assists) Do you need assistance with meal preparation?: Yes (family assists) Do you need assistance with eating?: No Do you have difficulty maintaining continence: No Do you need assistance with getting out of bed/getting out of Holloway chair/moving?: Yes (using walker) Do you have difficulty managing or taking your medications?: Yes  Follow up appointments reviewed: PCP Follow-up appointment confirmed?: No (daughter in law Teresa Holloway states she will call today and make follow up appt.) MD Provider Line Number:[phone] Given: No Specialist Hospital Follow-up appointment confirmed?:  Yes Date of Specialist follow-up appointment?: 06/17/23 Follow-Up Specialty Provider:: Dr. Gean Birchwood Do you need transportation to your follow-up appointment?: No (family provides transportation) Do you understand care options if your condition(s) worsen?: Yes-patient verbalized understanding  SDOH Interventions Today    Flowsheet Row Most Recent Value  SDOH Interventions   Food Insecurity Interventions Intervention Not Indicated  Housing Interventions Intervention Not Indicated  Transportation Interventions Intervention Not Indicated  Utilities Interventions Intervention Not Indicated       Irving Shows [facility], BSN RN Care Manager/ Transition of Care [place] Population Health [phone]

## 2023-06-13 ENCOUNTER — Ambulatory Visit: Payer: Self-pay | Admitting: Nurse Practitioner

## 2023-06-13 NOTE — Telephone Encounter (Signed)
Copied from CRM [id number]. Topic: Clinical - Medical Advice >> Jun 13, 2023  8:52 AM Teresa Holloway wrote: Reason for CRM: patient had surgery and PATIENT IS NOT ABLE TO USE THE BATHROOM SHE WANTS A RX Reason for Disposition  Last bowel movement (BM) > 4 days ago  Answer Assessment - Initial Assessment Questions 1. STOOL PATTERN OR FREQUENCY: "How often do you have a bowel movement (BM)?"  (Normal range: 3 times a day to every 3 days)  "When was your last BM?"       Every morning, last BM before my surgery on 06/06/23 2. STRAINING: "Do you have to strain to have a BM?"      Yes a little 3. RECTAL PAIN: "Does your rectum hurt when the stool comes out?" If Yes, ask: "Do you have hemorrhoids? How bad is the pain?"  (Scale 1-10; or mild, moderate, severe)     No 4. STOOL COMPOSITION: "Are the stools hard?"      Little hard, not big, balls 5. BLOOD ON STOOLS: "Has there been any blood on the toilet tissue or on the surface of the BM?" If Yes, ask: "When was the last time?"     No 6. CHRONIC CONSTIPATION: "Is this a new problem for you?"  If No, ask: "How long have you had this problem?" (days, weeks, months)      Yes 7. CHANGES IN DIET OR HYDRATION: "Have there been any recent changes in your diet?" "How much fluids are you drinking on a daily basis?"  "How much have you had to drink today?"     Not drinking a lot of fluids, changed how I eat 8. MEDICINES: "Have you been taking any new medicines?" "Are you taking any narcotic pain medicines?" (e.g., Dilaudid, morphine, Percocet, Vicodin)     Tramadol, dilaudid in the hospital and took 1 pill at home on Tuesday 9. LAXATIVES: "Have you been using any stool softeners, laxatives, or enemas?"  If Yes, ask "What, how often, and when was the last time?"     Dulcolax 2 pills every day, miralax 10. ACTIVITY:  "How much walking do you do every day?"  "Has your activity level decreased in the past week?"        Had hip surgery on 06/06/23 and in the hospital until  Tuesday; limited activity due to hip surgery 11. CAUSE: "What do you think is causing the constipation?"        I don't know, something is different 12. OTHER SYMPTOMS: "Do you have any other symptoms?" (e.g., abdomen pain, bloating, fever, vomiting)       Abdomen feels bloated  Protocols used: Constipation-A-AH

## 2023-06-13 NOTE — Telephone Encounter (Signed)
Chief Complaint: constipation Symptoms: hard balls, abd bloating Frequency: onset since having surgery Pertinent Negatives: Patient denies abd pain, other symptoms Disposition: [] ED /[] Urgent Care (no appt availability in office) / [x] Appointment(In office/virtual)/ []  [place] [facility]/ [] Home Care/ [] Refused Recommended Disposition /[] [place] [facility]/ []  Follow-up with PCP Additional Notes: Patient says she had hip surgery and was hospitalized from 06/06/23-06/10/23. She says she hasn't had a BM since before the surgery. She normally takes 2 Dulcolax pills and Miralax daily to have a BM and has one every morning. She says she's still taking the dulcolax and miralax, but only having balls of stool to come out and she has to strain, the color is pasty not brown, no blood, no pain with BM. Advised OV, scheduled with PCP on Monday.

## 2023-06-13 NOTE — Telephone Encounter (Signed)
Needs to do daily miralax, increase fiber in diet and use dulcolax if needed

## 2023-06-14 ENCOUNTER — Other Ambulatory Visit: Payer: Self-pay | Admitting: Nurse Practitioner

## 2023-06-14 DIAGNOSIS — E876 Hypokalemia: Secondary | ICD-10-CM

## 2023-06-16 ENCOUNTER — Encounter: Payer: Self-pay | Admitting: Nurse Practitioner

## 2023-06-16 ENCOUNTER — Ambulatory Visit (INDEPENDENT_AMBULATORY_CARE_PROVIDER_SITE_OTHER): Payer: Medicare Other | Admitting: Nurse Practitioner

## 2023-06-16 VITALS — BP 91/57 | HR 95 | Temp 97.6°F | Ht 66.0 in | Wt 139.0 lb

## 2023-06-16 DIAGNOSIS — K5903 Drug induced constipation: Secondary | ICD-10-CM

## 2023-06-16 NOTE — Progress Notes (Signed)
 Subjective:    Patient ID: Teresa Holloway, female    DOB: Dec 28, 1940, 83 y.o.   MRN: [id number]   Chief Complaint: constipation.  HPI  Patient had total hip replacement several months ago. Had to have surgery repeated on 06/06/23 because hip popped out of place. She is o pain meds which have caused severe constipation. She was told last week to use miralax daily in apple juice and take a dulcolax as needed. Today she says had normal boel movement today. She said this is the first time she did not have to strain. Patient Active Problem List   Diagnosis Date Noted   Recurrent dislocation of right hip 06/06/2023   Failed total hip arthroplasty (HCC) 06/05/2023   S/P total right hip arthroplasty 02/17/2023   Osteoarthritis of right hip 02/14/2023   Hypokalemia 09/05/2022   Anemia 02/05/2022   Constipation 10/29/2021   Helicobacter pylori gastritis 10/29/2021   Status post total left knee replacement 01/25/2019   Pain management contract agreement 02/22/2016   Opioid type dependence, continuous (HCC) 02/22/2016   BMI 30.0-30.9,adult 01/02/2015   Osteoarthritis of left hip 08/23/2013   Depression 08/23/2013   Osteoporosis 06/23/2013   Glaucoma of right eye 06/23/2013   CAD (coronary artery disease) 08/19/2012   History of MI (myocardial infarction) 08/19/2012   Arthritis of both knees 08/19/2012   Essential hypertension, benign 08/10/2012   Hyperlipidemia with target LDL less than 100 08/10/2012   GERD (gastroesophageal reflux disease) 08/10/2012       Review of Systems  Constitutional:  Negative for diaphoresis.  Eyes:  Negative for pain.  Respiratory:  Negative for shortness of breath.   Cardiovascular:  Negative for chest pain, palpitations and leg swelling.  Gastrointestinal:  Negative for abdominal pain.  Endocrine: Negative for polydipsia.  Skin:  Negative for rash.  Neurological:  Negative for dizziness, weakness and headaches.  Hematological:  Does not  bruise/bleed easily.  All other systems reviewed and are negative.      Objective:   Physical Exam Vitals and nursing note reviewed.  Constitutional:      General: She is not in acute distress.    Appearance: Normal appearance. She is well-developed.  Neck:     Vascular: No carotid bruit or JVD.  Cardiovascular:     Rate and Rhythm: Normal rate and regular rhythm.     Heart sounds: Normal heart sounds.  Pulmonary:     Effort: Pulmonary effort is normal. No respiratory distress.     Breath sounds: Normal breath sounds. No wheezing or rales.  Chest:     Chest wall: No tenderness.  Abdominal:     General: Bowel sounds are normal. There is no distension or abdominal bruit.     Palpations: Abdomen is soft. There is no hepatomegaly, splenomegaly, mass or pulsatile mass.     Tenderness: There is no abdominal tenderness.  Musculoskeletal:        General: Normal range of motion.     Cervical back: Normal range of motion and neck supple.  Lymphadenopathy:     Cervical: No cervical adenopathy.  Skin:    General: Skin is warm and dry.  Neurological:     Mental Status: She is alert and oriented to person, place, and time.     Deep Tendon Reflexes: Reflexes are normal and symmetric.  Psychiatric:        Behavior: Behavior normal.        Thought Content: Thought content normal.  Judgment: Judgment normal.    BP (!) 91/57   Pulse 95   Temp 97.6 F (36.4 C) (Temporal)   Ht 5\' 6"  (1.676 m)   Wt 139 lb (63 kg)   SpO2 97%   BMI 22.44 kg/m         Assessment & Plan:   Teresa Holloway in today with chief complaint of No chief complaint on file.   1. Drug-induced constipation (Primary) Force fluids Miralax in apple juice daily Increase fiber in diet RTO prn    The above assessment and management plan was discussed with the patient. The patient verbalized understanding of and has agreed to the management plan. Patient is aware to call the clinic if symptoms persist  or worsen. Patient is aware when to return to the clinic for a follow-up visit. Patient educated on when it is appropriate to go to the emergency department.   Mary-Margaret Daphine Deutscher, FNP

## 2023-06-16 NOTE — Patient Instructions (Signed)

## 2023-06-17 DIAGNOSIS — M25551 Pain in right hip: Secondary | ICD-10-CM | POA: Diagnosis not present

## 2023-06-19 DIAGNOSIS — M25651 Stiffness of right hip, not elsewhere classified: Secondary | ICD-10-CM | POA: Diagnosis not present

## 2023-06-19 DIAGNOSIS — Z96641 Presence of right artificial hip joint: Secondary | ICD-10-CM | POA: Diagnosis not present

## 2023-06-19 DIAGNOSIS — M6281 Muscle weakness (generalized): Secondary | ICD-10-CM | POA: Diagnosis not present

## 2023-06-24 ENCOUNTER — Encounter: Payer: Self-pay | Admitting: Nurse Practitioner

## 2023-06-24 ENCOUNTER — Ambulatory Visit: Payer: Medicare Other | Admitting: Nurse Practitioner

## 2023-06-24 DIAGNOSIS — Z96649 Presence of unspecified artificial hip joint: Secondary | ICD-10-CM | POA: Diagnosis not present

## 2023-06-24 DIAGNOSIS — T84018D Broken internal joint prosthesis, other site, subsequent encounter: Secondary | ICD-10-CM | POA: Diagnosis not present

## 2023-06-24 DIAGNOSIS — M6281 Muscle weakness (generalized): Secondary | ICD-10-CM | POA: Diagnosis not present

## 2023-06-24 DIAGNOSIS — M25651 Stiffness of right hip, not elsewhere classified: Secondary | ICD-10-CM | POA: Diagnosis not present

## 2023-06-24 DIAGNOSIS — Z96641 Presence of right artificial hip joint: Secondary | ICD-10-CM | POA: Diagnosis not present

## 2023-06-24 DIAGNOSIS — Z789 Other specified health status: Secondary | ICD-10-CM

## 2023-06-24 DIAGNOSIS — T84019D Broken internal joint prosthesis, unspecified site, subsequent encounter: Secondary | ICD-10-CM

## 2023-06-24 NOTE — Progress Notes (Signed)
 Subjective:    Patient ID: Teresa Holloway, female    DOB: [birth year], 83 y.o.   MRN: [id number]   Chief Complaint: transition of care  HPI Today's visit was for Transitional Care Management.  The patient was discharged from [facility] on 06/10/23 with a primary diagnosis of failed Total Hip athroplasty.   Contact with the patient and/or caregiver, by a clinical staff member, was made on 06/11/23 and was documented as a telephone encounter within the EMR.  Through chart review and discussion with the patient I have determined that management of their condition is of moderate complexity.    Patient was seen on 06/16/23 with constipation from pain meds. She had done some at home meds and was better. She says her hip was getting better nad was having less pain. She is still walking with a walker and is taking PT.    Patient Active Problem List   Diagnosis Date Noted   Recurrent dislocation of right hip 06/06/2023   Failed total hip arthroplasty (HCC) 06/05/2023   S/P total right hip arthroplasty 02/17/2023   Osteoarthritis of right hip 02/14/2023   Hypokalemia 09/05/2022   Anemia 02/05/2022   Constipation 10/29/2021   Helicobacter pylori gastritis 10/29/2021   Status post total left knee replacement 01/25/2019   Pain management contract agreement 02/22/2016   Opioid type dependence, continuous (HCC) 02/22/2016   BMI 30.0-30.9,adult 01/02/2015   Osteoarthritis of left hip 08/23/2013   Depression 08/23/2013   Osteoporosis 06/23/2013   Glaucoma of right eye 06/23/2013   CAD (coronary artery disease) 08/19/2012   History of MI (myocardial infarction) 08/19/2012   Arthritis of both knees 08/19/2012   Essential hypertension, benign 08/10/2012   Hyperlipidemia with target LDL less than 100 08/10/2012   GERD (gastroesophageal reflux disease) 08/10/2012       Review of Systems  Constitutional:  Negative for diaphoresis.  Eyes:  Negative for pain.  Respiratory:  Negative for  shortness of breath.   Cardiovascular:  Negative for chest pain, palpitations and leg swelling.  Gastrointestinal:  Negative for abdominal pain.  Endocrine: Negative for polydipsia.  Skin:  Negative for rash.  Neurological:  Negative for dizziness, weakness and headaches.  Hematological:  Does not bruise/bleed easily.  All other systems reviewed and are negative.      Objective:   Physical Exam Constitutional:      Appearance: Normal appearance.  Cardiovascular:     Rate and Rhythm: Normal rate and regular rhythm.     Heart sounds: Normal heart sounds.  Pulmonary:     Effort: Pulmonary effort is normal.     Breath sounds: Normal breath sounds.  Musculoskeletal:     Comments: Walking with walker  Skin:    General: Skin is warm.  Neurological:     General: No focal deficit present.     Mental Status: She is alert and oriented to person, place, and time.  Psychiatric:        Mood and Affect: Mood normal.    BP 107/61   Pulse 79   Temp 97.8 F (36.6 C) (Temporal)   Ht 5\' 6"  (1.676 m)   Wt 138 lb (62.6 kg)   SpO2 94%   BMI 22.27 kg/m         Assessment & Plan:   Teresa Holloway in today with chief complaint of Transitions Of Care   1. Transition of care Hospital records reviewed  2. Failure of arthroplasty, subsequent encounter Fall precautions Continue physical therapy  The above assessment and management plan was discussed with the patient. The patient verbalized understanding of and has agreed to the management plan. Patient is aware to call the clinic if symptoms persist or worsen. Patient is aware when to return to the clinic for a follow-up visit. Patient educated on when it is appropriate to go to the emergency department.   Mary-Margaret Daphine Deutscher, FNP

## 2023-06-27 ENCOUNTER — Other Ambulatory Visit: Payer: Self-pay | Admitting: Nurse Practitioner

## 2023-06-27 DIAGNOSIS — M1612 Unilateral primary osteoarthritis, left hip: Secondary | ICD-10-CM

## 2023-06-27 DIAGNOSIS — Z96641 Presence of right artificial hip joint: Secondary | ICD-10-CM | POA: Diagnosis not present

## 2023-06-27 DIAGNOSIS — M25651 Stiffness of right hip, not elsewhere classified: Secondary | ICD-10-CM | POA: Diagnosis not present

## 2023-06-27 DIAGNOSIS — M6281 Muscle weakness (generalized): Secondary | ICD-10-CM | POA: Diagnosis not present

## 2023-06-30 ENCOUNTER — Ambulatory Visit: Payer: Self-pay | Admitting: Nurse Practitioner

## 2023-06-30 NOTE — Telephone Encounter (Signed)
 Chief Complaint: Pain Symptoms: N/A Frequency: Chronic + surgery on 06/06/23 Pertinent Negatives: Patient denies relief Disposition: [] ED /[] Urgent Care (no appt availability in office) / [] Appointment(In office/virtual)/ []  [place] [facility]/ [] Home Care/ [] Refused Recommended Disposition /[] [place] [facility]/ [x]  Follow-up with PCP Additional Notes: Spoke to patient's daughter-in-law, Teresa Holloway, on behalf of the patient. Patient's tramadol was discontinued on 05/16/23. Teresa Holloway stated that the patient suffers from chronic pain and is experiencing pain from a recent shoulder surgery. Teresa Holloway stated that the patient was prescribed Dilaudid, but could not take it due to the side effects. Daughter is requested tramadol to be refilled to manage patient's pain. This RN advised that I would route this request to the office.   Copied from CRM [id number]. Topic: Clinical - Prescription Issue >> Jun 30, 2023 10:47 AM Teresa Holloway wrote: Reason for CRM: Daughter, Teresa Holloway, is calling in to have traMADol (ULTRAM) 50 MG tablet refilled and Teresa Holloway's medication list shows it stopped 05/16/23. Librarian, academic (Dr. Vear Clock) prescribed HYDROmorphone (DILAUDID) 2 MG tablet and she cannot take this medication because is gives her flu like systems. Dr. Vear Clock gave her a 30 days prescription of  traMADol with no refills and now she only has 3 left.  Please call daughter, Teresa Holloway, at [phone] Reason for Disposition  [1] Prescription refill request for NON-ESSENTIAL medicine (i.e., no harm to patient if med not taken) AND [2] triager unable to refill per department policy  Answer Assessment - Initial Assessment Questions 1. DRUG NAME: "What medicine do you need to have refilled?"     Tramadol 2. REFILLS REMAINING: "How many refills are remaining?" (Note: The label on the medicine or pill bottle will show how many refills are remaining. If there are no refills remaining, then a renewal may be needed.)     0 3. EXPIRATION  DATE: "What is the expiration date?" (Note: The label states when the prescription will expire, and thus can no longer be refilled.)     Medication has been discontinued 4. PRESCRIBING HCP: "Who prescribed it?" Reason: If prescribed by specialist, call should be referred to that group.     PCP 5. SYMPTOMS: "Do you have any symptoms?"     Chronic pain + pain from surgery on 2/14  Protocols used: Medication Refill and Renewal Call-A-AH

## 2023-06-30 NOTE — Progress Notes (Unsigned)
 GI Office Note    Referring Provider: Bennie Pierini, * Primary Care Physician:  Bennie Pierini, FNP  Primary Gastroenterologist: [name]. [name], DO   Chief Complaint   No chief complaint on file.   History of Present Illness   Teresa Holloway is a 83 y.o. female presenting today for follow-up.  She was last seen in December.  She has a history of chronic constipation, H. pylori gastritis status posttreatment and confirmed eradication by stool antigen x 2, anemia, CAD, MI, anxiety/depression, hyperlipidemia, hypertension.  After last office visit she had H. pylori stool antigen which was negative.  This is her fifth time she was tested.  Stool Hemoccult negative.  Since we last saw the patient she had revision of right total hip arthroplasty for recurrent dislocating right hip replacement on June 06, 2023.  Postoperatively she did have some decline in her hemoglobin from the typical 10 range down to 8.3.  She developed some postoperative constipation from pain medications.    Colonoscopy March 2023: - Non-bleeding internal hemorrhoids. - The examination was otherwise normal. - No specimens collected.   EGD March 2023: - Z-line regular, 39 cm from the incisors. - Gastritis. Biopsied. - Normal duodenal bulb, first portion of the duodenum and second portion of the duodenum. - Small hiatal hernia. -Gastric biopsy showed H. pylori associated gastritis.  Patient treated with clarithromycin and amoxicillin along with pantoprazole 40 mg twice daily.     Medications   Current Outpatient Medications  Medication Sig Dispense Refill   amLODipine (NORVASC) 5 MG tablet Take 1 tablet (5 mg total) by mouth daily. 90 tablet 1   aspirin EC 81 MG tablet Take 1 tablet (81 mg total) by mouth 2 (two) times daily. 60 tablet 0   atorvastatin (LIPITOR) 40 MG tablet TAKE 1 TABLET BY MOUTH DAILY AT 6 PM. 90 tablet 1   benazepril (LOTENSIN) 40 MG tablet Take 1 tablet  (40 mg total) by mouth daily. (Patient taking differently: Take 40 mg by mouth at bedtime.) 90 tablet 1   bisacodyl (DULCOLAX) 5 MG EC tablet Take 5 mg by mouth at bedtime.     cholecalciferol (VITAMIN D) 1000 UNITS tablet Take 1,000 Units by mouth daily.     cyanocobalamin (VITAMIN B12) 1000 MCG tablet Take 1,000 mcg by mouth daily.     escitalopram (LEXAPRO) 20 MG tablet Take 1 tablet (20 mg total) by mouth daily. **NEEDS TO BE SEEN BEFORE NEXT REFILL** 30 tablet 0   famotidine (PEPCID) 20 MG tablet Take 1 tablet (20 mg total) by mouth 2 (two) times daily. 30 tablet 0   ferrous sulfate 325 (65 FE) MG tablet Take 325 mg by mouth daily with breakfast.     hydrochlorothiazide (HYDRODIURIL) 25 MG tablet Take 1 tablet (25 mg total) by mouth daily. 90 tablet 1   HYDROmorphone (DILAUDID) 2 MG tablet Take 1 tablet (2 mg total) by mouth every 4 (four) hours as needed for severe pain (pain score 7-10). 30 tablet 0   isosorbide mononitrate (IMDUR) 60 MG 24 hr tablet TAKE 1 TABLET BY MOUTH EVERYDAY AT BEDTIME 90 tablet 1   metoprolol succinate (TOPROL-XL) 25 MG 24 hr tablet Take 1 tablet (25 mg total) by mouth daily. 30 tablet 11   nitroGLYCERIN (NITROSTAT) 0.4 MG SL tablet Place 1 tablet (0.4 mg total) under the tongue every 5 (five) minutes as needed for chest pain. 30 tablet 1   polyethylene glycol powder (GLYCOLAX/MIRALAX) 17 GM/SCOOP powder Take  17 g by mouth daily. 255 g 0   potassium chloride (KLOR-CON M10) 10 MEQ tablet TAKE 1 TABLET BY MOUTH 2 TIMES DAILY. 180 tablet 0   Probiotic CHEW Chew 1 capsule by mouth daily.     tiZANidine (ZANAFLEX) 2 MG tablet Take 1 tablet (2 mg total) by mouth every 6 (six) hours as needed for muscle spasms. 60 tablet 0   No current facility-administered medications for this visit.    Allergies   Allergies as of 07/01/2023 - Review Complete 06/24/2023  Allergen Reaction Noted   Atarax [hydroxyzine] Nausea And Vomiting 08/10/2012   Bactrim  [sulfamethoxazole-trimethoprim] Nausea Only 03/23/2015   Coreg [carvedilol] Other (See Comments) 08/10/2012     Past Medical History   Past Medical History:  Diagnosis Date   Anxiety    Arthritis    Knees, hips   CAD (coronary artery disease)    Chronic back pain    Depression    GERD (gastroesophageal reflux disease)    Hyperlipidemia    Hypertension    MI (myocardial infarction) (HCC)    Osteopenia     Past Surgical History   Past Surgical History:  Procedure Laterality Date   BIOPSY  07/16/2021   Procedure: BIOPSY;  Surgeon: Lanelle Bal, DO;  [facility];  Service: Endoscopy;;   COLONOSCOPY WITH PROPOFOL N/A 07/16/2021   Procedure: COLONOSCOPY WITH PROPOFOL;  Surgeon: Lanelle Bal, DO;  [facility];  Service: Endoscopy;  Laterality: N/A;  10:30am   ECTOPIC PREGNANCY SURGERY     ESOPHAGOGASTRODUODENOSCOPY (EGD) WITH PROPOFOL N/A 07/16/2021   Procedure: ESOPHAGOGASTRODUODENOSCOPY (EGD) WITH PROPOFOL;  Surgeon: Lanelle Bal, DO;  [facility];  Service: Endoscopy;  Laterality: N/A;   left breast biospy     left wrist fracture     TOTAL HIP ARTHROPLASTY Right 02/17/2023   Procedure: RIGHT TOTAL HIP ARTHROPLASTY ANTERIOR APPROACH;  Surgeon: Gean Birchwood, MD;  Location: [facility];  Service: Orthopedics;  Laterality: Right;   TOTAL HIP ARTHROPLASTY Right 06/06/2023   Procedure: TOTAL HIP ARTHROPLASTY ANTERIOR APPROACH;  Surgeon: Gean Birchwood, MD;  Location: [facility];  Service: Orthopedics;  Laterality: Right;   TOTAL KNEE ARTHROPLASTY Left 01/25/2019   Procedure: LEFT TOTAL KNEE ARTHROPLASTY;  Surgeon: Gean Birchwood, MD;  Location: [facility];  Service: Orthopedics;  Laterality: Left;    Past Family History   Family History  Problem Relation Age of Onset   Asthma Father    Diabetes Sister    Diabetes Brother     Past Social History   Social History   Socioeconomic History   Marital status: Widowed    Spouse name: [name]   Number  of children: 1   Years of education: Not on file   Highest education level: 10th grade  Occupational History   Occupation: Retired  Tobacco Use   Smoking status: Never   Smokeless tobacco: Never  Vaping Use   Vaping status: Never Used  Substance and Sexual Activity   Alcohol use: No   Drug use: No   Sexual activity: Not Currently    Birth control/protection: Post-menopausal  Other Topics Concern   Not on file  Social History Narrative   Husband passed 12/2020.    1 son-[name].   2 grandchildren   Twin great grandchildren.   Social Drivers of Corporate investment banker Strain: Low Risk  (05/08/2023)   Overall Financial Resource Strain (CARDIA)    Difficulty of Paying Living Expenses: Not hard at  all  Food Insecurity: No Food Insecurity (06/11/2023)   Hunger Vital Sign    Worried About Running Out of Food in the Last Year: Never true    Ran Out of Food in the Last Year: Never true  Transportation Needs: No Transportation Needs (06/11/2023)   PRAPARE - Administrator, Civil Service (Medical): No    Lack of Transportation (Non-Medical): No  Physical Activity: Sufficiently Active (08/21/2022)   Exercise Vital Sign    Days of Exercise per Week: 5 days    Minutes of Exercise per Session: 30 min  Stress: No Stress Concern Present (08/21/2022)   [employer] of Occupational Health - Occupational Stress Questionnaire    Feeling of Stress : Not at all  Social Connections: Moderately Isolated (06/07/2023)   Social Connection and Isolation Panel [NHANES]    Frequency of Communication with Friends and Family: More than three times a week    Frequency of Social Gatherings with Friends and Family: More than three times a week    Attends Religious Services: 1 to 4 times per year    Active Member of [employer] or Organizations: No    Attends Banker Meetings: Never    Marital Status: Widowed  Intimate Partner Violence: Not At Risk (06/11/2023)   Humiliation, Afraid,  Rape, and Kick questionnaire    Fear of Current or Ex-Partner: No    Emotionally Abused: No    Physically Abused: No    Sexually Abused: No    Review of Systems   General: Negative for anorexia, weight loss, fever, chills, fatigue, weakness. ENT: Negative for hoarseness, difficulty swallowing , nasal congestion. CV: Negative for chest pain, angina, palpitations, dyspnea on exertion, peripheral edema.  Respiratory: Negative for dyspnea at rest, dyspnea on exertion, cough, sputum, wheezing.  GI: See history of present illness. GU:  Negative for dysuria, hematuria, urinary incontinence, urinary frequency, nocturnal urination.  Endo: Negative for unusual weight change.     Physical Exam   There were no vitals taken for this visit.   General: Well-nourished, well-developed in no acute distress.  Eyes: No icterus. Mouth: Oropharyngeal mucosa moist and pink , no lesions erythema or exudate. Lungs: Clear to auscultation bilaterally.  Heart: Regular rate and rhythm, no murmurs rubs or gallops.  Abdomen: Bowel sounds are normal, nontender, nondistended, no hepatosplenomegaly or masses,  no abdominal bruits or hernia , no rebound or guarding.  Rectal: ***  Extremities: No lower extremity edema. No clubbing or deformities. Neuro: Alert and oriented x 4   Skin: Warm and dry, no jaundice.   Psych: Alert and cooperative, normal mood and affect.  Labs   *** Imaging Studies   DG HIP UNILAT WITH PELVIS 1V RIGHT Result Date: 06/06/2023 CLINICAL DATA:  Elective surgery. EXAM: DG HIP (WITH OR WITHOUT PELVIS) 1V RIGHT COMPARISON:  Radiograph 05/27/2023 FINDINGS: Eight fluoroscopic spot views of the pelvis and right hip obtained in the operating room. Sequential images during revision hip arthroplasty. Fluoroscopy time 19 seconds. Dose 1.73 mGy. IMPRESSION: Procedural fluoroscopy during right hip arthroplasty revision. Electronically Signed   By: Narda Rutherford M.D.   On: 06/06/2023 16:11   DG  C-Arm 1-60 Min-No Report Result Date: 06/06/2023 Fluoroscopy was utilized by the requesting physician.  No radiographic interpretation.   DG C-Arm 1-60 Min-No Report Result Date: 06/06/2023 Fluoroscopy was utilized by the requesting physician.  No radiographic interpretation.    Assessment       PLAN   ***   Leanna Battles.  Melvyn Neth, MHS, PA-C [facility]

## 2023-07-01 ENCOUNTER — Ambulatory Visit: Payer: Medicare Other | Admitting: Gastroenterology

## 2023-07-01 ENCOUNTER — Encounter: Payer: Self-pay | Admitting: Gastroenterology

## 2023-07-01 VITALS — BP 127/70 | HR 73 | Temp 97.5°F | Ht 66.0 in | Wt 137.2 lb

## 2023-07-01 DIAGNOSIS — D509 Iron deficiency anemia, unspecified: Secondary | ICD-10-CM | POA: Diagnosis not present

## 2023-07-01 DIAGNOSIS — K5900 Constipation, unspecified: Secondary | ICD-10-CM | POA: Diagnosis not present

## 2023-07-01 DIAGNOSIS — M6281 Muscle weakness (generalized): Secondary | ICD-10-CM | POA: Diagnosis not present

## 2023-07-01 DIAGNOSIS — Z96641 Presence of right artificial hip joint: Secondary | ICD-10-CM | POA: Diagnosis not present

## 2023-07-01 DIAGNOSIS — M25572 Pain in left ankle and joints of left foot: Secondary | ICD-10-CM | POA: Diagnosis not present

## 2023-07-01 MED ORDER — LUBIPROSTONE 24 MCG PO CAPS: 24.0000 ug | ORAL_CAPSULE | Freq: Two times a day (BID) | ORAL | 5 refills | Status: DC

## 2023-07-01 NOTE — Patient Instructions (Addendum)
 Start lubiprostone twice daily with food for constipation. You may note increased stools, watery stools especially the first week on this medication. If you have more than 5 stools in 24 hours, please reduce dose to once daily or skip a day.   Stop miralax.  Continue probiotic daily.  Limit foods in the list below. These foods can increase gas/bloating. If you are eating something from this list multiple times per week, please reduce to once per week.   Please update labs in 6 weeks.   Foods to limit/avoid due to gas/bloating Fruits Fresh, dried, and juiced forms of apple, pear, watermelon, peach, plum, cherries, apricots, blackberries, boysenberries, figs, nectarines, and mango. Avocado. Vegetables Chicory root, artichoke, asparagus, cabbage, snow peas, Brussels sprouts, broccoli, sugar snap peas, mushrooms, celery, and cauliflower. Onions, garlic, leeks, and the white part of scallions. Grains Wheat, including kamut, durum, and semolina. Barley and bulgur. Couscous. Wheat-based cereals. Wheat noodles, bread, crackers, and pastries. Meats and other proteins Fried or fatty meat. Sausage. Cashews and pistachios. Soybeans, baked beans, black beans, chickpeas, kidney beans, fava beans, navy beans, lentils, black-eyed peas, and split peas. Dairy Milk, yogurt, ice cream, and soft cheese. Cream and sour cream. Milk-based sauces. Custard. Buttermilk. Soy milk. Seasoning and other foods Any sugar-free gum or candy. Foods that contain artificial sweeteners such as sorbitol, mannitol, isomalt, or xylitol. Foods that contain honey, high-fructose corn syrup, or agave. Bouillon, vegetable stock, beef stock, and chicken stock. Garlic and onion powder. Condiments made with onion, such as hummus, chutney, pickles, relish, salad dressing, and salsa. Tomato paste. Beverages Chicory-based drinks. Coffee substitutes. Chamomile tea. Fennel tea. Sweet or fortified wines such as port or sherry. Diet soft  drinks made with isomalt, mannitol, maltitol, sorbitol, or xylitol. Apple, pear, and mango juice. Juices with high-fructose corn syrup. The items listed above may not be a complete list of foods and beverages you should avoid. Contact a dietitian for more information.

## 2023-07-03 ENCOUNTER — Ambulatory Visit: Admitting: Nurse Practitioner

## 2023-07-03 ENCOUNTER — Other Ambulatory Visit: Payer: Self-pay | Admitting: Nurse Practitioner

## 2023-07-03 DIAGNOSIS — F3342 Major depressive disorder, recurrent, in full remission: Secondary | ICD-10-CM

## 2023-07-03 NOTE — Telephone Encounter (Signed)
 MMM pt NTBS 30-d given 06/11/23

## 2023-07-03 NOTE — Telephone Encounter (Signed)
 APPT SCHEDULED 07/10/23

## 2023-07-04 DIAGNOSIS — M6281 Muscle weakness (generalized): Secondary | ICD-10-CM | POA: Diagnosis not present

## 2023-07-04 DIAGNOSIS — Z96641 Presence of right artificial hip joint: Secondary | ICD-10-CM | POA: Diagnosis not present

## 2023-07-04 DIAGNOSIS — M25651 Stiffness of right hip, not elsewhere classified: Secondary | ICD-10-CM | POA: Diagnosis not present

## 2023-07-09 ENCOUNTER — Other Ambulatory Visit: Payer: Self-pay | Admitting: Nurse Practitioner

## 2023-07-09 DIAGNOSIS — F3342 Major depressive disorder, recurrent, in full remission: Secondary | ICD-10-CM

## 2023-07-10 ENCOUNTER — Encounter: Payer: Self-pay | Admitting: Nurse Practitioner

## 2023-07-10 ENCOUNTER — Ambulatory Visit (INDEPENDENT_AMBULATORY_CARE_PROVIDER_SITE_OTHER): Admitting: Nurse Practitioner

## 2023-07-10 VITALS — BP 110/66 | HR 69 | Temp 97.2°F | Ht 65.0 in | Wt 136.0 lb

## 2023-07-10 DIAGNOSIS — E876 Hypokalemia: Secondary | ICD-10-CM | POA: Diagnosis not present

## 2023-07-10 DIAGNOSIS — B9681 Helicobacter pylori [H. pylori] as the cause of diseases classified elsewhere: Secondary | ICD-10-CM

## 2023-07-10 DIAGNOSIS — Z6830 Body mass index (BMI) 30.0-30.9, adult: Secondary | ICD-10-CM

## 2023-07-10 DIAGNOSIS — R6889 Other general symptoms and signs: Secondary | ICD-10-CM | POA: Diagnosis not present

## 2023-07-10 DIAGNOSIS — D649 Anemia, unspecified: Secondary | ICD-10-CM

## 2023-07-10 DIAGNOSIS — K5900 Constipation, unspecified: Secondary | ICD-10-CM

## 2023-07-10 DIAGNOSIS — M25551 Pain in right hip: Secondary | ICD-10-CM

## 2023-07-10 DIAGNOSIS — I25111 Atherosclerotic heart disease of native coronary artery with angina pectoris with documented spasm: Secondary | ICD-10-CM

## 2023-07-10 DIAGNOSIS — K2970 Gastritis, unspecified, without bleeding: Secondary | ICD-10-CM | POA: Diagnosis not present

## 2023-07-10 DIAGNOSIS — I10 Essential (primary) hypertension: Secondary | ICD-10-CM

## 2023-07-10 DIAGNOSIS — Z0000 Encounter for general adult medical examination without abnormal findings: Secondary | ICD-10-CM | POA: Diagnosis not present

## 2023-07-10 DIAGNOSIS — K219 Gastro-esophageal reflux disease without esophagitis: Secondary | ICD-10-CM

## 2023-07-10 DIAGNOSIS — M810 Age-related osteoporosis without current pathological fracture: Secondary | ICD-10-CM

## 2023-07-10 DIAGNOSIS — Z0001 Encounter for general adult medical examination with abnormal findings: Secondary | ICD-10-CM | POA: Diagnosis not present

## 2023-07-10 DIAGNOSIS — E785 Hyperlipidemia, unspecified: Secondary | ICD-10-CM | POA: Diagnosis not present

## 2023-07-10 DIAGNOSIS — F3342 Major depressive disorder, recurrent, in full remission: Secondary | ICD-10-CM

## 2023-07-10 DIAGNOSIS — G8929 Other chronic pain: Secondary | ICD-10-CM

## 2023-07-10 MED ORDER — POTASSIUM CHLORIDE CRYS ER 10 MEQ PO TBCR: 10.0000 meq | EXTENDED_RELEASE_TABLET | Freq: Two times a day (BID) | ORAL | 0 refills | Status: DC

## 2023-07-10 MED ORDER — ISOSORBIDE MONONITRATE ER 60 MG PO TB24: ORAL_TABLET | ORAL | 1 refills | Status: DC

## 2023-07-10 MED ORDER — TRAMADOL HCL 50 MG PO TABS: 100.0000 mg | ORAL_TABLET | Freq: Two times a day (BID) | ORAL | 3 refills | Status: DC

## 2023-07-10 MED ORDER — BENAZEPRIL HCL 40 MG PO TABS: 40.0000 mg | ORAL_TABLET | Freq: Every day | ORAL | 1 refills | Status: DC

## 2023-07-10 MED ORDER — HYDROCHLOROTHIAZIDE 25 MG PO TABS: 25.0000 mg | ORAL_TABLET | Freq: Every day | ORAL | 1 refills | Status: DC

## 2023-07-10 MED ORDER — ATORVASTATIN CALCIUM 40 MG PO TABS: ORAL_TABLET | ORAL | 1 refills | Status: DC

## 2023-07-10 MED ORDER — ESCITALOPRAM OXALATE 20 MG PO TABS: 20.0000 mg | ORAL_TABLET | Freq: Every day | ORAL | 0 refills | Status: DC

## 2023-07-10 MED ORDER — AMLODIPINE BESYLATE 5 MG PO TABS: 5.0000 mg | ORAL_TABLET | Freq: Every day | ORAL | 1 refills | Status: DC

## 2023-07-10 MED ORDER — FAMOTIDINE 20 MG PO TABS: 20.0000 mg | ORAL_TABLET | Freq: Two times a day (BID) | ORAL | 0 refills | Status: DC

## 2023-07-10 MED ORDER — FERROUS SULFATE 325 (65 FE) MG PO TABS: 325.0000 mg | ORAL_TABLET | Freq: Every day | ORAL | 1 refills | Status: DC

## 2023-07-10 NOTE — Progress Notes (Signed)
 Subjective:    Patient ID: Teresa Holloway, female    DOB: 01-24-1941, 83 y.o.   MRN: [id number]   Chief Complaint: annual physical    HPI:  Teresa Holloway is a 83 y.o. who identifies as a female who was assigned female at birth.   Social history: Lives with: by herself-her husband passed away in the last year Work history: retired   Water engineer in today for follow up of the following chronic medical issues:  1. Essential hypertension, benign No c/o chest pain, sob or headache. Does not check blood pressure at home. BP Readings from Last 3 Encounters:  07/01/23 127/70  06/24/23 107/61  06/16/23 (!) 91/57     2. Hyperlipidemia with target LDL less than 100 Does not watch diet and does no exercise due to chronic hip pain. Lab Results  Component Value Date   CHOL 116 09/05/2022   HDL 67 09/05/2022   LDLCALC 36 09/05/2022   TRIG 59 09/05/2022   CHOLHDL 1.7 09/05/2022     3. Coronary artery disease involving native coronary artery of native heart with angina pectoris with documented spasm [facility]) Last saw cardiology 02/26/22. Review of office note - no change in plan of care  4. Gastroesophageal reflux disease, unspecified whether esophagitis present Uses OTC meds when needed  5. Helicobacter pylori gastritis Had some gastritis last week and had stool hpylori test which came back negative. She denies any constipation or diarrhea.  6. Anemia, unspecified type No fatigue.  Lab Results  Component Value Date   HGB 8.3 (L) 06/09/2023     7. Constipation, unspecified constipation type Doing well right now  8. Recurrent major depressive disorder, in full remission (HCC) Is on lexapro and is doing well    03/17/2023    2:27 PM 02/11/2023   10:42 AM 01/20/2023   10:23 AM 01/02/2023   11:01 AM  GAD 7 : Generalized Anxiety Score  Nervous, Anxious, on Edge 1 2 1  0  Control/stop worrying 0 2 1 0  Worry too much - different things 0 2 1 0  Trouble relaxing 1 0 1 0   Restless 0 0 0 0  Easily annoyed or irritable 1 2 1 2   Afraid - awful might happen 0 0 0 0  Total GAD 7 Score 3 8 5 2   Anxiety Difficulty Somewhat difficult Somewhat difficult Not difficult at all Not difficult at all      9. Age related osteoporosis, unspecified pathological fracture presence// Last dexascan was done on 12/04/21. T score was -2.5. she doe sno weight bearing exercises due to her chronic hip pain.  10. Chronic hip pain Pain assessment: Cause of pain- hip replacement Pain location- right hip Pain on scale of 1-10- 5/10 today Frequency- daily What increases pain-walking to much What makes pain Better-pain meds help Effects on ADL - none Any change in general medical condition-9one  Current opioids rx- ultram # meds rx- 60 Effectiveness of current meds-helps Adverse reactions from pain meds-none Morphine equivalent-  Pill count performed-No Last drug screen - 12/16/22 ( high risk q75m, moderate risk q19m, low risk yearly ) Urine drug screen today- No Was the N[facility]SR reviewed- yes  If yes were their any concerning findings? - no   Overdose risk: 1    06/06/2022   10:33 AM  Opioid Risk   Alcohol 0  Illegal Drugs 0  Rx Drugs 0  Alcohol 0  Illegal Drugs 0  Rx Drugs 0  Age between  16-45 years  0  History of Preadolescent Sexual Abuse 0  Psychological Disease 0  Depression 0  Opioid Risk Tool Scoring 0  Opioid Risk Interpretation Low Risk     Pain contract signed on:    89. BMI 30.0-30.9,adult No recent weight changes Wt Readings from Last 3 Encounters:  07/01/23 137 lb 3.2 oz (62.2 kg)  06/24/23 138 lb (62.6 kg)  06/16/23 139 lb (63 kg)   BMI Readings from Last 3 Encounters:  07/01/23 22.14 kg/m  06/24/23 22.27 kg/m  06/16/23 22.44 kg/m     New complaints: None today  Allergies  Allergen Reactions   Atarax [Hydroxyzine] Nausea And Vomiting   Bactrim [Sulfamethoxazole-Trimethoprim] Nausea Only   Coreg [Carvedilol] Other (See  Comments)    Unknown   Outpatient Encounter Medications as of 07/10/2023  Medication Sig   amLODipine (NORVASC) 5 MG tablet Take 1 tablet (5 mg total) by mouth daily.   aspirin EC 81 MG tablet Take 1 tablet (81 mg total) by mouth 2 (two) times daily.   atorvastatin (LIPITOR) 40 MG tablet TAKE 1 TABLET BY MOUTH DAILY AT 6 PM.   benazepril (LOTENSIN) 40 MG tablet Take 1 tablet (40 mg total) by mouth daily. (Patient taking differently: Take 40 mg by mouth at bedtime.)   cholecalciferol (VITAMIN D) 1000 UNITS tablet Take 1,000 Units by mouth daily.   cyanocobalamin (VITAMIN B12) 1000 MCG tablet Take 1,000 mcg by mouth daily.   escitalopram (LEXAPRO) 20 MG tablet Take 1 tablet (20 mg total) by mouth daily.   famotidine (PEPCID) 20 MG tablet Take 1 tablet (20 mg total) by mouth 2 (two) times daily.   ferrous sulfate 325 (65 FE) MG tablet Take 325 mg by mouth daily with breakfast.   hydrochlorothiazide (HYDRODIURIL) 25 MG tablet Take 1 tablet (25 mg total) by mouth daily.   isosorbide mononitrate (IMDUR) 60 MG 24 hr tablet TAKE 1 TABLET BY MOUTH EVERYDAY AT BEDTIME   lubiprostone (AMITIZA) 24 MCG capsule Take 1 capsule (24 mcg total) by mouth 2 (two) times daily with a meal.   nitroGLYCERIN (NITROSTAT) 0.4 MG SL tablet Place 1 tablet (0.4 mg total) under the tongue every 5 (five) minutes as needed for chest pain.   potassium chloride (KLOR-CON M10) 10 MEQ tablet TAKE 1 TABLET BY MOUTH 2 TIMES DAILY.   Probiotic CHEW Chew 1 capsule by mouth daily.   traMADol (ULTRAM) 50 MG tablet Take by mouth. Takes 2 daily   No facility-administered encounter medications on file as of 07/10/2023.    Past Surgical History:  Procedure Laterality Date   BIOPSY  07/16/2021   Procedure: BIOPSY;  Surgeon: Lanelle Bal, DO;  [facility];  Service: Endoscopy;;   COLONOSCOPY WITH PROPOFOL N/A 07/16/2021   Procedure: COLONOSCOPY WITH PROPOFOL;  Surgeon: Lanelle Bal, DO;  [facility];   Service: Endoscopy;  Laterality: N/A;  10:30am   ECTOPIC PREGNANCY SURGERY     ESOPHAGOGASTRODUODENOSCOPY (EGD) WITH PROPOFOL N/A 07/16/2021   Procedure: ESOPHAGOGASTRODUODENOSCOPY (EGD) WITH PROPOFOL;  Surgeon: Lanelle Bal, DO;  [facility];  Service: Endoscopy;  Laterality: N/A;   left breast biospy     left wrist fracture     TOTAL HIP ARTHROPLASTY Right 02/17/2023   Procedure: RIGHT TOTAL HIP ARTHROPLASTY ANTERIOR APPROACH;  Surgeon: Gean Birchwood, MD;  [facility];  Service: Orthopedics;  Laterality: Right;   TOTAL HIP ARTHROPLASTY Right 06/06/2023   Procedure: TOTAL HIP ARTHROPLASTY ANTERIOR APPROACH;  Surgeon:  Gean Birchwood, MD;  [facility];  Service: Orthopedics;  Laterality: Right;   TOTAL KNEE ARTHROPLASTY Left 01/25/2019   Procedure: LEFT TOTAL KNEE ARTHROPLASTY;  Surgeon: Gean Birchwood, MD;  [facility];  Service: Orthopedics;  Laterality: Left;    Family History  Problem Relation Age of Onset   Asthma Father    Diabetes Sister    Diabetes Brother       Controlled substance contract: n/a     Review of Systems  Constitutional:  Negative for diaphoresis.  Eyes:  Negative for pain.  Respiratory:  Negative for shortness of breath.   Cardiovascular:  Negative for chest pain, palpitations and leg swelling.  Gastrointestinal:  Negative for abdominal pain.  Endocrine: Negative for polydipsia.  Skin:  Negative for rash.  Neurological:  Negative for dizziness, weakness and headaches.  Hematological:  Does not bruise/bleed easily.  All other systems reviewed and are negative.      Objective:   Physical Exam Vitals and nursing note reviewed.  Constitutional:      General: She is not in acute distress.    Appearance: Normal appearance. She is well-developed.  HENT:     Head: Normocephalic.     Right Ear: Tympanic membrane normal.     Left Ear: Tympanic membrane normal.     Nose: Nose normal.     Mouth/Throat:     Mouth: Mucous membranes  are moist.  Eyes:     Pupils: Pupils are equal, round, and reactive to light.  Neck:     Vascular: No carotid bruit or JVD.  Cardiovascular:     Rate and Rhythm: Normal rate and regular rhythm.     Heart sounds: Normal heart sounds.  Pulmonary:     Effort: Pulmonary effort is normal. No respiratory distress.     Breath sounds: Normal breath sounds. No wheezing or rales.  Chest:     Chest wall: No tenderness.  Abdominal:     General: Bowel sounds are normal. There is no distension or abdominal bruit.     Palpations: Abdomen is soft. There is no hepatomegaly, splenomegaly, mass or pulsatile mass.     Tenderness: There is no abdominal tenderness.  Musculoskeletal:        General: Normal range of motion.     Cervical back: Normal range of motion and neck supple.     Comments: Walking with cane Gait slow and steady   Lymphadenopathy:     Cervical: No cervical adenopathy.  Skin:    General: Skin is warm and dry.  Neurological:     Mental Status: She is alert and oriented to person, place, and time.     Deep Tendon Reflexes: Reflexes are normal and symmetric.  Psychiatric:        Behavior: Behavior normal.        Thought Content: Thought content normal.        Judgment: Judgment normal.     BP 110/66   Pulse 69   Temp (!) 97.2 F (36.2 C)   Ht 5\' 5"  (1.651 m)   Wt 136 lb (61.7 kg)   SpO2 93%   BMI 22.63 kg/m           Assessment & Plan:   ARYEL EDELEN comes in today with chief complaint of Annual Exam   Diagnosis and orders addressed:  1. Annual physical exam  2. Essential hypertension, benign Low sodium diet  3. Hyperlipidemia with target LDL less than 100 Low fat diet  4. Coronary artery disease involving native coronary artery of native heart with angina pectoris with documented spasm (HCC) Keep year;y follow up with cardiology  5. Gastroesophageal reflux disease, unspecified whether esophagitis present Avoid spicy foods Do not eat 2 hours  prior to bedtime  6. Helicobacter pylori gastritis negative  7. Anemia, unspecified type Labs pending  8. Constipation, unspecified constipation type  9. Recurrent major depressive disorder, in full remission (HCC) Stress management  10. Age related osteoporosis, unspecified pathological fracture presence Cannot tolerate weight bearing exercise currently  11. BMI 30.0-30.9,adult Discussed diet and exercise for person with BMI >25 Will recheck weight in 3-6 months   12. Hip pain Ultram 50mg  bid as prescribed  Labs pending Health Maintenance reviewed Diet and exercise encouraged  Follow up plan: 3 months   Mary-Margaret Daphine Deutscher, FNP

## 2023-07-10 NOTE — Addendum Note (Signed)
 Addended by: Bennie Pierini on: 07/10/2023 04:40 PM   Modules accepted: Level of Service

## 2023-07-10 NOTE — Patient Instructions (Signed)
 Fall Prevention in the Home, Adult Falls can cause injuries and can happen to people of all ages. There are many things you can do to make your home safer and to help prevent falls. What actions can I take to prevent falls? General information Use good lighting in all rooms. Make sure to: Replace any light bulbs that burn out. Turn on the lights in dark areas and use night-lights. Keep items that you use often in easy-to-reach places. Lower the shelves around your home if needed. Move furniture so that there are clear paths around it. Do not use throw rugs or other things on the floor that can make you trip. If any of your floors are uneven, fix them. Add color or contrast paint or tape to clearly mark and help you see: Grab bars or handrails. First and last steps of staircases. Where the edge of each step is. If you use a ladder or stepladder: Make sure that it is fully opened. Do not climb a closed ladder. Make sure the sides of the ladder are locked in place. Have someone hold the ladder while you use it. Know where your pets are as you move through your home. What can I do in the bathroom?     Keep the floor dry. Clean up any water on the floor right away. Remove soap buildup in the bathtub or shower. Buildup makes bathtubs and showers slippery. Use non-skid mats or decals on the floor of the bathtub or shower. Attach bath mats securely with double-sided, non-slip rug tape. If you need to sit down in the shower, use a non-slip stool. Install grab bars by the toilet and in the bathtub and shower. Do not use towel bars as grab bars. What can I do in the bedroom? Make sure that you have a light by your bed that is easy to reach. Do not use any sheets or blankets on your bed that hang to the floor. Have a firm chair or bench with side arms that you can use for support when you get dressed. What can I do in the kitchen? Clean up any spills right away. If you need to reach something  above you, use a step stool with a grab bar. Keep electrical cords out of the way. Do not use floor polish or wax that makes floors slippery. What can I do with my stairs? Do not leave anything on the stairs. Make sure that you have a light switch at the top and the bottom of the stairs. Make sure that there are handrails on both sides of the stairs. Fix handrails that are broken or loose. Install non-slip stair treads on all your stairs if they do not have carpet. Avoid having throw rugs at the top or bottom of the stairs. Choose a carpet that does not hide the edge of the steps on the stairs. Make sure that the carpet is firmly attached to the stairs. Fix carpet that is loose or worn. What can I do on the outside of my home? Use bright outdoor lighting. Fix the edges of walkways and driveways and fix any cracks. Clear paths of anything that can make you trip, such as tools or rocks. Add color or contrast paint or tape to clearly mark and help you see anything that might make you trip as you walk through a door, such as a raised step or threshold. Trim any bushes or trees on paths to your home. Check to see if handrails are loose  or broken and that both sides of all steps have handrails. Install guardrails along the edges of any raised decks and porches. Have leaves, snow, or ice cleared regularly. Use sand, salt, or ice melter on paths if you live where there is ice and snow during the winter. Clean up any spills in your garage right away. This includes grease or oil spills. What other actions can I take? Review your medicines with your doctor. Some medicines can cause dizziness or changes in blood pressure, which increase your risk of falling. Wear shoes that: Have a low heel. Do not wear high heels. Have rubber bottoms and are closed at the toe. Feel good on your feet and fit well. Use tools that help you move around if needed. These include: Canes. Walkers. Scooters. Crutches. Ask  your doctor what else you can do to help prevent falls. This may include seeing a physical therapist to learn to do exercises to move better and get stronger. Where to find more information Centers for Disease Control and Prevention, [employer]: TonerPromos.no [employer] on Aging: BaseRingTones.pl National Institute on Aging: BaseRingTones.pl Contact a doctor if: You are afraid of falling at home. You feel weak, drowsy, or dizzy at home. You fall at home. Get help right away if you: Lose consciousness or have trouble moving after a fall. Have a fall that causes a head injury. These symptoms may be an emergency. Get help right away. Call 911. Do not wait to see if the symptoms will go away. Do not drive yourself to the hospital. This information is not intended to replace advice given to you by your health care provider. Make sure you discuss any questions you have with your health care provider. Document Revised: 12/10/2021 Document Reviewed: 12/10/2021 Elsevier Patient Education  2024 [employer].

## 2023-07-11 LAB — LIPID PANEL
Chol/HDL Ratio: 1.7 ratio (ref 0.0–4.4)
Cholesterol, Total: 125 mg/dL (ref 100–199)
HDL: 72 mg/dL (ref >39)
LDL Chol Calc (NIH): 39 mg/dL (ref 0–99)
Triglycerides: 66 mg/dL (ref 0–149)
VLDL Cholesterol Cal: 14 mg/dL (ref 5–40)

## 2023-07-11 LAB — CBC WITH DIFFERENTIAL/PLATELET
Basophils Absolute: 0.1 10*3/uL (ref 0.0–0.2)
Basos: 1 %
EOS (ABSOLUTE): 0.1 10*3/uL (ref 0.0–0.4)
Eos: 2 %
Hematocrit: 31.8 % — ABNORMAL LOW (ref 34.0–46.6)
Hemoglobin: 10.1 g/dL — ABNORMAL LOW (ref 11.1–15.9)
Immature Grans (Abs): 0 10*3/uL (ref 0.0–0.1)
Immature Granulocytes: 0 %
Lymphocytes Absolute: 1 10*3/uL (ref 0.7–3.1)
Lymphs: 21 %
MCH: 29.3 pg (ref 26.6–33.0)
MCHC: 31.8 g/dL (ref 31.5–35.7)
MCV: 92 fL (ref 79–97)
Monocytes Absolute: 0.5 10*3/uL (ref 0.1–0.9)
Monocytes: 10 %
Neutrophils Absolute: 3.1 10*3/uL (ref 1.4–7.0)
Neutrophils: 66 %
Platelets: 252 10*3/uL (ref 150–450)
RBC: 3.45 x10E6/uL — ABNORMAL LOW (ref 3.77–5.28)
RDW: 14.6 % (ref 11.7–15.4)
WBC: 4.8 10*3/uL (ref 3.4–10.8)

## 2023-07-11 LAB — CMP14+EGFR
ALT: 5 IU/L (ref 0–32)
AST: 11 IU/L (ref 0–40)
Albumin: 4 g/dL (ref 3.7–4.7)
Alkaline Phosphatase: 110 IU/L (ref 44–121)
BUN/Creatinine Ratio: 15 (ref 12–28)
BUN: 15 mg/dL (ref 8–27)
Bilirubin Total: 0.6 mg/dL (ref 0.0–1.2)
CO2: 27 mmol/L (ref 20–29)
Calcium: 9.7 mg/dL (ref 8.7–10.3)
Chloride: 101 mmol/L (ref 96–106)
Creatinine, Ser: 1.01 mg/dL — ABNORMAL HIGH (ref 0.57–1.00)
Globulin, Total: 2.5 g/dL (ref 1.5–4.5)
Glucose: 80 mg/dL (ref 70–99)
Potassium: 4 mmol/L (ref 3.5–5.2)
Sodium: 142 mmol/L (ref 134–144)
Total Protein: 6.5 g/dL (ref 6.0–8.5)
eGFR: 56 mL/min/{1.73_m2} — ABNORMAL LOW (ref >59)

## 2023-07-11 LAB — THYROID PANEL WITH TSH
Free Thyroxine Index: 2.2 (ref 1.2–4.9)
T3 Uptake Ratio: 29 % (ref 24–39)
T4, Total: 7.6 ug/dL (ref 4.5–12.0)
TSH: 0.597 u[IU]/mL (ref 0.450–4.500)

## 2023-07-11 LAB — VITAMIN D 25 HYDROXY (VIT D DEFICIENCY, FRACTURES): Vit D, 25-Hydroxy: 50.5 ng/mL (ref 30.0–100.0)

## 2023-07-15 DIAGNOSIS — M542 Cervicalgia: Secondary | ICD-10-CM | POA: Diagnosis not present

## 2023-07-16 DIAGNOSIS — M25651 Stiffness of right hip, not elsewhere classified: Secondary | ICD-10-CM | POA: Diagnosis not present

## 2023-07-16 DIAGNOSIS — Z96641 Presence of right artificial hip joint: Secondary | ICD-10-CM | POA: Diagnosis not present

## 2023-07-16 DIAGNOSIS — M6281 Muscle weakness (generalized): Secondary | ICD-10-CM | POA: Diagnosis not present

## 2023-07-22 DIAGNOSIS — S161XXD Strain of muscle, fascia and tendon at neck level, subsequent encounter: Secondary | ICD-10-CM | POA: Diagnosis not present

## 2023-07-22 DIAGNOSIS — Z96641 Presence of right artificial hip joint: Secondary | ICD-10-CM | POA: Diagnosis not present

## 2023-07-22 DIAGNOSIS — M25651 Stiffness of right hip, not elsewhere classified: Secondary | ICD-10-CM | POA: Diagnosis not present

## 2023-07-22 DIAGNOSIS — M6281 Muscle weakness (generalized): Secondary | ICD-10-CM | POA: Diagnosis not present

## 2023-07-23 DIAGNOSIS — Z96641 Presence of right artificial hip joint: Secondary | ICD-10-CM | POA: Diagnosis not present

## 2023-07-23 DIAGNOSIS — M25651 Stiffness of right hip, not elsewhere classified: Secondary | ICD-10-CM | POA: Diagnosis not present

## 2023-07-23 DIAGNOSIS — S161XXD Strain of muscle, fascia and tendon at neck level, subsequent encounter: Secondary | ICD-10-CM | POA: Diagnosis not present

## 2023-07-23 DIAGNOSIS — M6281 Muscle weakness (generalized): Secondary | ICD-10-CM | POA: Diagnosis not present

## 2023-08-12 DIAGNOSIS — M25651 Stiffness of right hip, not elsewhere classified: Secondary | ICD-10-CM | POA: Diagnosis not present

## 2023-08-21 DIAGNOSIS — S161XXD Strain of muscle, fascia and tendon at neck level, subsequent encounter: Secondary | ICD-10-CM | POA: Diagnosis not present

## 2023-08-25 ENCOUNTER — Telehealth: Payer: Self-pay

## 2023-08-25 NOTE — Telephone Encounter (Signed)
 Copied from CRM [id number]. Topic: General - Other >> Aug 25, 2023  2:28 PM Sophia H wrote: Reason for CRM: Spoke with daughter in law who states pt should be getting a call from the nurse tomorrow, best contact # added to appt. Pt Ph # [phone]

## 2023-08-26 ENCOUNTER — Ambulatory Visit: Payer: Medicare Other

## 2023-08-26 VITALS — BP 110/66 | HR 69 | Ht 65.0 in | Wt 136.0 lb

## 2023-08-26 DIAGNOSIS — Z0000 Encounter for general adult medical examination without abnormal findings: Secondary | ICD-10-CM | POA: Diagnosis not present

## 2023-08-26 NOTE — Progress Notes (Signed)
 Subjective:   Teresa Holloway is a 83 y.o. who presents for a Medicare Wellness preventive visit.  Visit Complete: Virtual I connected with  Dejanique A Beller on 08/26/23 by a audio enabled telemedicine application and verified that I am speaking with the correct person using two identifiers.  Patient Location: Home  Provider Location: Home Office  I discussed the limitations of evaluation and management by telemedicine. The patient expressed understanding and agreed to proceed.  Vital Signs: Because this visit was a virtual/telehealth visit, some criteria may be missing or patient reported. Any vitals not documented were not able to be obtained and vitals that have been documented are patient reported.  VideoDeclined- This patient declined Librarian, academic. Therefore the visit was completed with audio only.  Persons Participating in Visit: Patient.  AWV Questionnaire: No: Patient Medicare AWV questionnaire was not completed prior to this visit.  Cardiac Risk Factors include: advanced age (>59men, >21 women)     Objective:    Today's Vitals   08/26/23 1241 08/26/23 1243  BP: 110/66   Pulse: 69   Weight: 136 lb (61.7 kg)   Height: 5\' 5"  (1.651 m)   PainSc:  8    Body mass index is 22.63 kg/m.     08/26/2023    1:07 PM 06/08/2023    1:00 AM 06/02/2023    8:59 AM 05/27/2023    2:51 PM 05/02/2023   12:31 PM 04/19/2023    1:24 PM 02/28/2023    1:21 PM  Advanced Directives  Does Patient Have a Medical Advance Directive? No No No No No No No  Would patient like information on creating a medical advance directive?  No - Patient declined  No - Patient declined  No - Patient declined     Current Medications (verified) Outpatient Encounter Medications as of 08/26/2023  Medication Sig   amLODipine  (NORVASC ) 5 MG tablet Take 1 tablet (5 mg total) by mouth daily.   aspirin  EC 81 MG tablet Take 1 tablet (81 mg total) by mouth 2 (two) times daily.    atorvastatin  (LIPITOR) 40 MG tablet TAKE 1 TABLET BY MOUTH DAILY AT 6 PM.   benazepril  (LOTENSIN ) 40 MG tablet Take 1 tablet (40 mg total) by mouth daily.   cholecalciferol  (VITAMIN D ) 1000 UNITS tablet Take 1,000 Units by mouth daily.   cyanocobalamin  (VITAMIN B12) 1000 MCG tablet Take 1,000 mcg by mouth daily.   escitalopram  (LEXAPRO ) 20 MG tablet Take 1 tablet (20 mg total) by mouth daily.   famotidine  (PEPCID ) 20 MG tablet Take 1 tablet (20 mg total) by mouth 2 (two) times daily.   ferrous sulfate  325 (65 FE) MG tablet Take 1 tablet (325 mg total) by mouth daily with breakfast.   hydrochlorothiazide  (HYDRODIURIL ) 25 MG tablet Take 1 tablet (25 mg total) by mouth daily.   isosorbide  mononitrate (IMDUR ) 60 MG 24 hr tablet TAKE 1 TABLET BY MOUTH EVERYDAY AT BEDTIME   lubiprostone  (AMITIZA ) 24 MCG capsule Take 1 capsule (24 mcg total) by mouth 2 (two) times daily with a meal.   nitroGLYCERIN  (NITROSTAT ) 0.4 MG SL tablet Place 1 tablet (0.4 mg total) under the tongue every 5 (five) minutes as needed for chest pain.   potassium chloride  (KLOR-CON  M10) 10 MEQ tablet Take 1 tablet (10 mEq total) by mouth 2 (two) times daily.   Probiotic CHEW Chew 1 capsule by mouth daily.   traMADol  (ULTRAM ) 50 MG tablet Take 2 tablets (100 mg total) by mouth  2 (two) times daily. Takes 2 daily   No facility-administered encounter medications on file as of 08/26/2023.    Allergies (verified) Atarax [hydroxyzine], Bactrim  [sulfamethoxazole -trimethoprim ], and Coreg [carvedilol]   History: Past Medical History:  Diagnosis Date   Anxiety    Arthritis    Knees, hips   CAD (coronary artery disease)    Chronic back pain    Depression    GERD (gastroesophageal reflux disease)    Hyperlipidemia    Hypertension    MI (myocardial infarction) (HCC)    Osteopenia    Past Surgical History:  Procedure Laterality Date   BIOPSY  07/16/2021   Procedure: BIOPSY;  Surgeon: Vinetta Greening, DO;  [facility]: [facility];   Service: Endoscopy;;   COLONOSCOPY WITH PROPOFOL  N/A 07/16/2021   Procedure: COLONOSCOPY WITH PROPOFOL ;  Surgeon: Vinetta Greening, DO;  Location: [facility];  Service: Endoscopy;  Laterality: N/A;  10:30am   ECTOPIC PREGNANCY SURGERY     ESOPHAGOGASTRODUODENOSCOPY (EGD) WITH PROPOFOL  N/A 07/16/2021   Procedure: ESOPHAGOGASTRODUODENOSCOPY (EGD) WITH PROPOFOL ;  Surgeon: Vinetta Greening, DO;  Location: [facility];  Service: Endoscopy;  Laterality: N/A;   left breast biospy     left wrist fracture     TOTAL HIP ARTHROPLASTY Right 02/17/2023   Procedure: RIGHT TOTAL HIP ARTHROPLASTY ANTERIOR APPROACH;  Surgeon: Wendolyn Hamburger, MD;  Location: [facility];  Service: Orthopedics;  Laterality: Right;   TOTAL HIP ARTHROPLASTY Right 06/06/2023   Procedure: TOTAL HIP ARTHROPLASTY ANTERIOR APPROACH;  Surgeon: Wendolyn Hamburger, MD;  Location: [facility];  Service: Orthopedics;  Laterality: Right;   TOTAL KNEE ARTHROPLASTY Left 01/25/2019   Procedure: LEFT TOTAL KNEE ARTHROPLASTY;  Surgeon: Wendolyn Hamburger, MD;  Location: [facility];  Service: Orthopedics;  Laterality: Left;   Family History  Problem Relation Age of Onset   Asthma Father    Diabetes Sister    Diabetes Brother    Social History   Socioeconomic History   Marital status: Widowed    Spouse name: [name]   Number of children: 1   Years of education: Not on file   Highest education level: 10th grade  Occupational History   Occupation: Retired  Tobacco Use   Smoking status: Never   Smokeless tobacco: Never  Vaping Use   Vaping status: Never Used  Substance and Sexual Activity   Alcohol use: No   Drug use: No   Sexual activity: Not Currently    Birth control/protection: Post-menopausal  Other Topics Concern   Not on file  Social History Narrative   Husband passed 12/2020.    1 son-Treg Diemer.   2 grandchildren   Twin great grandchildren.   Social Drivers of Corporate investment banker Strain: Low Risk  (08/26/2023)   Overall Financial Resource  Strain (CARDIA)    Difficulty of Paying Living Expenses: Not hard at all  Food Insecurity: No Food Insecurity (08/26/2023)   Hunger Vital Sign    Worried About Running Out of Food in the Last Year: Never true    Ran Out of Food in the Last Year: Never true  Transportation Needs: No Transportation Needs (08/26/2023)   PRAPARE - Administrator, Civil Service (Medical): No    Lack of Transportation (Non-Medical): No  Physical Activity: Insufficiently Active (08/26/2023)   Exercise Vital Sign    Days of Exercise per Week: 7 days    Minutes of Exercise per Session: 20 min  Stress: No Stress Concern Present (08/26/2023)   [employer]  of Occupational Health - Occupational Stress Questionnaire    Feeling of Stress : Not at all  Social Connections: Socially Isolated (08/26/2023)   Social Connection and Isolation Panel [NHANES]    Frequency of Communication with Friends and Family: More than three times a week    Frequency of Social Gatherings with Friends and Family: Never    Attends Religious Services: Never    Database administrator or Organizations: No    Attends Banker Meetings: Never    Marital Status: Widowed    Tobacco Counseling Counseling given: Yes    Clinical Intake:  Pre-visit preparation completed: Yes  Pain : 0-10 (neck pain and sometimes it pop and can not turn, f/u w/pcp) Pain Score: 8  Pain Location: Neck Pain Orientation: Mid Pain Onset: Other (comment) (about a month now) Pain Frequency: Constant Pain Relieving Factors: Tramadol /tylenol  Effect of Pain on Daily Activities: still does everything minus taking a bath, hard to move pt to move her neck  Pain Relieving Factors: Tramadol /tylenol   BMI - recorded: 22.63 Nutritional Status: BMI of 19-24  Normal Nutritional Risks: None Diabetes: No  No results found for: "HGBA1C"   How often do you need to have someone help you when you read instructions, pamphlets, or other written materials  from your doctor or pharmacy?: 1 - Never  Interpreter Needed?: No  Information entered by :: Alia T/cma   Activities of Daily Living     08/26/2023    1:02 PM 06/10/2023    8:55 AM  In your present state of health, do you have any difficulty performing the following activities:  Hearing? 0   Vision? 0   Difficulty concentrating or making decisions? 1   Comment pt's son helps w/decision making   Walking or climbing stairs? 0   Dressing or bathing? 1   Comment pt has trouble w/bathing due to fear of falling   Doing errands, shopping? 1 1  Comment pt's son/daughter in law taks pt to the drs office ie shopping   Preparing Food and eating ? N   Using the Toilet? N   In the past six months, have you accidently leaked urine? N   Do you have problems with loss of bowel control? N   Managing your Medications? N   Comment pt's son put the pills in the pill box for pt to take   Managing your Finances? N   Housekeeping or managing your Housekeeping? N     Patient Care Team: Delfina Feller, FNP as PCP - General (Nurse Practitioner) Jonette Nestle, [facility], Donata Fryer, [facility] as [employer] Care Management (Pharmacist) Alexia Idler, OD (Optometry)  Indicate any recent Medical Services you may have received from other than [facility] providers in the past year (date may be approximate).     Assessment:   This is a routine wellness examination for Mahika.  Hearing/Vision screen Hearing Screening - Comments:: Pt denies hearing dif Vision Screening - Comments:: pt wears glasses; pt goes to [facility], [place]   Goals Addressed             This Visit's Progress    DIET - INCREASE WATER  INTAKE   On track    08/26/23 per pt still continuing to increase water  intake by adding lemon slice in it, its easier to drink       Depression Screen     08/26/2023    1:12 PM 04/24/2023    3:23 PM 03/17/2023  2:26 PM 02/11/2023   10:41 AM 01/20/2023   10:23 AM  01/02/2023   11:00 AM 12/16/2022    2:26 PM  PHQ 2/9 Scores  PHQ - 2 Score 1 3 1 1 3  0 0  PHQ- 9 Score 3  3 5 8 2 4     Fall Risk     08/26/2023    1:00 PM 06/16/2023    3:02 PM 03/17/2023    2:26 PM 02/11/2023   10:41 AM 01/20/2023   10:22 AM  Fall Risk   Falls in the past year? 0 0 1 1 1   Number falls in past yr: 0  0 0 0  Injury with Fall? 0  0 0 0  Risk for fall due to : No Fall Risks  History of fall(s) History of fall(s) History of fall(s)  Follow up Falls prevention discussed;Falls evaluation completed  Education provided Education provided Education provided    MEDICARE RISK AT HOME:  Medicare Risk at Home Any stairs in or around the home?: Yes If so, are there any without handrails?: Yes Home free of loose throw rugs in walkways, pet beds, electrical cords, etc?: Yes Adequate lighting in your home to reduce risk of falls?: Yes Life alert?: Yes Use of a cane, walker or w/c?: Yes (cane) Grab bars in the bathroom?: No Shower chair or bench in shower?: Yes (pt is afraid to trip w/the chair) Elevated toilet seat or a handicapped toilet?: No  TIMED UP AND GO:  Was the test performed?  no  Cognitive Function: 6CIT completed        08/26/2023    1:17 PM 08/21/2022    1:01 PM 07/04/2021    2:17 PM 05/18/2020    1:21 PM 09/18/2018    3:38 PM  6CIT Screen  What Year? 4 points 0 points 0 points 0 points 0 points  What month? 3 points 0 points 0 points  0 points  What time? 0 points 0 points 0 points 0 points 0 points  Count back from 20 0 points 0 points 0 points 0 points 0 points  Months in reverse 4 points 2 points 4 points 4 points 2 points  Repeat phrase 2 points 2 points 4 points 2 points 4 points  Total Score 13 points 4 points 8 points  6 points    Immunizations Immunization History  Administered Date(s) Administered   Fluad Quad(high Dose 65+) 03/07/2020, 03/08/2021, 03/11/2022   Fluad Trivalent(High Dose 65+) 01/02/2023   Influenza, High Dose Seasonal PF  02/22/2016, 03/07/2017, 03/13/2018   Influenza,inj,Quad PF,6+ Mos 02/20/2015   Moderna Sars-Covid-2 Vaccination 06/23/2019, 07/21/2019, 05/16/2020   Pneumococcal Conjugate-13 07/01/2014   Pneumococcal Polysaccharide-23 07/14/2015   Tdap 06/16/2020   Zoster, Live 12/30/2013    Screening Tests Health Maintenance  Topic Date Due   Zoster Vaccines- Shingrix (1 of 2) 01/04/1960   COVID-19 Vaccine (4 - 2024-25 season) 09/10/2024 (Originally 12/22/2022)   INFLUENZA VACCINE  11/21/2023   DEXA SCAN  12/05/2023   Medicare Annual Wellness (AWV)  08/25/2024   DTaP/Tdap/Td (2 - Td or Tdap) 06/16/2030   Pneumonia Vaccine 64+ Years old  Completed   HPV VACCINES  Aged Out   Meningococcal B Vaccine  Aged Out    Health Maintenance  Health Maintenance Due  Topic Date Due   Zoster Vaccines- Shingrix (1 of 2) 01/04/1960   Health Maintenance Items Addressed: See Nurse Notes  Additional Screening:  Vision Screening: Recommended annual ophthalmology exams for early detection of glaucoma  and other disorders of the eye.  Dental Screening: Recommended annual dental exams for proper oral hygiene  Community Resource Referral / Chronic Care Management: CRR required this visit?  No   CCM required this visit?  No     Plan:     I have personally reviewed and noted the following in the patient's chart:   Medical and social history Use of alcohol, tobacco or illicit drugs  Current medications and supplements including opioid prescriptions. Patient is not currently taking opioid prescriptions. Functional ability and status Nutritional status Physical activity Advanced directives List of other physicians Hospitalizations, surgeries, and ER visits in previous 12 months Vitals Screenings to include cognitive, depression, and falls Referrals and appointments  In addition, I have reviewed and discussed with patient certain preventive protocols, quality metrics, and best practice recommendations.  A written personalized care plan for preventive services as well as general preventive health recommendations were provided to patient.     Michaelle Adolphus, CMA   08/26/2023   After Visit Summary: (Declined) Due to this being a telephonic visit, with patients personalized plan was offered to patient but patient Declined AVS at this time   Notes: Clinician Recommendations: Please don't forget to get your second Shingle shot at your next visit with your provider. Pt has appt w/pcp tom at 315pm due to neck pain pt has had for almost a month now.

## 2023-08-26 NOTE — Patient Instructions (Signed)
 Ms. Krenzke , Thank you for taking time to come for your Medicare Wellness Visit. I appreciate your ongoing commitment to your health goals. Please review the following plan we discussed and let me know if I can assist you in the future.   Referrals/Orders/Follow-Ups/Clinician Recommendations: Please don't forget to get your second Shingle shot at your next visit with your provider.   This is a list of the screening recommended for you and due dates:  Health Maintenance  Topic Date Due   Zoster (Shingles) Vaccine (1 of 2) 01[birth year]04/1960   COVID-19 Vaccine (4 - 2024-25 season) 09/10/2024*   Flu Shot  11/21/2023   DEXA scan (bone density measurement)  12/05/2023   Medicare Annual Wellness Visit  08/25/2024   DTaP/Tdap/Td vaccine (2 - Td or Tdap) 06/16/2030   Pneumonia Vaccine  Completed   HPV Vaccine  Aged Out   Meningitis B Vaccine  Aged Out  *Topic was postponed. The date shown is not the original due date.    Advanced directives: (Declined) Advance directive discussed with you today. Even though you declined this today, please call our office should you change your mind, and we can give you the proper paperwork for you to fill out.  Next Medicare Annual Wellness Visit scheduled for next year: Yes

## 2023-08-28 ENCOUNTER — Ambulatory Visit: Admitting: Nurse Practitioner

## 2023-08-28 ENCOUNTER — Encounter: Payer: Self-pay | Admitting: Nurse Practitioner

## 2023-08-28 VITALS — BP 129/74 | HR 67 | Temp 97.5°F | Ht 65.0 in | Wt 135.0 lb

## 2023-08-28 DIAGNOSIS — M542 Cervicalgia: Secondary | ICD-10-CM | POA: Diagnosis not present

## 2023-08-28 NOTE — Patient Instructions (Signed)
Cervical Sprain A cervical sprain is also called a neck sprain. It is a stretch or tear in one or more ligaments in the neck. Ligaments are tissues that connect bones to each other. Neck sprains can be mild, bad, or very bad. A very bad sprain can cause problems with bones and other structures.  Most neck sprains heal in 4-6 weeks, but this depends on how bad the injury is. What are the causes? Neck sprains may be caused by trauma, such as: An injury from a motor vehicle accident. A fall. The head and neck being moved front to back or side to side all of a sudden (whiplash injury). Mild neck sprains may be caused by wear and tear over time. What increases the risk? Some activities put you at risk of hurting your neck. These include: Contact sports. Gymnastics. Diving. Arthritis caused by wear and tear of the joints in the spine. The neck not being very strong or flexible. Having had a neck injury in the past. Poor posture. Spending a lot of time in positions that put stress on the neck. This may be from sitting at a computer for a long time. What are the signs or symptoms? Signs or symptoms include any of these problems in your neck, shoulders, or upper back: Pain or tenderness. Stiffness. Swelling. A burning feeling. Sudden tightening of neck muscles (spasms). Not being able to move the neck very much. Headache. Feeling dizzy. Feeling like you may vomit, or vomiting. Having a hand or arm that: Feels weak. Loses feeling (feels numb). Tingles. How is this treated? This condition is treated by: Resting and putting ice or heat on your neck. Doing exercises to improve movement and strength in the injured area (physical therapy). In some cases, treatment may also include: Keeping your neck in place for a length of time. This may be done using: A neck collar. This supports your chin and the back of your head. A cervical traction device. This is a sling that holds up your head. It  removes weight and pressure from your neck. Medicines for pain or other symptoms. Surgery. This is rare. Follow these instructions at home: Medicines Take over-the-counter and prescription medicines only as told by your doctor. Ask your doctor if you should avoid driving or using machines while you are taking your medicine. If told, take steps to prevent problems with pooping (constipation). You may need to: Drink enough fluid to keep your pee (urine) pale yellow. Take medicines. You will be told what medicines to take. Eat foods that are high in fiber. These include beans, whole grains, and fresh fruits and vegetables. Limit foods that are high in fat and sugar. These include fried or sweet foods. If you have a neck collar: Wear it as told by your doctor. Do not take it off unless told. Ask your doctor before adjusting your collar. If you have long hair, keep it outside of the collar. If you are allowed to take off the collar for cleaning and bathing: Follow instructions about how to take it off safely. Clean it by hand with mild soap and water. Let it air-dry fully. If your collar has pads that you can take out: Take the pads out every 1-2 days. Wash them by hand with soap and water. Let the pads air-dry fully before you put them back in the collar. Tell your doctor if your skin under the collar has irritation or sores. Managing pain, stiffness, and swelling     Use a  cervical traction device, if told by your doctor. If told, put ice on the affected area. Put ice in a plastic bag. Place a towel between your skin and the bag. Leave the ice on for 20 minutes, 2-3 times a day. If told, put heat on the affected area. Do this before exercise or as often as told by your doctor. Use the heat source that your doctor recommends, such as a moist heat pack or a heating pad. Place a towel between your skin and the heat source. Leave the heat on for 20-30 minutes. If your skin turns bright  red, take off the ice or heat right away to prevent skin damage. The risk of damage is higher if you cannot feel pain, heat, or cold. Activity Do not drive while wearing a neck collar. If you do not have a neck collar, ask if it is safe to drive while your neck heals. Do not lift anything that is heavier than 10 lb (4.5 kg). Rest as told by your doctor. Avoid positions and activities that make you feel worse. Do exercises as told by your doctor or physical therapist. Return to your normal activities when your doctor says that it is safe. General instructions Do not smoke or use any products that contain nicotine or tobacco. These can delay healing. If you need help quitting, ask your doctor. Keep all follow-up visits. Your doctor will check your injury and activity level. How is this prevented? Use good posture. Adjust your workstation to help with this. Exercise often as told by your doctor or physical therapist. Avoid activities that are risky or may cause a neck sprain. Contact a doctor if: Your symptoms get worse or do not get better after 2 weeks. Your pain gets worse. Medicine does not help your pain. You have new symptoms. Your neck collar gives you sores on your skin or bothers your skin. Get help right away if: You have very bad pain. You get any of the following in any part of your body: Loss of feeling. Tingling. Weakness. You cannot move a part of your body. You have neck pain and either of these: Very bad dizziness. A very bad headache. This information is not intended to replace advice given to you by your health care provider. Make sure you discuss any questions you have with your health care provider. Document Revised: 11/09/2021 Document Reviewed: 11/09/2021 Elsevier Patient Education  2024 [employer].

## 2023-08-28 NOTE — Progress Notes (Signed)
 Subjective:    Patient ID: Teresa Holloway, female    DOB: [birth year], 83 y.o.   MRN: [id number]   Chief Complaint: Neck Pain   Neck Pain  Pertinent negatives include no chest pain, headaches or weakness.   Patient had annual wellness visit yesterday. Patient told nurse she was having neck pain. She has been having this for several months. She has been seeing Dr. Carry Clapper who did her hip replacement. He told her it was arthritis in her neck. He sent her for physical therapy. She is set up for 5 more sessions. He told her the would discuss MRI once she has completed P. Patient Active Problem List   Diagnosis Date Noted   Recurrent dislocation of right hip 06/06/2023   Failed total hip arthroplasty (HCC) 06/05/2023   S/P total right hip arthroplasty 02/17/2023   Osteoarthritis of right hip 02/14/2023   Hypokalemia 09/05/2022   Chronic ischemic heart disease 02/26/2022   Anemia 02/05/2022   Constipation 10/29/2021   Helicobacter pylori gastritis 10/29/2021   Status post total left knee replacement 01/25/2019   Pain management contract agreement 02/22/2016   Opioid type dependence, continuous (HCC) 02/22/2016   BMI 30.0-30.9,adult 01/02/2015   Osteoarthritis of left hip 08/23/2013   Depression 08/23/2013   Osteoporosis 06/23/2013   Glaucoma of right eye 06/23/2013   CAD (coronary artery disease) 08/19/2012   History of MI (myocardial infarction) 08/19/2012   Arthritis of both knees 08/19/2012   Essential hypertension, benign 08/10/2012   Hyperlipidemia with target LDL less than 100 08/10/2012   GERD (gastroesophageal reflux disease) 08/10/2012       Review of Systems  Constitutional:  Negative for diaphoresis.  Eyes:  Negative for pain.  Respiratory:  Negative for shortness of breath.   Cardiovascular:  Negative for chest pain, palpitations and leg swelling.  Gastrointestinal:  Negative for abdominal pain.  Endocrine: Negative for polydipsia.  Musculoskeletal:  Positive  for neck pain.  Skin:  Negative for rash.  Neurological:  Negative for dizziness, weakness and headaches.  Hematological:  Does not bruise/bleed easily.  All other systems reviewed and are negative.      Objective:   Physical Exam Constitutional:      Appearance: Normal appearance.  Cardiovascular:     Rate and Rhythm: Normal rate and regular rhythm.     Heart sounds: Normal heart sounds.  Pulmonary:     Effort: Pulmonary effort is normal.     Breath sounds: Normal breath sounds.  Musculoskeletal:     Right lower leg: Edema (1+) present.     Comments: FROM of cervical spine with pain on flexion and extension- pops when moving in any direction.  GRIPS EQUAL BIL Motor strength and sensation distally intact  Skin:    General: Skin is warm.  Neurological:     General: No focal deficit present.     Mental Status: She is alert and oriented to person, place, and time.  Psychiatric:        Mood and Affect: Mood normal.        Behavior: Behavior normal.    BP 129/74   Pulse 67   Temp (!) 97.5 F (36.4 C) (Temporal)   Ht 5\' 5"  (1.651 m)   Wt 135 lb (61.2 kg)   SpO2 93%   BMI 22.47 kg/m         Assessment & Plan:  Teresa Holloway in today with chief complaint of Neck Pain   1. Cervical pain (Primary) Moist  heat Rest Continue physical therapy Keep follow up appt with Dr. Carry Clapper    The above assessment and management plan was discussed with the patient. The patient verbalized understanding of and has agreed to the management plan. Patient is aware to call the clinic if symptoms persist or worsen. Patient is aware when to return to the clinic for a follow-up visit. Patient educated on when it is appropriate to go to the emergency department.   Mary-Margaret Gaylyn Keas, FNP

## 2023-09-02 DIAGNOSIS — S161XXD Strain of muscle, fascia and tendon at neck level, subsequent encounter: Secondary | ICD-10-CM | POA: Diagnosis not present

## 2023-09-03 ENCOUNTER — Other Ambulatory Visit: Payer: Self-pay | Admitting: Nurse Practitioner

## 2023-09-03 DIAGNOSIS — F3342 Major depressive disorder, recurrent, in full remission: Secondary | ICD-10-CM

## 2023-09-09 DIAGNOSIS — S161XXD Strain of muscle, fascia and tendon at neck level, subsequent encounter: Secondary | ICD-10-CM | POA: Diagnosis not present

## 2023-09-11 DIAGNOSIS — S161XXD Strain of muscle, fascia and tendon at neck level, subsequent encounter: Secondary | ICD-10-CM | POA: Diagnosis not present

## 2023-09-17 DIAGNOSIS — S161XXD Strain of muscle, fascia and tendon at neck level, subsequent encounter: Secondary | ICD-10-CM | POA: Diagnosis not present

## 2023-09-26 DIAGNOSIS — S161XXD Strain of muscle, fascia and tendon at neck level, subsequent encounter: Secondary | ICD-10-CM | POA: Diagnosis not present

## 2023-10-02 ENCOUNTER — Ambulatory Visit: Payer: Self-pay

## 2023-10-02 NOTE — Telephone Encounter (Signed)
 FYI Only or Action Required?: Action required by provider  Patient was last seen in primary care on 08/28/2023 by Delfina Feller, FNP. Called Nurse Triage reporting dementia Symptoms began 3 years ago but worsened after surgery last September. Interventions attempted: Nothing. Symptoms are: gradually worsening.  Triage Disposition: See Physician Within 24 Hours  Patient/caregiver understands and will follow disposition?: family needs appt for next week.          Copied from CRM [id number]. Topic: Clinical - Red Word Triage >> Oct 02, 2023 10:47 AM Georgeann Kindred wrote: Red Word that prompted transfer to Nurse Triage: Daughter in law, Ashlee Player, called due to patient experiencing an altered mental status that is worsening. She states that her mother in law is showing signs of dementia that is worsening. She states that she is forgetful not remembering days and worsening mood swings where she lashes out at her son, Irva Loser, and then is fine a moment later. Reason for Disposition . [1] Worsening confusion AND [2] gradual onset (days to weeks)  Answer Assessment - Initial Assessment Questions 1. MAIN CONCERN OR SYMPTOM:  What is your main concern right now? What questions do you have? What's the main symptom you're worried about? (e.g., confusion, memory loss)     Confused, lashing out to son, forgetful, mood swings 2. ONSET:  When did the symptom start (or worsen)? (minutes, hours, days, weeks)     3 years  3. BETTER-SAME-WORSE: Are you (the patient) getting better, staying the same, or getting worse compared to the day you (they) were diagnosed or most recent hospital discharge ?     Worse after surgery in October 4. DIAGNOSIS: Was the dementia diagnosed by a doctor? If Yes, ask: When? (e.g., days, months, years ago)     no 5. MEDICINES: Has there been any change in medicines recently? (e.g., narcotics, antihistamines, benzodiazepines, etc.)     no 6. OTHER  SYMPTOMS: Are there any other symptoms? (e.g., fever, cough, pain, falling)     Multiple physical orthopedic complaint, paranoid, short term memory loss, memory and behavior varies  and can change quickly  7. SUPPORT: Document living circumstances and support (e.g., family, nursing home)    Lives with son Arvell Latin Thursday night  Protocols used: Dementia Symptoms and Questions-A-AH

## 2023-10-03 DIAGNOSIS — S161XXD Strain of muscle, fascia and tendon at neck level, subsequent encounter: Secondary | ICD-10-CM | POA: Diagnosis not present

## 2023-10-07 ENCOUNTER — Encounter: Payer: Self-pay | Admitting: Nurse Practitioner

## 2023-10-07 ENCOUNTER — Ambulatory Visit: Admitting: Nurse Practitioner

## 2023-10-07 DIAGNOSIS — G8929 Other chronic pain: Secondary | ICD-10-CM

## 2023-10-07 DIAGNOSIS — M25551 Pain in right hip: Secondary | ICD-10-CM

## 2023-10-07 MED ORDER — TRAMADOL HCL 50 MG PO TABS
100.0000 mg | ORAL_TABLET | Freq: Two times a day (BID) | ORAL | 3 refills | Status: DC
Start: 2023-10-07 — End: 2024-01-06

## 2023-10-07 NOTE — Progress Notes (Signed)
 Subjective:    Patient ID: Teresa Holloway, female    DOB: 07/12/1940, 83 y.o.   MRN: [id number]   Chief Complaint: pain management  HPI  Patient has severe osteoarthritis of hip and needs total hip replacement but her family does not want her to have surgery. Pain assessment: Cause of pain- OA of hip, and now chronic neck pain Pain location- right hip Pain on scale of 1-10- 7-8/10 Frequency- daily What increases pain-nothing What makes pain Better- rest helps Effects on ADL - none Any change in general medical condition-none  Current opioids rx- ultram  # meds rx- 60 Effectiveness of current meds-helps Adverse reactions from pain meds-none Morphine equivalent-  Pill count performed-No Last drug screen - never ( high risk q10m, moderate risk q55m, low risk yearly ) Urine drug screen today- Yes Was the NCCSR reviewed- yes  If yes were their any concerning findings? - no   Overdose risk: 1    06/06/2022   10:33 AM  Opioid Risk   Alcohol 0  Illegal Drugs 0  Rx Drugs 0  Alcohol 0  Illegal Drugs 0  Rx Drugs 0  Age between 16-45 years  0  History of Preadolescent Sexual Abuse 0  Psychological Disease 0  Depression 0  Opioid Risk Tool Scoring 0  Opioid Risk Interpretation Low Risk     Pain contract signed on:12/12/21  Patient Active Problem List   Diagnosis Date Noted   Recurrent dislocation of right hip 06/06/2023   Failed total hip arthroplasty (HCC) 06/05/2023   S/P total right hip arthroplasty 02/17/2023   Osteoarthritis of right hip 02/14/2023   Hypokalemia 09/05/2022   Chronic ischemic heart disease 02/26/2022   Anemia 02/05/2022   Constipation 10/29/2021   Helicobacter pylori gastritis 10/29/2021   Status post total left knee replacement 01/25/2019   Pain management contract agreement 02/22/2016   Opioid type dependence, continuous (HCC) 02/22/2016   BMI 30.0-30.9,adult 01/02/2015   Osteoarthritis of left hip 08/23/2013   Depression  08/23/2013   Osteoporosis 06/23/2013   Glaucoma of right eye 06/23/2013   CAD (coronary artery disease) 08/19/2012   History of MI (myocardial infarction) 08/19/2012   Arthritis of both knees 08/19/2012   Essential hypertension, benign 08/10/2012   Hyperlipidemia with target LDL less than 100 08/10/2012   GERD (gastroesophageal reflux disease) 08/10/2012       Review of Systems     Objective:   Physical Exam Constitutional:      Appearance: Normal appearance.   Cardiovascular:     Rate and Rhythm: Normal rate and regular rhythm.     Heart sounds: Normal heart sounds.  Pulmonary:     Effort: Pulmonary effort is normal.   Musculoskeletal:     Comments: Limping on right hip- walking with cane FROM of cervical spine with pain on rotation    Skin:    General: Skin is warm.   Neurological:     General: No focal deficit present.     Mental Status: She is alert and oriented to person, place, and time.   Psychiatric:        Mood and Affect: Mood normal.    BP (!) 114/57   Pulse (!) 59   Temp 97.9 F (36.6 C) (Temporal)   Ht 5' 5 (1.651 m)   Wt 132 lb (59.9 kg)   SpO2 96%   BMI 21.97 kg/m         Assessment & Plan:  Teresa Holloway in today with  chief complaint of Pain Management   1. Primary osteoarthritis of left hip Rest Has appointment with ortho to discuss hip replacemnet - traMADol  (ULTRAM ) 50 MG tablet; Take 1 tablet (50 mg total) by mouth 2 (two) times daily.  Dispense: 60 tablet; Refill: 2 - ToxASSURE Select 13 (MW), Urine  2. Cervicalgia Ice Keep follow up appointment with ortho   The above assessment and management plan was discussed with the patient. The patient verbalized understanding of and has agreed to the management plan. Patient is aware to call the clinic if symptoms persist or worsen. Patient is aware when to return to the clinic for a follow-up visit. Patient educated on when it is appropriate to go to the emergency department.    Mary-Margaret Gaylyn Keas, FNP

## 2023-10-08 DIAGNOSIS — Z85828 Personal history of other malignant neoplasm of skin: Secondary | ICD-10-CM | POA: Diagnosis not present

## 2023-10-08 DIAGNOSIS — Z08 Encounter for follow-up examination after completed treatment for malignant neoplasm: Secondary | ICD-10-CM | POA: Diagnosis not present

## 2023-10-08 DIAGNOSIS — X32XXXD Exposure to sunlight, subsequent encounter: Secondary | ICD-10-CM | POA: Diagnosis not present

## 2023-10-08 DIAGNOSIS — L820 Inflamed seborrheic keratosis: Secondary | ICD-10-CM | POA: Diagnosis not present

## 2023-10-08 DIAGNOSIS — L570 Actinic keratosis: Secondary | ICD-10-CM | POA: Diagnosis not present

## 2023-10-09 DIAGNOSIS — M542 Cervicalgia: Secondary | ICD-10-CM | POA: Diagnosis not present

## 2023-10-13 ENCOUNTER — Ambulatory Visit: Admitting: Nurse Practitioner

## 2023-10-16 DIAGNOSIS — M542 Cervicalgia: Secondary | ICD-10-CM | POA: Diagnosis not present

## 2023-10-28 DIAGNOSIS — M542 Cervicalgia: Secondary | ICD-10-CM | POA: Diagnosis not present

## 2023-11-10 DIAGNOSIS — M47812 Spondylosis without myelopathy or radiculopathy, cervical region: Secondary | ICD-10-CM | POA: Diagnosis not present

## 2023-11-19 DIAGNOSIS — M47812 Spondylosis without myelopathy or radiculopathy, cervical region: Secondary | ICD-10-CM | POA: Diagnosis not present

## 2023-11-20 ENCOUNTER — Telehealth: Payer: Self-pay | Admitting: Licensed Clinical Social Worker

## 2023-11-20 NOTE — Telephone Encounter (Signed)
 H&V Care Navigation CSW Progress Note  Clinical Social Worker contacted patient by phone to f/u on missed call. No noted [facility] appts. Pt confused, not sure why/if she had called me. Shared she had an appt yesterday and had questions since she doesn't feel completely well. Could not find appt in system. She clarified it was for an orthopedic visit with Dr. Minette office at [facility]. Pt provided their number, I also called over to [facility] and spoke with Britney who will take message and await call from pt.    SDOH Screenings   Food Insecurity: No Food Insecurity (08/26/2023)  Housing: Low Risk  (08/26/2023)  Transportation Needs: No Transportation Needs (08/26/2023)  Utilities: Patient Unable To Answer (08/26/2023)  Alcohol Screen: Low Risk  (08/26/2023)  Depression (PHQ2-9): Low Risk  (10/07/2023)  Financial Resource Strain: Low Risk  (08/26/2023)  Physical Activity: Insufficiently Active (08/26/2023)  Social Connections: Socially Isolated (08/26/2023)  Stress: No Stress Concern Present (08/26/2023)  Tobacco Use: Low Risk  (10/07/2023)  Health Literacy: Adequate Health Literacy (08/26/2023)    Marit Lark, MSW, LCSW Clinical Social Worker [facility] Heart/Vascular Care Navigation  [phone]- work cell phone (preferred)

## 2023-12-05 ENCOUNTER — Other Ambulatory Visit: Payer: Self-pay | Admitting: Nurse Practitioner

## 2023-12-05 DIAGNOSIS — E876 Hypokalemia: Secondary | ICD-10-CM

## 2023-12-11 DIAGNOSIS — M25551 Pain in right hip: Secondary | ICD-10-CM | POA: Diagnosis not present

## 2023-12-25 ENCOUNTER — Other Ambulatory Visit: Payer: Self-pay | Admitting: Nurse Practitioner

## 2023-12-25 DIAGNOSIS — D649 Anemia, unspecified: Secondary | ICD-10-CM

## 2023-12-31 ENCOUNTER — Other Ambulatory Visit: Payer: Self-pay | Admitting: Gastroenterology

## 2024-01-06 ENCOUNTER — Encounter: Payer: Self-pay | Admitting: Nurse Practitioner

## 2024-01-06 ENCOUNTER — Ambulatory Visit: Admitting: Nurse Practitioner

## 2024-01-06 VITALS — BP 154/75 | HR 64 | Temp 97.3°F | Ht 65.0 in | Wt 131.0 lb

## 2024-01-06 DIAGNOSIS — K219 Gastro-esophageal reflux disease without esophagitis: Secondary | ICD-10-CM | POA: Diagnosis not present

## 2024-01-06 DIAGNOSIS — K2970 Gastritis, unspecified, without bleeding: Secondary | ICD-10-CM

## 2024-01-06 DIAGNOSIS — M810 Age-related osteoporosis without current pathological fracture: Secondary | ICD-10-CM

## 2024-01-06 DIAGNOSIS — E785 Hyperlipidemia, unspecified: Secondary | ICD-10-CM

## 2024-01-06 DIAGNOSIS — G8929 Other chronic pain: Secondary | ICD-10-CM

## 2024-01-06 DIAGNOSIS — I25111 Atherosclerotic heart disease of native coronary artery with angina pectoris with documented spasm: Secondary | ICD-10-CM

## 2024-01-06 DIAGNOSIS — F3342 Major depressive disorder, recurrent, in full remission: Secondary | ICD-10-CM

## 2024-01-06 DIAGNOSIS — I10 Essential (primary) hypertension: Secondary | ICD-10-CM

## 2024-01-06 DIAGNOSIS — M25551 Pain in right hip: Secondary | ICD-10-CM | POA: Diagnosis not present

## 2024-01-06 DIAGNOSIS — B9681 Helicobacter pylori [H. pylori] as the cause of diseases classified elsewhere: Secondary | ICD-10-CM

## 2024-01-06 DIAGNOSIS — D649 Anemia, unspecified: Secondary | ICD-10-CM

## 2024-01-06 DIAGNOSIS — E876 Hypokalemia: Secondary | ICD-10-CM

## 2024-01-06 DIAGNOSIS — Z6830 Body mass index (BMI) 30.0-30.9, adult: Secondary | ICD-10-CM

## 2024-01-06 LAB — LIPID PANEL

## 2024-01-06 MED ORDER — ATORVASTATIN CALCIUM 40 MG PO TABS
ORAL_TABLET | ORAL | 1 refills | Status: AC
Start: 2024-01-06 — End: ?

## 2024-01-06 MED ORDER — TRAMADOL HCL 50 MG PO TABS: 100.0000 mg | ORAL_TABLET | Freq: Two times a day (BID) | ORAL | 2 refills | Status: DC

## 2024-01-06 MED ORDER — FERROUS SULFATE 325 (65 FE) MG PO TABS: 325.0000 mg | ORAL_TABLET | Freq: Every day | ORAL | 1 refills | Status: AC

## 2024-01-06 MED ORDER — POTASSIUM CHLORIDE CRYS ER 10 MEQ PO TBCR: 10.0000 meq | EXTENDED_RELEASE_TABLET | Freq: Two times a day (BID) | ORAL | 1 refills | Status: AC

## 2024-01-06 MED ORDER — ISOSORBIDE MONONITRATE ER 60 MG PO TB24: ORAL_TABLET | ORAL | 1 refills | Status: AC

## 2024-01-06 MED ORDER — BENAZEPRIL HCL 40 MG PO TABS: 40.0000 mg | ORAL_TABLET | Freq: Every day | ORAL | 1 refills | Status: AC

## 2024-01-06 MED ORDER — HYDROCHLOROTHIAZIDE 25 MG PO TABS: 25.0000 mg | ORAL_TABLET | Freq: Every day | ORAL | 1 refills | Status: AC

## 2024-01-06 MED ORDER — ESCITALOPRAM OXALATE 20 MG PO TABS: 20.0000 mg | ORAL_TABLET | Freq: Every day | ORAL | 1 refills | Status: AC

## 2024-01-06 MED ORDER — AMLODIPINE BESYLATE 5 MG PO TABS: 5.0000 mg | ORAL_TABLET | Freq: Every day | ORAL | 1 refills | Status: AC

## 2024-01-06 MED ORDER — FAMOTIDINE 20 MG PO TABS: 20.0000 mg | ORAL_TABLET | Freq: Two times a day (BID) | ORAL | 1 refills | Status: DC

## 2024-01-06 NOTE — Patient Instructions (Signed)
 Fall Prevention in the Home, Adult Falls can cause injuries and can happen to people of all ages. There are many things you can do to make your home safer and to help prevent falls. What actions can I take to prevent falls? General information Use good lighting in all rooms. Make sure to: Replace any light bulbs that burn out. Turn on the lights in dark areas and use night-lights. Keep items that you use often in easy-to-reach places. Lower the shelves around your home if needed. Move furniture so that there are clear paths around it. Do not use throw rugs or other things on the floor that can make you trip. If any of your floors are uneven, fix them. Add color or contrast paint or tape to clearly mark and help you see: Grab bars or handrails. First and last steps of staircases. Where the edge of each step is. If you use a ladder or stepladder: Make sure that it is fully opened. Do not climb a closed ladder. Make sure the sides of the ladder are locked in place. Have someone hold the ladder while you use it. Know where your pets are as you move through your home. What can I do in the bathroom?     Keep the floor dry. Clean up any water on the floor right away. Remove soap buildup in the bathtub or shower. Buildup makes bathtubs and showers slippery. Use non-skid mats or decals on the floor of the bathtub or shower. Attach bath mats securely with double-sided, non-slip rug tape. If you need to sit down in the shower, use a non-slip stool. Install grab bars by the toilet and in the bathtub and shower. Do not use towel bars as grab bars. What can I do in the bedroom? Make sure that you have a light by your bed that is easy to reach. Do not use any sheets or blankets on your bed that hang to the floor. Have a firm chair or bench with side arms that you can use for support when you get dressed. What can I do in the kitchen? Clean up any spills right away. If you need to reach something  above you, use a step stool with a grab bar. Keep electrical cords out of the way. Do not use floor polish or wax that makes floors slippery. What can I do with my stairs? Do not leave anything on the stairs. Make sure that you have a light switch at the top and the bottom of the stairs. Make sure that there are handrails on both sides of the stairs. Fix handrails that are broken or loose. Install non-slip stair treads on all your stairs if they do not have carpet. Avoid having throw rugs at the top or bottom of the stairs. Choose a carpet that does not hide the edge of the steps on the stairs. Make sure that the carpet is firmly attached to the stairs. Fix carpet that is loose or worn. What can I do on the outside of my home? Use bright outdoor lighting. Fix the edges of walkways and driveways and fix any cracks. Clear paths of anything that can make you trip, such as tools or rocks. Add color or contrast paint or tape to clearly mark and help you see anything that might make you trip as you walk through a door, such as a raised step or threshold. Trim any bushes or trees on paths to your home. Check to see if handrails are loose  or broken and that both sides of all steps have handrails. Install guardrails along the edges of any raised decks and porches. Have leaves, snow, or ice cleared regularly. Use sand, salt, or ice melter on paths if you live where there is ice and snow during the winter. Clean up any spills in your garage right away. This includes grease or oil spills. What other actions can I take? Review your medicines with your doctor. Some medicines can cause dizziness or changes in blood pressure, which increase your risk of falling. Wear shoes that: Have a low heel. Do not wear high heels. Have rubber bottoms and are closed at the toe. Feel good on your feet and fit well. Use tools that help you move around if needed. These include: Canes. Walkers. Scooters. Crutches. Ask  your doctor what else you can do to help prevent falls. This may include seeing a physical therapist to learn to do exercises to move better and get stronger. Where to find more information Centers for Disease Control and Prevention, [employer]: TonerPromos.no [employer] on Aging: BaseRingTones.pl National Institute on Aging: BaseRingTones.pl Contact a doctor if: You are afraid of falling at home. You feel weak, drowsy, or dizzy at home. You fall at home. Get help right away if you: Lose consciousness or have trouble moving after a fall. Have a fall that causes a head injury. These symptoms may be an emergency. Get help right away. Call 911. Do not wait to see if the symptoms will go away. Do not drive yourself to the hospital. This information is not intended to replace advice given to you by your health care provider. Make sure you discuss any questions you have with your health care provider. Document Revised: 12/10/2021 Document Reviewed: 12/10/2021 Elsevier Patient Education  2024 [employer].

## 2024-01-06 NOTE — Progress Notes (Signed)
 Subjective:    Patient ID: Teresa Holloway, female    DOB: 09/24/1940, 83 y.o.   MRN: [id number]   Chief Complaint: medical management of chronic issues      HPI:  Teresa Holloway is a 83 y.o. who identifies as a female who was assigned female at birth.   Social history: Lives with: by herself-her husband passed away in the last year Work history: retired   Water engineer in today for follow up of the following chronic medical issues:  1. Essential hypertension, benign No c/o chest pain, sob or headache. Does check blood pressure at home, 110-130 systolic  most days. BP Readings from Last 3 Encounters:  10/07/23 (!) 114/57  08/28/23 129/74  08/26/23 110/66     2. Hyperlipidemia with target LDL less than 100 Does not watch diet and does no exercise due to chronic hip pain. Lab Results  Component Value Date   CHOL 125 07/10/2023   HDL 72 07/10/2023   LDLCALC 39 07/10/2023   TRIG 66 07/10/2023   CHOLHDL 1.7 07/10/2023     3. Coronary artery disease involving native coronary artery of native heart with angina pectoris with documented spasm [facility]) Last saw cardiology 02/26/22. Review of office note - no change in plan of care. Has follow up appt this november  4. Gastroesophageal reflux disease, unspecified whether esophagitis present Uses OTC meds when needed  5. Helicobacter pylori gastritis Had some gastritis last week and had stool hpylori test which came back negative. She denies any constipation or diarrhea.  6. Anemia, unspecified type No fatigue.  Lab Results  Component Value Date   HGB 10.1 (L) 07/10/2023     7. Constipation, unspecified constipation type Doing well right now  8. Recurrent major depressive disorder, in full remission (HCC) Is on lexapro  and is doing well    10/07/2023   10:59 AM 03/17/2023    2:27 PM 02/11/2023   10:42 AM 01/20/2023   10:23 AM  GAD 7 : Generalized Anxiety Score  Nervous, Anxious, on Edge 1 1 2 1   Control/stop  worrying 0 0 2 1  Worry too much - different things 1 0 2 1  Trouble relaxing 0 1 0 1  Restless 0 0 0 0  Easily annoyed or irritable 1 1 2 1   Afraid - awful might happen 1 0 0 0  Total GAD 7 Score 4 3 8 5   Anxiety Difficulty Not difficult at all Somewhat difficult Somewhat difficult Not difficult at all      9. Age related osteoporosis, unspecified pathological fracture presence// Last dexascan was done on 12/04/21. T score was -2.5. she doe sno weight bearing exercises due to her chronic hip pain.  10. Chronic hip pain Pain assessment: Cause of pain- hip replacement- but is also having neck pain Pain location- right hip Pain on scale of 1-10- 5/10 today Frequency- daily What increases pain-walking to much What makes pain Better-pain meds help Effects on ADL - none Any change in general medical condition-9one  Current opioids rx- ultram  # meds rx- 60 Effectiveness of current meds-helps Adverse reactions from pain meds-none Morphine equivalent- 10MME  Pill count performed-No Last drug screen - 12/16/22 ( high risk q36m, moderate risk q66m, low risk yearly ) Urine drug screen today- No Was the NCCSR reviewed- yes  If yes were their any concerning findings? - no   Overdose risk: 1    06/06/2022   10:33 AM  Opioid Risk   Alcohol 0  Illegal Drugs 0  Rx Drugs 0  Alcohol 0  Illegal Drugs 0  Rx Drugs 0  Age between 16-45 years  0  History of Preadolescent Sexual Abuse 0  Psychological Disease 0  Depression 0  Opioid Risk Tool Scoring 0  Opioid Risk Interpretation Low Risk     Pain contract signed on: 01/06/24    11. BMI 30.0-30.9,adult No recent weight changes  Wt Readings from Last 3 Encounters:  01/06/24 131 lb (59.4 kg)  10/07/23 132 lb (59.9 kg)  08/28/23 135 lb (61.2 kg)   BMI Readings from Last 3 Encounters:  01/06/24 21.80 kg/m  10/07/23 21.97 kg/m  08/28/23 22.47 kg/m      New complaints: WHile taking PT for hip replacement she was told  that she has arthritis in her neck. Seeing [facility] and has been getting injections. SOme better. Ultram  helps  Allergies  Allergen Reactions   Atarax [Hydroxyzine] Nausea And Vomiting   Bactrim  [Sulfamethoxazole -Trimethoprim ] Nausea Only   Coreg [Carvedilol] Other (See Comments)    Unknown   Outpatient Encounter Medications as of 01/06/2024  Medication Sig   amLODipine  (NORVASC ) 5 MG tablet Take 1 tablet (5 mg total) by mouth daily.   aspirin  EC 81 MG tablet Take 1 tablet (81 mg total) by mouth 2 (two) times daily.   atorvastatin  (LIPITOR) 40 MG tablet TAKE 1 TABLET BY MOUTH DAILY AT 6 PM.   benazepril  (LOTENSIN ) 40 MG tablet Take 1 tablet (40 mg total) by mouth daily.   cholecalciferol  (VITAMIN D ) 1000 UNITS tablet Take 1,000 Units by mouth daily.   cyanocobalamin  (VITAMIN B12) 1000 MCG tablet Take 1,000 mcg by mouth daily.   escitalopram  (LEXAPRO ) 20 MG tablet TAKE 1 TABLET BY MOUTH EVERY DAY   famotidine  (PEPCID ) 20 MG tablet Take 1 tablet (20 mg total) by mouth 2 (two) times daily.   ferrous sulfate  325 (65 FE) MG tablet TAKE 1 TABLET BY MOUTH EVERY DAY WITH BREAKFAST   hydrochlorothiazide  (HYDRODIURIL ) 25 MG tablet Take 1 tablet (25 mg total) by mouth daily.   isosorbide  mononitrate (IMDUR ) 60 MG 24 hr tablet TAKE 1 TABLET BY MOUTH EVERYDAY AT BEDTIME   lubiprostone  (AMITIZA ) 24 MCG capsule TAKE 1 CAPSULE (24 MCG TOTAL) BY MOUTH 2 (TWO) TIMES DAILY WITH A MEAL.   nitroGLYCERIN  (NITROSTAT ) 0.4 MG SL tablet Place 1 tablet (0.4 mg total) under the tongue every 5 (five) minutes as needed for chest pain.   potassium chloride  (KLOR-CON  M) 10 MEQ tablet TAKE 1 TABLET BY MOUTH 2 TIMES DAILY.   Probiotic CHEW Chew 1 capsule by mouth daily.   traMADol  (ULTRAM ) 50 MG tablet Take 2 tablets (100 mg total) by mouth 2 (two) times daily. Takes 2 daily   No facility-administered encounter medications on file as of 01/06/2024.    Past Surgical History:  Procedure Laterality Date   BIOPSY   07/16/2021   Procedure: BIOPSY;  Surgeon: Cindie Carlin POUR, DO;  Location: [facility];  Service: Endoscopy;;   COLONOSCOPY WITH PROPOFOL  N/A 07/16/2021   Procedure: COLONOSCOPY WITH PROPOFOL ;  Surgeon: Cindie Carlin POUR, DO;  Location: [facility];  Service: Endoscopy;  Laterality: N/A;  10:30am   ECTOPIC PREGNANCY SURGERY     ESOPHAGOGASTRODUODENOSCOPY (EGD) WITH PROPOFOL  N/A 07/16/2021   Procedure: ESOPHAGOGASTRODUODENOSCOPY (EGD) WITH PROPOFOL ;  Surgeon: Cindie Carlin POUR, DO;  Location: [facility];  Service: Endoscopy;  Laterality: N/A;   left breast biospy     left wrist fracture  TOTAL HIP ARTHROPLASTY Right 02/17/2023   Procedure: RIGHT TOTAL HIP ARTHROPLASTY ANTERIOR APPROACH;  Surgeon: Liam Lerner, MD;  Location: [facility];  Service: Orthopedics;  Laterality: Right;   TOTAL HIP ARTHROPLASTY Right 06/06/2023   Procedure: TOTAL HIP ARTHROPLASTY ANTERIOR APPROACH;  Surgeon: Liam Lerner, MD;  Location: [facility];  Service: Orthopedics;  Laterality: Right;   TOTAL KNEE ARTHROPLASTY Left 01/25/2019   Procedure: LEFT TOTAL KNEE ARTHROPLASTY;  Surgeon: Liam Lerner, MD;  Location: [facility];  Service: Orthopedics;  Laterality: Left;    Family History  Problem Relation Age of Onset   Asthma Father    Diabetes Sister    Diabetes Brother       Controlled substance contract: n/a     Review of Systems  Constitutional:  Negative for diaphoresis.  Eyes:  Negative for pain.  Respiratory:  Negative for shortness of breath.   Cardiovascular:  Negative for chest pain, palpitations and leg swelling.  Gastrointestinal:  Negative for abdominal pain.  Endocrine: Negative for polydipsia.  Skin:  Negative for rash.  Neurological:  Negative for dizziness, weakness and headaches.  Hematological:  Does not bruise/bleed easily.  All other systems reviewed and are negative.      Objective:   Physical Exam Vitals and nursing note reviewed.  Constitutional:      General: She is not in acute  distress.    Appearance: Normal appearance. She is well-developed.  HENT:     Head: Normocephalic.     Right Ear: Tympanic membrane normal.     Left Ear: Tympanic membrane normal.     Nose: Nose normal.     Mouth/Throat:     Mouth: Mucous membranes are moist.  Eyes:     Pupils: Pupils are equal, round, and reactive to light.  Neck:     Vascular: No carotid bruit or JVD.  Cardiovascular:     Rate and Rhythm: Normal rate and regular rhythm.     Heart sounds: Normal heart sounds.  Pulmonary:     Effort: Pulmonary effort is normal. No respiratory distress.     Breath sounds: Normal breath sounds. No wheezing or rales.  Chest:     Chest wall: No tenderness.  Abdominal:     General: Bowel sounds are normal. There is no distension or abdominal bruit.     Palpations: Abdomen is soft. There is no hepatomegaly, splenomegaly, mass or pulsatile mass.     Tenderness: There is no abdominal tenderness.  Musculoskeletal:        General: Normal range of motion.     Cervical back: Normal range of motion and neck supple.     Comments: Walking with cane Gait slow and steady   Lymphadenopathy:     Cervical: No cervical adenopathy.  Skin:    General: Skin is warm and dry.  Neurological:     Mental Status: She is alert and oriented to person, place, and time.     Deep Tendon Reflexes: Reflexes are normal and symmetric.  Psychiatric:        Behavior: Behavior normal.        Thought Content: Thought content normal.        Judgment: Judgment normal.     BP (!) 154/75   Pulse 64   Temp (!) 97.3 F (36.3 C) (Temporal)   Ht 5' 5 (1.651 m)   Wt 131 lb (59.4 kg)   SpO2 94%   BMI 21.80 kg/m         Assessment &  Plan:  Teresa Holloway comes in today with chief complaint of Medical Management of Chronic Issues   Diagnosis and orders addressed:  1. Essential hypertension, benign (Primary) Low salt diet  Keep diary of blood pressure at  h ome - amLODipine  (NORVASC ) 5 MG tablet;  Take 1 tablet (5 mg total) by mouth daily.  Dispense: 90 tablet; Refill: 1 - benazepril  (LOTENSIN ) 40 MG tablet; Take 1 tablet (40 mg total) by mouth daily.  Dispense: 90 tablet; Refill: 1 - hydrochlorothiazide  (HYDRODIURIL ) 25 MG tablet; Take 1 tablet (25 mg total) by mouth daily.  Dispense: 90 tablet; Refill: 1  2. Hyperlipidemia with target LDL less than 100 Low fat diet - atorvastatin  (LIPITOR) 40 MG tablet; TAKE 1 TABLET BY MOUTH DAILY AT 6 PM.  Dispense: 90 tablet; Refill: 1  3. Coronary artery disease involving native coronary artery of native heart with angina pectoris with documented spasm (HCC) Keep appt with cardiology - isosorbide  mononitrate (IMDUR ) 60 MG 24 hr tablet; TAKE 1 TABLET BY MOUTH EVERYDAY AT BEDTIME  Dispense: 90 tablet; Refill: 1  4. Gastroesophageal reflux disease, unspecified whether esophagitis present Avoid spicy foods Do not eat 2 hours prior to bedtime - famotidine  (PEPCID ) 20 MG tablet; Take 1 tablet (20 mg total) by mouth 2 (two) times daily.  Dispense: 90 tablet; Refill: 1  5. Helicobacter pylori gastritis management  6. Recurrent major depressive disorder, in full remission (HCC)  - hydrochlorothiazide  (HYDRODIURIL ) 25 MG tablet; Take 1 tablet (25 mg total) by mouth daily.  Dispense: 90 tablet; Refill: 1 - escitalopram  (LEXAPRO ) 20 MG tablet; Take 1 tablet (20 mg total) by mouth daily.  Dispense: 90 tablet; Refill: 1  7. Anemia, unspecified type Labs pending - ferrous sulfate  325 (65 FE) MG tablet; Take 1 tablet (325 mg total) by mouth daily with breakfast.  Dispense: 90 tablet; Refill: 1  8. Age related osteoporosis, unspecified pathological fracture presence Weight bearing exercises  9. BMI 30.0-30.9,adult Discussed diet and exercise for person with BMI >25 Will recheck weight in 3-6 months  10. Chronic pain of right hip Follow up in 3 months for med refill - ToxASSURE Select 13 (MW), Urine - traMADol  (ULTRAM ) 50 MG tablet; Take 2 tablets  (100 mg total) by mouth 2 (two) times daily. Takes 2 daily  Dispense: 60 tablet; Refill: 2  11. Hypokalemia Labs pending - potassium chloride  (KLOR-CON  M) 10 MEQ tablet; Take 1 tablet (10 mEq total) by mouth 2 (two) times daily.  Dispense: 180 tablet; Refill: 1   Labs pending Health Maintenance reviewed Diet and exercise encouraged  Follow up plan: 3 months   Mary-Margaret Gladis, FNP

## 2024-01-07 ENCOUNTER — Ambulatory Visit: Payer: Self-pay | Admitting: Nurse Practitioner

## 2024-01-07 LAB — CMP14+EGFR
ALT: 7 IU/L (ref 0–32)
AST: 9 IU/L (ref 0–40)
Albumin: 4.2 g/dL (ref 3.7–4.7)
Alkaline Phosphatase: 94 IU/L (ref 48–129)
BUN/Creatinine Ratio: 18 (ref 12–28)
BUN: 17 mg/dL (ref 8–27)
Bilirubin Total: 0.7 mg/dL (ref 0.0–1.2)
CO2: 27 mmol/L (ref 20–29)
Calcium: 9.6 mg/dL (ref 8.7–10.3)
Chloride: 103 mmol/L (ref 96–106)
Creatinine, Ser: 0.97 mg/dL (ref 0.57–1.00)
Globulin, Total: 2 g/dL (ref 1.5–4.5)
Glucose: 88 mg/dL (ref 70–99)
Potassium: 3.9 mmol/L (ref 3.5–5.2)
Sodium: 143 mmol/L (ref 134–144)
Total Protein: 6.2 g/dL (ref 6.0–8.5)
eGFR: 58 mL/min/1.73 — AB (ref >59)

## 2024-01-07 LAB — CBC WITH DIFFERENTIAL/PLATELET
Basophils Absolute: 0.1 x10E3/uL (ref 0.0–0.2)
Basos: 2 %
EOS (ABSOLUTE): 0.1 x10E3/uL (ref 0.0–0.4)
Eos: 3 %
Hematocrit: 35.5 % (ref 34.0–46.6)
Hemoglobin: 11.2 g/dL (ref 11.1–15.9)
Immature Grans (Abs): 0 x10E3/uL (ref 0.0–0.1)
Immature Granulocytes: 0 %
Lymphocytes Absolute: 1.3 x10E3/uL (ref 0.7–3.1)
Lymphs: 30 %
MCH: 30.9 pg (ref 26.6–33.0)
MCHC: 31.5 g/dL (ref 31.5–35.7)
MCV: 98 fL — ABNORMAL HIGH (ref 79–97)
Monocytes Absolute: 0.4 x10E3/uL (ref 0.1–0.9)
Monocytes: 10 %
Neutrophils Absolute: 2.5 x10E3/uL (ref 1.4–7.0)
Neutrophils: 55 %
Platelets: 184 x10E3/uL (ref 150–450)
RBC: 3.63 x10E6/uL — ABNORMAL LOW (ref 3.77–5.28)
RDW: 12.3 % (ref 11.7–15.4)
WBC: 4.5 x10E3/uL (ref 3.4–10.8)

## 2024-01-07 LAB — LIPID PANEL
Cholesterol, Total: 128 mg/dL (ref 100–199)
HDL: 71 mg/dL (ref >39)
LDL CALC COMMENT:: 1.8 ratio (ref 0.0–4.4)
LDL Chol Calc (NIH): 43 mg/dL (ref 0–99)
Triglycerides: 70 mg/dL (ref 0–149)
VLDL Cholesterol Cal: 14 mg/dL (ref 5–40)

## 2024-01-08 LAB — TOXASSURE SELECT 13 (MW), URINE

## 2024-01-12 DIAGNOSIS — M47812 Spondylosis without myelopathy or radiculopathy, cervical region: Secondary | ICD-10-CM | POA: Diagnosis not present

## 2024-01-15 DIAGNOSIS — M47812 Spondylosis without myelopathy or radiculopathy, cervical region: Secondary | ICD-10-CM | POA: Diagnosis not present

## 2024-02-25 ENCOUNTER — Ambulatory Visit: Payer: Self-pay

## 2024-02-25 NOTE — Telephone Encounter (Signed)
 FYI Only or Action Required?: FYI only for provider: appointment scheduled on 11/7.  Patient was last seen in primary care on 01/06/2024 by Gladis Mustard, FNP.  Called Nurse Triage reporting Neck Pain.  Symptoms began several months ago.  Interventions attempted: Prescription medications: Tramadol .  Symptoms are: gradually worsening.  Triage Disposition: See PCP When Office is Open (Within 3 Days)  Patient/caregiver understands and will follow disposition?: Yes  Copied from CRM #[id number]. Topic: Clinical - Red Word Triage >> Feb 25, 2024  4:10 PM Victoria B wrote: [facility] that prompted transfer to Nurse Triage: patient states back is swollen and has severe neck pain, states she was told he has bursitis Reason for Disposition  [1] MODERATE neck pain (e.g., interferes with normal activities) AND [2] present > 3 days  Answer Assessment - Initial Assessment Questions Ongoing neck pain. Saw Ortho yesterday and advised she may be able to get spine injections but that's about it for treatment. She wants to get better, not just manage symptoms.   Cracks and pops when she moves. Bone sticking out at the top of her spine Imaging said it was from exercising but the first thing they did was make her do exercises and therapy.  Appt made with PCP to discuss next steps. ED/UC precautions advised and understood.   1. ONSET: When did the pain begin?      A couple months 2. LOCATION: Where does it hurt?      Neck  3. PATTERN Does the pain come and go, or has it been constant since it started?      Constant- tramadol  helps some  4. SEVERITY: How bad is the pain?  (Scale 0-10; or none or slight stiffness, mild, moderate, severe)     At its worst 10/10, tramadol  take the edge off and lowers pain to 7/10 5. RADIATION: Does the pain go anywhere else, shoot into your arms?     shoulders 6. CORD SYMPTOMS: Any weakness or numbness of the arms or legs?     denies 7. CAUSE: What do  you think is causing the neck pain?     bursitis 8. NECK OVERUSE: Any recent activities that involved turning or twisting the neck?     Exercise made it worse  9. OTHER SYMPTOMS: Do you have any other symptoms? (e.g., headache, fever, chest pain, difficulty breathing, neck swelling)     Denies  Protocols used: Neck Pain or Stiffness-A-AH

## 2024-02-25 NOTE — Telephone Encounter (Signed)
 Has appt with PCP on 11/7

## 2024-02-25 NOTE — Telephone Encounter (Signed)
 I called and spoke with patients daughter about this per signed DPR, and daughter states that patient has been having neck pain for the past 6 months and sees a specialist (Dr Cyrena) for this and has an upcoming appt with them on 11/25. Daughter cancelled patients appt with MMM on 11/7 and said she was going to call patient and talk to her about this.

## 2024-02-26 ENCOUNTER — Ambulatory Visit: Admitting: Nurse Practitioner

## 2024-02-27 ENCOUNTER — Ambulatory Visit: Admitting: Nurse Practitioner

## 2024-04-04 ENCOUNTER — Other Ambulatory Visit: Payer: Self-pay | Admitting: Nurse Practitioner

## 2024-04-04 DIAGNOSIS — K219 Gastro-esophageal reflux disease without esophagitis: Secondary | ICD-10-CM

## 2024-04-06 ENCOUNTER — Encounter: Payer: Self-pay | Admitting: Nurse Practitioner

## 2024-04-06 ENCOUNTER — Ambulatory Visit: Payer: Self-pay | Admitting: Nurse Practitioner

## 2024-04-06 VITALS — BP 117/71 | HR 87 | Temp 97.7°F | Ht 65.0 in | Wt 126.0 lb

## 2024-04-06 DIAGNOSIS — G8929 Other chronic pain: Secondary | ICD-10-CM

## 2024-04-06 DIAGNOSIS — F1120 Opioid dependence, uncomplicated: Secondary | ICD-10-CM

## 2024-04-06 MED ORDER — TRAMADOL HCL 50 MG PO TABS: 100.0000 mg | ORAL_TABLET | Freq: Two times a day (BID) | ORAL | 2 refills | Status: AC

## 2024-04-06 NOTE — Progress Notes (Signed)
 Subjective:    Patient ID: Teresa Holloway, female    DOB: [birth year], 83 y.o.   MRN: [id number]   Chief Complaint: pain management  HPI Pateint feel out of bed several days ago and now she is sore.says her tail bone is the sorest. Patient has severe osteoarthritis of hip and needs total hip replacement but her family does not want her to have surgery. Pain assessment: Cause of pain- OA of hip, and now chronic neck pain Pain location- right hip Pain on scale of 1-10- 7-8/10 Frequency- daily What increases pain-nothing What makes pain Better- rest helps Effects on ADL - none Any change in general medical condition-none  Current opioids rx- ultram  # meds rx- 60 Effectiveness of current meds-helps Adverse reactions from pain meds-none Morphine equivalent-  Pill count performed-No Last drug screen - never ( high risk q69m, moderate risk q33m, low risk yearly ) Urine drug screen today- Yes Was the [facility] reviewed- yes  If yes were their any concerning findings? - no   Overdose risk: 1    06/06/2022   10:33 AM  Opioid Risk   Alcohol 0  Illegal Drugs 0  Rx Drugs 0  Alcohol 0  Illegal Drugs 0  Rx Drugs 0  Age between 16-45 years  0  History of Preadolescent Sexual Abuse 0  Psychological Disease 0  Depression 0  Opioid Risk Tool Scoring 0  Opioid Risk Interpretation Low Risk     Pain contract signed on:01/06/24  Patient Active Problem List   Diagnosis Date Noted   Recurrent dislocation of right hip 06/06/2023   Failed total hip arthroplasty 06/05/2023   S/P total right hip arthroplasty 02/17/2023   Osteoarthritis of right hip 02/14/2023   Hypokalemia 09/05/2022   Chronic ischemic heart disease 02/26/2022   Anemia 02/05/2022   Constipation 10/29/2021   Helicobacter pylori gastritis 10/29/2021   Status post total left knee replacement 01/25/2019   Pain management contract agreement 02/22/2016   Opioid type dependence, continuous (HCC) 02/22/2016   BMI  30.0-30.9,adult 01/02/2015   Osteoarthritis of left hip 08/23/2013   Depression 08/23/2013   Osteoporosis 06/23/2013   Glaucoma of right eye 06/23/2013   CAD (coronary artery disease) 08/19/2012   History of MI (myocardial infarction) 08/19/2012   Arthritis of both knees 08/19/2012   Essential hypertension, benign 08/10/2012   Hyperlipidemia with target LDL less than 100 08/10/2012   GERD (gastroesophageal reflux disease) 08/10/2012       Review of Systems  Constitutional:  Negative for diaphoresis.  Eyes:  Negative for pain.  Respiratory:  Negative for shortness of breath.   Cardiovascular:  Negative for chest pain, palpitations and leg swelling.  Gastrointestinal:  Negative for abdominal pain.  Endocrine: Negative for polydipsia.  Musculoskeletal:  Positive for back pain.  Skin:  Negative for rash.  Neurological:  Negative for dizziness, weakness and headaches.  Hematological:  Does not bruise/bleed easily.  All other systems reviewed and are negative.      Objective:   Physical Exam Constitutional:      Appearance: Normal appearance.  Cardiovascular:     Rate and Rhythm: Normal rate and regular rhythm.     Heart sounds: Normal heart sounds.  Pulmonary:     Effort: Pulmonary effort is normal.  Musculoskeletal:     Comments: Limping on right hip- walking with cane FROM of cervical spine with pain on rotation  Tail bone pain when sitting  Skin:    General: Skin is warm.  Neurological:  General: No focal deficit present.     Mental Status: She is alert and oriented to person, place, and time.  Psychiatric:        Mood and Affect: Mood normal.    BP 117/71   Pulse 87   Temp 97.7 F (36.5 C) (Temporal)   Ht 5' 5 (1.651 m)   Wt 126 lb (57.2 kg)   SpO2 92%   BMI 20.97 kg/m         Assessment & Plan:  Teresa Holloway in today with chief complaint of Pain Management   1. Opioid dependence in controlled environment (HCC) (Primary)  2. Chronic  pain of right hip Moist heat Rest RTO in 3 months - traMADol  (ULTRAM ) 50 MG tablet; Take 2 tablets (100 mg total) by mouth 2 (two) times daily. Takes 2 daily  Dispense: 60 tablet; Refill: 2    The above assessment and management plan was discussed with the patient. The patient verbalized understanding of and has agreed to the management plan. Patient is aware to call the clinic if symptoms persist or worsen. Patient is aware when to return to the clinic for a follow-up visit. Patient educated on when it is appropriate to go to the emergency department.   Mary-Margaret Gladis, FNP

## 2024-04-06 NOTE — Patient Instructions (Signed)
 Fall Prevention in the Home, Adult Falls can cause injuries and can happen to people of all ages. There are many things you can do to make your home safer and to help prevent falls. What actions can I take to prevent falls? General information Use good lighting in all rooms. Make sure to: Replace any light bulbs that burn out. Turn on the lights in dark areas and use night-lights. Keep items that you use often in easy-to-reach places. Lower the shelves around your home if needed. Move furniture so that there are clear paths around it. Do not use throw rugs or other things on the floor that can make you trip. If any of your floors are uneven, fix them. Add color or contrast paint or tape to clearly mark and help you see: Grab bars or handrails. First and last steps of staircases. Where the edge of each step is. If you use a ladder or stepladder: Make sure that it is fully opened. Do not climb a closed ladder. Make sure the sides of the ladder are locked in place. Have someone hold the ladder while you use it. Know where your pets are as you move through your home. What can I do in the bathroom?     Keep the floor dry. Clean up any water on the floor right away. Remove soap buildup in the bathtub or shower. Buildup makes bathtubs and showers slippery. Use non-skid mats or decals on the floor of the bathtub or shower. Attach bath mats securely with double-sided, non-slip rug tape. If you need to sit down in the shower, use a non-slip stool. Install grab bars by the toilet and in the bathtub and shower. Do not use towel bars as grab bars. What can I do in the bedroom? Make sure that you have a light by your bed that is easy to reach. Do not use any sheets or blankets on your bed that hang to the floor. Have a firm chair or bench with side arms that you can use for support when you get dressed. What can I do in the kitchen? Clean up any spills right away. If you need to reach something  above you, use a step stool with a grab bar. Keep electrical cords out of the way. Do not use floor polish or wax that makes floors slippery. What can I do with my stairs? Do not leave anything on the stairs. Make sure that you have a light switch at the top and the bottom of the stairs. Make sure that there are handrails on both sides of the stairs. Fix handrails that are broken or loose. Install non-slip stair treads on all your stairs if they do not have carpet. Avoid having throw rugs at the top or bottom of the stairs. Choose a carpet that does not hide the edge of the steps on the stairs. Make sure that the carpet is firmly attached to the stairs. Fix carpet that is loose or worn. What can I do on the outside of my home? Use bright outdoor lighting. Fix the edges of walkways and driveways and fix any cracks. Clear paths of anything that can make you trip, such as tools or rocks. Add color or contrast paint or tape to clearly mark and help you see anything that might make you trip as you walk through a door, such as a raised step or threshold. Trim any bushes or trees on paths to your home. Check to see if handrails are loose  or broken and that both sides of all steps have handrails. Install guardrails along the edges of any raised decks and porches. Have leaves, snow, or ice cleared regularly. Use sand, salt, or ice melter on paths if you live where there is ice and snow during the winter. Clean up any spills in your garage right away. This includes grease or oil spills. What other actions can I take? Review your medicines with your doctor. Some medicines can cause dizziness or changes in blood pressure, which increase your risk of falling. Wear shoes that: Have a low heel. Do not wear high heels. Have rubber bottoms and are closed at the toe. Feel good on your feet and fit well. Use tools that help you move around if needed. These include: Canes. Walkers. Scooters. Crutches. Ask  your doctor what else you can do to help prevent falls. This may include seeing a physical therapist to learn to do exercises to move better and get stronger. Where to find more information Centers for Disease Control and Prevention, [employer]: TonerPromos.no [employer] on Aging: BaseRingTones.pl National Institute on Aging: BaseRingTones.pl Contact a doctor if: You are afraid of falling at home. You feel weak, drowsy, or dizzy at home. You fall at home. Get help right away if you: Lose consciousness or have trouble moving after a fall. Have a fall that causes a head injury. These symptoms may be an emergency. Get help right away. Call 911. Do not wait to see if the symptoms will go away. Do not drive yourself to the hospital. This information is not intended to replace advice given to you by your health care provider. Make sure you discuss any questions you have with your health care provider. Document Revised: 12/10/2021 Document Reviewed: 12/10/2021 Elsevier Patient Education  2024 [employer].

## 2024-04-20 ENCOUNTER — Telehealth: Payer: Self-pay | Admitting: Nurse Practitioner

## 2024-04-20 DIAGNOSIS — M533 Sacrococcygeal disorders, not elsewhere classified: Secondary | ICD-10-CM

## 2024-04-20 NOTE — Telephone Encounter (Signed)
 Please make patient an appointment to come and see MMM and have xrays done

## 2024-04-20 NOTE — Telephone Encounter (Signed)
 Was seen by PCP on 12/16. Please advise. Okay to order xray or does patient need another appt?  Copied from CRM [id number]. Topic: Clinical - Request for Lab/Test Order >> Apr 19, 2024  4:37 PM [name] wrote: Reason for CRM: Teresa Holloway calling on behalf of the Patient and would like to get an xray of the patients back and tailbone due to a fall she had on 04/03/2024. Patient was advised at an office visit that it would not be necessary but she is still feeling discomfort and would like to be sure  [name] callback: [phone]

## 2024-04-20 NOTE — Telephone Encounter (Signed)
 Ok to come in for tailbone xray

## 2024-04-21 ENCOUNTER — Other Ambulatory Visit: Payer: Self-pay

## 2024-04-21 NOTE — Telephone Encounter (Signed)
 Xray ordered. Patient aware. She went ahead and made appt to see PCP on Friday anyway and wants to keep it. Will do xray at that time.

## 2024-04-23 ENCOUNTER — Ambulatory Visit: Admitting: Nurse Practitioner

## 2024-04-23 ENCOUNTER — Encounter: Payer: Self-pay | Admitting: Nurse Practitioner

## 2024-04-23 VITALS — BP 112/62 | HR 65 | Temp 98.0°F | Ht 65.0 in | Wt 127.0 lb

## 2024-04-23 DIAGNOSIS — F3342 Major depressive disorder, recurrent, in full remission: Secondary | ICD-10-CM

## 2024-04-23 DIAGNOSIS — W19XXXD Unspecified fall, subsequent encounter: Secondary | ICD-10-CM

## 2024-04-23 MED ORDER — BUPROPION HCL ER (XL) 150 MG PO TB24: 150.0000 mg | ORAL_TABLET | Freq: Every day | ORAL | 1 refills | Status: AC

## 2024-04-23 NOTE — Patient Instructions (Signed)

## 2024-04-23 NOTE — Progress Notes (Signed)
 "  Subjective:    Patient ID: Teresa Holloway, female    DOB: 02-02-1941, 84 y.o.   MRN: [id number]   Chief Complaint:   Depression        Associated symptoms include no headaches.   Patient was actually here to get xray from follow up f a fall. She wanted verification that she didn't have a broken bone. She thinks her tail bone is broken. Slight pain when sitting. Walking without pain. BUt daughter in law this she is depressed. Has some confusion. Cries daily.      04/23/2024    9:19 AM 04/23/2024    9:14 AM 04/06/2024   11:25 AM  Depression screen PHQ 2/9  Decreased Interest 3 1 1   Down, Depressed, Hopeless 2 1 1   PHQ - 2 Score 5 2 2   Altered sleeping 0 0 0  Tired, decreased energy 2 0 0  Change in appetite 0 0 0  Feeling bad or failure about yourself  0 0 0  Trouble concentrating 2 3 3   Moving slowly or fidgety/restless 0 0 0  Suicidal thoughts 1 0 0  PHQ-9 Score 10 5 5   Difficult doing work/chores Somewhat difficult Not difficult at all Not difficult at all     Patient Active Problem List   Diagnosis Date Noted   Recurrent dislocation of right hip 06/06/2023   Failed total hip arthroplasty 06/05/2023   S/P total right hip arthroplasty 02/17/2023   Osteoarthritis of right hip 02/14/2023   Hypokalemia 09/05/2022   Chronic ischemic heart disease 02/26/2022   Anemia 02/05/2022   Constipation 10/29/2021   Helicobacter pylori gastritis 10/29/2021   Status post total left knee replacement 01/25/2019   Pain management contract agreement 02/22/2016   Opioid type dependence, continuous (HCC) 02/22/2016   BMI 30.0-30.9,adult 01/02/2015   Osteoarthritis of left hip 08/23/2013   Depression 08/23/2013   Osteoporosis 06/23/2013   Glaucoma of right eye 06/23/2013   CAD (coronary artery disease) 08/19/2012   History of MI (myocardial infarction) 08/19/2012   Arthritis of both knees 08/19/2012   Essential hypertension, benign 08/10/2012   Hyperlipidemia with target LDL less  than 100 08/10/2012   GERD (gastroesophageal reflux disease) 08/10/2012        Review of Systems  Constitutional:  Negative for diaphoresis.  Eyes:  Negative for pain.  Respiratory:  Negative for shortness of breath.   Cardiovascular:  Negative for chest pain, palpitations and leg swelling.  Gastrointestinal:  Negative for abdominal pain.  Endocrine: Negative for polydipsia.  Skin:  Negative for rash.  Neurological:  Negative for dizziness, weakness and headaches.  Hematological:  Does not bruise/bleed easily.  Psychiatric/Behavioral:  Positive for depression.   All other systems reviewed and are negative.      Objective:   Physical Exam Constitutional:      Appearance: Normal appearance.  Cardiovascular:     Rate and Rhythm: Normal rate and regular rhythm.     Heart sounds: Normal heart sounds.  Pulmonary:     Effort: Pulmonary effort is normal.     Breath sounds: Normal breath sounds.  Skin:    General: Skin is warm.  Neurological:     General: No focal deficit present.     Mental Status: She is alert and oriented to person, place, and time.  Psychiatric:        Mood and Affect: Mood normal.        Behavior: Behavior normal.     BP 112/62   Pulse  65   Temp 98 F (36.7 C) (Temporal)   Ht 5' 5 (1.651 m)   Wt 127 lb (57.6 kg)   SpO2 98%   BMI 21.13 kg/m        Assessment & Plan:   Teresa Holloway in today with chief complaint of Depression   1. Recurrent major depressive disorder, in full remission (Primary) Will start patient on wellbutrin Continue lexapro  as she has been taking Stress management - buPROPion (WELLBUTRIN XL) 150 MG 24 hr tablet; Take 1 tablet (150 mg total) by mouth daily.  Dispense: 90 tablet; Refill: 1  2. Fall, subsequent encounter Sit on donut Patient will come back for xrays- no one here today to do xrays - DG Lumbar Spine 2-3 Views; Future - DG Sacrum/Coccyx; Future    The above assessment and management plan was  discussed with the patient. The patient verbalized understanding of and has agreed to the management plan. Patient is aware to call the clinic if symptoms persist or worsen. Patient is aware when to return to the clinic for a follow-up visit. Patient educated on when it is appropriate to go to the emergency department.   Mary-Margaret Gladis, FNP   "

## 2024-04-28 ENCOUNTER — Other Ambulatory Visit

## 2024-04-28 DIAGNOSIS — W19XXXD Unspecified fall, subsequent encounter: Secondary | ICD-10-CM

## 2024-04-28 DIAGNOSIS — M545 Low back pain, unspecified: Secondary | ICD-10-CM

## 2024-05-04 NOTE — Telephone Encounter (Signed)
 Pt notified results have not finalized. LS

## 2024-05-04 NOTE — Telephone Encounter (Unsigned)
 Copied from CRM [id number]. Topic: Clinical - Lab/Test Results >> May 04, 2024 11:16 AM Rosaria BRAVO wrote: Reason for CRM: Dyonna Jaspers called for imaging results, now she is saying her right hip is hurting, cannot see her dr. Dempsey Sensor until next Tuesday  Best contact: [phone]

## 2024-05-06 ENCOUNTER — Ambulatory Visit: Payer: Self-pay | Admitting: Nurse Practitioner

## 2024-05-07 NOTE — Telephone Encounter (Signed)
 Just degenerative changes in lower spine and sacrum. Nothing can do about that. Just comes with age

## 2024-06-24 ENCOUNTER — Ambulatory Visit: Admitting: Nurse Practitioner

## 2024-08-26 ENCOUNTER — Encounter
